# Patient Record
Sex: Female | Born: 1965 | Race: Black or African American | Hispanic: No | Smoking: Former smoker
Health system: Southern US, Community
[De-identification: ages and names within clinical notes are randomized; demographics above are authoritative.]

## PROBLEM LIST (undated history)

## (undated) DIAGNOSIS — J45909 Unspecified asthma, uncomplicated: Secondary | ICD-10-CM

## (undated) DIAGNOSIS — R112 Nausea with vomiting, unspecified: Secondary | ICD-10-CM

## (undated) DIAGNOSIS — J302 Other seasonal allergic rhinitis: Secondary | ICD-10-CM

## (undated) DIAGNOSIS — G43909 Migraine, unspecified, not intractable, without status migrainosus: Secondary | ICD-10-CM

## (undated) DIAGNOSIS — Z9289 Personal history of other medical treatment: Secondary | ICD-10-CM

## (undated) DIAGNOSIS — F419 Anxiety disorder, unspecified: Secondary | ICD-10-CM

## (undated) DIAGNOSIS — L709 Acne, unspecified: Secondary | ICD-10-CM

## (undated) DIAGNOSIS — F32A Depression, unspecified: Secondary | ICD-10-CM

## (undated) DIAGNOSIS — K219 Gastro-esophageal reflux disease without esophagitis: Secondary | ICD-10-CM

## (undated) DIAGNOSIS — D649 Anemia, unspecified: Secondary | ICD-10-CM

## (undated) DIAGNOSIS — Z8489 Family history of other specified conditions: Secondary | ICD-10-CM

## (undated) DIAGNOSIS — E7800 Pure hypercholesterolemia, unspecified: Secondary | ICD-10-CM

## (undated) DIAGNOSIS — F329 Major depressive disorder, single episode, unspecified: Secondary | ICD-10-CM

## (undated) DIAGNOSIS — M889 Osteitis deformans of unspecified bone: Secondary | ICD-10-CM

## (undated) DIAGNOSIS — M797 Fibromyalgia: Secondary | ICD-10-CM

## (undated) DIAGNOSIS — I10 Essential (primary) hypertension: Secondary | ICD-10-CM

## (undated) DIAGNOSIS — J42 Unspecified chronic bronchitis: Secondary | ICD-10-CM

## (undated) DIAGNOSIS — I639 Cerebral infarction, unspecified: Secondary | ICD-10-CM

## (undated) DIAGNOSIS — L910 Hypertrophic scar: Secondary | ICD-10-CM

## (undated) DIAGNOSIS — M1990 Unspecified osteoarthritis, unspecified site: Secondary | ICD-10-CM

## (undated) DIAGNOSIS — Z9889 Other specified postprocedural states: Secondary | ICD-10-CM

## (undated) DIAGNOSIS — Z8673 Personal history of transient ischemic attack (TIA), and cerebral infarction without residual deficits: Secondary | ICD-10-CM

## (undated) HISTORY — DX: Depression, unspecified: F32.A

## (undated) HISTORY — DX: Major depressive disorder, single episode, unspecified: F32.9

## (undated) HISTORY — PX: VAGINAL HYSTERECTOMY: SHX2639

## (undated) HISTORY — DX: Fibromyalgia: M79.7

## (undated) HISTORY — PX: TONSILLECTOMY: SUR1361

## (undated) HISTORY — PX: DIAGNOSTIC LAPAROSCOPY: SUR761

---

## 2000-02-25 HISTORY — PX: KNEE ARTHROSCOPY: SHX127

## 2000-08-20 ENCOUNTER — Encounter: Payer: Self-pay | Admitting: Pulmonary Disease

## 2000-08-20 ENCOUNTER — Ambulatory Visit (HOSPITAL_COMMUNITY): Admission: RE | Admit: 2000-08-20 | Discharge: 2000-08-20 | Payer: Self-pay | Admitting: Pulmonary Disease

## 2001-11-12 ENCOUNTER — Emergency Department (HOSPITAL_COMMUNITY): Admission: EM | Admit: 2001-11-12 | Discharge: 2001-11-12 | Payer: Self-pay | Admitting: Emergency Medicine

## 2002-03-17 ENCOUNTER — Other Ambulatory Visit: Admission: RE | Admit: 2002-03-17 | Discharge: 2002-03-17 | Payer: Self-pay | Admitting: Obstetrics and Gynecology

## 2002-04-21 ENCOUNTER — Ambulatory Visit (HOSPITAL_COMMUNITY): Admission: RE | Admit: 2002-04-21 | Discharge: 2002-04-21 | Payer: Self-pay | Admitting: Obstetrics and Gynecology

## 2002-04-21 ENCOUNTER — Encounter (INDEPENDENT_AMBULATORY_CARE_PROVIDER_SITE_OTHER): Payer: Self-pay

## 2002-04-21 HISTORY — PX: HYSTEROSCOPY W/D&C: SHX1775

## 2002-04-21 HISTORY — PX: HYSTEROSCOPY WITH D & C: SHX1775

## 2003-03-22 ENCOUNTER — Other Ambulatory Visit: Admission: RE | Admit: 2003-03-22 | Discharge: 2003-03-22 | Payer: Self-pay | Admitting: Obstetrics and Gynecology

## 2003-11-01 ENCOUNTER — Inpatient Hospital Stay (HOSPITAL_COMMUNITY): Admission: RE | Admit: 2003-11-01 | Discharge: 2003-11-03 | Payer: Self-pay | Admitting: Obstetrics and Gynecology

## 2003-11-01 ENCOUNTER — Encounter (INDEPENDENT_AMBULATORY_CARE_PROVIDER_SITE_OTHER): Payer: Self-pay | Admitting: Specialist

## 2003-11-01 HISTORY — PX: TOTAL ABDOMINAL HYSTERECTOMY: SHX209

## 2004-06-25 ENCOUNTER — Encounter: Admission: RE | Admit: 2004-06-25 | Discharge: 2004-06-25 | Payer: Self-pay | Admitting: Obstetrics and Gynecology

## 2004-08-08 ENCOUNTER — Ambulatory Visit: Payer: Self-pay | Admitting: Internal Medicine

## 2004-08-15 ENCOUNTER — Ambulatory Visit: Payer: Self-pay | Admitting: Internal Medicine

## 2004-08-28 ENCOUNTER — Ambulatory Visit: Payer: Self-pay | Admitting: Internal Medicine

## 2005-01-12 ENCOUNTER — Encounter: Admission: RE | Admit: 2005-01-12 | Discharge: 2005-01-12 | Payer: Self-pay | Admitting: Obstetrics and Gynecology

## 2005-03-31 ENCOUNTER — Ambulatory Visit: Payer: Self-pay | Admitting: Internal Medicine

## 2005-05-18 ENCOUNTER — Ambulatory Visit: Payer: Self-pay | Admitting: Internal Medicine

## 2005-09-11 ENCOUNTER — Encounter: Admission: RE | Admit: 2005-09-11 | Discharge: 2005-09-11 | Payer: Self-pay | Admitting: Obstetrics and Gynecology

## 2005-09-25 ENCOUNTER — Other Ambulatory Visit: Admission: RE | Admit: 2005-09-25 | Discharge: 2005-09-25 | Payer: Self-pay | Admitting: Obstetrics and Gynecology

## 2005-11-25 ENCOUNTER — Ambulatory Visit: Payer: Self-pay | Admitting: Internal Medicine

## 2005-12-07 ENCOUNTER — Ambulatory Visit: Payer: Self-pay | Admitting: Pulmonary Disease

## 2006-04-30 ENCOUNTER — Encounter (INDEPENDENT_AMBULATORY_CARE_PROVIDER_SITE_OTHER): Payer: Self-pay | Admitting: Specialist

## 2006-04-30 ENCOUNTER — Ambulatory Visit (HOSPITAL_BASED_OUTPATIENT_CLINIC_OR_DEPARTMENT_OTHER): Admission: RE | Admit: 2006-04-30 | Discharge: 2006-04-30 | Payer: Self-pay | Admitting: Otolaryngology

## 2006-04-30 HISTORY — PX: ETHMOIDECTOMY: SHX5197

## 2006-04-30 HISTORY — PX: NASAL TURBINATE REDUCTION: SHX2072

## 2006-04-30 HISTORY — PX: FUNCTIONAL ENDOSCOPIC SINUS SURGERY: SUR616

## 2006-08-06 ENCOUNTER — Ambulatory Visit: Payer: Self-pay | Admitting: Internal Medicine

## 2006-09-06 ENCOUNTER — Ambulatory Visit: Payer: Self-pay | Admitting: Internal Medicine

## 2006-09-06 LAB — CONVERTED CEMR LAB
ALT: 21 units/L (ref 0–40)
BUN: 5 mg/dL — ABNORMAL LOW (ref 6–23)
Bilirubin, Direct: 0.1 mg/dL (ref 0.0–0.3)
CRP, High Sensitivity: 5 (ref 0.00–5.00)
Calcium: 9.2 mg/dL (ref 8.4–10.5)
Direct LDL: 141.6 mg/dL
GFR calc Af Amer: 118 mL/min
GFR calc non Af Amer: 98 mL/min
Glucose, Bld: 108 mg/dL — ABNORMAL HIGH (ref 70–99)
Hemoglobin, Urine: NEGATIVE
Ketones, ur: NEGATIVE mg/dL
Nitrite: NEGATIVE
Potassium: 3.7 meq/L (ref 3.5–5.1)
Total CHOL/HDL Ratio: 5.3
Total Protein: 7.2 g/dL (ref 6.0–8.3)
Triglycerides: 137 mg/dL (ref 0–149)
Urine Glucose: NEGATIVE mg/dL
Urobilinogen, UA: 0.2 (ref 0.0–1.0)
VLDL: 27 mg/dL (ref 0–40)
pH: 6 (ref 5.0–8.0)

## 2006-09-13 ENCOUNTER — Ambulatory Visit: Payer: Self-pay | Admitting: Internal Medicine

## 2006-10-01 ENCOUNTER — Ambulatory Visit: Payer: Self-pay | Admitting: Internal Medicine

## 2006-10-01 ENCOUNTER — Ambulatory Visit (HOSPITAL_COMMUNITY): Admission: RE | Admit: 2006-10-01 | Discharge: 2006-10-01 | Payer: Self-pay | Admitting: Internal Medicine

## 2006-10-22 ENCOUNTER — Encounter: Admission: RE | Admit: 2006-10-22 | Discharge: 2006-10-22 | Payer: Self-pay | Admitting: Orthopedic Surgery

## 2006-12-13 ENCOUNTER — Encounter: Admission: RE | Admit: 2006-12-13 | Discharge: 2006-12-13 | Payer: Self-pay | Admitting: Obstetrics and Gynecology

## 2007-05-23 ENCOUNTER — Ambulatory Visit: Payer: Self-pay | Admitting: Internal Medicine

## 2007-07-01 ENCOUNTER — Encounter: Admission: RE | Admit: 2007-07-01 | Discharge: 2007-07-01 | Payer: Self-pay | Admitting: Neurosurgery

## 2007-07-12 ENCOUNTER — Ambulatory Visit (HOSPITAL_COMMUNITY): Admission: RE | Admit: 2007-07-12 | Discharge: 2007-07-13 | Payer: Self-pay | Admitting: Neurosurgery

## 2007-07-12 HISTORY — PX: LAMINOTOMY: SHX998

## 2007-12-07 ENCOUNTER — Ambulatory Visit: Payer: Self-pay | Admitting: Internal Medicine

## 2007-12-07 DIAGNOSIS — F329 Major depressive disorder, single episode, unspecified: Secondary | ICD-10-CM

## 2007-12-07 DIAGNOSIS — R5383 Other fatigue: Secondary | ICD-10-CM

## 2007-12-07 DIAGNOSIS — J069 Acute upper respiratory infection, unspecified: Secondary | ICD-10-CM | POA: Insufficient documentation

## 2007-12-07 DIAGNOSIS — R519 Headache, unspecified: Secondary | ICD-10-CM | POA: Insufficient documentation

## 2007-12-07 DIAGNOSIS — Z87891 Personal history of nicotine dependence: Secondary | ICD-10-CM | POA: Insufficient documentation

## 2007-12-07 DIAGNOSIS — R51 Headache: Secondary | ICD-10-CM

## 2007-12-07 DIAGNOSIS — M26629 Arthralgia of temporomandibular joint, unspecified side: Secondary | ICD-10-CM

## 2007-12-07 DIAGNOSIS — R5381 Other malaise: Secondary | ICD-10-CM

## 2007-12-12 ENCOUNTER — Ambulatory Visit: Payer: Self-pay | Admitting: Internal Medicine

## 2007-12-13 ENCOUNTER — Encounter: Payer: Self-pay | Admitting: Internal Medicine

## 2007-12-14 ENCOUNTER — Encounter: Payer: Self-pay | Admitting: Internal Medicine

## 2007-12-15 ENCOUNTER — Encounter: Payer: Self-pay | Admitting: Internal Medicine

## 2007-12-20 ENCOUNTER — Telehealth: Payer: Self-pay | Admitting: Internal Medicine

## 2007-12-26 ENCOUNTER — Ambulatory Visit: Payer: Self-pay | Admitting: Internal Medicine

## 2007-12-26 DIAGNOSIS — E7800 Pure hypercholesterolemia, unspecified: Secondary | ICD-10-CM

## 2007-12-27 LAB — CONVERTED CEMR LAB
ALT: 17 units/L (ref 0–35)
Basophils Absolute: 0 10*3/uL (ref 0.0–0.1)
Basophils Relative: 0.1 % (ref 0.0–1.0)
Bilirubin, Direct: 0.1 mg/dL (ref 0.0–0.3)
CO2: 30 meq/L (ref 19–32)
Calcium: 9.3 mg/dL (ref 8.4–10.5)
Cholesterol: 224 mg/dL (ref 0–200)
Creatinine, Ser: 0.7 mg/dL (ref 0.4–1.2)
Direct LDL: 158.3 mg/dL
Eosinophils Absolute: 0.1 10*3/uL (ref 0.0–0.7)
GFR calc Af Amer: 117 mL/min
HCT: 36.6 % (ref 36.0–46.0)
Hemoglobin: 12.5 g/dL (ref 12.0–15.0)
MCHC: 34.3 g/dL (ref 30.0–36.0)
MCV: 85.4 fL (ref 78.0–100.0)
Monocytes Absolute: 0.6 10*3/uL (ref 0.1–1.0)
Neutro Abs: 3.6 10*3/uL (ref 1.4–7.7)
RBC: 4.28 M/uL (ref 3.87–5.11)
RDW: 15.5 % — ABNORMAL HIGH (ref 11.5–14.6)
Sodium: 141 meq/L (ref 135–145)
TSH: 0.7 microintl units/mL (ref 0.35–5.50)
Total Bilirubin: 0.7 mg/dL (ref 0.3–1.2)
VLDL: 21 mg/dL (ref 0–40)

## 2008-03-27 ENCOUNTER — Encounter: Payer: Self-pay | Admitting: Internal Medicine

## 2009-03-05 ENCOUNTER — Encounter: Payer: Self-pay | Admitting: Internal Medicine

## 2009-08-20 ENCOUNTER — Ambulatory Visit: Payer: Self-pay | Admitting: Internal Medicine

## 2009-08-20 ENCOUNTER — Encounter: Admission: RE | Admit: 2009-08-20 | Discharge: 2009-08-20 | Payer: Self-pay | Admitting: Internal Medicine

## 2009-08-20 DIAGNOSIS — R109 Unspecified abdominal pain: Secondary | ICD-10-CM | POA: Insufficient documentation

## 2009-08-20 DIAGNOSIS — K219 Gastro-esophageal reflux disease without esophagitis: Secondary | ICD-10-CM

## 2009-08-21 LAB — CONVERTED CEMR LAB
ALT: 112 units/L — ABNORMAL HIGH (ref 0–35)
Albumin: 3.9 g/dL (ref 3.5–5.2)
Alkaline Phosphatase: 112 units/L (ref 39–117)
Basophils Relative: 0.5 % (ref 0.0–3.0)
Bilirubin Urine: NEGATIVE
Bilirubin, Direct: 0.1 mg/dL (ref 0.0–0.3)
CO2: 30 meq/L (ref 19–32)
Calcium: 9.3 mg/dL (ref 8.4–10.5)
Chloride: 107 meq/L (ref 96–112)
Creatinine, Ser: 0.8 mg/dL (ref 0.4–1.2)
Eosinophils Relative: 1.1 % (ref 0.0–5.0)
Hemoglobin, Urine: NEGATIVE
Hemoglobin: 12.5 g/dL (ref 12.0–15.0)
Ketones, ur: NEGATIVE mg/dL
Leukocytes, UA: NEGATIVE
Lymphocytes Relative: 38.3 % (ref 12.0–46.0)
MCV: 89.3 fL (ref 78.0–100.0)
Neutro Abs: 2.7 10*3/uL (ref 1.4–7.7)
Neutrophils Relative %: 51.6 % (ref 43.0–77.0)
RBC: 4.04 M/uL (ref 3.87–5.11)
Sodium: 141 meq/L (ref 135–145)
Total Protein: 6.9 g/dL (ref 6.0–8.3)
Urine Glucose: NEGATIVE mg/dL
Urobilinogen, UA: 0.2 (ref 0.0–1.0)
WBC: 5.2 10*3/uL (ref 4.5–10.5)

## 2009-08-27 ENCOUNTER — Observation Stay (HOSPITAL_COMMUNITY): Admission: RE | Admit: 2009-08-27 | Discharge: 2009-08-28 | Payer: Self-pay | Admitting: Surgery

## 2009-08-27 ENCOUNTER — Encounter (INDEPENDENT_AMBULATORY_CARE_PROVIDER_SITE_OTHER): Payer: Self-pay | Admitting: Surgery

## 2009-08-27 HISTORY — PX: CHOLECYSTECTOMY: SHX55

## 2010-02-17 ENCOUNTER — Ambulatory Visit: Payer: Self-pay | Admitting: Internal Medicine

## 2010-02-17 DIAGNOSIS — S335XXA Sprain of ligaments of lumbar spine, initial encounter: Secondary | ICD-10-CM

## 2010-02-26 ENCOUNTER — Emergency Department (HOSPITAL_COMMUNITY): Admission: EM | Admit: 2010-02-26 | Discharge: 2010-02-27 | Payer: Self-pay | Admitting: Emergency Medicine

## 2010-03-05 ENCOUNTER — Telehealth: Payer: Self-pay | Admitting: Internal Medicine

## 2010-03-05 ENCOUNTER — Ambulatory Visit: Payer: Self-pay | Admitting: Internal Medicine

## 2010-03-05 ENCOUNTER — Telehealth (INDEPENDENT_AMBULATORY_CARE_PROVIDER_SITE_OTHER): Payer: Self-pay | Admitting: *Deleted

## 2010-03-06 ENCOUNTER — Ambulatory Visit (HOSPITAL_COMMUNITY): Admission: RE | Admit: 2010-03-06 | Discharge: 2010-03-06 | Payer: Self-pay | Admitting: Internal Medicine

## 2010-03-27 ENCOUNTER — Ambulatory Visit (HOSPITAL_COMMUNITY): Admission: RE | Admit: 2010-03-27 | Discharge: 2010-03-28 | Payer: Self-pay | Admitting: Neurosurgery

## 2010-03-27 HISTORY — PX: LAMINOTOMY: SHX998

## 2010-04-28 ENCOUNTER — Ambulatory Visit: Payer: Self-pay | Admitting: Internal Medicine

## 2010-04-28 DIAGNOSIS — J310 Chronic rhinitis: Secondary | ICD-10-CM | POA: Insufficient documentation

## 2010-05-04 ENCOUNTER — Inpatient Hospital Stay (HOSPITAL_COMMUNITY): Admission: EM | Admit: 2010-05-04 | Discharge: 2010-05-08 | Payer: Self-pay | Admitting: Emergency Medicine

## 2010-05-13 ENCOUNTER — Encounter: Admission: RE | Admit: 2010-05-13 | Discharge: 2010-05-13 | Payer: Self-pay | Admitting: Neurosurgery

## 2010-05-16 ENCOUNTER — Encounter: Admission: RE | Admit: 2010-05-16 | Discharge: 2010-05-16 | Payer: Self-pay | Admitting: Neurosurgery

## 2010-05-22 ENCOUNTER — Encounter: Admission: RE | Admit: 2010-05-22 | Discharge: 2010-05-22 | Payer: Self-pay | Admitting: Neurosurgery

## 2010-06-10 ENCOUNTER — Encounter: Admission: RE | Admit: 2010-06-10 | Discharge: 2010-06-10 | Payer: Self-pay | Admitting: Neurosurgery

## 2010-06-27 ENCOUNTER — Encounter: Admission: RE | Admit: 2010-06-27 | Discharge: 2010-06-27 | Payer: Self-pay | Admitting: Neurosurgery

## 2010-07-18 ENCOUNTER — Ambulatory Visit (HOSPITAL_COMMUNITY)
Admission: RE | Admit: 2010-07-18 | Discharge: 2010-07-21 | Payer: Self-pay | Source: Home / Self Care | Attending: Neurosurgery | Admitting: Neurosurgery

## 2010-07-18 HISTORY — PX: LUMBAR FUSION: SHX111

## 2010-08-21 ENCOUNTER — Other Ambulatory Visit: Payer: Self-pay | Admitting: Obstetrics and Gynecology

## 2010-08-28 NOTE — Assessment & Plan Note (Signed)
Summary: Primary svc/ new onset RUQ Pain    Primary Provider/Referring Provider:  Sherene Sires  CC:  Acute visit.  Pt c/o sharp pain under right breast x 2 wks.  She states that the pain radiates around to her back and shoulder blade.  She states that the pain usually only occurs at night.  Pain worsens when she takes a deep breath.  She also c/o nausea and increased acid reflux symptoms..  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    August 20, 2009 Acute visit.  Pt c/o continuous sharp pain under right breast x 2 wks.  She states that the pain radiates around to her back and shoulder blade.  She states that the pain worse lying down.  Pain worsens when she takes a deep breath.  She also c/o nausea and increased acid reflux symptoms. No cough.   Pain worse with greasy foods, stools less brown. Pt denies any significant sore throat, dysphagia, itching, sneezing,  nasal congestion or excess secretions,  fever, chills, sweats, unintended wt loss, pleuritic or exertional cp, hempoptysis, change in activity tolerance  orthopnea pnd or leg swelling. Pt also denies any obvious fluctuation in symptoms with weather or environmental change or other alleviating or aggravating factors.             Current Medications (verified): 1)  Omeprazole 20 Mg  Cpdr (Omeprazole) .... Take One 30-60 Min Before First and Last Meals of The Day Until Better 2)  Tramadol Hcl 50 Mg  Tabs (Tramadol Hcl) .... One To Two By Mouth Every 4-6 Hours 3)  Zyrtec Allergy 10 Mg  Tabs (Cetirizine Hcl) .... Once Daily As Needed 4)  Paxil 20 Mg Tabs (Paroxetine Hcl) .Marland Kitchen.. 1 Once Daily 5)  Estrace 1 Mg Tabs (Estradiol) .Marland Kitchen.. 1 Once Daily  Allergies (verified): 1)  ! Tylox  Past History:  Past Medical History: DEPRESSION (ICD-311) FATIGUE (ICD-780.79) HEADACHE, CHRONIC  (ICD-784.0) Dr. Madelon Lips follow-up TOBACCO ABUSE, HX OF (ICD-V15.82) quit smoking 2005 TMJ PAIN (ICD-524.62) OBESITY (ICD-278.00)  peak 233 8/08  target 179  ideal 161 Sinusitis sinus surgery 10/07 Dr. Narda Bonds RUQ Pain    - U/S ABD August 20, 2009 >     Family History: suicide in her father depression in her mother ovarian cancer in maternal grandmother hypertension and CHF in brother 47 y younger gallstones mother  Social History: Quit smoking Dec 2004 2 children living at home  Vital Signs:  Patient profile:   45 year old female Weight:      219 pounds BMI:     35.48 O2 Sat:      97 % on Room air Temp:     99.0 degrees F oral Pulse rate:   102 / minute BP sitting:   114 / 72  (right arm)  Vitals Entered By: Vernie Murders (August 20, 2009 10:25 AM)  O2 Flow:  Room air  Physical Exam  Additional Exam:  Obese amb bf nad, somber with hopeless/helpless affect wt 218 > 219 August 20, 2009  HEENT: nl dentition, turbinates, and orophanx. Nl external ear canals without cough reflex Neck without JVD/Nodes/TM  normal carotids without bruit Lungs clear to A and P bilaterally without cough on insp or exp maneuvers RRR no s3 or murmur or increase in P2 no displacement of PMI Abd  mild ruq tenderness plus/minus murphy's  no guarding or rebound Ext warm without calf tenderness, cyanosis clubbing or edema Skin warm and dry without lesions  except for small keloid like area under chin  Abd u/s 1.  Cholelithiasis.  Positive sonographic Murphy's sign. 2.  Echogenic pancreas which may reflect diffuse fatty infiltration.  No focal pancreatic parenchymal abnormality. 3.  Otherwise normal abdominal ultrasound.    CXR  Procedure date:  08/20/2009  Findings:       Comparison: Chest x-ray of 12/26/2007   Findings: The lungs are clear.  Mediastinal contours are normal. The heart is within upper limits of normal.  No acute bony abnormality is seen.   IMPRESSION: Stable  chest x-ray.  No active lung disease.  Impression & Recommendations:  Problem # 1:  ABDOMINAL PAIN, UNSPECIFIED (ICD-789.00) Multiple risk factors for gb dz, pursue dx with u/s > positive gallstones with murphy's sign, will refer to General surgery  Orders: Est. Patient Level IV ([address]) TLB-BMP (Basic Metabolic Panel-BMET) (80048-METABOL) TLB-CBC Platelet - w/Differential (85025-CBCD) TLB-Hepatic/Liver Function Pnl (80076-HEPATIC) TLB-Udip ONLY (81003-UDIP) Misc. Referral (Misc. Ref) T-2 View CXR (71020TC) Surgical Referral (Surgery)  Problem # 2:  GERD (ICD-530.81)  updated medication list for this problem includes:    Omeprazole 20 Mg Cpdr (Omeprazole) .Marland Kitchen... Take one 30-60 min before first and last meals of the day until better whenever having any abd or respiratory flares.  Medications Added to Medication List This Visit: 1)  Tramadol Hcl 50 Mg Tabs (Tramadol hcl) .... One to two by mouth every 4-6 hours for pain 2)  Paxil 20 Mg Tabs (Paroxetine hcl) .Marland Kitchen.. 1 once daily 3)  Estrace 1 Mg Tabs (Estradiol) .Marland Kitchen.. 1 once daily  Patient Instructions: 1)  Prilosec Take one 30-60 min before first and last meals of the day whenever coughing or abdominal promblems 2)  GERD (REFLUX)  is a common cause of respiratory symptoms. It commonly presents without heartburn and can be treated with medication, but also with lifestyle changes including avoidance of late meals, excessive alcohol, smoking cessation, and avoid fatty foods, chocolate, peppermint, colas, red wine, and acidic juices such as orange juice. NO MINT OR MENTHOL PRODUCTS SO NO COUGH DROPS  3)  USE SUGARLESS CANDY INSTEAD (jolley ranchers)  4)  NO OIL BASED VITAMINS 5)  No greasy foods 6)  See Patient Care Coordinator before leaving for for abd ultrasound Prescriptions: TRAMADOL HCL 50 MG  TABS (TRAMADOL HCL) One to two by mouth every 4-6 hours for pain  #40 x 0   Entered and Authorized by:   Nyoka Cowden MD   Signed by:   Nyoka Cowden MD on 08/20/2009   Method used:   Electronically to        [facility]  [phone]* (retail)       [address]       [place], [place]  [address]       Ph: [phone] or [phone]       Fax: [phone]   RxID:   [id number]

## 2010-08-28 NOTE — Progress Notes (Signed)
Summary: pharm call  Phone Note From Pharmacy Call back at [phone]   Caller: [facility]- [place] Call For: Wanda [name]  Summary of Call: need to verify Mobic rx instructions and that pt needs Susp. Initial call taken by: Eugene Gavia,  March 05, 2010 10:04 AM  Follow-up for Phone Call        Spoke with [name].  needs clarrification on Mobic rx -- ? if this is supposed to be susp or pill.  If susp, pls advise on mL.  If pill, pls advise strength.  Will forward to MW--pls advise.  Thanks! Follow-up by: Gweneth Dimitri RN,  March 05, 2010 10:30 AM  Additional Follow-up for Phone Call Additional follow up Details #1::        sorry it was meant to be rx  for the tablet  Additional Follow-up by: Nyoka Cowden MD,  March 05, 2010 12:41 PM    Additional Follow-up for Phone Call Additional follow up Details #2::    update with pharmacy.Reynaldo Minium CMA  March 05, 2010 2:25 PM

## 2010-08-28 NOTE — Progress Notes (Signed)
Summary: handicap parking note > ok  Phone Note Call from Patient   Caller: Patient Call For: wert Summary of Call: pt was just seen. forget to request a note that she can take to get a handicap placard for parking. call [phone] and she will pick this up Initial call taken by: Tivis Ringer, CNA,  March 05, 2010 9:59 AM  Follow-up for Phone Call        Dr. Sherene Sires, pls advise if ok for pt to have a handicap placard.  Gweneth Dimitri RN  March 05, 2010 10:33 AM ok Follow-up by: Nyoka Cowden MD,  March 05, 2010 12:41 PM  Additional Follow-up for Phone Call Additional follow up Details #1::        Message and papers are in MW's lookat to sign and then call pt to pick up.Reynaldo Minium CMA  March 05, 2010 2:24 PM     Additional Follow-up for Phone Call Additional follow up Details #2::    done Follow-up by: Nyoka Cowden MD,  March 05, 2010 4:03 PM

## 2010-08-28 NOTE — Assessment & Plan Note (Signed)
Summary: Primary svc/ recurrent cough   Primary Provider/Referring Provider:  Sherene Sires  CC:  Acute visit.  Pt c/o cough x 2 days- dry and hacky.  She states that this all started with nasal congestion.  C/o back pain from cough..  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    August 20, 2009 Acute visit.  Pt c/o continuous sharp pain under right breast x 2 wks.  She states that the pain radiates around to her back and shoulder blade.  She states that the pain worse lying down.  Pain worsens when she takes a deep breath.  She also c/o nausea and increased acid reflux symptoms. No cough.   Pain worse with greasy foods, stools less brown.   GBectomy 08/28/09  February 17, 2010 Acute visit.  Pt c/o back pain x 2 days- started when she turned to get out of the car.  Pain is mainly in her lower back- rt side- tramadol for pain not helping.  acute onset while sitting and twisting before standing up no radicular features. no dysuria, fever. no better with advil rec Heating pad Mobic 7.5 twice daily with meals for a week then take only as needed (like advil so don't use over the counters) Percocet one every 4 hours if need for severe pain > right sided resolved then after mobic stopped began having pain down left side to toes and numbness > ER 8/3 rx valium / percocet   March 05, 2010 Acute visit.  Pt c/o back pain worsening x 1 wk now shifted She states pain has moved to her left side- radiates down left leg with numbness. She states there is no feeling in left leg.  worse with cough. no fever or abd pain.  still ambulating but using a cane. has referral to NS for 8/15  03/27/10 Back surgery [place]  April 28, 2010 Acute visit.  Pt c/o cough x 2 days- dry and hacky.  She states that this all started with nasal congestion.  C/o back pain from cough.  not using nasonex but did start afrin.  no purulent secretions. Pt denies any significant sore throat, dysphagia, itching, sneezing, fever, chills, sweats, unintended wt loss, pleuritic or exertional cp, hempoptysis, change in activity tolerance  orthopnea pnd or leg swelling.  Current Medications (verified): 1)  Omeprazole 20 Mg  Cpdr (Omeprazole) .... Take One 30-60 Min Before First and Last Meals of The Day Until Better 2)  Tramadol Hcl 50 Mg  Tabs (Tramadol Hcl) .... One To Two By Mouth Every 4-6 Hours For Pain 3)  Zyrtec Allergy 10 Mg  Tabs (Cetirizine Hcl) .... Once Daily As Needed 4)  Paxil 10 Mg Tabs (Paroxetine Hcl) .Marland Kitchen.. 1 Once Daily 5)  Estrace 1 Mg Tabs (Estradiol) .Marland Kitchen.. 1 Once Daily 6)  Relefen 500 Mg .Marland Kitchen.. 1 Two Times A Day 7)  Dilaudid 2 Mg Tabs (Hydromorphone Hcl) .Marland Kitchen.. 1 Once Daily As Needed 8)  Afrin Nasal Spray 0.05 % Soln (Oxymetazoline Hcl) .... Per Bottle Instructions- Do Not Use More Than 5 Days in A Row  Allergies: 1)  ! Tylox 2)  ! Percocet  Past History:  Past Medical History: DEPRESSION (ICD-311) FATIGUE (ICD-780.79) HEADACHE, CHRONIC (ICD-784.0) Dr. Madelon Lips follow-up TOBACCO ABUSE, HX OF (ICD-V15.82) quit smoking 2005 TMJ PAIN (ICD-524.62) OBESITY (ICD-278.00)  peak 233 8/08  target 179  ideal 161 Sinusitis sinus surgery 10/07 Dr. Narda Bonds RUQ Pain    - U/S ABD August 20, 2009 > pos gallstones > Lap chole 08/28/09 Low Back strain 02/16/10 ...................................................................Marland KitchenKritzer    - Radicular features Left began 8/2 sudden onset > rx prednisone dose pack March 05, 2010     - MRI ordered March 05, 2010>>> Pos for nerve compression    - NS eval scheduled for 03/10/10 > Laminectomy 03/27/10 Health Maintaince.....................................................................Marland KitchenSherene Sires     - CPX May 26 2010 due    Past Surgical History: 4/05status post total abdominal hysterectomy but still has ovaries back surgery Dec 08 and  03/27/10  Lap chole 08/28/09  Vital Signs:  Patient profile:   45 year old female Weight:      222.38 pounds O2 Sat:      99 % on Room air Temp:     98.0 degrees F oral Pulse rate:   102 / minute BP sitting:   130 / 84  (left arm) Cuff size:   large  Vitals Entered By: Vernie Murders (April 28, 2010 2:43 PM)  O2 Flow:  Room air  Physical Exam  Additional Exam:  Obese amb bf nad, somber with hopeless/helpless affect wt 218 > 219 August 20, 2009 > 218 February 17, 2010 > 219 March 05, 2010 >  222 April 29, 2010  HEENT: nl dentition,mod bil nonspecific swelling of both  turbinates, and nl  orophanx. Nl external ear canals without cough reflex Neck without JVD/Nodes/TM  normal carotids without bruit Lungs clear to A and P bilaterally without cough on insp or exp maneuvers RRR no s3 or murmur or increase in P2 no displacement of PMI Abd  mild ruq tenderness plus/minus murphy's no guarding or rebound Ext warm without calf tenderness, cyanosis clubbing or edema Skin warm and dry without lesions  except for small keloid like area under chin    Impression & Recommendations:  Problem # 1:  URI, ACUTE (ICD-465.9)  The following medications were removed from the medication list:    Mobic 7.5 Mg/30ml Susp (Meloxicam) ..... One twice daily x 2 weeks then as needed Her updated medication list for this problem includes:    Zyrtec Allergy 10 Mg Tabs (Cetirizine hcl) ..... Once daily as needed  Explained natural h/o uri and GERD/ cyclical cough  and why it's necessary in patients at risk to rx short term with PPI to reduce risk of evolving cyclical cough triggered by epithelial injury and a heightened sensitivty to the effects of any upper airway irritants,  most importantly acid - related    Problem # 2:  CHRONIC RHINITIS (ICD-472.0)  See instructions for specific recommendations   Orders: Est. Patient Level III ([address])  Medications Added to Medication List This Visit: 1)  Paxil 10 Mg  Tabs (Paroxetine hcl) .Marland Kitchen.. 1 once daily 2)  Relefen 500 Mg  .Marland Kitchen.. 1 two times a day 3)  Dilaudid 2 Mg Tabs (Hydromorphone hcl) .Marland Kitchen.. 1 once daily as needed 4)  Afrin Nasal Spray 0.05 % Soln (Oxymetazoline hcl) .... Per bottle instructions- do not use more than 5 days in a row 5)  Nasonex 50 Mcg/act Susp (Mometasone furoate) .... Two puffs each nostril twice daily 6)  Prednisone 10 Mg Tabs (Prednisone) .... 4 each am x 2days, 2x2days, 1x2days and stop 7)  Tramadol Hcl 50 Mg Tabs (Tramadol hcl) .... One to two by mouth every 4-6 hours if needed  Patient Instructions: 1)  Nasonex has  no immediate benefit in terms of improving symptoms.  To help them reached the target tissue, the patient should use Afrin two puffs every 12 hours applied one min before using the nasal steroids.  Afrin should be stopped after no more than 5 days.  If the symptoms worsen, Afrin can be restarted after 5 days off of therapy to prevent rebound congestion from overuse of Afrin.  I also emphasized that in no way are nasal steroids a concern in terms of "addiction". 2)   Take delsym two tsp every 12 hours and add tramadol 50 mg up to every 4 hours to suppress the urge to cough. Swallowing water or using ice chips/non mint and menthol containing candies (such as lifesavers or sugarless jolly ranchers) are also effective.  3)  GERD (REFLUX)  is a common cause of respiratory symptoms. It commonly presents without heartburn and can be treated with medication, but also with lifestyle changes including avoidance of late meals, excessive alcohol, smoking cessation, and avoid fatty foods, chocolate, peppermint, colas, red wine, and acidic juices such as orange juice. NO MINT OR MENTHOL PRODUCTS SO NO COUGH DROPS  4)  USE SUGARLESS CANDY INSTEAD (jolley ranchers)  5)  NO OIL BASED VITAMINS  6)  Prednisone 4 each am x 2days, 2x2days, 1x2days and stop  Prescriptions: TRAMADOL HCL 50 MG  TABS (TRAMADOL HCL) One to two by mouth every 4-6  hours if needed  #40 x 0   Entered and Authorized by:   Nyoka Cowden MD   Signed by:   Nyoka Cowden MD on 04/28/2010   Method used:   [facility]        [facility]       [address]       [place], [place]  [address]       Ph: [phone]       Fax: [phone]   RxID:   [id number] PREDNISONE 10 MG  TABS (PREDNISONE) 4 each am x 2days, 2x2days, 1x2days and stop  #14 x 0   Entered and Authorized by:   Nyoka Cowden MD   Signed by:   Nyoka Cowden MD on 04/28/2010   Method used:   Electronically to        [facility] (retail)       [address]       [place], [place]  [address]       Ph: [phone]       Fax: [phone]   RxID:   [id number] NASONEX 50 MCG/ACT  SUSP (MOMETASONE FUROATE) Two puffs each nostril twice daily  #1 x 11   Entered and Authorized by:   Nyoka Cowden MD   Signed by:   Nyoka Cowden MD on 04/28/2010   Method used:   Electronically to        [facility] (retail)       [address]       [place], [place]  [address]       Ph: [phone]       Fax: [phone]   RxID:   [id number]

## 2010-08-28 NOTE — Assessment & Plan Note (Signed)
Summary: Primary svc/ low back pain without radicular features   Primary Provider/Referring Provider:  Sherene Sires  CC:  Acute visit.  Pt c/o back pain x 2 days- started when she turned to get out of the car.  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    August 20, 2009 Acute visit.  Pt c/o continuous sharp pain under right breast x 2 wks.  She states that the pain radiates around to her back and shoulder blade.  She states that the pain worse lying down.  Pain worsens when she takes a deep breath.  She also c/o nausea and increased acid reflux symptoms. No cough.   Pain worse with greasy foods, stools less brown.   GBectory 08/28/09  February 17, 2010 Acute visit.  Pt c/o back pain x 2 days- started when she turned to get out of the car.  Pain is mainly in her lower back- rt side- tramadol for pain not helping.  acute onset while sitting and twisting before standing up no radicular features. no dysuria, fever. no better with advil  Current Medications (verified): 1)  Omeprazole 20 Mg  Cpdr (Omeprazole) .... Take One 30-60 Min Before First and Last Meals of The Day Until Better 2)  Tramadol Hcl 50 Mg  Tabs (Tramadol Hcl) .... One To Two By Mouth Every 4-6 Hours For Pain 3)  Zyrtec Allergy 10 Mg  Tabs (Cetirizine Hcl) .... Once Daily As Needed 4)  Paxil 20 Mg Tabs (Paroxetine Hcl) .Marland Kitchen.. 1 Once Daily 5)  Estrace 1 Mg Tabs (Estradiol) .Marland Kitchen.. 1 Once Daily  Allergies (verified): 1)  ! Tylox  Past History:  Past Medical History: DEPRESSION (ICD-311) FATIGUE (ICD-780.79) HEADACHE, CHRONIC (ICD-784.0) Dr. Madelon Lips follow-up TOBACCO ABUSE, HX OF (ICD-V15.82) quit smoking 2005 TMJ PAIN (ICD-524.62) OBESITY (ICD-278.00)  peak 233 8/08  target 179  ideal 161 Sinusitis sinus  surgery 10/07 Dr. Narda Bonds RUQ Pain    - U/S ABD August 20, 2009 > pos gallstones > Lap chole 08/28/09 Low Back strain 02/16/10    Vital Signs:  Patient profile:   45 year old female Weight:      218 pounds O2 Sat:      98 % on Room air Temp:     97.8 degrees F oral Pulse rate:   87 / minute BP sitting:   110 / 78  (left arm)  Vitals Entered By: Vernie Murders (February 17, 2010 12:04 PM)  O2 Flow:  Room air  Physical Exam  Additional Exam:  Obese amb bf nad, somber with hopeless/helpless affect wt 218 > 219 August 20, 2009 > 218 February 17, 2010  HEENT: nl dentition, turbinates, and orophanx. Nl external ear canals without cough reflex Neck without JVD/Nodes/TM  normal carotids without bruit Lungs clear to A and P bilaterally without cough on insp or exp maneuvers RRR no s3 or murmur or increase in P2 no displacement of PMI Abd  mild ruq tenderness plus/minus murphy's no guarding or rebound Ext warm without calf tenderness, cyanosis clubbing or edema Skin warm and dry without lesions  except for small keloid like area under chin MS Pos bilateral parasacral tenderness, worse pain with L SLR maneuver    Impression & Recommendations:  Problem # 1:  LUMBAR SPRAIN AND  STRAIN (ICD-847.2)  no radicular features. See instructions for specific recommendations   f/u ortho eval as needed depending on response to rx  Medications Added to Medication List This Visit: 1)  Mobic 7.5 Mg Tabs (Meloxicam) .... One twice daily x 1 week with means then use as neede for stiffness/ joint pain 2)  Percocet 10-650 Mg Tabs (Oxycodone-acetaminophen) .... One every 4 hours if need for back pain  Other Orders: Est. Patient Level III ([address])  Patient Instructions: 1)  Heating pad 2)  Mobic 7.5 twice daily with meals for a week then take only as needed (like advil so don't use over the counters) 3)  Percocet one every 4 hours if need for severe pain Prescriptions: PERCOCET 10-650 MG TABS  (OXYCODONE-ACETAMINOPHEN) one every 4 hours if need for back pain  #30 x 0   Entered and Authorized by:   Nyoka Cowden MD   Signed by:   Nyoka Cowden MD on 02/17/2010   Method used:   Print then Give to Patient   RxID:   [id number] MOBIC 7.5 MG TABS (MELOXICAM) one twice daily x 1 week with means then use as neede for stiffness/ joint pain  #30 x 11   Entered and Authorized by:   Nyoka Cowden MD   Signed by:   Nyoka Cowden MD on 02/17/2010   Method used:   Electronically to        [facility]  [phone]* (retail)       [address]       [place], [place]  [address]       Ph: [phone] or [phone]       Fax: [phone]   RxID:   [id number]

## 2010-08-28 NOTE — Assessment & Plan Note (Signed)
Summary: Primary svc/ L5/S1 radiculopathy on L > rx pred/mobic/ MRI   Primary Provider/Referring Provider:  Sherene Sires  CC:  Acute visit.  Pt c/o back pain worsening x 1 wk.  She states pain has moved to her left side- radiates down left leg with numbness. She states there is no feeling in left leg. Wanda [name]  History of Present Illness:  45 year-old black female, quit smoking 2005,  with a history of recurrent cyclical cough associated with sinus disease, status post most recent sinus surgery October, 2007.    August 20, 2009 Acute visit.  Pt c/o continuous sharp pain under right breast x 2 wks.  She states that the pain radiates around to her back and shoulder blade.  She states that the pain worse lying down.  Pain worsens when she takes a deep breath.  She also c/o nausea and increased acid reflux symptoms. No cough.   Pain worse with greasy foods, stools less brown.   GBectory 08/28/09  February 17, 2010 Acute visit.  Pt c/o back pain x 2 days- started when she turned to get out of the car.  Pain is mainly in her lower back- rt side- tramadol for pain not helping.  acute onset while sitting and twisting before standing up no radicular features. no dysuria, fever. no better with advil rec Heating pad Mobic 7.5 twice daily with meals for a week then take only as needed (like advil so don't use over the counters) Percocet one every 4 hours if need for severe pain > right sided resolved then after mobic stopped began having pain down left side to toes and numbness > ER 8/3 rx valium / percocet   March 05, 2010 Acute visit.  Pt c/o back pain worsening x 1 wk now shifted She states pain has moved to her left side- radiates down left leg with numbness. She states there is no feeling in left leg.  worse with cough. no fever or abd pain.  still ambulating but using a cane. has referral to NS for 8/15  Current Medications (verified): 1)  Omeprazole 20 Mg  Cpdr (Omeprazole) .... Take One 30-60 Min Before First and  Last Meals of The Day Until Better 2)  Tramadol Hcl 50 Mg  Tabs (Tramadol Hcl) .... One To Two By Mouth Every 4-6 Hours For Pain 3)  Zyrtec Allergy 10 Mg  Tabs (Cetirizine Hcl) .... Once Daily As Needed 4)  Paxil 20 Mg Tabs (Paroxetine Hcl) .Wanda [name].. 1 Once Daily 5)  Estrace 1 Mg Tabs (Estradiol) .Wanda [name].. 1 Once Daily  Allergies (verified): 1)  ! Tylox  Past History:  Past Medical History: DEPRESSION (ICD-311) FATIGUE (ICD-780.79) HEADACHE, CHRONIC (ICD-784.0) Dr. Madelon Lips follow-up TOBACCO ABUSE, HX OF (ICD-V15.82) quit smoking 2005 TMJ PAIN (ICD-524.62) OBESITY (ICD-278.00)  peak 233 8/08  target 179  ideal 161 Sinusitis sinus surgery 10/07 Dr. Narda Bonds RUQ Pain    - U/S ABD August 20, 2009 > pos gallstones > Lap chole 08/28/09 Low Back strain 02/16/10 ...................................................................Wanda KitchenKritzer    - Radicular features Left began 8/2 sudden onset > rx prednisone dose pack March 05, 2010     - MRI ordered March 05, 2010>>>    - NS eval scheduled for 03/10/10    Vital Signs:  Patient profile:   45 year old female Weight:      219.50 pounds O2 Sat:      97 % on Room air Temp:     97.7 degrees F oral Pulse rate:  102 / minute BP sitting:   132 / 84  (left arm)  Vitals Entered By: Vernie Murders (March 05, 2010 9:03 AM)  O2 Flow:  Room air  Physical Exam  Additional Exam:  Obese amb bf nad, somber with hopeless/helpless affect wt 218 > 219 August 20, 2009 > 218 February 17, 2010 > 219 March 05, 2010  HEENT: nl dentition, turbinates, and orophanx. Nl external ear canals without cough reflex Neck without JVD/Nodes/TM  normal carotids without bruit Lungs clear to A and P bilaterally without cough on insp or exp maneuvers RRR no s3 or murmur or increase in P2 no displacement of PMI Abd  mild ruq tenderness plus/minus murphy's no guarding or rebound Ext warm without calf tenderness, cyanosis clubbing or edema Skin warm and dry without lesions  except  for small keloid like area under chin MS Pos bilateral parasacral tenderness,  much worse pain with L SLR maneuver  Unable to bear full wt on left leg for toe/ heel walking.     Impression & Recommendations:  Problem # 1:  LUMBAR SPRAIN AND STRAIN (ICD-847.2) Now has radiuclar features Lumbar disc L5/S1 on Left which flared after finished  mobic and since then just treating the pain with opioids which I strongly discouraged.  Therefore needs MRI/ NS eval  See instructions for specific recommendations   Medications Added to Medication List This Visit: 1)  Tramadol Hcl 50 Mg Tabs (Tramadol hcl) .... One to two by mouth every 4-6 hours for pain 2)  Mobic 7.5 Mg/19ml Susp (Meloxicam) .... One twice daily x 2 weeks then as needed 3)  Prednisone 10 Mg Tabs (Prednisone) .... 4 each am x 2days, 2x2days, 1x2days and stop  Other Orders: Misc. Referral (Misc. Ref) Est. Patient Level IV (16109)  Patient Instructions: 1)  Prednisone 4 each am x 2days, 2x2days, 1x2days and stop  2)  Mobic (meloxicam) twice daily up to 2 weeks as long as still requiring pain medicaiton 3)  for pain tramadol up to 2 every 4 hours 4)  See Patient Care Coordinator before leaving for MRI of Spine Prescriptions: TRAMADOL HCL 50 MG  TABS (TRAMADOL HCL) One to two by mouth every 4-6 hours for pain  #40 x 0   Entered and Authorized by:   Nyoka Cowden MD   Signed by:   Nyoka Cowden MD on 03/05/2010   Method used:   Electronically to        [facility]  [phone]* (retail)       [address]       [place], [place]  [address]       Ph: [phone] or [phone]       Fax: [phone]   RxID:   [id number] PREDNISONE 10 MG  TABS (PREDNISONE) 4 each am x 2days, 2x2days, 1x2days and stop  #14 x 0   Entered and Authorized by:   Nyoka Cowden MD   Signed by:   Nyoka Cowden MD on 03/05/2010   Method used:   Electronically to        [facility]  [phone]*  (retail)       [address]       [place], [place]  [address]       Ph: [phone] or [phone]       Fax: [phone]   RxID:   [id number] MOBIC 7.5  MG/5ML SUSP (MELOXICAM) one twice daily x 2 weeks then as needed  #30 x 3   Entered and Authorized by:   Nyoka Cowden MD   Signed by:   Nyoka Cowden MD on 03/05/2010   Method used:   Electronically to        [facility]  [phone]* (retail)       [address]       [place], [place]  [address]       Ph: [phone] or [phone]       Fax: [phone]   RxID:   [id number]

## 2010-09-01 ENCOUNTER — Ambulatory Visit
Admission: RE | Admit: 2010-09-01 | Discharge: 2010-09-01 | Disposition: A | Discharge status: Home / Self Care | Payer: Self-pay | Source: Ambulatory Visit | Attending: Neurosurgery | Admitting: Neurosurgery

## 2010-09-01 ENCOUNTER — Other Ambulatory Visit: Payer: Self-pay | Admitting: Neurosurgery

## 2010-09-01 DIAGNOSIS — Z981 Arthrodesis status: Secondary | ICD-10-CM

## 2010-10-06 LAB — CBC
MCH: 30.2 pg (ref 26.0–34.0)
MCHC: 33.7 g/dL (ref 30.0–36.0)
MCV: 89.8 fL (ref 78.0–100.0)
Platelets: 246 10*3/uL (ref 150–400)
RDW: 13.8 % (ref 11.5–15.5)

## 2010-10-06 LAB — TYPE AND SCREEN: Antibody Screen: NEGATIVE

## 2010-10-09 LAB — DIFFERENTIAL
Eosinophils Relative: 0 % (ref 0–5)
Lymphocytes Relative: 9 % — ABNORMAL LOW (ref 12–46)
Lymphs Abs: 1.2 10*3/uL (ref 0.7–4.0)
Monocytes Relative: 1 % — ABNORMAL LOW (ref 3–12)

## 2010-10-09 LAB — C-REACTIVE PROTEIN: CRP: 0.5 mg/dL — ABNORMAL LOW (ref <0.6)

## 2010-10-09 LAB — SEDIMENTATION RATE: Sed Rate: 15 mm/hr (ref 0–22)

## 2010-10-09 LAB — CBC
Hemoglobin: 12.4 g/dL (ref 12.0–15.0)
MCV: 88.7 fL (ref 78.0–100.0)
Platelets: 249 10*3/uL (ref 150–400)
RBC: 4.15 MIL/uL (ref 3.87–5.11)
WBC: 12.3 10*3/uL — ABNORMAL HIGH (ref 4.0–10.5)

## 2010-10-10 LAB — CBC
MCV: 87.1 fL (ref 78.0–100.0)
Platelets: 207 10*3/uL (ref 150–400)
RBC: 4.5 MIL/uL (ref 3.87–5.11)
WBC: 8.1 10*3/uL (ref 4.0–10.5)

## 2010-10-10 LAB — SURGICAL PCR SCREEN: Staphylococcus aureus: NEGATIVE

## 2010-10-15 LAB — BASIC METABOLIC PANEL
Calcium: 8.8 mg/dL (ref 8.4–10.5)
GFR calc Af Amer: >60 mL/min (ref >60)
GFR calc non Af Amer: >60 mL/min (ref >60)
Sodium: 137 mEq/L (ref 135–145)

## 2010-10-15 LAB — CBC
Hemoglobin: 12.2 g/dL (ref 12.0–15.0)
RBC: 3.91 MIL/uL (ref 3.87–5.11)

## 2010-10-20 ENCOUNTER — Other Ambulatory Visit: Payer: Self-pay | Admitting: Neurosurgery

## 2010-10-20 ENCOUNTER — Ambulatory Visit
Admission: RE | Admit: 2010-10-20 | Discharge: 2010-10-20 | Disposition: A | Discharge status: Home / Self Care | Payer: BC Managed Care – PPO | Source: Ambulatory Visit | Attending: Neurosurgery | Admitting: Neurosurgery

## 2010-10-20 DIAGNOSIS — Z9889 Other specified postprocedural states: Secondary | ICD-10-CM

## 2010-12-09 NOTE — Assessment & Plan Note (Signed)
[facility]                             PULMONARY OFFICE NOTE   NAME:Wanda [name], Wanda [name]                 MRN:          [id number]  DATE:05/23/2007                            DOB:          1965-07-29    PULMONARY ACUTE EXTENDED FOLLOWUP OFFICE VISIT   HISTORY:  This is a 45 year old black female, former smoker, with a  history of recurrent cyclical cough associated with sinus disease,  status post most recent sinus surgery October, 2007, now with another  exacerbation starting with three day ago a hacking cough, productive of  yellow sputum, and sensation of chest tightness but no dyspnea,  pleuritic pain, fevers, chills, sweats, orthopnea, PND or leg swelling.   She is most distressed because presently she is being treated for a  herniated disc with radiation of pain to the right knee from her right  hip and back and the cough makes the pain much worse.  She denies any  numbness or weakness in the leg, however.  She denies any fevers,  chills, sweats, orthopnea, PND or leg swelling.   Presently, she initially denied taking any medications, but it turns out  she has already started on Prevacid as recommended and Mucinex D.   PHYSICAL EXAMINATION:  GENERAL APPEARANCE:  She is an unhappy, but not  toxic-appearing ambulatory black female in no acute distress.  VITAL SIGNS:  Afebrile with stable vital signs.  HEENT:  Reveals mild to moderate turbinate edema.  Oropharynx is clear.  There is no evidence of any purulent postnasal drainage or erythema.  NECK:  Supple without cervical adenopathy or tenderness.  Trachea is  mild.  No thyromegaly.  LUNGS:  Lung fields reveal minimum rhonchi bilaterally, air movement is  adequate.  HEART:  Has regular rate and rhythm without murmurs, rubs or gallops.  ABDOMEN:  Soft, benign.  EXTREMITIES:  Warm without calf tenderness, cyanosis, clubbing or edema.   IMPRESSION:  1. Upper respiratory tract infection with  purulent tracheobronchitis,      perhaps suggesting underlying sinusitis as well despite previous      sinus surgery in October of 2007, which presumably opened up the      sinus ostia and negated the need for chronic nasal steroids (which      I had previously recommended).  2. She now has a cough that is causing secondary back pain, therefore      I recommended the following:   RECOMMENDATIONS:  1. Continue Prevacid twice daily before meals and reviewed also a GERD      diet with her in detail.  2. Switch Mucinex D to Mucinex DM  two bid and supplement this with      Tramadol 50 mg tablets one every 4 hours p.r.n. excessive cough.  3. Take prednisone 10 mg tablets, #14, to be tapered off over 6 days.  4. Omnicef 300 mg one b.i.d. for 10 days.  5. Followup CT scan of sinuses if not 100% back to baseline.  6. I also explained to the patient the use of over-the-counter      medications which should include Advil Cold and  Sinus but not      anything with antihistamines that might aggravate sinus ostial      function.     Charlaine Dalton. Sherene Sires, MD, [facility]  Electronically Signed    MBW/MedQ  DD: 05/23/2007  DT: 05/24/2007  Job #: [id number]

## 2010-12-09 NOTE — Op Note (Signed)
NAMETRENESE, HAFT NO.:  [id number]   MEDICAL RECORD NO.:  [id number]          PATIENT TYPE:  OIB   LOCATION:  3533                         FACILITY:  [facility]   PHYSICIAN:  Reinaldo Meeker, M.D. DATE OF BIRTH:  1966/07/03   DATE OF PROCEDURE:  07/12/2007  DATE OF DISCHARGE:                               OPERATIVE REPORT   PREOPERATIVE DIAGNOSIS:  Herniated disk at L4-5, central and right.   POSTOPERATIVE DIAGNOSIS:  Herniated disk at L4-5, central and right.   PROCEDURE:  Bilateral L4-5 interlaminar laminotomy for bilateral  excision of herniated disk.   SURGEON:  Reinaldo Meeker, M.D.   ASSISTANT:  Julio Sicks, M.D.   PROCEDURE IN DETAIL:  After being placed in the prone position, the  patient's back was prepped and draped in usual sterile fashion.  Localizing x-ray was taken prior to incision to identify the appropriate  level.  A midline incision was made over the spinous processes of L4 and  L5.  Using Bovie cutting current, the incision was carried down to the  spinous processes.  Subperiosteal dissection was then carried out  bilaterally on the spinous processes and lamina and a self-retaining  retractor was placed for exposure.  X-rays showed approach at the  appropriate level.  Starting on the patient's left side, a laminotomy  was performed by removing the inferior 1/2 of the L4 lamina, the medial  1/3 of the facet joint and the superior 1/2 of the L5 lamina.  Residual  bone and ligamentum flavum were removed in a piecemeal fashion.  Similar  decompression was then carried out on the right side, once again  removing the inferior 1/2 of the L4 lamina, the medial 1/3 of the facet  joint and the superior 1/2 of the L5 lamina.  Once again, residual bone  and ligamentum flavum were removed in piecemeal fashion.  At this time,  the microscope was draped and brought into the field and used for the  remainder of the case.  Starting on the patient's right  side,  microdissection technique was used to identify the lateral aspect of the  thecal sac and L5 nerve root.  Further coagulation was carried down  toward the canal to identify the L4-5 disk, which was found to be  remarkably herniated.  The disk space was then coagulated and incised  with a 15 blade and thoroughly cleaned out with pituitary rongeurs and  curettes.  Thorough disk space clean-out was carried out.  Inspection  was then carried on the left side with microdissection technique.  At  this time, superiorly the disk material was identified above the  posterior longitudinal ligament; this was removed and completed the  decompression.  Inspection was carried out once more bilaterally and no  evidence of recurrent disk material could be identified.  At this time,  large amounts of irrigation were carried out.  Any bleeding was  controlled with bipolar coagulation and Gelfoam.  The wound was then  closed in multiple layers of Vicryl on the muscle, fascia, subcutaneous  and subcuticular tissues and staples were placed on the skin.  A sterile  dressing was then applied and the patient was extubated and taken to the  recovery room in stable condition.           ______________________________  Reinaldo Meeker, M.D.     ROK/MEDQ  D:  07/12/2007  T:  07/13/2007  Job:  [id number]

## 2010-12-12 NOTE — Assessment & Plan Note (Signed)
HEALTHCARE                             PULMONARY OFFICE NOTE   NAME:Wanda [name], Wanda [name]                 MRN:          [id number]  DATE:09/06/2006                            DOB:          [birth year]    PRIMARY SERVICE COMPREHENSIVE HEALTH-CARE EVALUATION   HISTORY:  A 45 year old black female with a history of both chronic  rhinitis/sinusitis status post sinus surgery by Dr. Ezzard Standing in October  2007 and also with a history of symptomatic heartburn for which she  takes intermittent reflux medications. She comes in today complaining of  throat discomfort (she points to her suprasternal notch) that has  occurred over the last several months and did not seem any better while  she was taking PPI therapy versus when she stopped. It is worse with  swallowing. She denies any cough or sensation that she needs to clear  her throat, fevers, chills, sweats or positional change in terms of the  pain. She says nothing seems to help.   She also complains of numbness in a circumferential pattern from the  finger tips to the shoulders that has occurred over the last several  weeks. She does repetitively use her right hand, states that the  discomfort is worse in the morning. She denies any history of any neck  pain or previous trauma to the neck or symptoms in the left hand.   PAST MEDICAL HISTORY:  1. Morbid obesity with target weight less than 161.  2. TMJ.  3. History of smoking status post successful cessation December 2004.  4. History of remote right shoulder strain.  5. History of headaches for which she has seen Dr. Meryl Crutch in the      past.  6. History of anemia secondary to menorrhagia status post total      abdominal hysterectomy 2005.  7. History of fatigue.  8. History of depression with a strong family history of depression as      well.   ALLERGIES:  DOXYCYCLINE CAUSES NAUSEA, TYLOX CAUSES ITCHING.   MEDICATIONS:  None at all. She has not  tried any medications for her  throat or hand.   SOCIAL HISTORY:  She quit smoking in 2004. She denies any excessive  alcohol use. 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   FAMILY HISTORY:  Positive for suicide in her father, depression in her  mother and ovarian cancer in maternal grandmother. She only has one  brother; he has congestive heart failure and hypertension.   REVIEW OF SYSTEMS:  Taken in detail on the worksheet and negative except  as outlined above.   PHYSICAL EXAMINATION:  This is an ambulatory black female with somewhat  of a belle indifference affect and attitude. She has stable vital  signs with weight 211 which is down from 219 a year ago.  HEENT:  Limited optic exam revealed no significant retina or arterial  change on funduscopy. Ear canals were clear bilaterally. Neck was supple  without cervical adenopathy or tenderness. Trachea is midline. No  thyromegaly. I could not appreciate  any thyroid enlargement or  tenderness over the suprasternal notch where she pointed to the  discomfort. Oropharynx was clear with no excessive post-nasal drainage  or cobblestoning, and dentition was intact. Nasal turbinates were also  normal.  LUNGS:  Fields completely clear bilaterally to auscultation and  percussion.  HEART:  Regular rhythm without murmur, gallop or rub. No displacement of  PMI.  ABDOMEN:  Soft, benign with no palpable organomegaly or masses or  tenderness.  EXTREMITIES:  Warm without calf tenderness, cyanosis, clubbing or edema.  NEUROLOGICAL:  No focal deficits or pathologic reflexes. Specifically, I  could not detect any abnormalities in the right versus the left hand.   LABORATORY DATA:  Included a normal EKG. Sed rate was 27. Chemistry  profile was normal. LFTs were normal. Total cholesterol was 201 with a  HDL of 38 and a LDL of 142. CRP was slightly elevated at 5.0. Urinalysis  was  unremarkable.   IMPRESSION:  1. Nonspecific throat discomfort, probably represents occult reflux      although she did not seem much better while taking medications for      reflux. I am going to recommend reevaluation by Dr. Ezzard Standing because      this may represent a form of rhinosinusitis with post-nasal drip      irrigation. If Dr. Ezzard Standing feels GI evaluation is needed, we could      arrange that through this office.  2. Paresthesias involving the right hand, probably represents a form      of carpal tunnel syndrome although the symptoms are a bit unusual.      I will recommend evaluation by Dr. Charlann Boxer, her orthopedist of record,      with referral to the hand surgeon at Dr. Nilsa Nutting discretion.  3. History of headaches and depression. A component of her problems      outlined in problems 1 and 2 may be partly functional in nature.      For now, this is a diagnosis of exclusion.  4. History of anemia with complete resolution status post total      abdominal hysterectomy in 2005.  5. Morbid obesity continues to be this patient's primary challenge      medically. We discussed this issue before. In fact, I found the      referral I made for nutrition for this purpose dated May 24, 2005, but the patient never made the appointment. We will try again      to have her seen by nutrition with a food diary, and I reviewed      both calorie-balance issues and a      target weight of 161 pounds for her in specific detail. Followup      here will be every 3 months, sooner if needed.     Charlaine Dalton. Wanda Sires, MD, [facility]  Electronically Signed    MBW/MedQ  DD: 09/06/2006  DT: 09/07/2006  Job #: [id number]   cc:   Kristine Garbe. Ezzard Standing, M.D.  Madlyn Frankel Charlann Boxer, M.D.

## 2010-12-12 NOTE — Assessment & Plan Note (Signed)
[facility]                             PULMONARY OFFICE NOTE   NAME:Wanda [name], Wanda [name]                 MRN:          [id number]  DATE:08/06/2006                            DOB:          Aug 22, 1965    HISTORY:  A 45 year old black female with overt heartburn symptoms for  the last 3 to 4 months have worsened to the point now where she is  having trouble swallowing and a sensation of a sore throat, especially  when she lies down at night, but also occurring during the day.  She  denies any solid food dysphagia, however, and has partially improved  with over-the-counter antacids.   PHYSICAL EXAMINATION:  She is a pleasant ambulatory black female in no  acute distress.  VITAL SIGNS:  Stable vital signs.  HEENT:  Does reveal mild erythema to the oropharynx with no specific  features.  There is no cobblestoning or excessive post-nasal drip.  NECK:  Supple without cervical adenopathy or tenderness. Trachea is  midline.  No thyromegaly.  LUNGS:  Fields completely clear bilaterally to auscultation and  percussion.  CARDIAC:  Regular rate and rhythm without murmur, gallop, or rub.  ABDOMEN:  Soft and benign.  EXTREMITIES:  Warm without calf tenderness, cyanosis, clubbing, or  edema.  She did have a cut on her right thumb, extensor surface, that is  healing nicely.   IMPRESSION:  1. Overt heartburn symptoms probably related to weight gain.  I      advised her on diet and recommended Prevacid 30 mg tablets 2 boxes      of samples given, and will refer her to gastroenterology if not      improved after the samples are complete.  Subsequent to this, she      can just take over-the-counter Prilosec, but needs to observe the      diet carefully.  2. Recent finger cut.  Chart review indicates she was due tetanus,      which we gave her today, but the finger appears to be healing      nicely.  Comprehensive health care evaluation is due by __________     Charlaine Dalton. Sherene Sires, MD, [facility]  Electronically Signed    MBW/MedQ  DD: 08/06/2006  DT: 08/07/2006  Job #: [id number]

## 2010-12-12 NOTE — Assessment & Plan Note (Signed)
[facility]                             PULMONARY OFFICE NOTE   NAME:Wanda [name], Wanda [name]                 MRN:          [id number]  DATE:10/01/2006                            DOB:          06-Jun-1966    HISTORY OF PRESENT ILLNESS:  The patient is a 45 year old, African-  American female patient Dr. Thurston Hole who has a known history of  gastroesophageal reflux and presents for an acute office visit. The  patient reports on September 29, 2006 that she was walking down some steps at  her house and she slipped on some water and stumbled forward down 7  steps. The patient complains she has multiple bruises along her arms,  legs, knees and wrists. The patient denies any known head injury,  nausea, vomiting, visual changes, chest pain, shortness of breath, calf  pain or swelling. The patient does complain that she has had increased  soreness today and has noticed that her right wrist continues to have  swelling and is quite sore to touch.   PAST MEDICAL HISTORY:  Reviewed.   CURRENT MEDICATIONS:  Reviewed.   PHYSICAL EXAMINATION:  GENERAL:  The patient is a pleasant female in no  acute distress.  VITAL SIGNS:  She is afebrile with stable vital signs.  HEENT:  PERRLA. The posterior pharynx is clear. TMs are normal.  NECK:  Supple without cervical adenopathy. Cervical range of motion is  normal.  LUNGS:  Lung sounds are clear to auscultation bilaterally.  CARDIAC:  S1, S2 without murmurs, rubs or gallops.  ABDOMEN:  Soft, no hepatosplenomegaly. No guarding or rebound noted.  EXTREMITIES:  Warm without any calf tenderness, cyanosis, clubbing or  edema. Negative Homan sign. Pulses are intact.  NEUROLOGIC:  No focal deficit detected.   The patient has multiple ecchymotic areas along the left lower lateral  leg, right upper arm and right external hip. Along the right wrist and  hand is swollen, tender to palpate with decreased range of motion. The  patient's range  of motion of the shoulders, elbows, knees and ankles are  within normal limits.   IMPRESSION/PLAN:  Recent fall with multiple contusions. The patient is  advised to use Tylenol and Tramadol as needed for pain. The patient is  to use ice along the knees and right wrist. I sent the patient over for  right forearm, wrist and hand x-rays to rule out possible fracture. If  there is a fracture present ill refer to orthopedics, Dr. Charlann Boxer is the  patient's preference. If negative, the  patient is to continue with pain medications, ice and warm soaks as  needed. The patient will return back to the office as scheduled or  sooner if needed.      Rubye Oaks, NP  Electronically Signed      Charlaine Dalton. Sherene Sires, MD, [facility]  Electronically Signed   TP/MedQ  DD: 10/01/2006  DT: 10/01/2006  Job #: [id number]

## 2010-12-12 NOTE — H&P (Signed)
NAME:  Wanda [name], Wanda [name]                    ACCOUNT NO.:  [id number]   MEDICAL RECORD NO.:  [id number]                   PATIENT TYPE:  INP   LOCATION:  NA                                   FACILITY:  [facility]   PHYSICIAN:  Randye Lobo, M.D.                DATE OF BIRTH:  06-Jun-1966   DATE OF ADMISSION:  DATE OF DISCHARGE:                                HISTORY & PHYSICAL   CHIEF COMPLAINT:  Irregular vaginal bleeding and pelvic pain.   HISTORY OF PRESENT ILLNESS:  The patient is a 45 year old gravida 2, para 2,  African American female, status post cesarean section x2, status post  bilateral tubal ligation, and status post hysteroscopy with D&C and  diagnostic laparoscopy in September of 2003, for irregular vaginal bleeding  and pelvic pain, who presents with continuation of her symptoms.  At the  time the patient had her hysteroscopy and laparoscopy procedure, significant  adhesions between the lower uterine segment of the uterus and the anterior  abdominal wall were noted and not lysed at the time of laparoscopy due to  the density of the adhesions.  The hysteroscopy portion of the procedure  documented a benign endometrial polyp.  Proliferative endometrium was also  appreciated.  Since that time, the patient has continued to have irregular  menses, on occasion, two times per month, and she has been taking monthly  Provera therapy to try to control her bleeding.  The patient has been a  smoker until very recently, eliminating the option of combined oral  contraceptive pills for treatment for her periods.  The patient has  continued to also have persistent left lower-quadrant discomfort.  The  patient at this time is interested to proceed with surgical treatments of  the bleeding and the pelvic pain, and she denies any future child-bearing  opportunities.   PAST OBSTETRIC AND GYNECOLOGIC HISTORY:  Status post cesarean section x2,  status post bilateral tubal ligation, status post  diagnostic laparoscopy  with hysteroscopy D&C in 2003.  The patient has a history of a right  Bartholin's gland cyst which is currently asymptomatic.  The patient's last  Pap smear was performed in August of 2004, and was within normal limits.   PAST MEDICAL HISTORY:  1. Mild hyperlipidemia.  2. History of tobacco use.  The patient quit the use of tobacco in December     of 2004.  3. Recurrent tracheal bronchitis.  A component of reflux is thought to be     part of this.  4. TMJ.  5. History of anemia.  6. History of depression.  7. History of migraine and tension headaches.  8. History of degenerative joint disease of the knees.   PAST SURGICAL HISTORY:  1. Status post cesarean section x2.  2. Status post bilateral tubal ligation.  3. Status post diagnostic laparoscopy with hysteroscopy D&C in 2003.  4. Status post patellar release.   MEDICATIONS:  1.  A multivitamin.  2. Calcium supplementation.  3. Vitamin C.  4. Provera 10 mg p.o. daily for days 1 through 10 of the month.   ALLERGIES:  Tylox produces itching.   SOCIAL HISTORY:  The patient is single.  She works as a Patent examiner for  [employer].  She quit the use of tobacco in 2004.  She denies the use  of alcohol or illicit drugs.   FAMILY HISTORY:  Positive for ovarian cancer in a grandmother.   REVIEW OF SYSTEMS:  The patient denies any symptoms of leakage of urine.   PHYSICAL EXAMINATION:  VITAL SIGNS:  Blood pressure 122/78.  GENERAL:  The patient is a well-nourished, African American female, in no  acute distress.  HEENT:  Normocephalic, atraumatic.  LUNGS:  Clear to auscultation bilaterally.  HEART:  S1, S2 with a regular rate and rhythm.  ABDOMEN:  Soft, nontender and without evidence of hepatosplenomegaly or  organomegaly.  There is no evidence of herniorrhaphy.  PELVIC EXAM:  There is a 1 cm nontender cyst of Bartholin's gland.  The  urethra is unremarkable.  The cervix and vagina are without  lesions.  The  uterus is noted to be small and nontender.  No adnexal masses or tenderness  are appreciated.   IMPRESSION:  The patient is a 45 year old gravida 2, para 2, African  American female with dysfunctional uterine bleeding and pelvic pain.  The  patient is status post cesarean section x2, and on laparoscopy has been  noted to have extensive adhesions between the lower uterine segment and the  anterior abdominal wall.  The patient declines any future childbearing.   The patient has a second-degree relative with ovarian cancer.   PLAN:  The patient will undergo a total abdominal hysterectomy with possible  bilateral salpingo-oophorectomy.  Lysis of adhesions is also anticipated.  The risks, benefits and alternatives have been discussed with the patient  who wishes to proceed.                                               Randye Lobo, M.D.    BES/MEDQ  D:  10/31/2003  T:  10/31/2003  Job:  [id number]

## 2010-12-12 NOTE — Op Note (Signed)
NAMEMarland Kitchen  Wanda [name], Wanda [name]                    ACCOUNT NO.:  [id number]   MEDICAL RECORD NO.:  [id number]                   PATIENT TYPE:  AMB   LOCATION:  SDC                                  FACILITY:  [facility]   PHYSICIAN:  Randye Lobo, M.D.                DATE OF BIRTH:  Apr 23, 1966   DATE OF PROCEDURE:  04/21/2002  DATE OF DISCHARGE:                                 OPERATIVE REPORT   PREOPERATIVE DIAGNOSES:  1. Dysmenorrhea.  2. Menometrorrhagia.  3. Cervical stenosis.   POSTOPERATIVE DIAGNOSES:  1. Dysmenorrhea.  2. Menometrorrhagia.  3. Cervical stenosis.  4. Pelvic and abdominal adhesions.   PROCEDURE:  Hysteroscopy, dilation and curettage, diagnostic laparoscopy  with laparoscopic lysis of adhesions.   SURGEON:  Randye Lobo, M.D.   ASSISTANT:  Carrington Clamp, M.D.   ANESTHESIA:  General endotracheal, local with 0.25% Marcaine.   IV FLUIDS:  1200 cc Ringer's lactate.   ESTIMATED BLOOD LOSS:  50 cc.   URINE OUTPUT:  300 cc.   COMPLICATIONS:  None.   SORBITOL DEFICIT:  20 cc.   INDICATIONS FOR PROCEDURE:  The patient is a 45 year old gravida 2, para 2  African-American female, status post bilateral tubal ligation and status  post cesarean section x2.  She had a several-month history of irregular  vaginal bleeding and dysmenorrhea.  The patient was treated with Micronor  and Ponstel, which did not adequately ameliorate her symptoms.  She was not  a candidate for oral contraceptive therapy, as she was a current user of  tobacco.  The patient had a pelvic ultrasound which documented an  endometrial thickness of 0.8 cm.  There was a thickened and an echogenic  area measuring 1.2 cm in diameter in the lower uterus.  The right ovary  contained two simple cysts, measuring 1.2 and 1.1 cm; the left ovary was not  visualized.  The patient had a hemoglobin of 13.0.   An attempt was made to perform an endometrial biopsy in the office with  paracervical block,  however, cervical stenosis prevented this.  The patient  wished for further evaluation and treatment of her pain and bleeding.  The  plan was made to proceed with surgery after the risks, benefits and  alternatives were discussed with her.   FINDINGS:  Hysteroscopy was performed initially, and there was poor  visualization through the diagnostic hysteroscope.  The endometrium appeared  to be fluffy.  The Sorbitol deficit was only 20 cc of 3% Sorbitol.   Laparoscopy demonstrated extensive omental adhesions to the anterior  abdominal wall, at the level of the umbilicus.  There were also thickened  adhesions between the uterus, the bladder and the anterior abdominal wall.  The uterus appeared to be contiguous with the anterior abdominal wall in  this region.  The patient had normal ovaries bilaterally and the tubes were  consistent with a prior bilateral tubal ligation.  The liver was  unremarkable.  The appendix was not visualized.  The area around the cecum  was inspected, but the appendix could not be seen.   There was no evidence of any endometriosis covering the peritoneal surfaces  or the bowel.   SPECIMENS:  Endometrial curettings were sent to pathology.   DESCRIPTION OF PROCEDURE:  With an IV in place, the patient was taken to the  operating room after she was properly identified.  The patient did receive  Ancef 1 g intravenously for antibiotic prophylaxis.  On the operating room  table, general endotracheal anesthesia was induced and the patient was then  placed in the dorsal lithotomy position.  The abdomen and the vagina were  then sterilely prepped and draped, and a Foley catheter was sterilely placed  inside the bladder.  An examination under anesthesia was performed.  The  uterus was noted to be small and anteverted, and no adnexal masses were  appreciated.   A speculum was placed inside the vagina and a single-tooth tenaculum was  placed on the anterior cervical lip.   The cervix was then sounded to a #19  Pratt dilator, and the diagnostic hysteroscope was then inserted into the  uterine cavity under a continuous infusion of Sorbitol.  The visualization  was poor and the decision was made to proceed directly with curettage.  The  cervix was then dilated further to a #21 Pratt dilator, and a serrated curet  was then used to curet the endometrium in all four quadrants -- such that  the tissue had a gritty texture to it.  The tissue was sent to pathology.  A  Hulka tenaculum was then used to replace the single-tooth tenaculum and all  of the remaining instruments were removed from the vagina.   Attention was then turned to the abdomen, where an umbilical incision was  created sharply with the scalpel.  A 10 mm trocar was then placed directly  into the abdominal cavity.  A laparoscope confirmed proper placement, and a  pneumoperitoneum was achieved.  The patient was placed in a Trendelenburg  position at this time.  A 5 mm incision was created in the left lower  quadrant, and a 5 mm trocar was inserted under direct visualization of the  laparoscope.  The same procedure was then carried out in the right lower  quadrant, again under visualization with the laparoscope.  The findings are  as noted above.  A 5 mm laparoscope was then inserted through the right lower quadrant  incision, alternating with the left lower quadrant incision, in order to  perform lysis of adhesions of the omentum up to the anterior abdominal wall  at the level of the umbilicus.  This was performed with a combination of  bipolar cautery and sharp dissection with Mayo scissors.  Bipolar cautery  was used to achieve adequate hemostasis in this region during the dissection  process.  There was no bowel which was noted in this region.  100% of the  omental adhesions were lysed.  The 10 mm laparoscope was then returned to the umbilical trocar site, after the omental adhesions had been lysed,  and  the omentum was inspected.  The surgical site was found to be hemostatic.  The pelvis and abdomen were then irrigated with normal saline.  A part of  the irrigation was then suctioned and removed.  The pneumoperitoneum was  then removed and the operative sites were re-examined.  There was no ongoing  bleeding noted.  This concluded  the patient's procedure at this point.  The  trocars were removed under visualization of the laparoscope, and the  umbilical trocar and the 10 mm laparoscope was removed simultaneously.  All  skin incisions were closed with inverted subcuticular stitches of 3-0 plain,  and Steri-Strips were placed over the incisions.   The patient was cleansed of the remaining Betadine.  All of the instruments  were removed from the vagina and the urethra sites.  The patient was taken  out of dorsal lithotomy position.  She was awakened and extubated, and sent  to recovery in stable condition.  All needle, instrument, sponge counts were  correct.                                               Randye Lobo, M.D.    BES/MEDQ  D:  04/21/2002  T:  04/22/2002  Job:  [id number]

## 2010-12-12 NOTE — Assessment & Plan Note (Signed)
[facility]                             PULMONARY OFFICE NOTE   NAME:Leadbetter, Wanda [name]                 MRN:          [id number]  DATE:09/13/2006                            DOB:          07/23/1966    PRIMARY SERVICE ACUTE OFFICE EVALUATION   HISTORY:  A 45 year old black female with history of reflux who had been  on high doses of PPI therapy for a throat irritation when I saw her on  February 11, but was unimpressed that it helped her discomfort, which  was definitely worse when trying to swallow but not associated with any  cough and referred her to her ent, recommending she leave off PPI.  She  subsequently, 4 days ago, began having a hacking cough productive of  slightly green sputum especially in the mornings and also having trouble  sleeping at night due to severe coughing paroxysms. She did not start  back on PPI therapy because she does not appreciate that she has any  reflux. She denies any pleuritic pain, dyspnea, subjective wheeze,  fevers, chills, sweats, or active sinus complaints.   PHYSICAL EXAMINATION:  She is an unhappy but not toxic-appearing,  ambulatory black female in no acute distress. She had stable vital  signs.  HEENT: Reveals minimal turbinate edema, pharynx is clear. No excessive  postnasal drip, or cobblestoning.  Dentition intact.  Ear canals clear  bilaterally.  NECK: Supple without cervical adenopathy, tenderness. Trachea is  midline. No thyromegaly.  LUNG FIELDS: Completely clear bilaterally to auscultation and  percussion.  There is a regular rate and rhythm, without murmur, gallop, or rub.  ABDOMEN:  Soft, benign.  EXTREMITIES: Warm without calf tenderness, cyanosis, clubbing, or edema.   Lab work from September 06, 2006 included a normal sed rate. I could not  find a TSH or differential on the chart today.   IMPRESSION:  Acute upper respiratory tract infection with purulent  secretions early in the  morning, indicates  rhinitis/sinusitis/tracheobronchitis. She insists that she does not have  a sinus problem and that is why she has not instituted the regimen  that I had previously recommend for the treatment of rhinitis, namely  the combination of Nasarel and Afrin. She is going to make an  appointment to see Dr. Ezzard Standing anyway within the next week; I will defer  the management of her sinuses to Dr. Ezzard Standing,  who will need to evaluate  the patient's throat pain. In the meantime, despite the fact that she  did not respond to proton pump inhibitor therapy prior to the acute  onset cough, now that she is coughing, she most likely is refluxing as a  secondary phenomenon and needs to use Protonix 40 mg perfectly regularly  before meals twice daily and suppress excess cough with tramadol one q.4  p.r.n. She can stop the ppi after no longer coughing off cough  suppressants.   I did give her a 6 day course of predinsone for any inflammatory  component that is present and reviewed with her specific issues related  to gastroesophageal reflux disease diet in detail in writing.   Also, because  she has purulent sputum, I recommended Omnicef and since  she tends to get yeast infections, I recommended Diflucan 100 mg tablets  one tablet to be taken after her round of antibiotics is complete if  needed.     Charlaine Dalton. Sherene Sires, MD, [facility]  Electronically Signed    MBW/MedQ  DD: 09/13/2006  DT: 09/13/2006  Job #: [id number]   cc:   Kristine Garbe. Ezzard Standing, M.D.

## 2010-12-12 NOTE — H&P (Signed)
NAME:  Wanda [name], Wanda [name]                 ACCOUNT NO.:  [id number]   MEDICAL RECORD NO.:  [id number]                   PATIENT TYPE:  AMB   LOCATION:  SDC                                  FACILITY:  [facility]   PHYSICIAN:  Randye Lobo, M.D.                DATE OF BIRTH:  12-31-1965   DATE OF ADMISSION:  04/21/2002  DATE OF DISCHARGE:                                HISTORY & PHYSICAL   CHIEF COMPLAINT:  1. Menometrorrhagia.  2. Dysmenorrhea.   HISTORY OF PRESENT ILLNESS:  The patient is a 45 year old gravida 2, para 2  African-American female status post bilateral tubal ligation who initially  presented to the office in September 2002 reporting irregular menstrual  cycles and dysmenorrhea.  The patient had been treated in the past with  birth control pills.  As the patient is a current user of tobacco and is  over age 50, her oral contraceptive pills were discontinued and the patient  instead was offered Micronor therapy.  The patient was also prescribed  Ponstel as a combination of the two did not adequately ameliorate the  patient's symptoms and she represented for evaluation in August 2003.  The  patient had a pelvic ultrasound performed which documented a uterine size of  7.7 x 3.1 x 4.2 cm.  The endometrial thickness was 0.8 cm.  In the lower  uterus there was a thickened echogenic area which measured 1.3 cm in  diameter with tiny anechoic spaces.  The right ovary measured 2.8 cm in its  largest diameter with two small simple cysts measuring 1.2 cm and 1.1 cm.  The left ovary was not visualized.  There was no evidence of any free fluid  appreciated.  The patient's hemoglobin was found to be 13.0 with an MCV of  88.  An attempt was made to perform an endometrial biopsy in the office with  a paracervical block.  This was not possible, however, secondary to cervical  stenosis.   The patient was wished further evaluation and treatment of her dysmenorrhea  and her  menometrorrhagia.   PAST OBSTETRIC/GYNECOLOGIC HISTORY:  Remarkable for two prior cesarean  sections.  The patient's initial cesarean section was performed for failure  to progress.  The patient has no history of abnormal Pap smears.  Her last  Pap smear was performed on March 17, 2002 and was within normal limits.   PAST MEDICAL HISTORY:  Unremarkable.   PAST SURGICAL HISTORY:  1. Status post cesarean section x2.  2. Status post patellar release in 2001.   MEDICATIONS:  None.   ALLERGIES:  TYLOX.   SOCIAL HISTORY:  The patient works for [employer].  She smokes  approximately one-half pack per day.  The patient denies the use of alcohol  and illicit drugs.   FAMILY HISTORY:  The patient has a grandmother who has ovarian cancer.   PHYSICAL EXAMINATION:  VITAL SIGNS:  Blood pressure 130/78.  GENERAL:  The patient is an African-American female in no acute distress.  HEENT:  Normocephalic, atraumatic.  NECK:  Negative for adenopathy and thyromegaly.  HEART:  S1, S2 with a regular rate and rhythm and no evidence of a murmur,  rub, or gallop.  LUNGS:  Clear to auscultation bilaterally.  ABDOMEN:  Soft and nontender and without hepatosplenomegaly or organomegaly.  There is no evidence of any lymphadenopathy.  PELVIC:  Demonstrates normal external genitalia.  The cervix and vagina  demonstrate no lesions.  On speculum examination the os is difficult to see.  The uterus is small, anteverted, and slightly tender.  There are no adnexal  masses appreciated and no specific tenderness.   IMPRESSION:  The patient is a 45 year old gravida 2, para 2 African-American  female status post bilateral tubal ligation who has a history of  menometrorrhagia and dysmenorrhea.  The patient's symptoms have not been  improved by medical therapy.   PLAN:  The plan will be for the patient to undergo hysteroscopy with D&C and  a laparoscopy with possible lysis of adhesions and excision of  endometriosis  at the [facility] on April 21, 2002.  Risks, benefits, and  alternatives have been discussed with the patient who wishes to proceed.                                               Randye Lobo, M.D.    BES/MEDQ  D:  04/20/2002  T:  04/20/2002  Job:  [id number]

## 2010-12-12 NOTE — Discharge Summary (Signed)
Wanda [name]                    ACCOUNT NO.:  [id number]   MEDICAL RECORD NO.:  [id number]                   PATIENT TYPE:  INP   LOCATION:  9319                                 FACILITY:  [facility]   PHYSICIAN:  Randye Lobo, M.D.                DATE OF BIRTH:  03-23-1966   DATE OF ADMISSION:  11/01/2003  DATE OF DISCHARGE:  11/03/2003                                 DISCHARGE SUMMARY   ADMISSION DIAGNOSES:  1. Dysfunctional uterine bleeding.  2. Pelvic pain.  3. Pelvic adhesions.   DISCHARGE DIAGNOSES:  1. Dysfunctional uterine bleeding.  2. Pelvic pain.  3. Pelvic and abdominal adhesions.  4. Status post total abdominal hysterectomy with lysis of adhesions.   SIGNIFICANT OPERATIONS AND PROCEDURES:  The patient underwent a total  abdominal hysterectomy with lysis of adhesions at the [facility] on November 01, 2003, under the direction of Dr. Wyvonnia Lora and  with the assistance of Dr. Ileana Roup.   ADMISSION HISTORY AND PHYSICAL EXAMINATION:  The patient is a 45 year old,  gravida 2, para 2, African American female, status post Cesarean section  times two, status post bilateral tubal ligation, and status post  hysteroscopy with D&C and diagnostic laparoscopy in September 2003 for  irregular vaginal bleeding and pelvic pain, who continued with these  symptoms following her surgery. At the time of the patient's hysteroscopy  and laparoscopy she was noted to have significant adhesions between the  lower uterine segment of the uterus and the anterior abdominal wall which  were not lysed at the time of the laparoscopy, due to the density of the  adhesions. The hysteroscopy documented a benign endometrial polyp and  proliferative endometrium. The patient continued to have irregular bleeding  which was consistent with dysfunctional uterine bleeding and she failed  Provera therapy. As the patient was a smoker and recently quit, she was not  a candidate  for the use of combined oral contraceptive pills. The patient  denied any future childbearing and wished for definitive surgical treatment  of her pain and bleeding. She was therefore consented for a hysterectomy  with lysis of adhesions and a possible bilateral salpingo-oophorectomy.   Her admission physical exam documented a well-healed Pfannenstiel incision  without evidence of herniation. The abdomen was soft and nontender, and  without hepatosplenomegaly or organomegaly. The pelvic examination  documented a 1 cm, nontender Bartholin's gland. The urethra was normal. The  cervix and vagina were without lesions. The uterus was noted to be small and  nontender. No adnexal masses or tenderness were appreciated.   HOSPITAL COURSE:  The patient was admitted on November 01, 2003, at which time  she underwent a total abdominal hysterectomy with lysis of adhesions between  the uterus and anterior abdominal wall and between the omentum in the  anterior abdominal wall near the umbilicus. The surgery was uncomplicated  and the patient had an estimated blood loss of  100 cc. The patient's  fallopian tubes and ovaries were noted to be normal.   Postoperatively, the patient had a benign surgical recovery. The patient  received a morphine PCA and ketorolac to control her pain immediately  following her surgery. The patient did received PAS stockings and TED hose  for DVT prophylaxis.   On postoperative day #1, the patient was successfully converted over to oral  pain medication including Tylenol #3 and ibuprofen. The patient did have  some nausea, but was able to keep clear liquids down. Eventually she was  able to tolerate a regular diet by the time of her discharge. The patient's  Foley catheter was removed on postoperative day #1. She did have some  bladder spasms. She was able to void spontaneously and bladder spasms  resolved. The patient did have some bleeding along the skin edges of her   incision, which was treated with pressure and a dressing. This did stop, and  the patient's incision remained intact with the staples in place and no  evidence of any erythema or continued drainage at the time of discharge.  The patient was able to ambulate independently.   The patient's postoperative day #1 hemoglobin was noted to be 10.4 and she  was tolerating this well. Her final pathology report returned documenting a  benign cervix, secretory endometrium with no hyperplasia or malignancy.  There was focal adenomyosis and no evidence of endometriosis.   The patient was found to be in good condition and ready for discharge on  postoperative day #2.   INSTRUCTIONS AT DISCHARGE:  1. Discharge to home.  2. The patient will take Tylenol #3 one to two p.o. q.4-6h. p.r.n. pain.  3. Ibuprofen 600 mg p.o. q.6h. p.r.n.  4. The patient will have decreased activity for the next six weeks.  5. The patient will follow up in the office the following Monday, in two     days, for staple removal.  6. She will call if she experiences difficulty with incisional drainage,     redness, increased pain, vaginal bleeding like a period, nausea,     vomiting, difficulty tolerating oral intake, fever, or any other concern.                                               Randye Lobo, M.D.    BES/MEDQ  D:  11/21/2003  T:  11/22/2003  Job:  [id number]

## 2010-12-12 NOTE — Op Note (Signed)
NAMEMarland Kitchen  Wanda [name], Wanda [name]                    ACCOUNT NO.:  [id number]   MEDICAL RECORD NO.:  [id number]                   PATIENT TYPE:  INP   LOCATION:  9399                                 FACILITY:  [facility]   PHYSICIAN:  Randye Lobo, M.D.                DATE OF BIRTH:  04-13-1966   DATE OF PROCEDURE:  11/01/2003  DATE OF DISCHARGE:                                 OPERATIVE REPORT   PREOPERATIVE DIAGNOSES:  1. Dysfunctional uterine bleeding.  2. Pelvic pain.  3. Pelvic adhesions.   POSTOPERATIVE DIAGNOSES:  1. Dysfunctional uterine bleeding.  2. Pelvic pain.  3. Pelvic and abdominal adhesions.   PROCEDURE:  Total abdominal hysterectomy, lysis of adhesions.   SURGEON:  Randye Lobo, M.D.   ASSISTANT:  Luvenia Redden, M.D.   FLUIDS REPLACED:  1600 mL Ringer's lactate.   ESTIMATED BLOOD LOSS:  100 mL.   URINE OUTPUT:  700 mL of clear yellow urine.   COMPLICATIONS:  None.   INDICATION FOR PROCEDURE:  The patient is a 45 year old gravida 2, para 2,  African-American female, status post cesarean section x2, status post  bilateral tubal ligation, and status post hysteroscopy, D&C, and diagnostic  laparoscopy, who presented with irregular vaginal bleeding and pelvic pain  which have continued since the time of the patient's laparoscopy and  hysteroscopy.  Findings at the time of that surgery included an endometrial  polyp and a uterus which was densely adherent to the anterior abdominal wall  and bladder.  The patient was a smoker and was treated with Provera therapy  to control dysfunctional uterine bleeding.  The patient continued to have  irregular menses and continued pain and she desired no future childbearing  and therefore requested definitive treatment.  A plan was made to proceed  with a total abdominal hysterectomy with possible bilateral salpingo-  oophorectomy if the ovaries were abnormal and also with expected lysis of  adhesions after risks, benefits, and  alternatives were discussed with her.  The patient did choose to proceed.   FINDINGS:  The uterus was noted to be small and densely adherent to the  anterior abdominal wall and bladder in this location.  The fallopian tubes  and ovaries were normal.  There were adhesions between the omentum and the  anterior abdominal wall just underneath the level of the umbilicus.  All of  these adhesions were completely removed in the abdomen and pelvis.   SPECIMENS:  The uterus was sent to pathology.   PROCEDURE:  With an IV in place, the patient was taken to the operating room  after she was properly identified.  The patient did receive Ancef 1 g  intravenously for antibiotic prophylaxis.  She also received PAS stockings  and TED hose for DVT prophylaxis.  In the operating room general  endotracheal anesthesia was induced and the patient was then placed in the  dorsal lithotomy position.  The abdomen and vagina  were sterilely prepped  and the patient was then sterilely draped and a Foley catheter placed inside  the bladder.   The procedure began by making a Pfannenstiel incision along the line of the  patient's previous C-section incisions.  This was performed with a scalpel.  The incision was carried down to the abdominal wall fascia using a  combination of monopolar cautery and a scalpel.  This fascia was then  incised in the midline sharply and was extended bilaterally.  The fascia was  dissected off of the overlying rectus muscles using sharp dissection and  monopolar cautery.  The posterior rectus sheath was identified with two  hemostat clamps.  It was elevated and was sharply incised with a Metzenbaum  scissors.  The incision was extended cranially and caudally.  The omental  adhesions to the anterior abdominal wall below the umbilicus were lysed  using monopolar cautery.  The adhesions between the uterus and the anterior  abdominal wall were noted immediately.   The self-retaining  retractor was placed in the abdomen and the bowel was  packed into the upper abdomen using two moistened lap pads.  Two long Kelly  clamps were then placed across the adnexal structures of the uterus.  The  adhesions between the abdominal wall and the lower uterine segment of the  uterus were partially dissected away with sharp dissection and monopolar  cautery to allow for access to the adnexal structures to begin the  hysterectomy.  Each of the round ligaments were grasped and suture ligated  with transfixing sutures of 0 Vicryl.  They were divided using monopolar  cautery.  The broad ligaments were then opened posteriorly using monopolar  cautery and the utero-ovarian ligaments were clamped, sharply divided, and  suture ligated with free ties of 0 Vicryl, followed by suture ligatures of  the same.  The bladder flap was then created with sharp dissection and the  bladder and abdominal wall were successfully dissected away from the lower  uterine segment.  There was some bleeding noted along the bladder dome  during this dissection, and this responded well to monopolar cautery.  Each  of the uterine arteries were then skeletonized and they were then clamped,  sharply divided, and suture ligated with 0 Vicryl suture.  The remainder of  the cardinal ligaments were then similar clamped, sharply divided, and  suture ligated with transfixing sutures of 0 Vicryl.  The uterosacral  ligaments were then also clamped, divided, and suture ligated.  At this  point, curved Heaney clamps were placed across the top of the vagina and the  specimen was sharply excised and was sent to pathology.  Angle sutures of 0  Vicryl, transfixing sutures were placed, and a figure-of-eight suture of 0  Vicryl was used to close the vaginal cuff in the midline.   The pelvis was then irrigated and suctioned and areas along the peritoneum of the broad ligament were cauterized with monopolar cautery to create good   hemostasis.  This completed the patient's hysterectomy procedure.   The lap pads and the self-retaining retractor were removed from the abdomen  and the abdomen was closed.  The parietal peritoneum was closed with a  running suture of 2-0 Vicryl.  Hemostasis was created along the rectus  muscles using monopolar cautery.  The fascia was closed with running sutures  of 0 Vicryl.  The subcutaneous tissue was irrigated and suctioned and made  hemostatic with monopolar cautery.  Interrupted sutures of 3-0 plain were  placed in the subcutaneous layer, and the skin was then closed with staples.  A sterile bandage was placed over the incision.  This concluded the  patient's procedure.  There were no complications.  All needle, instrument,  and sponge counts were correct.  She did go to the recovery room in good  condition.                                               Randye Lobo, M.D.   BES/MEDQ  D:  11/01/2003  T:  11/01/2003  Job:  [id number]

## 2010-12-12 NOTE — Op Note (Signed)
NAMECHANTALLE, DEFILIPPO          ACCOUNT NO.:  [id number]   MEDICAL RECORD NO.:  [id number]          PATIENT TYPE:  AMB   LOCATION:  DSC                          FACILITY:  [facility]   PHYSICIAN:  Christopher E. Ezzard Standing, M.D.DATE OF BIRTH:  January 23, 1966   DATE OF PROCEDURE:  04/30/2006  DATE OF DISCHARGE:                                 OPERATIVE REPORT   PREOPERATIVE DIAGNOSES:  1. Chronic bilateral ethmoid disease.  2. Chronic bilateral maxillary sinus disease.  3. Turbinate hypertrophy.   POSTOPERATIVE DIAGNOSES:  1. Chronic bilateral ethmoid disease.  2. Chronic bilateral maxillary sinus disease.  3. Turbinate hypertrophy.   OPERATION:  1. Functional endoscopic sinus surgery with bilateral ethmoidectomies.  2. Bilateral maxillary ostial enlargement.  3. Bilateral inferior turbinate reduction, simple.   SURGEON:  Narda Bonds, M.D.   ANESTHESIA:  General endotracheal.   COMPLICATIONS:  None.   BRIEF CLINICAL NOTE:  Wanda [name] is a 45 year old female who has had  chronic sinus problems for a number of years.  It has been the worse the  last 2 years.  The right side is generally much worse than the left.  She  has pressure in her cheek area as well as between her eyes.  She had a  couple of CT scans which show mild bilateral ethmoid disease and right  maxillary sinus disease.  She is taken to the operating room at this time  for functional endoscopic sinus surgery and turbinate reduction.   DESCRIPTION OF PROCEDURE:  After adequate endotracheal anesthesia, the nose  was prepped with Betadine solution and draped out with sterile towels.  The  patient received 1 g Ancef IV preoperatively.  The right side was injected  with Xylocaine with epinephrine for hemostasis, and Afrin packs were placed  for decongestant.  Using the 0 and 30-degree endoscopes, the uncinate  process was incised and removed.  The anterior and posterior ethmoid cells  were opened up with a microdebrider.   The maxillary ostia was identified on  the right side and was enlarged with a straight-through cup and back-cutting  forceps.  This was enlarged to approximately 1-cm size.  There was a little  bit of thickened mucosa within the ethmoid area and for the maxillary sinus  but no real polypoid disease.  This completed the right side.  The procedure  was repeated on the left side.  Again, the anterior-posterior ethmoid cells  were opened up with a microdebrider.  The uncinate process was incised and  removed.  Nasofrontal area was really not approached.  The maxillary ostia  were enlarged and using a 30-degree scope, maxillary sinus on the left side  was fairly clear.  After opening up the ethmoid and maxillary ostia,  inferior turbinate reductions were performed with bipolar cauterization.  Submucosal bipolar cauterization was performed bilaterally, and the interior  turbinates were out fractured.  This completed the procedure.  Kennedy sinus  packs were placed within the ethmoid areas bilaterally.  Selena Batten was awakened  from anesthesia and transferred to recovery room postop doing well.   DISPOSITION:  Selena Batten was discharged home later this morning on Tylenol and  Vicodin for pain, Keflex 500 mg b.i.d. for 1 week.  We will have her follow  up in my office in 4 days for recheck and have the Kennedy sinus packs  removed.           ______________________________  Kristine Garbe Ezzard Standing, M.D.     CEN/MEDQ  D:  04/30/2006  T:  04/30/2006  Job:  [id number]

## 2011-04-10 ENCOUNTER — Encounter: Payer: Self-pay | Admitting: Adult Health

## 2011-04-10 ENCOUNTER — Ambulatory Visit (INDEPENDENT_AMBULATORY_CARE_PROVIDER_SITE_OTHER): Payer: 59 | Admitting: Adult Health

## 2011-04-10 ENCOUNTER — Encounter: Payer: Self-pay | Admitting: *Deleted

## 2011-04-10 DIAGNOSIS — J329 Chronic sinusitis, unspecified: Secondary | ICD-10-CM | POA: Insufficient documentation

## 2011-04-10 DIAGNOSIS — R030 Elevated blood-pressure reading, without diagnosis of hypertension: Secondary | ICD-10-CM

## 2011-04-10 MED ORDER — TRAMADOL HCL 50 MG PO TABS: 50.0000 mg | ORAL_TABLET | Freq: Four times a day (QID) | ORAL | Status: DC | PRN

## 2011-04-10 MED ORDER — CEFDINIR 300 MG PO CAPS: 300.0000 mg | ORAL_CAPSULE | Freq: Two times a day (BID) | ORAL | Status: AC

## 2011-04-10 NOTE — Patient Instructions (Signed)
Omnicef 300mg  Twice daily  For 10 days  Mucinex DM Twice daily  As needed  Cough/congestion  Saline nasal rinses As needed   Fluids and rest Tylenol As needed   Limit Relafen use if possible CHeck blood pressures 3 times a week, keep log , bring to next ov.  Low salt diet, exercise as tolerated, weight loss.  Please contact office for sooner follow up if symptoms do not improve or worsen or seek emergency care  follow up Dr. Sherene Sires  In 4-6 weeks for physical.

## 2011-04-10 NOTE — Progress Notes (Signed)
Subjective:    Patient ID: Wanda [name], female    DOB: 1965-09-29, 45 y.o.   MRN: [id number]  HPI 45 year-old black female, quit smoking 2005, with a history of recurrent cyclical cough associated with sinus disease, status post sinus surgery October, 2007.   August 20, 2009 Acute visit. Pt c/o continuous sharp pain under right breast x 2 wks. She states that the pain radiates around to her back and shoulder blade. She states that the pain worse lying down. Pain worsens when she takes a deep breath. She also c/o nausea and increased acid reflux symptoms. No cough. Pain worse with greasy foods, stools less brown.  GBectomy 08/28/09  February 17, 2010 Acute visit. Pt c/o back pain x 2 days- started when she turned to get out of the car. Pain is mainly in her lower back- rt side- tramadol for pain not helping. acute onset while sitting and twisting before standing up no radicular features. no dysuria, fever. no better with advil rec Heating pad  Mobic 7.5 twice daily with meals for a week then take only as needed (like advil so don't use over the counters)  Percocet one every 4 hours if need for severe pain > right sided resolved then after mobic stopped began having pain down left side to toes and numbness > ER 8/3 rx valium / percocet   March 05, 2010 Acute visit. Pt c/o back pain worsening x 1 wk now shifted She states pain has moved to her left side- radiates down left leg with numbness. She states there is no feeling in left leg. worse with cough. no fever or abd pain. still ambulating but using a cane. has referral to NS for 8/15  03/27/10 Back surgery [facility]   April 28, 2010 Acute visit. Pt c/o cough x 2 days- dry and hacky. She states that this all started with nasal congestion. C/o back pain from cough. not using nasonex but did start afrin. no purulent secretions.>>  04/10/2011 Acute OV  Pt presents for an acute office visit. Complains of cough w/ yellow to green phlem, wheezing, chest  tightness, nasal congestion increase SOB x 2 days. Pt states she thinks she has bronchitis.  Sinus congestion and drainage , left ear pain . OTC not helping.      Past Medical History:  DEPRESSION (ICD-311)  FATIGUE (ICD-780.79)  HEADACHE, CHRONIC (ICD-784.0) Dr. Madelon Lips follow-up  TOBACCO ABUSE, HX OF (ICD-V15.82) quit smoking 2005  TMJ PAIN (ICD-524.62)  OBESITY (ICD-278.00) peak 233 8/08 target 179 ideal 161  Sinusitis sinus surgery 10/07 Dr. Narda Bonds  RUQ Pain  - U/S ABD August 20, 2009 > pos gallstones > Lap chole 08/28/09  Low Back strain 02/16/10 ...................................................................Marland KitchenKritzer  - Radicular features Left began 8/2 sudden onset > rx prednisone dose pack March 05, 2010  - MRI ordered March 05, 2010>>> Pos for nerve compression  - NS eval scheduled for 03/10/10 > Laminectomy 03/27/10  Health Maintaince.....................................................................Marland KitchenSherene Sires  - CPX May 26 2010 due  Past Surgical History:  4/05status post total abdominal hysterectomy but still has ovaries  back surgery Dec 08 and 03/27/10  Lap chole 08/28/09            Review of Systems Constitutional:   No  weight loss, night sweats,  Fevers, chills, fatigue, or  lassitude.  HEENT:   No headaches,  Difficulty swallowing,  Tooth/dental problems, or  Sore throat,                +  sneezing, itching, ear ache, nasal congestion, post nasal drip,   CV:  No chest pain,  Orthopnea, PND, swelling in lower extremities, anasarca, dizziness, palpitations, syncope.   GI  No heartburn, indigestion, abdominal pain, nausea, vomiting, diarrhea, change in bowel habits, loss of appetite, bloody stools.   Resp:    No coughing up of blood.     No chest wall deformity  Skin: no rash or lesions.  GU: no dysuria, change in color of urine, no urgency or frequency.  No flank pain, no hematuria   MS:  No joint pain or swelling.  No decreased range of motion.  No back  pain.  Psych:  No change in mood or affect. No depression or anxiety.  No memory loss.         Objective:   Physical Exam GEN: A/Ox3; pleasant , NAD, well nourished   HEENT:  [place]/AT,  EACs-clear, TMs-left bulging, no redness  NOSE-clear drainage, max tenderness , THROAT-clear, no lesions, no postnasal drip or exudate noted.   NECK:  Supple w/ fair ROM; no JVD; normal carotid impulses w/o bruits; no thyromegaly or nodules palpated; no lymphadenopathy.  RESP  Coarse BS w/o, wheezes/ rales/ or rhonchi.no accessory muscle use, no dullness to percussion  CARD:  RRR, no m/r/g  , no peripheral edema, pulses intact, no cyanosis or clubbing.  GI:   Soft & nt; nml bowel sounds; no organomegaly or masses detected.  Musco: Warm bil, no deformities or joint swelling noted.   Neuro: alert, no focal deficits noted.    Skin: Warm, no lesions or rashes          Assessment & Plan:

## 2011-04-10 NOTE — Assessment & Plan Note (Signed)
Elevated b/p  , tr up over last few visit.  Lifestyle changes discussed, weight loss and diet   Plan:  Limit Relafen use if possible CHeck blood pressures 3 times a week, keep log , bring to next ov.  Low salt diet, exercise as tolerated, weight loss.

## 2011-04-10 NOTE — Assessment & Plan Note (Addendum)
Acute Sinusitis w/ associated bronchitis   Plan:  Omnicef 300mg  Twice daily  For 10 days  Mucinex DM Twice daily  As needed  Cough/congestion  Saline nasal rinses As needed   Fluids and rest Tylenol As needed   Please contact office for sooner follow up if symptoms do not improve or worsen or seek emergency care  follow up Dr. Sherene Sires  In 4-6 weeks for physical.

## 2011-04-15 ENCOUNTER — Telehealth: Payer: Self-pay | Admitting: Internal Medicine

## 2011-04-15 MED ORDER — PREDNISONE 10 MG PO TABS: ORAL_TABLET | ORAL | Status: DC

## 2011-04-15 NOTE — Telephone Encounter (Signed)
Pt calling again can be reached on her cell at [phone].Wanda [name]

## 2011-04-15 NOTE — Telephone Encounter (Signed)
I spoke with pt and she states she is still having cough (can't get any mucus up), chest congestion, chest is still tight x Thursday. Pt came in and saw TP on 9/14 and was advised to take omnicef 300 mg 1 bid x 10 days, mucinex dm, and saline rinses. Pt states she is doing all of this feels like it is not helping. Pt thinks her abx needs to be changed and needs something to help with her cough. Please advise recs for pt. Thanks  Allergies  Allergen Reactions  . Oxycodone-Acetaminophen     REACTION: itch/hives    Carver Fila, CMA

## 2011-04-15 NOTE — Telephone Encounter (Signed)
Prednisone 10 mg take  4 each am x 2 days,   2 each am x 2 days,  1 each am x2days and stop   If can take tramadol use 50 mg one every 4 hours as needed for cough (#40)   Only other option is return before w/e to see one of the pulmonary docs

## 2011-04-15 NOTE — Telephone Encounter (Signed)
Spoke with pt and notified of recs per MW. Pt verbalized understanding and states already taking the tramadol, and no need to call in rx for this now. I called in prednisone and she is to contact us before the w/e if not improving or seek emergency care if needed.

## 2011-05-04 LAB — CBC
HCT: 36.7
Hemoglobin: 12.4
MCHC: 33.9
MCV: 85.8
RBC: 4.28

## 2011-07-02 ENCOUNTER — Other Ambulatory Visit: Payer: Self-pay

## 2011-07-02 ENCOUNTER — Emergency Department (HOSPITAL_COMMUNITY)
Admission: EM | Admit: 2011-07-02 | Discharge: 2011-07-02 | Disposition: A | Discharge status: Home / Self Care | Payer: 59 | Attending: Emergency Medicine | Admitting: Emergency Medicine

## 2011-07-02 ENCOUNTER — Emergency Department (HOSPITAL_COMMUNITY): Payer: 59

## 2011-07-02 ENCOUNTER — Encounter (HOSPITAL_COMMUNITY): Payer: Self-pay | Admitting: *Deleted

## 2011-07-02 DIAGNOSIS — R509 Fever, unspecified: Secondary | ICD-10-CM | POA: Insufficient documentation

## 2011-07-02 DIAGNOSIS — R209 Unspecified disturbances of skin sensation: Secondary | ICD-10-CM | POA: Insufficient documentation

## 2011-07-02 DIAGNOSIS — R51 Headache: Secondary | ICD-10-CM | POA: Insufficient documentation

## 2011-07-02 DIAGNOSIS — I10 Essential (primary) hypertension: Secondary | ICD-10-CM | POA: Insufficient documentation

## 2011-07-02 DIAGNOSIS — R070 Pain in throat: Secondary | ICD-10-CM | POA: Insufficient documentation

## 2011-07-02 DIAGNOSIS — E7800 Pure hypercholesterolemia, unspecified: Secondary | ICD-10-CM | POA: Insufficient documentation

## 2011-07-02 DIAGNOSIS — J111 Influenza due to unidentified influenza virus with other respiratory manifestations: Secondary | ICD-10-CM | POA: Insufficient documentation

## 2011-07-02 DIAGNOSIS — R000 Tachycardia, unspecified: Secondary | ICD-10-CM | POA: Insufficient documentation

## 2011-07-02 DIAGNOSIS — R5383 Other fatigue: Secondary | ICD-10-CM | POA: Insufficient documentation

## 2011-07-02 DIAGNOSIS — R0789 Other chest pain: Secondary | ICD-10-CM | POA: Insufficient documentation

## 2011-07-02 DIAGNOSIS — F329 Major depressive disorder, single episode, unspecified: Secondary | ICD-10-CM | POA: Insufficient documentation

## 2011-07-02 DIAGNOSIS — R5381 Other malaise: Secondary | ICD-10-CM | POA: Insufficient documentation

## 2011-07-02 DIAGNOSIS — R05 Cough: Secondary | ICD-10-CM | POA: Insufficient documentation

## 2011-07-02 DIAGNOSIS — R062 Wheezing: Secondary | ICD-10-CM | POA: Insufficient documentation

## 2011-07-02 DIAGNOSIS — Z79899 Other long term (current) drug therapy: Secondary | ICD-10-CM | POA: Insufficient documentation

## 2011-07-02 DIAGNOSIS — R0602 Shortness of breath: Secondary | ICD-10-CM | POA: Insufficient documentation

## 2011-07-02 DIAGNOSIS — R059 Cough, unspecified: Secondary | ICD-10-CM | POA: Insufficient documentation

## 2011-07-02 DIAGNOSIS — F3289 Other specified depressive episodes: Secondary | ICD-10-CM | POA: Insufficient documentation

## 2011-07-02 DIAGNOSIS — R630 Anorexia: Secondary | ICD-10-CM | POA: Insufficient documentation

## 2011-07-02 HISTORY — DX: Essential (primary) hypertension: I10

## 2011-07-02 HISTORY — DX: Pure hypercholesterolemia, unspecified: E78.00

## 2011-07-02 LAB — POCT I-STAT, CHEM 8
Calcium, Ion: 1.15 mmol/L (ref 1.12–1.32)
Chloride: 102 mEq/L (ref 96–112)
HCT: 34 % — ABNORMAL LOW (ref 36.0–46.0)
Potassium: 3.8 mEq/L (ref 3.5–5.1)

## 2011-07-02 LAB — POCT I-STAT TROPONIN I: Troponin i, poc: 0 ng/mL (ref 0.00–0.08)

## 2011-07-02 MED ORDER — OSELTAMIVIR PHOSPHATE 75 MG PO CAPS: 75.0000 mg | ORAL_CAPSULE | Freq: Two times a day (BID) | ORAL | Status: AC

## 2011-07-02 MED ORDER — IBUPROFEN 800 MG PO TABS
800.0000 mg | ORAL_TABLET | Freq: Once | ORAL | Status: AC
  Administered 2011-07-02: 800 mg via ORAL
  Filled 2011-07-02: qty 1

## 2011-07-02 MED ORDER — IOHEXOL 350 MG/ML SOLN: 100.0000 mL | Freq: Once | INTRAVENOUS | Status: DC | PRN

## 2011-07-02 MED ORDER — SODIUM CHLORIDE 0.9 % IV SOLN: INTRAVENOUS | Status: DC

## 2011-07-02 MED ORDER — ONDANSETRON HCL 4 MG/2ML IJ SOLN
INTRAMUSCULAR | Status: AC
  Administered 2011-07-02: 4 mg
  Filled 2011-07-02: qty 2

## 2011-07-02 MED ORDER — SODIUM CHLORIDE 0.9 % IV BOLUS (SEPSIS)
500.0000 mL | Freq: Once | INTRAVENOUS | Status: DC
  Administered 2011-07-02: 1000 mL via INTRAVENOUS

## 2011-07-02 MED ORDER — MORPHINE SULFATE 4 MG/ML IJ SOLN
4.0000 mg | Freq: Once | INTRAMUSCULAR | Status: AC
  Administered 2011-07-02: 4 mg via INTRAVENOUS
  Filled 2011-07-02: qty 1

## 2011-07-02 MED ORDER — IBUPROFEN 800 MG PO TABS
ORAL_TABLET | ORAL | Status: AC
  Filled 2011-07-02: qty 1

## 2011-07-02 NOTE — ED Notes (Signed)
Report received from Benjie Karvonen, RN at this time.  Assumed care of pt at this time.

## 2011-07-02 NOTE — ED Notes (Signed)
Pt endorses shortness of breath.  Pt endorses chest tightness with numbness in left hand.  MD aware of pt complaint.  Pt with increased heart rate and adventitious breath sounds.  Pt with even and unlabored respirations at this time.

## 2011-07-02 NOTE — ED Notes (Addendum)
Pt endorses 5/10 headache.  Pt denies chest pain at this time.  Pt endorses intermittent chest pain when coughing.

## 2011-07-02 NOTE — ED Notes (Signed)
This RN confirmed medication order with PA.  Ibuprofen to be held at this time per PA.  PA at bedside with patient at this time.  Pt requesting coffee.  Pt informed that she cannot have coffee at this time.  Pt verbalized understanding.  Per PA, pt to remain NPO pending lab results.

## 2011-07-02 NOTE — ED Provider Notes (Signed)
History     CSN: [id number] Arrival date & time: 07/02/2011  6:35 AM   First MD Initiated Contact with Patient 07/02/11 0645      Chief Complaint  Patient presents with  . Fever    (Consider location/radiation/quality/duration/timing/severity/associated sxs/prior treatment) HPI Comments: Her temperature was 103.2 F orally at the highest.  She has been having some tightness in her chest and shortness of breath since yesterday.  She also reports some numbness in her right fingers.    Patient is a 45 y.o. female presenting with fever. The history is provided by the patient.  Fever Primary symptoms of the febrile illness include fever, fatigue, headaches, cough and shortness of breath. Primary symptoms do not include visual change, wheezing, abdominal pain, nausea, vomiting, diarrhea, dysuria, altered mental status or rash. The current episode started yesterday. This is a new problem. The problem has been gradually worsening.  The headache is not associated with photophobia, visual change or neck stiffness.     Past Medical History  Diagnosis Date  . Depression   . Fatigue   . Chronic headache   . TMJ pain dysfunction syndrome   . Obesity   . Sinusitis   . Hypertension   . Hypercholesterolemia     Past Surgical History  Procedure Date  . Back surgery     x2  . Torn disk l4 l5 s1   . Nasal sinus surgery   . Vaginal hysterectomy     Family History  Problem Relation Age of Onset  . Cervical cancer Maternal Grandmother   . Cancer Maternal Grandmother     mouth  . Hypertension Brother   . Heart failure Brother     History  Substance Use Topics  . Smoking status: Former Smoker -- 20 years    Types: Cigarettes    Quit date: 07/28/2003  . Smokeless tobacco: Not on file   Comment: 3/4 of pack  . Alcohol Use: No    OB History    Grav Para Term Preterm Abortions TAB SAB Ect Mult Living                  Review of Systems  Constitutional: Positive for fever,  activity change, appetite change and fatigue.  HENT: Positive for congestion, sore throat and rhinorrhea. Negative for ear pain, drooling, trouble swallowing and neck stiffness.   Eyes: Negative for photophobia.  Respiratory: Positive for cough, chest tightness and shortness of breath. Negative for wheezing.   Gastrointestinal: Negative for nausea, vomiting, abdominal pain and diarrhea.  Genitourinary: Negative for dysuria, hematuria and decreased urine volume.  Skin: Negative for rash.  Neurological: Positive for headaches. Negative for dizziness, syncope and light-headedness.  Psychiatric/Behavioral: Negative for confusion and altered mental status.    Allergies  Oxycodone-acetaminophen and Tylox  Home Medications   Current Outpatient Rx  Name Route Sig Dispense Refill  . ACETAMINOPHEN 500 MG PO TABS Oral Take 1,000 mg by mouth every 6 (six) hours as needed. Headache pain or fever     . AMOXICILLIN 500 MG PO CAPS Oral Take 1,000 mg by mouth 3 (three) times daily.      . CYCLOBENZAPRINE HCL 10 MG PO TABS Oral Take 10 mg by mouth 3 (three) times daily as needed.      Marland Kitchen DM-GUAIFENESIN ER 30-600 MG PO TB12 Oral Take 1 tablet by mouth daily.      Marland Kitchen HYDROCODONE-HOMATROPINE 5-1.5 MG/5ML PO SYRP Oral Take 5 mLs by mouth every 6 (six) hours as  needed. cough     . LISINOPRIL-HYDROCHLOROTHIAZIDE 10-12.5 MG PO TABS Oral Take 1 tablet by mouth daily.      . MOMETASONE FUROATE 50 MCG/ACT NA SUSP Nasal Place 2 sprays into the nose daily.      . TRAMADOL HCL 50 MG PO TABS Oral Take 1 tablet (50 mg total) by mouth every 6 (six) hours as needed. 45 tablet 1    BP 108/51  Pulse 108  Temp(Src) 98.7 F (37.1 C) (Oral)  Resp 15  SpO2 100%  Physical Exam  Nursing note and vitals reviewed. Constitutional: She is oriented to person, place, and time. She appears well-developed and well-nourished. No distress.  HENT:  Head: Normocephalic and atraumatic.  Eyes: EOM are normal. Pupils are equal, round,  and reactive to light.  Neck: Normal range of motion. Neck supple.  Cardiovascular: Normal rate, regular rhythm and normal heart sounds.   Pulmonary/Chest: Effort normal.       Mild late expiratory wheeze in the RLL  Abdominal: Soft. She exhibits no distension and no mass. There is no tenderness. There is no rebound and no guarding.  Musculoskeletal: Normal range of motion.  Neurological: She is alert and oriented to person, place, and time. She has normal strength. No cranial nerve deficit.  Skin: Skin is warm and dry. No rash noted. She is not diaphoretic.  Psychiatric: She has a normal mood and affect.    ED Course  Procedures (including critical care time)  Labs Reviewed  D-DIMER, QUANTITATIVE - Abnormal; Notable for the following:    D-Dimer, Quant 1.74 (*)    All other components within normal limits  POCT I-STAT, CHEM 8 - Abnormal; Notable for the following:    Glucose, Bld 106 (*)    Hemoglobin 11.6 (*)    HCT 34.0 (*)    All other components within normal limits  POCT I-STAT TROPONIN I  I-STAT TROPONIN I  I-STAT, CHEM 8   Dg Chest 2 View  07/02/2011  *RADIOLOGY REPORT*  Clinical Data: Fever, chest pain, cough  CHEST - 2 VIEW  Comparison: August 20, 2009  Findings: The cardiac silhouette, mediastinum, pulmonary vasculature are within normal limits.  Both lungs are clear. There is no acute bony abnormality.  IMPRESSION: There is no evidence of acute cardiac or pulmonary process.  Original Report Authenticated By: Brandon Melnick, M.D.   Ct Angio Chest W/cm &/or Wo Cm  07/02/2011  *RADIOLOGY REPORT*  Clinical Data:  Cough, fever, shortness of breath, chest tightness, elevated D-dimer  CT ANGIOGRAPHY CHEST WITH CONTRAST  Technique:  Multidetector CT imaging of the chest was performed using the standard protocol during bolus administration of intravenous contrast.  Multiplanar CT image reconstructions including MIPs were obtained to evaluate the vascular anatomy.  Contrast:  97 ml  Omni 350  Comparison:  None  Findings: There are no filling defects within either pulmonary arterial system to suggest a segmental or subsegmental pulmonary embolus.  There is no axillary, hilar, or mediastinal adenopathy. The thoracic aorta has a normal caliber and appearance.  Both lungs are clear.  The osseous structures are unremarkable. The visualized upper abdomen is unremarkable.  A small hiatal hernia is noted.  Review of the MIP images confirms the above findings.  IMPRESSION: Negative CTA of the chest with no evidence of pulmonary embolus or other acute abnormality.  Original Report Authenticated By: Brandon Melnick, M.D.     No diagnosis found.   Date: 07/02/2011  Rate: 112  Rhythm: sinus  tachycardia  QRS Axis: normal  Intervals: normal  ST/T Wave abnormalities: normal  Conduction Disutrbances:none  Narrative Interpretation:   Old EKG Reviewed: Comparable to old EKG aside from the tachycardia  10:26 AM Reassessed patient.  Her HR has decreased to 102bpm.  Her fever has decreased with ibuprofen administration.  She reports that her pain has improved.  Her pulse ox remains at 99 on RA while off of oxygen.  Patient able to tolerate po liquids.   MDM  Patient with CP, SOB, and tachycardia.  Therefore, d-dimer was ordered.  D-dimer elevated at 1.74.  Therefore, CT angiogram was ordered.  Negative CT angiogram.  Negative CXR.  Troponin 0.0.  No ST changes on EKG. Signs and symptoms consistent with influenza.  Therefore, will treat with tamiflu and discharge patient home.        [name] [facility] 07/02/11 1514

## 2011-07-02 NOTE — ED Notes (Signed)
Pt. Provided with water for PO encouragement.

## 2011-07-02 NOTE — ED Notes (Signed)
Pt daughter states that she has been having cough, fever this morning and she has been increasing with body aches.

## 2011-07-02 NOTE — ED Notes (Addendum)
Pt endorses relief of pain.  Pt still with active cough.  Pt with no needs when assistance offered at this time.

## 2011-07-02 NOTE — ED Notes (Addendum)
Pt with SBP in 90s.  PA, Anne Shutter, made aware.  Pt to receive NS bolus after administration of pain medication. Pt complaining of 7/10 head pain.

## 2011-07-02 NOTE — ED Notes (Addendum)
Pt assisted on and off bedpan. EKG completed by Tech.  Pt states, " I feel like I can breathe better when I stand up.  No other needs requested when assistance offered to pt at this time. Family remains at bedside. Pt informed of upcoming tests/studies.  Plan of care discussed with pt. Pt verbalized understanding.

## 2011-07-02 NOTE — ED Notes (Signed)
Pt states that she has been working around co-workers that have been sick. Pt states that she developed a cough yesterday and thought it was because of her lisinopril. Pt states that she woke up this morning and she was achy all over, she was feverish and she felt congested. Pt took hydrocodone this morning for the aches and pain and fever.  Pt alert and oriented and able to follow commands and move extremities

## 2011-07-02 NOTE — ED Notes (Signed)
Pt undressed and placed in gown. Pt is on cardiac monitor, bp cuff, and pulse ox.  Pt also placed on 2L  O2

## 2011-07-02 NOTE — ED Notes (Addendum)
Patient transported to and from X-ray. Phlebotomy to return for lab draw once pt returns from radiology.

## 2011-07-02 NOTE — ED Notes (Signed)
Patient transported from CT 

## 2011-07-02 NOTE — ED Notes (Signed)
Patient transported to CT 

## 2011-07-03 NOTE — ED Provider Notes (Signed)
Medical screening examination/treatment/procedure(s) were performed by non-physician practitioner and as supervising physician I was immediately available for consultation/collaboration.  Catalia Massett R. Takayla Baillie, MD 07/03/11 1948 

## 2011-10-28 ENCOUNTER — Emergency Department (HOSPITAL_COMMUNITY): Payer: 59

## 2011-10-28 ENCOUNTER — Encounter (HOSPITAL_COMMUNITY): Payer: Self-pay | Admitting: Family Medicine

## 2011-10-28 ENCOUNTER — Other Ambulatory Visit: Payer: Self-pay

## 2011-10-28 ENCOUNTER — Inpatient Hospital Stay (HOSPITAL_COMMUNITY)
Admission: EM | Admit: 2011-10-28 | Discharge: 2011-10-30 | DRG: 069 | Disposition: A | Discharge status: Home / Self Care | Payer: 59 | Source: Ambulatory Visit | Attending: Family Medicine | Admitting: Family Medicine

## 2011-10-28 DIAGNOSIS — K219 Gastro-esophageal reflux disease without esophagitis: Secondary | ICD-10-CM

## 2011-10-28 DIAGNOSIS — J310 Chronic rhinitis: Secondary | ICD-10-CM

## 2011-10-28 DIAGNOSIS — F3289 Other specified depressive episodes: Secondary | ICD-10-CM

## 2011-10-28 DIAGNOSIS — E669 Obesity, unspecified: Secondary | ICD-10-CM

## 2011-10-28 DIAGNOSIS — S335XXA Sprain of ligaments of lumbar spine, initial encounter: Secondary | ICD-10-CM | POA: Diagnosis present

## 2011-10-28 DIAGNOSIS — I639 Cerebral infarction, unspecified: Secondary | ICD-10-CM

## 2011-10-28 DIAGNOSIS — R519 Headache, unspecified: Secondary | ICD-10-CM | POA: Insufficient documentation

## 2011-10-28 DIAGNOSIS — M26609 Unspecified temporomandibular joint disorder, unspecified side: Secondary | ICD-10-CM | POA: Diagnosis present

## 2011-10-28 DIAGNOSIS — R531 Weakness: Secondary | ICD-10-CM

## 2011-10-28 DIAGNOSIS — M26629 Arthralgia of temporomandibular joint, unspecified side: Secondary | ICD-10-CM

## 2011-10-28 DIAGNOSIS — I10 Essential (primary) hypertension: Secondary | ICD-10-CM | POA: Diagnosis present

## 2011-10-28 DIAGNOSIS — R5383 Other fatigue: Secondary | ICD-10-CM

## 2011-10-28 DIAGNOSIS — R030 Elevated blood-pressure reading, without diagnosis of hypertension: Secondary | ICD-10-CM | POA: Insufficient documentation

## 2011-10-28 DIAGNOSIS — I498 Other specified cardiac arrhythmias: Secondary | ICD-10-CM | POA: Diagnosis present

## 2011-10-28 DIAGNOSIS — R51 Headache: Secondary | ICD-10-CM

## 2011-10-28 DIAGNOSIS — R5381 Other malaise: Secondary | ICD-10-CM

## 2011-10-28 DIAGNOSIS — J069 Acute upper respiratory infection, unspecified: Secondary | ICD-10-CM

## 2011-10-28 DIAGNOSIS — J329 Chronic sinusitis, unspecified: Secondary | ICD-10-CM

## 2011-10-28 DIAGNOSIS — E785 Hyperlipidemia, unspecified: Secondary | ICD-10-CM | POA: Diagnosis present

## 2011-10-28 DIAGNOSIS — F329 Major depressive disorder, single episode, unspecified: Secondary | ICD-10-CM | POA: Diagnosis present

## 2011-10-28 DIAGNOSIS — Z87891 Personal history of nicotine dependence: Secondary | ICD-10-CM

## 2011-10-28 DIAGNOSIS — R109 Unspecified abdominal pain: Secondary | ICD-10-CM

## 2011-10-28 DIAGNOSIS — G459 Transient cerebral ischemic attack, unspecified: Principal | ICD-10-CM | POA: Diagnosis present

## 2011-10-28 DIAGNOSIS — E7800 Pure hypercholesterolemia, unspecified: Secondary | ICD-10-CM

## 2011-10-28 LAB — RAPID URINE DRUG SCREEN, HOSP PERFORMED
Cocaine: NOT DETECTED
Opiates: NOT DETECTED

## 2011-10-28 LAB — CK TOTAL AND CKMB (NOT AT ARMC)
CK, MB: 1.4 ng/mL (ref 0.3–4.0)
Relative Index: INVALID (ref 0.0–2.5)

## 2011-10-28 LAB — CBC
MCHC: 33.9 g/dL (ref 30.0–36.0)
MCV: 85.4 fL (ref 78.0–100.0)
Platelets: 274 10*3/uL (ref 150–400)
RDW: 13.3 % (ref 11.5–15.5)
WBC: 9.6 10*3/uL (ref 4.0–10.5)

## 2011-10-28 LAB — GLUCOSE, CAPILLARY: Glucose-Capillary: 125 mg/dL — ABNORMAL HIGH (ref 70–99)

## 2011-10-28 LAB — DIFFERENTIAL
Basophils Absolute: 0 10*3/uL (ref 0.0–0.1)
Basophils Relative: 0 % (ref 0–1)
Eosinophils Absolute: 0.1 10*3/uL (ref 0.0–0.7)
Eosinophils Relative: 1 % (ref 0–5)
Lymphocytes Relative: 38 % (ref 12–46)

## 2011-10-28 LAB — COMPREHENSIVE METABOLIC PANEL
ALT: 20 U/L (ref 0–35)
AST: 22 U/L (ref 0–37)
Albumin: 4.3 g/dL (ref 3.5–5.2)
CO2: 24 mEq/L (ref 19–32)
Calcium: 9.5 mg/dL (ref 8.4–10.5)
Sodium: 134 mEq/L — ABNORMAL LOW (ref 135–145)
Total Protein: 7.8 g/dL (ref 6.0–8.3)

## 2011-10-28 LAB — ANTITHROMBIN III: AntiThromb III Func: 116 % (ref 75–120)

## 2011-10-28 LAB — APTT: aPTT: 29 seconds (ref 24–37)

## 2011-10-28 LAB — TROPONIN I: Troponin I: <0.3 ng/mL (ref <0.30)

## 2011-10-28 LAB — PROTIME-INR: Prothrombin Time: 13.7 seconds (ref 11.6–15.2)

## 2011-10-28 MED ORDER — MORPHINE SULFATE 2 MG/ML IJ SOLN
1.0000 mg | Freq: Once | INTRAMUSCULAR | Status: AC
  Administered 2011-10-28: 1 mg via INTRAVENOUS
  Filled 2011-10-28: qty 1

## 2011-10-28 MED ORDER — ASPIRIN 81 MG PO CHEW
CHEWABLE_TABLET | ORAL | Status: AC
  Administered 2011-10-28: 324 mg
  Filled 2011-10-28: qty 4

## 2011-10-28 MED ORDER — TRAMADOL HCL 50 MG PO TABS
50.0000 mg | ORAL_TABLET | Freq: Four times a day (QID) | ORAL | Status: DC | PRN
  Administered 2011-10-28 – 2011-10-29 (×4): 50 mg via ORAL
  Filled 2011-10-28 (×4): qty 1

## 2011-10-28 MED ORDER — ASPIRIN 325 MG PO TABS
324.0000 mg | ORAL_TABLET | Freq: Every day | ORAL | Status: DC
  Administered 2011-10-29: 324 mg via ORAL
  Administered 2011-10-30: 10:00:00 via ORAL
  Filled 2011-10-28 (×2): qty 1

## 2011-10-28 MED ORDER — FLUTICASONE PROPIONATE 50 MCG/ACT NA SUSP
1.0000 | Freq: Every day | NASAL | Status: DC
  Administered 2011-10-29: 1 via NASAL
  Filled 2011-10-28: qty 16

## 2011-10-28 MED ORDER — VITAMIN B-12 100 MCG PO TABS
100.0000 ug | ORAL_TABLET | Freq: Every day | ORAL | Status: DC
  Administered 2011-10-29 – 2011-10-30 (×2): 100 ug via ORAL
  Filled 2011-10-28 (×3): qty 1

## 2011-10-28 MED ORDER — VITAMIN C 500 MG PO TABS
1000.0000 mg | ORAL_TABLET | Freq: Every day | ORAL | Status: DC
  Administered 2011-10-29: 1000 mg via ORAL
  Administered 2011-10-30: 500 mg via ORAL
  Filled 2011-10-28 (×3): qty 2

## 2011-10-28 MED ORDER — ASPIRIN 325 MG PO TABS: 325.0000 mg | ORAL_TABLET | Freq: Every day | ORAL | Status: DC

## 2011-10-28 NOTE — ED Notes (Signed)
Code Stroke cancelled.  

## 2011-10-28 NOTE — ED Provider Notes (Addendum)
History     CSN: [id number]  Arrival date & time 10/28/11  1435   First MD Initiated Contact with Patient 10/28/11 1447      Chief Complaint  Patient presents with  . Code Stroke    (Consider location/radiation/quality/duration/timing/severity/associated sxs/prior treatment) Patient is a 46 y.o. female presenting with weakness. The history is provided by the patient and the EMS personnel.  Weakness The primary symptoms include headaches, paresthesias and focal weakness. Primary symptoms do not include loss of consciousness, visual change, fever, nausea or vomiting. The symptoms began less than 1 hour ago. The episode lasted 1 hour. The symptoms are unchanged. The neurological symptoms are focal. Context: nothing.  The headache is associated with paresthesias and weakness. The headache is not associated with visual change.  Weakness began less than 1 hour ago. The weakness is unchanged. There is no ability to contract the muscle with maximum physical effort.  Region/motion of weakness: left face, arm and leg. There is impairment of the following actions: standing up and walking. There is no impairment of the following actions: moving eyes or rotating head.  Additional symptoms include weakness. Associated symptoms comments: Pain behind the left eye. Medical issues do not include seizures.    Past Medical History  Diagnosis Date  . Depression   . Fatigue   . Chronic headache   . TMJ pain dysfunction syndrome   . Obesity   . Sinusitis   . Hypertension   . Hypercholesterolemia     Past Surgical History  Procedure Date  . Back surgery     x2  . Torn disk l4 l5 s1   . Nasal sinus surgery   . Vaginal hysterectomy     Family History  Problem Relation Age of Onset  . Cervical cancer Maternal Grandmother   . Cancer Maternal Grandmother     mouth  . Hypertension Brother   . Heart failure Brother     History  Substance Use Topics  . Smoking status: Former Smoker -- 20 years     Types: Cigarettes    Quit date: 07/28/2003  . Smokeless tobacco: Not on file   Comment: 3/4 of pack  . Alcohol Use: No    OB History    Grav Para Term Preterm Abortions TAB SAB Ect Mult Living                  Review of Systems  Constitutional: Negative for fever.  Gastrointestinal: Negative for nausea and vomiting.  Neurological: Positive for focal weakness, weakness, headaches and paresthesias. Negative for loss of consciousness.  All other systems reviewed and are negative.    Allergies  Oxycodone-acetaminophen and Tylox  Home Medications   Current Outpatient Rx  Name Route Sig Dispense Refill  . ACETAMINOPHEN 500 MG PO TABS Oral Take 1,000 mg by mouth every 6 (six) hours as needed. Headache pain or fever     . AMOXICILLIN 500 MG PO CAPS Oral Take 1,000 mg by mouth 3 (three) times daily.      . CYCLOBENZAPRINE HCL 10 MG PO TABS Oral Take 10 mg by mouth 3 (three) times daily as needed.      Marland Kitchen DM-GUAIFENESIN ER 30-600 MG PO TB12 Oral Take 1 tablet by mouth daily.      Marland Kitchen HYDROCODONE-HOMATROPINE 5-1.5 MG/5ML PO SYRP Oral Take 5 mLs by mouth every 6 (six) hours as needed. cough     . LISINOPRIL-HYDROCHLOROTHIAZIDE 10-12.5 MG PO TABS Oral Take 1 tablet by mouth daily.      Marland Kitchen  MOMETASONE FUROATE 50 MCG/ACT NA SUSP Nasal Place 2 sprays into the nose daily.      . TRAMADOL HCL 50 MG PO TABS Oral Take 1 tablet (50 mg total) by mouth every 6 (six) hours as needed. 45 tablet 1    SpO2 100%  Physical Exam  Nursing note and vitals reviewed. Constitutional: She is oriented to person, place, and time. She appears well-developed and well-nourished. No distress.  HENT:  Head: Normocephalic and atraumatic.  Mouth/Throat: Oropharynx is clear and moist.  Eyes: EOM are normal. Pupils are equal, round, and reactive to light.  Cardiovascular: Normal rate, regular rhythm, normal heart sounds and intact distal pulses.  Exam reveals no friction rub.   No murmur heard. Pulmonary/Chest:  Effort normal and breath sounds normal. She has no wheezes. She has no rales.  Abdominal: Soft. Bowel sounds are normal. She exhibits no distension. There is no tenderness. There is no rebound and no guarding.  Musculoskeletal: Normal range of motion. She exhibits no tenderness.       No edema  Neurological: She is alert and oriented to person, place, and time. A sensory deficit is present. No cranial nerve deficit.       Unable to raise the left arm and leg to gravity.  Decreased sensation in the left arm, face, leg.  Skin: Skin is warm and dry. No rash noted.  Psychiatric: She has a normal mood and affect. Her behavior is normal.    ED Course  Procedures (including critical care time)   Labs Reviewed  PROTIME-INR  APTT  CBC  DIFFERENTIAL   Ct Head Wo Contrast  10/28/2011  *RADIOLOGY REPORT*  Clinical Data: Code stroke  CT HEAD WITHOUT CONTRAST  Technique:  Contiguous axial images were obtained from the base of the skull through the vertex without contrast.  Comparison: None  Findings: The brain has a normal appearance without evidence for hemorrhage, infarction, hydrocephalus, or mass lesion.  There is no extra axial fluid collection.  The skull and paranasal sinuses are normal.  IMPRESSION: No acute intracranial abnormalities.  No evidence for stroke.  Original Report Authenticated By: Rosealee Albee, M.D.    Date: 10/28/2011  Rate: 103  Rhythm: sinus tachycardia  QRS Axis: normal  Intervals: QT prolonged  ST/T Wave abnormalities: nonspecific ST changes  Conduction Disutrbances:none  Narrative Interpretation:   Old EKG Reviewed: unchanged    1. Left-sided weakness   2. Headache       MDM   Patient arrived via code stroke. With left-sided facial droop and weakness. Stroke team available upon the patient's arrival. She had persistent left-sided droop and unable to move her left arm or leg. Decreased sensation but is able to sense me touching her. They should taken to the  CT scan her for further evaluation. Blood sugar was 88. She has a history of hypertension and hypercholesterolemia but no diabetes and no prior strokes.  3:05 PM Dr. Roseanne Reno at bedside CT of the head was negative and at this time they are taking her first at MRI, MRA before making a decision about anticoagulation.  Labs wnl.        Gwyneth Sprout, MD 10/28/11 1505  Gwyneth Sprout, MD 10/28/11 1525  Gwyneth Sprout, MD 10/28/11 1526

## 2011-10-28 NOTE — H&P (Signed)
PCP:   Cain Saupe, MD, MD   Chief Complaint:  Was at work and went to take a break at about 1:30 pm and was dizzy before she got up, and when she was walking back , she states that the room was spinning and when she sat back down at her desk, the dizzyness (room spinning around her) started to get worse and the supervisor came over , the L side of her face was also feeling a bit funny.  Still feels numb now.  Her speech was slightly slurred and her supervisor couldn't understand what she was saying and she was brought over to the Ed with an initial NIHSS of 6.  TPa not given as there was no clear objective issues related to ?CVA.  She states that as she went to her car she she couldn't hold her head up and couldn't feel her fingers on her L side and felt "little needles" on her L side as well.  Felt like she got weak on the L side as well.  Her supervisor states she was talking a little funny, but no noted seizures.  Did feel like she was about to fall. Bad headache on return from CT/MRI-Headache is behiond the eyes, more so on the Headache is constant, no blurred vision or double vision. L siode.  Has a h/o migraine-usually takes nothing for this and takes ibuproen for this., maybe 5-6 yrs ago was the last attack.  This doesnt feel like a migraine to her. Had a "heaviness on her L side" and l breast area.   Review of Systems:  The patient denies fever, weight loss, vision loss, decreased hearing, hoarseness, chest pain, syncope, dyspnea on exertion, peripheral edema, balance deficits, hemoptysis, abdominal pain, melena, hematochezia, severe indigestion/heartburn, hematuria, incontinence, genital sores, muscle weakness, suspicious skin lesions, transient blindness, difficulty walking, depression, u Past Medical History: Past Medical History  Diagnosis Date  . Depression   . Fatigue   . Chronic headache   . TMJ pain dysfunction syndrome   . Obesity   . Sinusitis   . Hypertension   .  Hypercholesterolemia     Past surgical history: Past Surgical History  Procedure Date  . Back surgery     x2  . Torn disk l4 l5 s1     surgery by [facility]ritzer 9/11 +10/11  . Nasal sinus surgery   . Vaginal hysterectomy     for DUB in 10/2003  . Cholecystectomy     11/19/09    Medications: Prior to Admission medications   Medication Sig Start Date End Date Taking? Authorizing Provider  acetaminophen (TYLENOL) 500 MG tablet Take 1,000 mg by mouth every 6 (six) hours as needed. Headache pain or fever    Yes Historical Provider, MD  cyclobenzaprine (FLEXERIL) 10 MG tablet Take 10 mg by mouth 3 (three) times daily as needed. For muscle spasms   Yes Historical Provider, MD  lisinopril-hydrochlorothiazide (PRINZIDE,ZESTORETIC) 10-12.5 MG per tablet Take 1 tablet by mouth daily.     Yes Historical Provider, MD  traMADol (ULTRAM) 50 MG tablet Take 50 mg by mouth every 6 (six) hours as needed. For pain 04/10/11  Yes Tammy S Parrett, NP  vitamin B-12 (CYANOCOBALAMIN) 100 MCG tablet Take 100 mcg by mouth daily.   Yes Historical Provider, MD  vitamin C (ASCORBIC ACID) 500 MG tablet Take 1,000 mg by mouth daily.   Yes Historical Provider, MD    Allergies:   Allergies  Allergen Reactions  . Oxycodone-Acetaminophen Itching  Patient states she is allergic to the inactive ingredient  . Tylox Hives    Patient states she is allergic to the inactive ingredients    Social History:  reports that she quit smoking about 8 years ago. Her smoking use included Cigarettes. She quit after 20 years of use. She does not have any smokeless tobacco history on file. She reports that she does not drink alcohol or use illicit drugs.  Family History: Family History  Problem Relation Age of Onset  . Cervical cancer Maternal Grandmother   . Cancer Maternal Grandmother     mouth  . Hypertension Brother   . Heart failure Brother     Physical Exam: Filed Vitals:   10/28/11 1447 10/28/11 1502 10/28/11 1503  BP:   121/66   Pulse:  105   Temp:   98 F (36.7 C)  TempSrc:   Oral  Resp:  14   SpO2: 100% 96%     HEENT-alert, some twisting of mouth to the L side CHEST-cta b, no added sound CARDS-s1s2no m/r/g, tele sinus tach 100 ABD-soft, nt,nd SKIN-grossly intact. No LE swelling NEURO-alert, oriented x3 speech: normal in context and clarity memory: intact grossly cranial nerves II-XII: intact no involuntary movements or tremors cerebellar: finger-to-nose and heel-to-shin intact plantar responses: downgoing bilaterally sensationm slighlty altered on outside of LLE, and on Left outer extnesor aspect.  Some decreased sensation to L sidde face Power 3/5 to LUE, 3/5 LLE, but poor effort.  Reflexes equivocal.  Plantar flexion and extension signif weaker on the L side Gait n/a but noted patient able to ge tto bedside commode with some assistance   Labs on Admission:   [facility] 10/28/11 1448  NA 134*  K 3.3*  CL 98  CO2 24  GLUCOSE 120*  BUN 10  CREATININE 0.70  CALCIUM 9.5  MG --  PHOS --    Basename 10/28/11 1448  AST 22  ALT 20  ALKPHOS 131*  BILITOT 0.2*  PROT 7.8  ALBUMIN 4.3   No results found for this basename: LIPASE:2,AMYLASE:2 in the last 72 hours  Basename 10/28/11 1448  WBC 9.6  NEUTROABS 5.2  HGB 13.1  HCT 38.6  MCV 85.4  PLT 274    Basename 10/28/11 1449  CKTOTAL 86  CKMB 1.4  CKMBINDEX --  TROPONINI <0.30   No results found for this basename: TSH,T4TOTAL,FREET3,T3FREE,THYROIDAB in the last 72 hours No results found for this basename: VITAMINB12:2,FOLATE:2,FERRITIN:2,TIBC:2,IRON:2,RETICCTPCT:2 in the last 72 hours  Radiological Exams on Admission: Ct Head Wo Contrast  10/28/2011  *RADIOLOGY REPORT*  Clinical Data: Code stroke  CT HEAD WITHOUT CONTRAST  Technique:  Contiguous axial images were obtained from the base of the skull through the vertex without contrast.  Comparison: None  Findings: The brain has a normal appearance without evidence for  hemorrhage, infarction, hydrocephalus, or mass lesion.  There is no extra axial fluid collection.  The skull and paranasal sinuses are normal.  IMPRESSION: No acute intracranial abnormalities.  No evidence for stroke.  Original Report Authenticated By: Rosealee Albee, M.D.   Mr [facility] Wo Contrast  10/28/2011  *RADIOLOGY REPORT*  Clinical Data:  Code stroke.  Left-sided weakness.  MRI HEAD WITHOUT CONTRAST MRA HEAD WITHOUT CONTRAST  Technique:  Multiplanar, multiecho pulse sequences of the brain and surrounding structures were obtained without intravenous contrast. Angiographic images of the head were obtained using MRA technique without contrast.  Comparison:  CT head earlier in the day.  MRI HEAD  Findings:  There  is no evidence for acute infarction, intracranial hemorrhage, mass lesion, hydrocephalus, or extra-axial fluid. There is no atrophy or white matter disease.  No areas of chronic hemorrhage.  Slight tonsillar ectopia.  Normal pituitary. Unremarkable visualized upper cervical region. No change from prior normal CT.  IMPRESSION: Negative.  MRA HEAD  Findings: Widely patent carotid and basilar arteries.  The vertebral arteries are codominant.  There is no intracranial stenosis or aneurysm.  IMPRESSION: Negative.  Original Report Authenticated By: Elsie Stain, M.D.   Mr Brain Wo Contrast  10/28/2011  *RADIOLOGY REPORT*  Clinical Data:  Code stroke.  Left-sided weakness.  MRI HEAD WITHOUT CONTRAST MRA HEAD WITHOUT CONTRAST  Technique:  Multiplanar, multiecho pulse sequences of the brain and surrounding structures were obtained without intravenous contrast. Angiographic images of the head were obtained using MRA technique without contrast.  Comparison:  CT head earlier in the day.  MRI HEAD  Findings:  There is no evidence for acute infarction, intracranial hemorrhage, mass lesion, hydrocephalus, or extra-axial fluid. There is no atrophy or white matter disease.  No areas of chronic hemorrhage.   Slight tonsillar ectopia.  Normal pituitary. Unremarkable visualized upper cervical region. No change from prior normal CT.  IMPRESSION: Negative.  MRA HEAD  Findings: Widely patent carotid and basilar arteries.  The vertebral arteries are codominant.  There is no intracranial stenosis or aneurysm.  IMPRESSION: Negative.  Original Report Authenticated By: Elsie Stain, M.D.    Assessment/Plan Patient Active Problem List  Diagnoses  . CVA (cerebral infarction)? vs Atypical Migraine-will work-up as possible TIa and add Hypercoagulable work-up.  Unclear if there is any other etiology going on such as atypical migraine-this doesn;t sound like the case and I agree that with the dizzyness component, there could be Cerebellar/basal Ganglial involvement, although should have been seen on MRI Will ask pt/ot to assess Neurology has evaluated and will see on follow-up Patient states her mother had a cva at age 67 and has significant cad h/o in family, + she also used to be a smoker. Further risk factor modification as below  She has had a neg Ct scan head, Neg MRI/A so far this admit  Appreciate neuro input    . OBESITY-out-patient re-assesment.  Consider referral to dietician   . DEPRESSION-not on any specific meds.  Monitor.  Unclear if this could be a conversion reaction.  Hold on psych eval until other organic causes ruled out  . CHRONIC RHINITIS-continue Flonase  . TMJ PAIN-Tramadol.  Note that she is on this as well as Cyclobenzaprine for LBP.  Marland Kitchen HEADACHE, CHRONIC-Could have a migraine variant  . ABDOMINAL PAIN, UNSPECIFIED  . Lumbar L5 diskektomy-follow-up with Neurosurgeon  . TOBACCO ABUSE, HX OF-stable-quit 2005.  congratulated  . Sinusitis-flonase  . Borderline blood pressure-supposed ot be on Lisinopril-hctz.  Will ghold and re-implement in 24 hours    Keyshon Stein,JAI 10/28/2011, 4:41 PM

## 2011-10-28 NOTE — ED Notes (Signed)
Code stroke encoded 14:28, Code Stroke called 14:28, pt arrived to ED @ 14:35, EDP exam completed at 14:35, Stroke Team arrived @ 14:35, phlebotomy arrived @ 14:42, pt arrived to CT @ 14:44, Dr. Roseanne Reno @ bedside, call made to MRI @ 15:05, awaiting MRI procedure, pt on cardiac monitor, will continue to monitor, NAD

## 2011-10-28 NOTE — Consult Note (Addendum)
Chief Complaint: Slurred speech and left arm and leg weakness  HPI: Wanda [name] is an 46 y.o. female a history of hypertension presenting with acute onset of slurred speech and left arm and leg weakness with onset at 1:45 PM today. She also experienced numbness involving left extremities left side of her face. She has no previous history of stroke or TIA. She has not been on antiplatelet therapy. CT scan of her head showed no acute intracranial abnormality. MRI of the brain showed no acute intracranial abnormality including no signs of acute stroke. MRA of the brain was normal. Patient was admitted for evaluation for TIA. Weakness of left arm and leg had begun to resolve following MRI study. NIH stroke score was 6. It was noted that her effort with manual testing of left extremities was variable.  LSN: 1345 today tPA Given: No: No clear objective neurologic deficits; rapid improvement. MRankin: 0  Past Medical History  Diagnosis Date  . Depression   . Fatigue   . Chronic headache   . TMJ pain dysfunction syndrome   . Obesity   . Sinusitis   . Hypertension   . Hypercholesterolemia     Family History  Problem Relation Age of Onset  . Cervical cancer Maternal Grandmother   . Cancer Maternal Grandmother     mouth  . Hypertension Brother   . Heart failure Brother      Medications: Prior to Admission:  Flexeril 10 mg 3 times a day when necessary lisinopril/hydrochlorothiazide one tablet daily Ultram a.m. 50 mg every 6 hours when necessary vitamin B12 100 mcg per day and vitamin C 500 mg per day  Physical Examination: Blood pressure 121/66, pulse 105, temperature 98 F (36.7 C), temperature source Oral, resp. rate 14, SpO2 96.00%.  Neurologic Examination: Mental Status: Alert, oriented, thought content appropriate.  Speech was slow, but without significant dysarthria. Able to follow commands without difficulty. Poor effort with manual testing of left  extremities. Cranial Nerves: II-Visual fields intact. III/IV/VI-Extraocular movements full and conjugate.  Pupils reacted normally to light. V/VII-reduced perception of tactile sensation over the left side of the face; no facial weakness. VIII-grossly intact X-normal with no dysarthria; normal swallowing XII-midline tongue extension Motor: Poor effort with manual testing of left extremities. Patient had at least 4/5 strength of her left upper extremity and at least 2/5 strength of left lower extremity. Patient, however, was able to maneuver herself onto a bedpan without difficulty. Strength of her right extremities was normal. Sensory: Reduced perception of all sensory modalities involving left side. Deep Tendon Reflexes: 2+ and symmetric throughout Plantars: Downgoing bilaterally Cerebellar: Normal finger-to-nose and heel-to-shin test with use of right extremities.     Ct Head Wo Contrast  10/28/2011  *RADIOLOGY REPORT*  Clinical Data: Code stroke  CT HEAD WITHOUT CONTRAST  Technique:  Contiguous axial images were obtained from the base of the skull through the vertex without contrast.  Comparison: None  Findings: The brain has a normal appearance without evidence for hemorrhage, infarction, hydrocephalus, or mass lesion.  There is no extra axial fluid collection.  The skull and paranasal sinuses are normal.  IMPRESSION: No acute intracranial abnormalities.  No evidence for stroke.  Original Report Authenticated By: Rosealee Albee, M.D.   Mr [facility] Wo Contrast  10/28/2011  *RADIOLOGY REPORT*  Clinical Data:  Code stroke.  Left-sided weakness.  MRI HEAD WITHOUT CONTRAST MRA HEAD WITHOUT CONTRAST  Technique:  Multiplanar, multiecho pulse sequences of the brain and surrounding structures were  obtained without intravenous contrast. Angiographic images of the head were obtained using MRA technique without contrast.  Comparison:  CT head earlier in the day.  MRI HEAD  Findings:  There is no evidence  for acute infarction, intracranial hemorrhage, mass lesion, hydrocephalus, or extra-axial fluid. There is no atrophy or white matter disease.  No areas of chronic hemorrhage.  Slight tonsillar ectopia.  Normal pituitary. Unremarkable visualized upper cervical region. No change from prior normal CT.  IMPRESSION: Negative.  MRA HEAD  Findings: Widely patent carotid and basilar arteries.  The vertebral arteries are codominant.  There is no intracranial stenosis or aneurysm.  IMPRESSION: Negative.  Original Report Authenticated By: Elsie Stain, M.D.   Mr Brain Wo Contrast  10/28/2011  *RADIOLOGY REPORT*  Clinical Data:  Code stroke.  Left-sided weakness.  MRI HEAD WITHOUT CONTRAST MRA HEAD WITHOUT CONTRAST  Technique:  Multiplanar, multiecho pulse sequences of the brain and surrounding structures were obtained without intravenous contrast. Angiographic images of the head were obtained using MRA technique without contrast.  Comparison:  CT head earlier in the day.  MRI HEAD  Findings:  There is no evidence for acute infarction, intracranial hemorrhage, mass lesion, hydrocephalus, or extra-axial fluid. There is no atrophy or white matter disease.  No areas of chronic hemorrhage.  Slight tonsillar ectopia.  Normal pituitary. Unremarkable visualized upper cervical region. No change from prior normal CT.  IMPRESSION: Negative.  MRA HEAD  Findings: Widely patent carotid and basilar arteries.  The vertebral arteries are codominant.  There is no intracranial stenosis or aneurysm.  IMPRESSION: Negative.  Original Report Authenticated By: Elsie Stain, M.D.    Assessment: 46 y.o. female with possible right middle cerebral artery territory transient ischemic attack.  Stroke Risk Factors - family history and hypertension  Plan: 1. HgbA1c, fasting lipid panel 2. MRI, MRA  of the brain without contrast 3. PT consult, OT consult, Speech consult 4. Echocardiogram 5. Carotid dopplers 6. Prophylactic  therapy-Antiplatelet med: Aspirin - 325 mg per day 7. Risk factor modification 8. Telemetry monitoring  C.R. Roseanne Reno, MD [facility] [phone]  10/28/2011, 4:30 PM   Addendum:  Because of her age she will also need studies to rule out hypercoagulable state.  CR Margaretha Glassing.D.

## 2011-10-28 NOTE — ED Notes (Signed)
Pt in per [facility], pt was at work at [employer], pt stood up & started having severe HA, L sharp eye pain, per EMS report pt has L facial droop, L arm drift, with slurred speech, pt A&O x4, pt hx of HTN

## 2011-10-28 NOTE — Code Documentation (Signed)
Code stroke called at 1428, encoded at 1428, Pt arrived to [facility] ED at 1435, Stroke team arrival at 1435, EDP seen at 1435, Pt arrival in CT at 73, Lab arrived at 56.  Pt was LSN at 1345, as per pt was sitting at her desk at work and she became very dizzy, went to get up and noticed left side was weak and had a severe headache.  NIHSS 9.  Called MRI, awaiting to go get MRI/MRA.

## 2011-10-28 NOTE — ED Notes (Signed)
Pt in MRI.

## 2011-10-29 ENCOUNTER — Encounter (HOSPITAL_COMMUNITY): Payer: Self-pay | Admitting: *Deleted

## 2011-10-29 DIAGNOSIS — G459 Transient cerebral ischemic attack, unspecified: Secondary | ICD-10-CM

## 2011-10-29 LAB — LIPID PANEL
Total CHOL/HDL Ratio: 4.1 RATIO
VLDL: 20 mg/dL (ref 0–40)

## 2011-10-29 LAB — GLUCOSE, CAPILLARY: Glucose-Capillary: 101 mg/dL — ABNORMAL HIGH (ref 70–99)

## 2011-10-29 LAB — LUPUS ANTICOAGULANT PANEL: Lupus Anticoagulant: NOT DETECTED

## 2011-10-29 LAB — BETA-2-GLYCOPROTEIN I ABS, IGG/M/A
Beta-2-Glycoprotein I IgA: 7 A Units (ref <20)
Beta-2-Glycoprotein I IgM: 9 M Units (ref <20)

## 2011-10-29 LAB — CARDIOLIPIN ANTIBODIES, IGG, IGM, IGA: Anticardiolipin IgM: 11 MPL U/mL — ABNORMAL HIGH (ref <11)

## 2011-10-29 LAB — PROTEIN S ACTIVITY: Protein S Activity: 154 % — ABNORMAL HIGH (ref 69–129)

## 2011-10-29 MED ORDER — LISINOPRIL 10 MG PO TABS
10.0000 mg | ORAL_TABLET | Freq: Every day | ORAL | Status: DC
  Administered 2011-10-29 – 2011-10-30 (×2): 10 mg via ORAL
  Filled 2011-10-29 (×3): qty 1

## 2011-10-29 MED ORDER — ISOMETHEPTENE-APAP-DICHLORAL 65-325-100 MG PO CAPS
1.0000 | ORAL_CAPSULE | ORAL | Status: DC | PRN
  Administered 2011-10-29: 1 via ORAL
  Filled 2011-10-29 (×2): qty 1

## 2011-10-29 MED ORDER — LISINOPRIL-HYDROCHLOROTHIAZIDE 10-12.5 MG PO TABS
1.0000 | ORAL_TABLET | Freq: Every day | ORAL | Status: DC
Start: 2011-10-29 — End: 2011-10-29

## 2011-10-29 MED ORDER — FLUTICASONE PROPIONATE 50 MCG/ACT NA SUSP
1.0000 | Freq: Every day | NASAL | Status: DC
  Administered 2011-10-30: 1 via NASAL
  Filled 2011-10-29: qty 16

## 2011-10-29 MED ORDER — STROKE: EARLY STAGES OF RECOVERY BOOK
Freq: Once | Status: AC
  Administered 2011-10-29: 1
  Filled 2011-10-29: qty 1

## 2011-10-29 MED ORDER — HYDROCHLOROTHIAZIDE 12.5 MG PO CAPS
12.5000 mg | ORAL_CAPSULE | Freq: Every day | ORAL | Status: DC
  Administered 2011-10-29 – 2011-10-30 (×2): 12.5 mg via ORAL
  Filled 2011-10-29 (×3): qty 1

## 2011-10-29 NOTE — Progress Notes (Signed)
Stroke Team Progress Note  HISTORY Wanda [name] is an 46 y.o. female a history of hypertension presenting with acute onset of slurred speech and left arm and leg weakness with onset at 1:45 PM today. She also experienced numbness involving left extremities left side of her face. She has no previous history of stroke or TIA. She has not been on antiplatelet therapy. CT scan of her head showed no acute intracranial abnormality. MRI of the brain showed no acute intracranial abnormality including no signs of acute stroke. MRA of the brain was normal. Patient was admitted for evaluation for TIA. Weakness of left arm and leg had begun to resolve following MRI study. NIH stroke score was 6. It was noted that her effort with manual testing of left extremities was variable. Patient was not a TPA candidate secondary to no clear objective neurologic deficits; rapid improvement. she was admitted for further evaluation and treatment.  SUBJECTIVE No family is at the bedside. Overall she feels her condition is unchanged. She reports "waking up numb" in the recent past.  OBJECTIVE Filed Vitals:   10/28/11 1846 10/28/11 2033 10/29/11 0248 10/29/11 0706  BP: 122/77 113/70 106/70 116/77  Pulse: 81 89 88 92  Temp: 98.2 F (36.8 C) 98.3 F (36.8 C) 97.9 F (36.6 C) 97.9 F (36.6 C)  TempSrc: Oral Oral Oral Oral  Resp: 16 18 20 20   SpO2: 98% 100% 94% 94%   CBG (last 3)  Basename 10/29/11 0702 10/28/11 2218 10/28/11 1729  GLUCAP 101* 125* 81   Intake/Output from previous day:   IV Fluid Intake:    Medications    .  stroke: mapping our early stages of recovery book   Does not apply Once  . aspirin      . aspirin  324 mg Oral Daily  . fluticasone  1 spray Each Nare Daily  .  morphine injection  1 mg Intravenous Once  . vitamin B-12  100 mcg Oral Daily  . vitamin C  1,000 mg Oral Daily  . DISCONTD: aspirin  325 mg Oral Daily  PRN traMADol  Diet:  Cardiac thin liquids Activity:  Bathroom  privileges DVT Prophylaxis:  SCDs   Significant Diagnostic Studies: CBC    Component Value Date/Time   WBC 9.6 10/28/2011 1448   RBC 4.52 10/28/2011 1448   HGB 13.1 10/28/2011 1448   HCT 38.6 10/28/2011 1448   PLT 274 10/28/2011 1448   MCV 85.4 10/28/2011 1448   MCH 29.0 10/28/2011 1448   MCHC 33.9 10/28/2011 1448   RDW 13.3 10/28/2011 1448   LYMPHSABS 3.6 10/28/2011 1448   MONOABS 0.7 10/28/2011 1448   EOSABS 0.1 10/28/2011 1448   BASOSABS 0.0 10/28/2011 1448   CMP    Component Value Date/Time   NA 134* 10/28/2011 1448   K 3.3* 10/28/2011 1448   CL 98 10/28/2011 1448   CO2 24 10/28/2011 1448   GLUCOSE 120* 10/28/2011 1448   BUN 10 10/28/2011 1448   CREATININE 0.70 10/28/2011 1448   CALCIUM 9.5 10/28/2011 1448   PROT 7.8 10/28/2011 1448   ALBUMIN 4.3 10/28/2011 1448   AST 22 10/28/2011 1448   ALT 20 10/28/2011 1448   ALKPHOS 131* 10/28/2011 1448   BILITOT 0.2* 10/28/2011 1448   GFRNONAA >90 10/28/2011 1448   GFRAA >90 10/28/2011 1448   COAGS Lab Results  Component Value Date   INR 1.03 10/28/2011   Lipid Panel    Component Value Date/Time   CHOL 188 10/29/2011 0535  TRIG 100 10/29/2011 0535   HDL 46 10/29/2011 0535   CHOLHDL 4.1 10/29/2011 0535   VLDL 20 10/29/2011 0535   LDLCALC 122* 10/29/2011 0535   HgbA1C  Lab Results  Component Value Date   HGBA1C 5.6 10/28/2011   Urine Drug Screen    Component Value Date/Time   LABOPIA NONE DETECTED 10/28/2011 1726   COCAINSCRNUR NONE DETECTED 10/28/2011 1726   LABBENZ NONE DETECTED 10/28/2011 1726   AMPHETMU NONE DETECTED 10/28/2011 1726   THCU NONE DETECTED 10/28/2011 1726   LABBARB NONE DETECTED 10/28/2011 1726    Alcohol Level No results found for this basename: eth     CT of the brain  10/28/2011  No acute intracranial abnormalities.  No evidence for stroke.   MRI of the brain  10/28/2011   Negative.  MRA of the brain  10/28/2011   Negative  2D Echocardiogram  EF 55-60% with no source of embolus.  Carotid Doppler  No internal carotid artery stenosis bilaterally. Vertebrals with  antegrade flow bilaterally.   EKG  sinus tachycardia, borderline QT interval.   Physical Exam    Awake alert. Afebrile. Head is nontraumatic. Neck is supple without bruit. Hearing is normal. Cardiac exam no murmur or gallop. Lungs are clear to auscultation. Distal pulses are well felt.   Neurological Exam Awake  Alert oriented x 3. Normal speech and language.eye movements full without nystagmus. Face symmetric. Tongue midline. Normal strength, tone, reflexes and coordination. Normal sensation. Gait deferred. Poor effort on left limits evaluation ASSESSMENT Ms. Wanda [name] is a 46 y.o. female with a presumed TIA, MRI negative for stroke. No signs on MS on MRI. ? Migraine variant given hx chronic headache. Neuro exam functional. Will complete TIA workup. Doubt neuro etiology. Has stroke risk factors.  On no antiplatelets prior admission. Now on aspirin 324 mg orally every day for  stroke prevention.   - obesity, class I Body mass index is 33.25 kg/(m^2). - chronic headache - hypertension - hyperlipidemia  - depression  Hospital day # 1  TREATMENT/PLAN - Continue aspirin 324 mg orally every day for stroke prevention. - complete stroke workup, therapy evaluations - plan discharge in am  Annie Main, AVNP, ANP-BC, GNP-BC [facility] Pager: [phone] 10/29/2011 10:58 AM  Dr. Delia Heady, Stroke Center Medical Director, has personally reviewed chart, pertinent data, examined the patient and developed the plan of care.

## 2011-10-29 NOTE — Progress Notes (Signed)
  Echocardiogram 2D Echocardiogram has been performed.  Wanda [name] 10/29/2011, 10:16 AM

## 2011-10-29 NOTE — Progress Notes (Signed)
   CARE MANAGEMENT NOTE 10/29/2011  Patient:  MARISSAH, VANDEMARK   Account Number:  [id number]  Date Initiated:  10/29/2011  Documentation initiated by:  Onnie Boer  Subjective/Objective Assessment:   PT WAS ADMITTED WITH CVA S/S     Action/Plan:   PROGRESSION OF CARE AND DISCHARGE PLANNING   Anticipated DC Date:  10/30/2011   Anticipated DC Plan:  HOME/SELF CARE  In-house referral  Clinical Social Worker      DC Planning Services  CM consult      Choice offered to / List presented to:             Status of service:  In process, will continue to follow Medicare Important Message given?   (If response is "NO", the following Medicare IM given date fields will be blank) Date Medicare IM given:   Date Additional Medicare IM given:    Discharge Disposition:    Per UR Regulation:  Reviewed for med. necessity/level of care/duration of stay  If discussed at Long Length of Stay Meetings, dates discussed:    Comments:  PCP: Dr. Jillyn Hidden 10/29/2011 1200 Darlyne Russian RN,, CCM [phone] Met with patient and family to discuss discharge planning. she lives at home with her daughter in a 2 story home. No previous home health service.   10/29/11 Onnie Boer, RN, BSN 1125 UR COMPLETED

## 2011-10-29 NOTE — Progress Notes (Signed)
*  PRELIMINARY RESULTS* Vascular Ultrasound Carotid Duplex (Doppler) has been completed.  Preliminary findings: Bilateral: 40-59% ICA stenosis. Vertebral artery flow is antegrade.  Cathie Beams Deneen 10/29/2011, 10:34 AM

## 2011-10-29 NOTE — Evaluation (Signed)
Physical Therapy Evaluation Patient Details Name: Wanda [name] MRN: [id number] DOB: 1965-08-01 Today's Date: 10/29/2011  Problem List:  Patient Active Problem List  Diagnoses  . HYPERCHOLESTEROLEMIA  . OBESITY  . DEPRESSION  . URI, ACUTE  . CHRONIC RHINITIS  . TMJ PAIN  . GERD  . FATIGUE  . HEADACHE, CHRONIC  . ABDOMINAL PAIN, UNSPECIFIED  . Lumbar L5 diskektomy  . TOBACCO ABUSE, HX OF  . Sinusitis  . Borderline blood pressure  . CVA (cerebral infarction)? vs Atypical Migraine    Past Medical History:  Past Medical History  Diagnosis Date  . Depression   . Fatigue   . Chronic headache   . TMJ pain dysfunction syndrome   . Obesity   . Sinusitis   . Hypertension   . Hypercholesterolemia    Past Surgical History:  Past Surgical History  Procedure Date  . Back surgery     x2  . Torn disk l4 l5 s1     surgery by Kritzer 9/11 +10/11  . Nasal sinus surgery   . Vaginal hysterectomy     for DUB in 10/2003  . Cholecystectomy     11/19/09    PT Assessment/Plan/Recommendation PT Assessment Clinical Impression Statement: Pt presents with a medical diagnosis of TIA. Pt with strength deficits limiting balance and mobility. Pt will benefit from skilled PT in the acute care setting in order to maximize functional mobility and safety prior to d/c PT Recommendation/Assessment: Patient will need skilled PT in the acute care venue PT Problem List: Decreased strength;Decreased activity tolerance;Decreased balance;Decreased mobility;Decreased knowledge of use of DME PT Therapy Diagnosis : Difficulty walking PT Plan PT Frequency: Min 4X/week PT Treatment/Interventions: DME instruction;Stair training;Gait training;Functional mobility training;Therapeutic activities;Therapeutic exercise;Balance training;Patient/family education PT Recommendation Follow Up Recommendations: No PT follow up;Supervision - Intermittent Equipment Recommended: None recommended by PT PT Goals  Acute  Rehab PT Goals PT Goal Formulation: With patient Time For Goal Achievement: 7 days Pt will go Supine/Side to Sit: with modified independence PT Goal: Supine/Side to Sit - Progress: Goal set today Pt will go Sit to Supine/Side: with modified independence PT Goal: Sit to Supine/Side - Progress: Goal set today Pt will go Sit to Stand: with modified independence PT Goal: Sit to Stand - Progress: Goal set today Pt will go Stand to Sit: with modified independence PT Goal: Stand to Sit - Progress: Goal set today Pt will Transfer Bed to Chair/Chair to Bed: with modified independence PT Transfer Goal: Bed to Chair/Chair to Bed - Progress: Goal set today Pt will Ambulate: >150 feet;with modified independence;with least restrictive assistive device PT Goal: Ambulate - Progress: Goal set today Pt will Go Up / Down Stairs: 6-9 stairs;with supervision;with rail(s) PT Goal: Up/Down Stairs - Progress: Goal set today  PT Evaluation Precautions/Restrictions  Precautions Precautions: Fall Restrictions Weight Bearing Restrictions: No Prior Functioning  Home Living Lives With: Daughter Receives Help From: Family Type of Home: Apartment Home Layout: Other (Comment) Home Access: Stairs to enter Entrance Stairs-Rails: Left Entrance Stairs-Number of Steps: 9 Bathroom Shower/Tub: Tub/shower unit;Curtain Firefighter: Standard Bathroom Accessibility: Yes How Accessible: Accessible via walker Home Adaptive Equipment: Shower chair with back;Bedside commode/3-in-1;Walker - rolling;Straight cane Prior Function Level of Independence: Independent with basic ADLs;Independent with gait;Independent with transfers Able to Take Stairs?: Yes Driving: Yes Vocation: Full time employment Vocation Requirements: [employer] Leisure: Hobbies-yes (Comment) Comments: bowling Cognition Cognition Arousal/Alertness: Awake/alert Overall Cognitive Status: Appears within functional limits for tasks assessed Orientation  Level: Oriented X4 Sensation/Coordination Sensation Light  Touch: Appears Intact Coordination Gross Motor Movements are Fluid and Coordinated: Yes Extremity Assessment RLE Assessment RLE Assessment: Within Functional Limits LLE Assessment LLE Assessment: Exceptions to [facility] LLE Strength Left Hip Flexion: 3+/5 Left Knee Flexion: 4/5 Left Knee Extension: 4/5 Left Ankle Dorsiflexion: 4/5 Left Ankle Plantar Flexion: 5/5 Mobility (including Balance) Bed Mobility Bed Mobility: Yes Supine to Sit: 6: Modified independent (Device/Increase time) Sitting - Scoot to Edge of Bed: 6: Modified independent (Device/Increase time) Transfers Transfers: Yes Sit to Stand: 5: Supervision;With upper extremity assist;From bed Sit to Stand Details (indicate cue type and reason): VC for hand placement for safety Stand to Sit: 5: Supervision;With upper extremity assist;To bed Stand to Sit Details: VC for hand placement Ambulation/Gait Ambulation/Gait: Yes Ambulation/Gait Assistance: 5: Supervision Ambulation/Gait Assistance Details (indicate cue type and reason): VC for proper sequencing. Pt moved slowly with a minimal limp although required no physical assist Ambulation Distance (Feet): 100 Feet Assistive device: None Gait Pattern: Step-to pattern;Decreased step length - right;Decreased stance time - left;Decreased hip/knee flexion - left;Decreased trunk rotation Gait velocity: decreased gait speed Stairs: Yes Stairs Assistance: 5: Supervision Stairs Assistance Details (indicate cue type and reason): VC for proper sequencing Stair Management Technique: One rail Left;Step to pattern;Forwards Number of Stairs: 5   Balance Balance Assessed: Yes Dynamic Standing Balance Dynamic Standing - Balance Support: No upper extremity supported;During functional activity Dynamic Standing - Level of Assistance: 5: Stand by assistance Dynamic Standing - Comments: SBA for safety due to L weakness. Pt able to self  correct without complete loss of balance Exercise    End of Session PT - End of Session Equipment Utilized During Treatment: Gait belt Activity Tolerance: Patient tolerated treatment well Patient left: in bed;with call bell in reach;with family/visitor present Nurse Communication: Mobility status for transfers;Mobility status for ambulation General Behavior During Session: [facility] for tasks performed Cognition: [facility] for tasks performed  Milana Kidney 10/29/2011, 5:07 PM  10/29/2011 Milana Kidney DPT PAGER: [phone] OFFICE: [phone]

## 2011-10-29 NOTE — Progress Notes (Signed)
Occupational Therapy Evaluation Patient Details Name: Wanda [name] MRN: [id number] DOB: Sep 05, 1965 Today's Date: 10/29/2011  Problem List:  Patient Active Problem List  Diagnoses  . HYPERCHOLESTEROLEMIA  . OBESITY  . DEPRESSION  . URI, ACUTE  . CHRONIC RHINITIS  . TMJ PAIN  . GERD  . FATIGUE  . HEADACHE, CHRONIC  . ABDOMINAL PAIN, UNSPECIFIED  . Lumbar L5 diskektomy  . TOBACCO ABUSE, HX OF  . Sinusitis  . Borderline blood pressure  . CVA (cerebral infarction)? vs Atypical Migraine    Past Medical History:  Past Medical History  Diagnosis Date  . Depression   . Fatigue   . Chronic headache   . TMJ pain dysfunction syndrome   . Obesity   . Sinusitis   . Hypertension   . Hypercholesterolemia    Past Surgical History:  Past Surgical History  Procedure Date  . Back surgery     x2  . Torn disk l4 l5 s1     surgery by Kritzer 9/11 +10/11  . Nasal sinus surgery   . Vaginal hysterectomy     for DUB in 10/2003  . Cholecystectomy     11/19/09    OT Assessment/Plan/Recommendation OT Assessment Clinical Impression Statement: Pt admitted with acute onset of slurred speech and left UE/LE weakness.  Will benefit from at least one more session of acute OT to address below problem list in prep for d/c home. OT Recommendation/Assessment: Patient will need skilled OT in the acute care venue OT Problem List: Decreased strength;Impaired sensation;Impaired UE functional use OT Therapy Diagnosis : Paresis OT Plan OT Frequency: Min 2X/week OT Treatment/Interventions: Therapeutic exercise;Therapeutic activities;Self-care/ADL training;Patient/family education OT Recommendation Follow Up Recommendations: Supervision - Intermittent Equipment Recommended: None recommended by OT Individuals Consulted Consulted and Agree with Results and Recommendations: Patient OT Goals Acute Rehab OT Goals OT Goal Formulation: With patient Time For Goal Achievement: 7 days Arm Goals Pt  Will Complete Theraputty Exer: Independently;Left upper extremity;Min resistance putty;to increase strength Arm Goal: Theraputty Exercises - Progress: Goal set today Additional Arm Goal #1: Pt will independently perform 2-3 fine motor activities on handout daily to increase LUE fine motor coordination. Arm Goal: Additional Goal #1 - Progress: Goal set today Miscellaneous OT Goals Miscellaneous OT Goal #1: Pt will independently incorporate LUE during ADL activity 75% of time. OT Goal: Miscellaneous Goal #1 - Progress: Goal set today  OT Evaluation Precautions/Restrictions  Precautions Precautions: Fall Restrictions Weight Bearing Restrictions: No Prior Functioning Home Living Lives With: Daughter Receives Help From: Family Type of Home: Apartment Home Layout: Other (Comment) Home Access: Stairs to enter Entrance Stairs-Rails: Left Entrance Stairs-Number of Steps: 9 Bathroom Shower/Tub: Tub/shower unit;Curtain Firefighter: Standard Bathroom Accessibility: Yes How Accessible: Accessible via walker Home Adaptive Equipment: Shower chair with back;Bedside commode/3-in-1;Walker - rolling;Straight cane Additional Comments: Educated pt on incorporating LUE during ADL activity to increase fine motor coordination. Prior Function Level of Independence: Independent with basic ADLs;Independent with gait;Independent with transfers Able to Take Stairs?: Yes Driving: Yes Vocation: Full time employment Vocation Requirements: [employer] Leisure: Hobbies-yes (Comment) Comments: bowling ADL ADL Lower Body Dressing: Performed;Modified independent Lower Body Dressing Details (indicate cue type and reason): able to don/doff bil. socks Where Assessed - Lower Body Dressing: Sitting, bed Toilet Transfer: Simulated;Supervision/safety Toilet Transfer Details (indicate cue type and reason): ambulated to bed returning from PT session Toilet Transfer Method: Ambulating Toilet Transfer Equipment: Other  (comment) (bed) Vision/Perception  Vision - History Baseline Vision: Wears glasses all the time Patient Visual Report: No  change from baseline Cognition Cognition Arousal/Alertness: Awake/alert Overall Cognitive Status: Appears within functional limits for tasks assessed Orientation Level: Oriented X4 Sensation/Coordination Sensation Light Touch: Impaired Detail Light Touch Impaired Details: Impaired LUE Additional Comments: Pt's Lt hand and wrist is very cold to the touch.  Pt reports that her L UE feels "like a rubberband" Coordination Gross Motor Movements are Fluid and Coordinated: Yes Fine Motor Movements are Fluid and Coordinated: No (LUE) Extremity Assessment RUE Assessment RUE Assessment: Within Functional Limits LUE Assessment LUE Assessment: Exceptions to [facility] (3+/5) Mobility  Bed Mobility Bed Mobility: Yes Supine to Sit: 6: Modified independent (Device/Increase time) Sitting - Scoot to Edge of Bed: 6: Modified independent (Device/Increase time) Transfers Sit to Stand: 5: Supervision;With upper extremity assist;From bed Sit to Stand Details (indicate cue type and reason): VC for hand placement for safety Stand to Sit: 5: Supervision;With upper extremity assist;To bed Stand to Sit Details: VC for hand placement Exercises   End of Session OT - End of Session Equipment Utilized During Treatment: Gait belt Activity Tolerance: Patient tolerated treatment well Patient left: in bed;with call bell in reach;with family/visitor present Nurse Communication: Mobility status for transfers;Mobility status for ambulation General Behavior During Session: [facility] for tasks performed Cognition: [facility] for tasks performed  5:59 PM  10/29/2011 Cipriano Mile OTR/L Pager [phone] Office [phone]

## 2011-10-29 NOTE — Progress Notes (Signed)
   CARE MANAGEMENT NOTE 10/29/2011  Patient:  Wanda [name], Wanda [name]   Account Number:  [id number]  Date Initiated:  10/29/2011  Documentation initiated by:  Onnie Boer  Subjective/Objective Assessment:   PT WAS ADMITTED WITH CVA S/S     Action/Plan:   PROGRESSION OF CARE AND DISCHARGE PLANNING   Anticipated DC Date:  10/30/2011   Anticipated DC Plan:  HOME/SELF CARE  In-house referral  Clinical Social Worker      DC Planning Services  CM consult      Choice offered to / List presented to:             Status of service:  In process, will continue to follow Medicare Important Message given?   (If response is "NO", the following Medicare IM given date fields will be blank) Date Medicare IM given:   Date Additional Medicare IM given:    Discharge Disposition:    Per UR Regulation:  Reviewed for med. necessity/level of care/duration of stay  If discussed at Long Length of Stay Meetings, dates discussed:    Comments:  10/29/11 Onnie Boer, RN, BSN 1125 UR COMPLETED

## 2011-10-29 NOTE — Discharge Instructions (Signed)
STROKE/TIA DISCHARGE INSTRUCTIONS SMOKING Cigarette smoking nearly doubles your risk of having a stroke & is the single most alterable risk factor  If you smoke or have smoked in the last 12 months, you are advised to quit smoking for your health.  Most of the excess cardiovascular risk related to smoking disappears within a year of stopping.  Ask you doctor about anti-smoking medications  [place] Quit Line: 1-800-QUIT NOW  Free Smoking Cessation Classes [phone]  CHOLESTEROL Know your levels; limit fat & cholesterol in your diet  Lipid Panel     Component Value Date/Time   CHOL 224* 12/26/2007 0924   TRIG 104 12/26/2007 0924   HDL 41.2 12/26/2007 0924   CHOLHDL 5.4 CALC 12/26/2007 0924   VLDL 21 12/26/2007 0924      Many patients benefit from treatment even if their cholesterol is at goal.  Goal: Total Cholesterol (CHOL) less than 160  Goal:  Triglycerides (TRIG) less than 150  Goal:  HDL greater than 40  Goal:  LDL (LDLCALC) less than 100   BLOOD PRESSURE American Stroke Association blood pressure target is less that 120/80 mm/Hg  Your discharge blood pressure is:  BP: 116/77 mmHg  Monitor your blood pressure  Limit your salt and alcohol intake  Many individuals will require more than one medication for high blood pressure  DIABETES (A1c is a blood sugar average for last 3 months) Goal HGBA1c is under 7% (HBGA1c is blood sugar average for last 3 months)  Diabetes: {STROKE DC DIABETES:22357}    Lab Results  Component Value Date   HGBA1C 5.6 10/28/2011     Your HGBA1c can be lowered with medications, healthy diet, and exercise.  Check your blood sugar as directed by your physician  Call your physician if you experience unexplained or low blood sugars.  PHYSICAL ACTIVITY/REHABILITATION Goal is 30 minutes at least 4 days per week    {STROKE DC ACTIVITY/REHAB:22359}  Activity decreases your risk of heart attack and stroke and makes your heart stronger.  It helps control your  weight and blood pressure; helps you relax and can improve your mood.  Participate in a regular exercise program.  Talk with your doctor about the best form of exercise for you (dancing, walking, swimming, cycling).  DIET/WEIGHT Goal is to maintain a healthy weight  Your discharge diet is: Cardiac *** liquids Your height is:    Your current weight is:   Your Body Mass Index (BMI) is:     Following the type of diet specifically designed for you will help prevent another stroke.  Your goal weight range is:  ***  Your goal Body Mass Index (BMI) is 19-24.  Healthy food habits can help reduce 3 risk factors for stroke:  High cholesterol, hypertension, and excess weight.  RESOURCES Stroke/Support Group:  Call [phone]  they meet the 3rd Sunday of the month on the Rehab Unit at [facility], [place] ( no meetings June, July & Aug).  STROKE EDUCATION PROVIDED/REVIEWED AND GIVEN TO PATIENT Stroke warning signs and symptoms How to activate emergency medical system (call 911). Medications prescribed at discharge. Need for follow-up after discharge. Personal risk factors for stroke. Pneumonia vaccine given:   {STROKE DC YES/NO/DATE:22363} Flu vaccine given:   {STROKE DC YES/NO/DATE:22363} My questions have been answered, the writing is legible, and I understand these instructions.  I will adhere to these goals & educational materials that have been provided to me after my discharge from the hospital.

## 2011-10-29 NOTE — Progress Notes (Signed)
PROGRESS NOTE  Wanda [name] ZOX:[id number] DOB: Sep 01, 1965 DOA: 10/28/2011 PCP: Cain Saupe, MD, MD  Brief narrative: 46 y/o aaf admit with possible TIA VS CVA VS atypical migraine  Past medical history: Depression, chronic headache/migraine, obesity, hypertension, hyperlipidemia, fatigue,  Consultants:  Neurology  Procedures:  CT head without contrast 4/3= no acute intracranial abnormalities   MRI/A head 4/3=negative  Carotid Duplex 4/4= bilateral 40-59% ICA stenosis  ECHO 4/4=LVEF 55-60%no signif valvular dysfunction  Antibiotics:  none   Subjective  Well.  Seems better.  No acute distress.  Tol Po. Mentions numerous life stressors including  Change/loss of job and becomes emotional re: this.  Numbness still + buit not s severe as prior.   Objective   Interim History: NO changes  Objective: Filed Vitals:   10/28/11 2033 10/29/11 0248 10/29/11 0706 10/29/11 1100  BP: 113/70 106/70 116/77   Pulse: 89 88 92   Temp: 98.3 F (36.8 C) 97.9 F (36.6 C) 97.9 F (36.6 C)   TempSrc: Oral Oral Oral   Resp: 18 20 20    Height:    5\' 6"  (1.676 m)  Weight:    93.441 kg (206 lb)  SpO2: 100% 94% 94%    No intake or output data in the 24 hours ending 10/29/11 1423  Exam:  General: alert, oriented.  Facial assym less pronounced Cardiovascular: s1 s2 , no m/r/g Respiratory: cta b.  No added  Abdomen: soft, nt,nd Skinno notable rash.  Tattoo RLE Neurostrength slighlty diminished to LUE, reflexes equivocal.  Smile symmetric.  Data Reviewed: Basic Metabolic Panel:  Lab 10/28/11 8119  NA 134*  K 3.3*  CL 98  CO2 24  GLUCOSE 120*  BUN 10  CREATININE 0.70  CALCIUM 9.5  MG --  PHOS --   Liver Function Tests:  Lab 10/28/11 1448  AST 22  ALT 20  ALKPHOS 131*  BILITOT 0.2*  PROT 7.8  ALBUMIN 4.3   No results found for this basename: LIPASE:5,AMYLASE:5 in the last 168 hours No results found for this basename: AMMONIA:5 in the last 168  hours CBC:  Lab 10/28/11 1448  WBC 9.6  NEUTROABS 5.2  HGB 13.1  HCT 38.6  MCV 85.4  PLT 274   Cardiac Enzymes:  Lab 10/28/11 1449  CKTOTAL 86  CKMB 1.4  CKMBINDEX --  TROPONINI <0.30   BNP: No components found with this basename: POCBNP:5 CBG:  Lab 10/29/11 0702 10/28/11 2218 10/28/11 1729  GLUCAP 101* 125* 81    No results found for this or any previous visit (from the past 240 hour(s)).   Studies:              All Imaging reviewed and is as per above notation   Scheduled Meds:   .  stroke: mapping our early stages of recovery book   Does not apply Once  . aspirin      . aspirin  324 mg Oral Daily  . fluticasone  1 spray Each Nare Daily  .  morphine injection  1 mg Intravenous Once  . vitamin B-12  100 mcg Oral Daily  . vitamin C  1,000 mg Oral Daily  . DISCONTD: aspirin  325 mg Oral Daily   Continuous Infusions:    Assessment/Plan: 1. Likely TIA involving R side brain/Lacunar infarct-appreciate neuro input.  WOuld continue at least ASA for secondary prevention of CVA.  Will be reviewed with full date of ECHO and MRI.  Hypercoag panel partially back but still pending.  Suspect  will resolve completely and could go [id number].  Will have pt/ot clear patient for d/c 2. Hld-LDL 122.  Start lipitor 40 mg daily.  Lipid panel 3 mo/dietary modif neede as outpt 3. htn-well controlled-add back losartan/hctz combo. Hold if bp below 130/90 4. Chronic Headache-could be migraine variant.  Would defer to neuro, could potentially add midrin for this and reassess in am  Code Status: full  Family Communication: son and daughter in room and understand Disposition Plan: likely home tomorrow   Pleas Koch, MD  [facility] Pager [phone] 10/29/2011, 2:23 PM    LOS: 1 day

## 2011-10-29 NOTE — Progress Notes (Deleted)
Carotid dopplers performed.

## 2011-10-30 ENCOUNTER — Telehealth: Payer: Self-pay | Admitting: Family Medicine

## 2011-10-30 ENCOUNTER — Encounter: Payer: Self-pay | Admitting: Family Medicine

## 2011-10-30 LAB — PROTEIN C, TOTAL: Protein C, Total: 92 % (ref 72–160)

## 2011-10-30 MED ORDER — HYDROCHLOROTHIAZIDE 12.5 MG PO CAPS: 12.5000 mg | ORAL_CAPSULE | Freq: Every day | ORAL | Status: DC

## 2011-10-30 MED ORDER — ISOMETHEPTENE-APAP-DICHLORAL 65-325-100 MG PO CAPS: 1.0000 | ORAL_CAPSULE | ORAL | Status: DC | PRN

## 2011-10-30 MED ORDER — MOMETASONE FUROATE 50 MCG/ACT NA SUSP: 2.0000 | Freq: Every day | NASAL | Status: DC

## 2011-10-30 MED ORDER — ASPIRIN 325 MG PO TABS: 324.0000 mg | ORAL_TABLET | Freq: Every day | ORAL | Status: DC

## 2011-10-30 NOTE — Discharge Summary (Signed)
Date of Admission: 10/28/2011  2:46 PM Admitter: @ADMITPROV @   Date of Discharge4/11/2011 Attending Physician: Rhetta Mura, MD  Things to Follow-up on: Needs a lipid panel in 3 mo Needs outpatient Nutrition/Dietician referral Would reassess blood pressure control  Follow-up remainder of hypercoagulable panel   [facility] Discharge Summary  Wanda [name] ZOX:[id number] DOB: 1965/08/13 DOA: 10/28/2011 PCP: Cain Saupe, MD, MD  Brief narrative: 46 y/o aaf admit with possible TIA VS CVA VS atypical migraine  Past medical history: Depression, chronic headache/migraine, obesity, hypertension, hyperlipidemia, fatigue,  Consultants:  Neurology  Procedures:  CT head without contrast 4/3= no acute intracranial abnormalities     MRI/A head 4/3=negative   Carotid Duplex 4/4= bilateral 40-59% ICA stenosis   ECHO 4/4=LVEF 55-60%no signif valvular dysfunction  Antibiotics:  none  Hospital Course by problem list: Assessment/Plan: 1. Likely TIA involving R side brain/Lacunar infarct-appreciate neuro inpu who saw her in the hospital acutelyt.  Would continue at least ASA for secondary prevention of CVA.  Echo and Carotid artery study non-suggestive of acute causes.  Hypercoag panel partially back-protein S activity was 154 protein C activity was 165 antithrombin III was 160, Lupus anticoagulant was negative beta-2 glycoprotein was negative homocysteine was 9.2, anticardiolipin IgG was 13, IgM was 11 IgA was 9 factor V Leyden is pending and some other components are still pending.her stroke is not involved in fact got better over her short two-day hospital stay. She had physical therapy work with her as well as occupational therapy who did not have any specific recommendations and needs for modalities and patient likely can discharge home.  I've instructed her to continue all of her activities after Wednesday of next week and we'll give her a work note 2. Hld-LDL 122.   Start lipitor 40 mg daily.  Lipid panel 3 mo/dietary modif neede as outpt 3. htn-well controlled-add back losartan/hctz combo. Hold if bp below 130/90. 4. Chronic Headache-could be migraine variant.  Would defer to neuro, midrin helped for this and  She should continue to take the medicine for a short period of time-can follow up with neurology   Procedures Performed and pertinent labs: Ct Head Wo Contrast  10/28/2011  *RADIOLOGY REPORT*  Clinical Data: Code stroke  CT HEAD WITHOUT CONTRAST  Technique:  Contiguous axial images were obtained from the base of the skull through the vertex without contrast.  Comparison: None  Findings: The brain has a normal appearance without evidence for hemorrhage, infarction, hydrocephalus, or mass lesion.  There is no extra axial fluid collection.  The skull and paranasal sinuses are normal.  IMPRESSION: No acute intracranial abnormalities.  No evidence for stroke.  Original Report Authenticated By: Rosealee Albee, M.D.   Mr [facility] Wo Contrast  10/28/2011  *RADIOLOGY REPORT*  Clinical Data:  Code stroke.  Left-sided weakness.  MRI HEAD WITHOUT CONTRAST MRA HEAD WITHOUT CONTRAST  Technique:  Multiplanar, multiecho pulse sequences of the brain and surrounding structures were obtained without intravenous contrast. Angiographic images of the head were obtained using MRA technique without contrast.  Comparison:  CT head earlier in the day.  MRI HEAD  Findings:  There is no evidence for acute infarction, intracranial hemorrhage, mass lesion, hydrocephalus, or extra-axial fluid. There is no atrophy or white matter disease.  No areas of chronic hemorrhage.  Slight tonsillar ectopia.  Normal pituitary. Unremarkable visualized upper cervical region. No change from prior normal CT.  IMPRESSION: Negative.  MRA HEAD  Findings: Widely patent carotid and basilar arteries.  The vertebral arteries are codominant.  There is no intracranial stenosis or aneurysm.  IMPRESSION: Negative.   Original Report Authenticated By: Elsie Stain, M.D.   Mr Brain Wo Contrast  10/28/2011  *RADIOLOGY REPORT*  Clinical Data:  Code stroke.  Left-sided weakness.  MRI HEAD WITHOUT CONTRAST MRA HEAD WITHOUT CONTRAST  Technique:  Multiplanar, multiecho pulse sequences of the brain and surrounding structures were obtained without intravenous contrast. Angiographic images of the head were obtained using MRA technique without contrast.  Comparison:  CT head earlier in the day.  MRI HEAD  Findings:  There is no evidence for acute infarction, intracranial hemorrhage, mass lesion, hydrocephalus, or extra-axial fluid. There is no atrophy or white matter disease.  No areas of chronic hemorrhage.  Slight tonsillar ectopia.  Normal pituitary. Unremarkable visualized upper cervical region. No change from prior normal CT.  IMPRESSION: Negative.  MRA HEAD  Findings: Widely patent carotid and basilar arteries.  The vertebral arteries are codominant.  There is no intracranial stenosis or aneurysm.  IMPRESSION: Negative.  Original Report Authenticated By: Elsie Stain, M.D.   Discharge Vitals & PE:  BP 115/74  Pulse 96  Temp(Src) 97.5 F (36.4 C) (Oral)  Resp 20  Ht 5\' 6"  (1.676 m)  Wt 93.441 kg (206 lb)  BMI 33.25 kg/m2  SpO2 98% General: alert, oriented.  Facial assym less pronounced Cardiovascular: s1 s2 , no m/r/g Respiratory: cta b.  No added   Abdomen: soft, nt,nd Skinno notable rash.  Tattoo RLE Neurostrength slighlty diminished to LUE, reflexes equivocal.  Smile symmetric.  Discharge Labs:  Results for orders placed during the hospital encounter of 10/28/11 (from the past 24 hour(s))  GLUCOSE, CAPILLARY     Status: Normal   Collection Time   10/29/11 11:13 AM      Component Value Range   Glucose-Capillary 78  70 - 99 (mg/dL)   Comment 1 Documented in Chart     Comment 2 Notify RN      Disposition and follow-up:   Wanda [name] was discharged from in good condition.    Follow-up  Appointments:  Follow-up Information    Follow up with FULP, CAMMIE, MD in 5 days.      Follow up with Gates Rigg, MD. Call in 2 months.   Contact information:   [address] [facility] [place] [address] [phone]          Discharge Medications: Medication List  As of 10/30/2011  9:40 AM   STOP taking these medications         amoxicillin 500 MG capsule      dextromethorphan-guaiFENesin 30-600 MG per 12 hr tablet      HYDROcodone-homatropine 5-1.5 MG/5ML syrup         TAKE these medications         acetaminophen 500 MG tablet   Commonly known as: TYLENOL   Take 1,000 mg by mouth every 6 (six) hours as needed. Headache pain or fever      aspirin 325 MG tablet   Take 1 tablet (324 mg total) by mouth daily.      cyclobenzaprine 10 MG tablet   Commonly known as: FLEXERIL   Take 10 mg by mouth 3 (three) times daily as needed. For muscle spasms      hydrochlorothiazide 12.5 MG capsule   Commonly known as: MICROZIDE   Take 1 capsule (12.5 mg total) by mouth daily.      isometheptene-acetaminophen-dichloralphenazone  65-325-100 MG capsule   Commonly known as: MIDRIN   Take 1 capsule by mouth every 4 (four) hours as needed.      lisinopril-hydrochlorothiazide 10-12.5 MG per tablet   Commonly known as: PRINZIDE,ZESTORETIC   Take 1 tablet by mouth daily.      mometasone 50 MCG/ACT nasal spray   Commonly known as: NASONEX   Place 2 sprays into the nose daily.      traMADol 50 MG tablet   Commonly known as: ULTRAM   Take 50 mg by mouth every 6 (six) hours as needed. For pain      vitamin B-12 100 MCG tablet   Commonly known as: CYANOCOBALAMIN   Take 100 mcg by mouth daily.      vitamin C 500 MG tablet   Commonly known as: ASCORBIC ACID   Take 1,000 mg by mouth daily.           Medications Discontinued During This Encounter  Medication Reason  . amoxicillin (AMOXIL) 500 MG capsule   .  dextromethorphan-guaiFENesin (MUCINEX DM) 30-600 MG per 12 hr tablet   . HYDROcodone-homatropine (HYCODAN) 5-1.5 MG/5ML syrup   . mometasone (NASONEX) 50 MCG/ACT nasal spray   . traMADol (ULTRAM) 50 MG tablet   . aspirin tablet 325 mg   . fluticasone (FLONASE) 50 MCG/ACT nasal spray 1 spray   . lisinopril-hydrochlorothiazide (PRINZIDE,ZESTORETIC) 10-12.5 MG per tablet 1 tablet Formulary change      > 40 minutes time spent preparing d/c summary, including direct face-face patient Time, contact with consultants, family and care coordination   Signed: Prezley Qadir,JAI 10/30/2011, 9:40 AM

## 2011-10-30 NOTE — Progress Notes (Signed)
Physical Therapy Treatment Patient Details Name: Wanda [name] MRN: [id number] DOB: 1966-02-08 Today's Date: 10/30/2011  PT Assessment/Plan  PT - Assessment/Plan Comments on Treatment Session: Pt is near baseline functional level, although still recommending supervision upon d/c for safety.  PT Plan: Discharge plan remains appropriate;Frequency remains appropriate PT Frequency: Min 4X/week Follow Up Recommendations: No PT follow up;Supervision - Intermittent Equipment Recommended: None recommended by OT;None recommended by PT PT Goals  Acute Rehab PT Goals PT Goal Formulation: With patient PT Goal: Sit to Stand - Progress: Progressing toward goal PT Goal: Stand to Sit - Progress: Progressing toward goal PT Transfer Goal: Bed to Chair/Chair to Bed - Progress: Progressing toward goal PT Goal: Ambulate - Progress: Progressing toward goal PT Goal: Up/Down Stairs - Progress: Progressing toward goal  PT Treatment Precautions/Restrictions  Precautions Precautions: Fall Restrictions Weight Bearing Restrictions: No Mobility (including Balance) Bed Mobility Bed Mobility: No Supine to Sit: 6: Modified independent (Device/Increase time) Sitting - Scoot to Edge of Bed: 6: Modified independent (Device/Increase time) Transfers Transfers: Yes Sit to Stand: 6: Modified independent (Device/Increase time) Stand to Sit: 6: Modified independent (Device/Increase time) Ambulation/Gait Ambulation/Gait: Yes Ambulation/Gait Assistance: 5: Supervision Ambulation/Gait Assistance Details (indicate cue type and reason): Supervision for safety as pt still with slight limp during ambulation, pt attributes to R pain(prior to stroke). Ambulation Distance (Feet): 200 Feet Assistive device: None Gait Pattern: Step-to pattern;Decreased step length - right;Decreased stance time - left;Decreased hip/knee flexion - left;Decreased trunk rotation Gait velocity: decreased gait speed Stairs: Yes Stairs  Assistance: 5: Supervision Stair Management Technique: One rail Left;Step to pattern;Forwards Number of Stairs: 5     Exercise  Other Exercises Other Exercises: Assisted pt and educated on safety during tub transfer. Other Exercises: Provided pt with fine motor exercises handout.  Pt able to independently demonstrate circled exercises using LUE. End of Session PT - End of Session Equipment Utilized During Treatment: Gait belt Activity Tolerance: Patient tolerated treatment well Patient left: in chair;with call bell in reach Nurse Communication: Mobility status for transfers;Mobility status for ambulation General Behavior During Session: [facility] for tasks performed Cognition: [facility] for tasks performed  Milana Kidney 10/30/2011, 12:55 PM  10/30/2011 Milana Kidney DPT PAGER: [phone] OFFICE: [phone]

## 2011-10-30 NOTE — Progress Notes (Deleted)
Rhetta Mura, MD Physician Signed Internal Medicine D/C Summaries 10/30/2011 9:40 AM  Date of Admission: 10/28/2011  2:46 PM Admitter: @ADMITPROV @            Date of Discharge4/11/2011 Attending Physician: Rhetta Mura, MD   Things to Follow-up on: Needs a lipid panel in 3 mo  Needs outpatient Nutrition/Dietician referral Would reassess blood pressure control   Follow-up remainder of hypercoagulable panel      [facility] Discharge Summary   Wanda [name] HQI:[id number] DOB: Dec 10, 1965 DOA: 10/28/2011 PCP: Cain Saupe, MD, MD   Brief narrative: 46 y/o aaf admit with possible TIA VS CVA VS atypical migraine   Past medical history: Depression, chronic headache/migraine, obesity, hypertension, hyperlipidemia, fatigue,   Consultants: Neurology   Procedures: CT head without contrast 4/3= no acute intracranial abnormalities    MRI/A head 4/3=negative  Carotid Duplex 4/4= bilateral 40-59% ICA stenosis  ECHO 4/4=LVEF 55-60%no signif valvular dysfunction   Antibiotics: none   Hospital Course by problem list: Assessment/Plan: Likely TIA involving R side brain/Lacunar infarct-appreciate neuro inpu who saw her in the hospital acutelyt.  Would continue at least ASA for secondary prevention of CVA.  Echo and Carotid artery study non-suggestive of acute causes.  Hypercoag panel partially back-protein S activity was 154 protein C activity was 165 antithrombin III was 160, Lupus anticoagulant was negative beta-2 glycoprotein was negative homocysteine was 9.2, anticardiolipin IgG was 13, IgM was 11 IgA was 9 factor V Leyden is pending and some other components are still pending.her stroke is not involved in fact got better over her short two-day hospital stay. She had physical therapy work with her as well as occupational therapy who did not have any specific recommendations and needs for modalities and patient likely can discharge home.  I've instructed her  to continue all of her activities after Wednesday of next week and we'll give her a work note Hld-LDL 122.  Start lipitor 40 mg daily.  Lipid panel 3 mo/dietary modif neede as outpt htn-well controlled-add back losartan/hctz combo. Hold if bp below 130/90. Chronic Headache-could be migraine variant.  Would defer to neuro, midrin helped for this and  She should continue to take the medicine for a short period of time-can follow up with neurology     Procedures Performed and pertinent labs: Ct Head Wo Contrast   10/28/2011  *RADIOLOGY REPORT*  Clinical Data: Code stroke  CT HEAD WITHOUT CONTRAST  Technique:  Contiguous axial images were obtained from the base of the skull through the vertex without contrast.  Comparison: None  Findings: The brain has a normal appearance without evidence for hemorrhage, infarction, hydrocephalus, or mass lesion.  There is no extra axial fluid collection.  The skull and paranasal sinuses are normal.  IMPRESSION: No acute intracranial abnormalities.  No evidence for stroke.  Original Report Authenticated By: Rosealee Albee, M.D.    Mr [facility] Wo Contrast   10/28/2011  *RADIOLOGY REPORT*  Clinical Data:  Code stroke.  Left-sided weakness.  MRI HEAD WITHOUT CONTRAST MRA HEAD WITHOUT CONTRAST  Technique:  Multiplanar, multiecho pulse sequences of the brain and surrounding structures were obtained without intravenous contrast. Angiographic images of the head were obtained using MRA technique without contrast.  Comparison:  CT head earlier in the day.  MRI HEAD  Findings:  There is no evidence for acute infarction, intracranial hemorrhage, mass lesion, hydrocephalus, or extra-axial fluid. There is no atrophy or white matter disease.  No areas of chronic hemorrhage.  Slight tonsillar  ectopia.  Normal pituitary. Unremarkable visualized upper cervical region. No change from prior normal CT.  IMPRESSION: Negative.  MRA HEAD  Findings: Widely patent carotid and basilar arteries.   The vertebral arteries are codominant.  There is no intracranial stenosis or aneurysm.  IMPRESSION: Negative.  Original Report Authenticated By: Elsie Stain, M.D.    Mr Brain Wo Contrast   10/28/2011  *RADIOLOGY REPORT*  Clinical Data:  Code stroke.  Left-sided weakness.  MRI HEAD WITHOUT CONTRAST MRA HEAD WITHOUT CONTRAST  Technique:  Multiplanar, multiecho pulse sequences of the brain and surrounding structures were obtained without intravenous contrast. Angiographic images of the head were obtained using MRA technique without contrast.  Comparison:  CT head earlier in the day.  MRI HEAD  Findings:  There is no evidence for acute infarction, intracranial hemorrhage, mass lesion, hydrocephalus, or extra-axial fluid. There is no atrophy or white matter disease.  No areas of chronic hemorrhage.  Slight tonsillar ectopia.  Normal pituitary. Unremarkable visualized upper cervical region. No change from prior normal CT.  IMPRESSION: Negative.  MRA HEAD  Findings: Widely patent carotid and basilar arteries.  The vertebral arteries are codominant.  There is no intracranial stenosis or aneurysm.  IMPRESSION: Negative.  Original Report Authenticated By: Elsie Stain, M.D.     Discharge Vitals & PE:  BP 115/74  Pulse 96  Temp(Src) 97.5 F (36.4 C) (Oral)  Resp 20  Ht 5\' 6"  (1.676 m)  Wt 93.441 kg (206 lb)  BMI 33.25 kg/m2  SpO2 98% General: alert, oriented.  Facial assym less pronounced Cardiovascular: s1 s2 , no m/r/g Respiratory: cta b.  No added   Abdomen: soft, nt,nd Skinno notable rash.  Tattoo RLE Neurostrength slighlty diminished to LUE, reflexes equivocal.  Smile symmetric.   Discharge Labs:  Results for orders placed during the hospital encounter of 10/28/11 (from the past 24 hour(s))   GLUCOSE, CAPILLARY     Status: Normal     Collection Time     10/29/11 11:13 AM       Component  Value  Range     Glucose-Capillary  78   70 - 99 (mg/dL)     Comment 1  Documented in Chart         Comment 2  Notify RN         Disposition and follow-up:    Ms.Wanda [name] was discharged from in good condition.     Follow-up Appointments:    Follow-up Information      Follow up with FULP, CAMMIE, MD in 5 days.          Follow up with Gates Rigg, MD. Call in 2 months.     Contact information:     [address]  [facility] [place] [address] [phone]               Discharge Medications: Medication List   As of 10/30/2011  9:40 AM     STOP taking these medications              amoxicillin 500 MG capsule           dextromethorphan-guaiFENesin 30-600 MG per 12 hr tablet           HYDROcodone-homatropine 5-1.5 MG/5ML syrup                TAKE these medications              acetaminophen 500 MG  tablet     Commonly known as: TYLENOL     Take 1,000 mg by mouth every 6 (six) hours as needed. Headache pain or fever           aspirin 325 MG tablet     Take 1 tablet (324 mg total) by mouth daily.           cyclobenzaprine 10 MG tablet     Commonly known as: FLEXERIL     Take 10 mg by mouth 3 (three) times daily as needed. For muscle spasms           hydrochlorothiazide 12.5 MG capsule     Commonly known as: MICROZIDE     Take 1 capsule (12.5 mg total) by mouth daily.           isometheptene-acetaminophen-dichloralphenazone 65-325-100 MG capsule     Commonly known as: MIDRIN     Take 1 capsule by mouth every 4 (four) hours as needed.           lisinopril-hydrochlorothiazide 10-12.5 MG per tablet     Commonly known as: PRINZIDE,ZESTORETIC     Take 1 tablet by mouth daily.           mometasone 50 MCG/ACT nasal spray     Commonly known as: NASONEX     Place 2 sprays into the nose daily.           traMADol 50 MG tablet     Commonly known as: ULTRAM     Take 50 mg by mouth every 6 (six) hours as needed. For pain           vitamin B-12 100 MCG tablet     Commonly known as: CYANOCOBALAMIN      Take 100 mcg by mouth daily.           vitamin C 500 MG tablet     Commonly known as: ASCORBIC ACID     Take 1,000 mg by mouth daily.                  Medications Discontinued During This Encounter   Medication  Reason   .  amoxicillin (AMOXIL) 500 MG capsule     .  dextromethorphan-guaiFENesin (MUCINEX DM) 30-600 MG per 12 hr tablet     .  HYDROcodone-homatropine (HYCODAN) 5-1.5 MG/5ML syrup     .  mometasone (NASONEX) 50 MCG/ACT nasal spray     .  traMADol (ULTRAM) 50 MG tablet     .  aspirin tablet 325 mg     .  fluticasone (FLONASE) 50 MCG/ACT nasal spray 1 spray     .  lisinopril-hydrochlorothiazide (PRINZIDE,ZESTORETIC) 10-12.5 MG per tablet 1 tablet  Formulary change          > 40 minutes time spent preparing d/c summary, including direct face-face patient Time, contact with consultants, family and care coordination   Signed: SAMTANI,JAI 10/30/2011, 9:40 AM          Routing History...     Date/Time From To Method   10/30/2011 10:04 AM Rhetta Mura, MD Micki Riley, MD Fax   10/30/2011 10:04 AM Rhetta Mura, MD Cain Saupe, MD Fax

## 2011-10-30 NOTE — Progress Notes (Signed)
Occupational Therapy Treatment/ Discharge Note Patient Details Name: Wanda [name] MRN: [id number] DOB: 1965/12/29 Today's Date: 10/30/2011  OT Assessment/Plan OT Assessment/Plan Comments on Treatment Session: Educated pt on LUE HEP consisting of theraputty and fine motor exercises.  Pt able to independently demonstrate HEP.  Educated pt on various ways to incorporate Lt. hand during ADL activity, and pt reports using Lt hand more frequently during feeding tasks.  All education complete.  Will sign off.  OT Plan: All goals met and education completed, patient discharged from OT services Follow Up Recommendations: Supervision - Intermittent Equipment Recommended: None recommended by OT OT Goals Arm Goals Pt Will Complete Theraputty Exer: Independently;Left upper extremity;Min resistance putty;to increase strength Arm Goal: Theraputty Exercises - Progress: Met Additional Arm Goal #1: Pt will independently perform 2-3 fine motor activities on handout daily to increase LUE fine motor coordination. Arm Goal: Additional Goal #1 - Progress: Met Miscellaneous OT Goals Miscellaneous OT Goal #1: Pt will independently incorporate LUE during ADL activity 75% of time. OT Goal: Miscellaneous Goal #1 - Progress: Met  OT Treatment Precautions/Restrictions  Precautions Precautions: Fall Restrictions Weight Bearing Restrictions: No   ADL ADL Eating/Feeding: Performed;Modified independent Eating/Feeding Details (indicate cue type and reason): Pt used Lt hand to reach for cup and to hold cup during drinking. Where Assessed - Eating/Feeding: Edge of bed ADL Comments: Pt reports that she has been using Lt. hand for tasks such as opening containers and for feeding. Mobility  Bed Mobility Bed Mobility: Yes Supine to Sit: 6: Modified independent (Device/Increase time) Sitting - Scoot to Edge of Bed: 6: Modified independent (Device/Increase time) Exercises Other Exercises Other Exercises:  Provided theraputty and exercise handout to pt.  Pt able to independently follow exercises on handout. Other Exercises: Provided pt with fine motor exercises handout.  Pt able to independently demonstrate circled exercises using LUE.  End of Session OT - End of Session Equipment Utilized During Treatment: Other (comment) (none) Activity Tolerance: Patient tolerated treatment well Patient left: in bed;with call bell in reach Nurse Communication: Other (comment) (provided pt with HEP) General Behavior During Session: [facility] for tasks performed Cognition: [facility] for tasks performed   9:57 AM 10/30/2011 Cipriano Mile OTR/L Pager [phone] Office [phone]

## 2011-12-03 ENCOUNTER — Other Ambulatory Visit: Payer: Self-pay | Admitting: Family Medicine

## 2011-12-03 DIAGNOSIS — Z1231 Encounter for screening mammogram for malignant neoplasm of breast: Secondary | ICD-10-CM

## 2011-12-31 ENCOUNTER — Ambulatory Visit: Payer: 59

## 2012-01-26 ENCOUNTER — Ambulatory Visit
Admission: RE | Admit: 2012-01-26 | Discharge: 2012-01-26 | Disposition: A | Discharge status: Home / Self Care | Payer: 59 | Source: Ambulatory Visit | Attending: Family Medicine | Admitting: Family Medicine

## 2012-01-26 DIAGNOSIS — Z1231 Encounter for screening mammogram for malignant neoplasm of breast: Secondary | ICD-10-CM

## 2012-03-25 ENCOUNTER — Other Ambulatory Visit (HOSPITAL_COMMUNITY)
Admission: RE | Admit: 2012-03-25 | Discharge: 2012-03-25 | Disposition: A | Discharge status: Home / Self Care | Payer: 59 | Source: Ambulatory Visit | Attending: Family Medicine | Admitting: Family Medicine

## 2012-03-25 ENCOUNTER — Other Ambulatory Visit: Payer: Self-pay | Admitting: Family Medicine

## 2012-03-25 DIAGNOSIS — Z01419 Encounter for gynecological examination (general) (routine) without abnormal findings: Secondary | ICD-10-CM | POA: Insufficient documentation

## 2012-06-02 ENCOUNTER — Encounter (HOSPITAL_COMMUNITY): Payer: Self-pay | Admitting: Nurse Practitioner

## 2012-06-02 ENCOUNTER — Emergency Department (HOSPITAL_COMMUNITY): Payer: 59

## 2012-06-02 ENCOUNTER — Observation Stay (HOSPITAL_COMMUNITY)
Admission: EM | Admit: 2012-06-02 | Discharge: 2012-06-02 | Disposition: A | Discharge status: Home / Self Care | Payer: 59 | Attending: Emergency Medicine | Admitting: Emergency Medicine

## 2012-06-02 DIAGNOSIS — G8929 Other chronic pain: Secondary | ICD-10-CM | POA: Insufficient documentation

## 2012-06-02 DIAGNOSIS — Z862 Personal history of diseases of the blood and blood-forming organs and certain disorders involving the immune mechanism: Secondary | ICD-10-CM | POA: Insufficient documentation

## 2012-06-02 DIAGNOSIS — Z8719 Personal history of other diseases of the digestive system: Secondary | ICD-10-CM | POA: Insufficient documentation

## 2012-06-02 DIAGNOSIS — Z79899 Other long term (current) drug therapy: Secondary | ICD-10-CM | POA: Insufficient documentation

## 2012-06-02 DIAGNOSIS — E7800 Pure hypercholesterolemia, unspecified: Secondary | ICD-10-CM | POA: Insufficient documentation

## 2012-06-02 DIAGNOSIS — Z7982 Long term (current) use of aspirin: Secondary | ICD-10-CM | POA: Insufficient documentation

## 2012-06-02 DIAGNOSIS — R209 Unspecified disturbances of skin sensation: Principal | ICD-10-CM

## 2012-06-02 DIAGNOSIS — Z8639 Personal history of other endocrine, nutritional and metabolic disease: Secondary | ICD-10-CM | POA: Insufficient documentation

## 2012-06-02 DIAGNOSIS — Z87891 Personal history of nicotine dependence: Secondary | ICD-10-CM | POA: Insufficient documentation

## 2012-06-02 DIAGNOSIS — M6281 Muscle weakness (generalized): Secondary | ICD-10-CM | POA: Insufficient documentation

## 2012-06-02 DIAGNOSIS — F3289 Other specified depressive episodes: Secondary | ICD-10-CM | POA: Insufficient documentation

## 2012-06-02 DIAGNOSIS — R202 Paresthesia of skin: Secondary | ICD-10-CM

## 2012-06-02 DIAGNOSIS — F329 Major depressive disorder, single episode, unspecified: Secondary | ICD-10-CM | POA: Insufficient documentation

## 2012-06-02 DIAGNOSIS — E669 Obesity, unspecified: Secondary | ICD-10-CM | POA: Insufficient documentation

## 2012-06-02 DIAGNOSIS — R51 Headache: Secondary | ICD-10-CM | POA: Insufficient documentation

## 2012-06-02 DIAGNOSIS — G43109 Migraine with aura, not intractable, without status migrainosus: Secondary | ICD-10-CM

## 2012-06-02 DIAGNOSIS — Z8673 Personal history of transient ischemic attack (TIA), and cerebral infarction without residual deficits: Secondary | ICD-10-CM | POA: Insufficient documentation

## 2012-06-02 DIAGNOSIS — I10 Essential (primary) hypertension: Secondary | ICD-10-CM | POA: Insufficient documentation

## 2012-06-02 DIAGNOSIS — R29898 Other symptoms and signs involving the musculoskeletal system: Secondary | ICD-10-CM

## 2012-06-02 LAB — POCT I-STAT, CHEM 8
BUN: 12 mg/dL (ref 6–23)
Creatinine, Ser: 1 mg/dL (ref 0.50–1.10)
Glucose, Bld: 89 mg/dL (ref 70–99)
Potassium: 3.1 mEq/L — ABNORMAL LOW (ref 3.5–5.1)
Sodium: 143 mEq/L (ref 135–145)

## 2012-06-02 LAB — POCT I-STAT TROPONIN I

## 2012-06-02 LAB — CBC WITH DIFFERENTIAL/PLATELET
Basophils Relative: 0 % (ref 0–1)
Eosinophils Absolute: 0 10*3/uL (ref 0.0–0.7)
HCT: 37.4 % (ref 36.0–46.0)
Hemoglobin: 12.8 g/dL (ref 12.0–15.0)
Lymphs Abs: 2.3 10*3/uL (ref 0.7–4.0)
MCH: 30.5 pg (ref 26.0–34.0)
MCHC: 34.2 g/dL (ref 30.0–36.0)
Monocytes Absolute: 0.6 10*3/uL (ref 0.1–1.0)
Monocytes Relative: 7 % (ref 3–12)
Neutrophils Relative %: 64 % (ref 43–77)
RBC: 4.2 MIL/uL (ref 3.87–5.11)

## 2012-06-02 LAB — BASIC METABOLIC PANEL
BUN: 12 mg/dL (ref 6–23)
Chloride: 104 mEq/L (ref 96–112)
Creatinine, Ser: 0.92 mg/dL (ref 0.50–1.10)
GFR calc Af Amer: 84 mL/min — ABNORMAL LOW (ref >90)
GFR calc non Af Amer: 72 mL/min — ABNORMAL LOW (ref >90)
Glucose, Bld: 90 mg/dL (ref 70–99)
Potassium: 3 mEq/L — ABNORMAL LOW (ref 3.5–5.1)

## 2012-06-02 MED ORDER — METOCLOPRAMIDE HCL 5 MG/ML IJ SOLN
10.0000 mg | Freq: Once | INTRAMUSCULAR | Status: AC
  Administered 2012-06-02: 10 mg via INTRAVENOUS
  Filled 2012-06-02: qty 2

## 2012-06-02 MED ORDER — DIPHENHYDRAMINE HCL 50 MG/ML IJ SOLN
25.0000 mg | Freq: Once | INTRAMUSCULAR | Status: AC
  Administered 2012-06-02: 25 mg via INTRAVENOUS
  Filled 2012-06-02: qty 1

## 2012-06-02 MED ORDER — SODIUM CHLORIDE 0.9 % IV BOLUS (SEPSIS)
1000.0000 mL | Freq: Once | INTRAVENOUS | Status: AC
  Administered 2012-06-02: 1000 mL via INTRAVENOUS

## 2012-06-02 MED ORDER — VALPROATE SODIUM 500 MG/5ML IV SOLN
500.0000 mg | Freq: Once | INTRAVENOUS | Status: DC
  Filled 2012-06-02: qty 5

## 2012-06-02 MED ORDER — POTASSIUM CHLORIDE CRYS ER 20 MEQ PO TBCR
40.0000 meq | EXTENDED_RELEASE_TABLET | Freq: Once | ORAL | Status: AC
  Administered 2012-06-02: 40 meq via ORAL
  Filled 2012-06-02: qty 2

## 2012-06-02 NOTE — Consult Note (Signed)
NEURO HOSPITALIST CONSULT NOTE    Reason for Consult:  Code Stroke LSN 2 days ago NIHSS 1  HPI:                                                                                                                                          Wanda [name] is an 46 y.o. female who sufers from chronic daily HA usually 5/10.  Patient was seen in April 2013 for acute onset left arm and leg weakness along with slurred speech.  MRI/MRA head at that time was negative, Echo was negative with 55-60% ef, carotid dopplers were negative, EKG showed NSR. Patietn since has followed up with Dr. Pearlean Brownie for chronic HA.  She has been placed on both Depakote, Topamax, Zanaflex, and Ultram. Initially she had some tingling sensation throughout her face with initiation of Topamax and Depakote but since has stopped. Patient returns to ED with HA rated 15/10, right face and arm decreased sensation.  Code stroke was called initially but canceled due to low NIHSS and resolution of symptoms.  Patient describes headache as sharp and holocranial.  Associated with nausea.  Rates headache at a 15/10.  Headache usually at a 5/10.  Past Medical History  Diagnosis Date  . Depression   . Fatigue   . Chronic headache   . TMJ pain dysfunction syndrome   . Obesity   . Sinusitis   . Hypertension   . Hypercholesterolemia   . Transient ischemic attack (TIA)     Past Surgical History  Procedure Date  . Back surgery     x2  . Torn disk l4 l5 s1     surgery by [facility] 9/11 +10/11  . Nasal sinus surgery   . Vaginal hysterectomy     for DUB in 10/2003  . Cholecystectomy     11/19/09    Family History  Problem Relation Age of Onset  . Cervical cancer Maternal Grandmother   . Cancer Maternal Grandmother     mouth  . Hypertension Brother   . Heart failure Brother      Social History:  reports that she quit smoking about 8 years ago. Her smoking use included Cigarettes. She quit after 20 years of use.  She does not have any smokeless tobacco history on file. She reports that she does not drink alcohol or use illicit drugs.  Allergies  Allergen Reactions  . Oxycodone-Acetaminophen Itching    Patient states she is allergic to the inactive ingredient  . Oxycodone-Acetaminophen Hives    Patient states she is allergic to the inactive ingredients  . Tylox (Oxycodone-Acetaminophen) Hives and Rash    MEDICATIONS:  Current Facility-Administered Medications  Medication Dose Route Frequency Provider Last Rate Last Dose  . [COMPLETED] diphenhydrAMINE (BENADRYL) injection 25 mg  25 mg Intravenous Once Warrick Parisian, MD   25 mg at 06/02/12 1153  . [COMPLETED] metoCLOPramide (REGLAN) injection 10 mg  10 mg Intravenous Once Warrick Parisian, MD   10 mg at 06/02/12 1153  . potassium chloride SA (K-DUR,KLOR-CON) CR tablet 40 mEq  40 mEq Oral Once Richardean Canal, MD      . [address] sodium chloride 0.9 % bolus 1,000 mL  1,000 mL Intravenous Once Warrick Parisian, MD   1,000 mL at 06/02/12 1154  . valproate (DEPACON) 500 mg in dextrose 5 % 50 mL IVPB  500 mg Intravenous Once Thana Farr, MD       Current Outpatient Prescriptions  Medication Sig Dispense Refill  . acetaminophen (TYLENOL) 500 MG tablet Take 1,000 mg by mouth every 6 (six) hours as needed. Headache pain or fever       . aspirin 325 MG tablet Take 1 tablet (324 mg total) by mouth daily.  30 tablet  12  . divalproex (DEPAKOTE) 500 MG DR tablet Take 500 mg by mouth 2 (two) times daily.      Marland Kitchen lisinopril-hydrochlorothiazide (PRINZIDE,ZESTORETIC) 10-12.5 MG per tablet Take 1 tablet by mouth daily.        . metroNIDAZOLE (METROGEL) 0.75 % vaginal gel Place 1 Applicatorful vaginally 2 (two) times daily.      . mometasone (NASONEX) 50 MCG/ACT nasal spray Place 2 sprays into the nose daily.  17 g  0  . omeprazole (PRILOSEC) 20 MG capsule  Take 20 mg by mouth daily.      . rosuvastatin (CRESTOR) 10 MG tablet Take 10 mg by mouth daily.      Marland Kitchen tiZANidine (ZANAFLEX) 2 MG tablet Take 2 mg by mouth 2 (two) times daily.      Marland Kitchen topiramate (TOPAMAX) 100 MG tablet Take 100 mg by mouth 2 (two) times daily.      . traMADol (ULTRAM) 50 MG tablet Take 50 mg by mouth every 6 (six) hours as needed. For pain      . vitamin C (ASCORBIC ACID) 500 MG tablet Take 1,000 mg by mouth daily.      Marland Kitchen isometheptene-acetaminophen-dichloralphenazone (MIDRIN) 65-325-100 MG capsule Take 1 capsule by mouth every 4 (four) hours as needed.  30 capsule  1      ROS:                                                                                                                                       History obtained from the patient  General ROS: negative for - chills, fatigue, fever, night sweats, weight gain or weight loss Psychological ROS: negative for - behavioral disorder, hallucinations, memory difficulties, mood swings or suicidal ideation Ophthalmic ROS: negative for - blurry vision, double vision, eye pain or loss  of vision ENT ROS: negative for - epistaxis, nasal discharge, oral lesions, sore throat, tinnitus or vertigo Allergy and Immunology ROS: negative for - hives or itchy/watery eyes Hematological and Lymphatic ROS: negative for - bleeding problems, bruising or swollen lymph nodes Endocrine ROS: negative for - galactorrhea, hair pattern changes, polydipsia/polyuria or temperature intolerance Respiratory ROS: negative for - cough, hemoptysis, shortness of breath or wheezing Cardiovascular ROS: negative for - chest pain, dyspnea on exertion, edema or irregular heartbeat Gastrointestinal ROS: negative for - abdominal pain, diarrhea, hematemesis, nausea/vomiting or stool incontinence Genito-Urinary ROS: negative for - dysuria, hematuria, incontinence or urinary frequency/urgency Musculoskeletal ROS: negative for - joint swelling or muscular  weakness Neurological ROS: as noted in HPI Dermatological ROS: negative for rash and skin lesion changes   Blood pressure 113/56, pulse 101, temperature 98 F (36.7 C), temperature source Oral, resp. rate 16, SpO2 98.00%.   Neurologic Examination:                                                                                                      Mental Status: Alert, oriented, thought content appropriate.  Speech fluent without evidence of aphasia.  Able to follow 3 step commands without difficulty. Cranial Nerves: II: Discs flat bilaterally; Visual fields grossly normal, pupils equal, round, reactive to light and accommodation III,IV, VI: ptosis not present, extra-ocular motions intact bilaterally V,VII: smile symmetric, facial light touch sensation decreased on left face from V1-V3 VIII: hearing normal bilaterally IX,X: gag reflex present XI: bilateral shoulder shrug XII: midline tongue extension Motor: Right : Upper extremity   5/5    Left:     Upper extremity   4/5  Lower extremity   5/5     Lower extremity   5/5 --drift on left upper extremity Tone and bulk:normal tone throughout; no atrophy noted Sensory: Pinprick and light touch intact throughout stated to be decreased on left arm Deep Tendon Reflexes: 2+ and symmetric throughout Plantars: Right: downgoing   Left: downgoing Cerebellar: normal finger-to-nose,  normal heel-to-shin test CV: pulses palpable throughout     Lab Results  Component Value Date/Time   CHOL 188 10/29/2011  5:35 AM    Results for orders placed during the hospital encounter of 06/02/12 (from the past 48 hour(s))  GLUCOSE, CAPILLARY     Status: Abnormal   Collection Time   06/02/12 10:54 AM      Component Value Range Comment   Glucose-Capillary 125 (*) 70 - 99 mg/dL    Comment 1 Notify RN     CBC WITH DIFFERENTIAL     Status: Normal   Collection Time   06/02/12 10:56 AM      Component Value Range Comment   WBC 8.1  4.0 - 10.5 K/uL    RBC 4.20   3.87 - 5.11 MIL/uL    Hemoglobin 12.8  12.0 - 15.0 g/dL    HCT 16.1  09.6 - 04.5 %    MCV 89.0  78.0 - 100.0 fL    MCH 30.5  26.0 - 34.0 pg    MCHC 34.2  30.0 -  36.0 g/dL    RDW 29.5  62.1 - 30.8 %    Platelets 208  150 - 400 K/uL    Neutrophils Relative 64  43 - 77 %    Neutro Abs 5.2  1.7 - 7.7 K/uL    Lymphocytes Relative 28  12 - 46 %    Lymphs Abs 2.3  0.7 - 4.0 K/uL    Monocytes Relative 7  3 - 12 %    Monocytes Absolute 0.6  0.1 - 1.0 K/uL    Eosinophils Relative 1  0 - 5 %    Eosinophils Absolute 0.0  0.0 - 0.7 K/uL    Basophils Relative 0  0 - 1 %    Basophils Absolute 0.0  0.0 - 0.1 K/uL   POCT I-STAT TROPONIN I     Status: Normal   Collection Time   06/02/12 11:16 AM      Component Value Range Comment   Troponin i, poc 0.01  0.00 - 0.08 ng/mL    Comment 3            POCT I-STAT, CHEM 8     Status: Abnormal   Collection Time   06/02/12 11:17 AM      Component Value Range Comment   Sodium 143  135 - 145 mEq/L    Potassium 3.1 (*) 3.5 - 5.1 mEq/L    Chloride 106  96 - 112 mEq/L    BUN 12  6 - 23 mg/dL    Creatinine, Ser 6.57  0.50 - 1.10 mg/dL    Glucose, Bld 89  70 - 99 mg/dL    Calcium, Ion 8.46  9.62 - 1.23 mmol/L    TCO2 23  0 - 100 mmol/L    Hemoglobin 12.9  12.0 - 15.0 g/dL    HCT 95.2  84.1 - 32.4 %     Ct Head Wo Contrast  06/02/2012  *RADIOLOGY REPORT*  Clinical Data: Left-sided numbness and facial droop  CT HEAD WITHOUT CONTRAST  Technique:  Contiguous axial images were obtained from the base of the skull through the vertex without contrast.  Comparison: 10/28/2011 MRI  Findings: There is no evidence for acute hemorrhage, hydrocephalus, mass lesion, or abnormal extra-axial fluid collection.  No definite CT evidence for acute infarction.  The visualized paranasal sinuses and mastoid air cells are predominately clear.  IMPRESSION: No CT evidence of acute intracranial abnormality.  Discussed via telephone with Felicie Morn, PA with Neurology at 11:30 a.m. on  06/02/2012.   Original Report Authenticated By: Jearld Lesch, M.D.     Felicie Morn PA-C [facility] [phone]  06/02/2012, 12:02 PM    Patient seen and examined.  Clinical course and management discussed.  Necessary edits performed.  I agree with the above.  Assessment and plan of care developed and discussed below.    Assessment: 46 year old female presenting with left sided complaints and headache.  Does have a history of stroke without residual that occurred in April of this year.  CT is unremarkable.  Symptoms resolving spontaneously.  Likely a complicated migraine.  Patient on Topamax and Depakote for [facility].  Plan: 1. With history of stroke recommend MRI of the brain without contrast 2.  Toradol, Benadryl and Reglan to be used for headache abortion 3.  Unless acute infarct noted on imaging do not recommend stroke work up at this time. 4.  Continue prophylactic therapy.    Thana Farr, MD [facility] [phone]  06/02/2012  2:37  PM   

## 2012-06-02 NOTE — ED Provider Notes (Signed)
History     CSN: [id number]  Arrival date & time 06/02/12  1008   First MD Initiated Contact with Patient 06/02/12 1054      Chief Complaint  Patient presents with  . Numbness    (Consider location/radiation/quality/duration/timing/severity/associated sxs/prior treatment) HPI Comments: Patient presents with gradual onset the headache started 2 days ago. It is similar to prior headaches although more severe. It is frontal, no pain over back of head or neck. Denies neck stiffness. At 10 AM she began to experience paresthesias in her left face and left arm and weakness in her left face and arm. This is what caused her to come to the ER. She also has mild intermittent blurred vision of left eye. Of note, she was occurred stroke in April 2013 and admitted and diagnosed with a TIA versus a complicated migraine at that time. She had normal MRI and was risk stratified appropriately, she has been on aspirin since.  Patient is a 46 y.o. female presenting with headaches. The history is provided by the patient. No language interpreter was used.  Headache  This is a recurrent problem. The current episode started 2 days ago. The problem occurs constantly. The problem has been gradually worsening. The headache is associated with bright light and loud noise. The pain is located in the frontal and bilateral region. The quality of the pain is described as dull. The pain is at a severity of 10/10. The pain is severe. The pain does not radiate. Pertinent negatives include no anorexia, no fever, no malaise/fatigue, no chest pressure, no shortness of breath, no nausea and no vomiting. She has tried NSAIDs for the symptoms. The treatment provided no relief.    Past Medical History  Diagnosis Date  . Depression   . Fatigue   . Chronic headache   . TMJ pain dysfunction syndrome   . Obesity   . Sinusitis   . Hypertension   . Hypercholesterolemia   . Transient ischemic attack (TIA)     Past Surgical History    Procedure Date  . Back surgery     x2  . Torn disk l4 l5 s1     surgery by [facility] 9/11 +10/11  . Nasal sinus surgery   . Vaginal hysterectomy     for DUB in 10/2003  . Cholecystectomy     11/19/09    Family History  Problem Relation Age of Onset  . Cervical cancer Maternal Grandmother   . Cancer Maternal Grandmother     mouth  . Hypertension Brother   . Heart failure Brother     History  Substance Use Topics  . Smoking status: Former Smoker -- 20 years    Types: Cigarettes    Quit date: 07/28/2003  . Smokeless tobacco: Not on file     Comment: 3/4 of pack  . Alcohol Use: No    OB History    Grav Para Term Preterm Abortions TAB SAB Ect Mult Living                  Review of Systems  Constitutional: Negative for fever, chills, malaise/fatigue, activity change and appetite change.  HENT: Negative for congestion, rhinorrhea, neck pain, neck stiffness and sinus pressure.   Eyes: Negative for discharge and visual disturbance.  Respiratory: Negative for cough, chest tightness, shortness of breath, wheezing and stridor.   Cardiovascular: Negative for chest pain and leg swelling.  Gastrointestinal: Negative for nausea, vomiting, abdominal pain, diarrhea, abdominal distention and anorexia.  Genitourinary:  Negative for decreased urine volume and difficulty urinating.  Musculoskeletal: Negative for back pain and arthralgias.  Skin: Negative for color change and pallor.  Neurological: Positive for facial asymmetry (L face), weakness (L face and LUE), numbness (L face and LUE) and headaches. Negative for speech difficulty and light-headedness.  Psychiatric/Behavioral: Negative for behavioral problems and agitation.  All other systems reviewed and are negative.    Allergies  Oxycodone-acetaminophen; Oxycodone-acetaminophen; and Tylox  Home Medications   Current Outpatient Rx  Name  Route  Sig  Dispense  Refill  . ACETAMINOPHEN 500 MG PO TABS   Oral   Take 1,000 mg by  mouth every 6 (six) hours as needed. Headache pain or fever          . ASPIRIN 325 MG PO TABS   Oral   Take 1 tablet (324 mg total) by mouth daily.   30 tablet   12   . DIVALPROEX SODIUM 500 MG PO TBEC   Oral   Take 500 mg by mouth 2 (two) times daily.         Marland Kitchen LISINOPRIL-HYDROCHLOROTHIAZIDE 10-12.5 MG PO TABS   Oral   Take 1 tablet by mouth daily.           Marland Kitchen METRONIDAZOLE 0.75 % VA GEL   Vaginal   Place 1 Applicatorful vaginally 2 (two) times daily.         . MOMETASONE FUROATE 50 MCG/ACT NA SUSP   Nasal   Place 2 sprays into the nose daily.   17 g   0   . OMEPRAZOLE 20 MG PO CPDR   Oral   Take 20 mg by mouth daily.         Marland Kitchen ROSUVASTATIN CALCIUM 10 MG PO TABS   Oral   Take 10 mg by mouth daily.         Marland Kitchen TIZANIDINE HCL 2 MG PO TABS   Oral   Take 2 mg by mouth 2 (two) times daily.         . TOPIRAMATE 100 MG PO TABS   Oral   Take 100 mg by mouth 2 (two) times daily.         . TRAMADOL HCL 50 MG PO TABS   Oral   Take 50 mg by mouth every 6 (six) hours as needed. For pain         . VITAMIN C 500 MG PO TABS   Oral   Take 1,000 mg by mouth daily.         . ISOMETHEPTENE-APAP-DICHLORAL 65-325-100 MG PO CAPS   Oral   Take 1 capsule by mouth every 4 (four) hours as needed.   30 capsule   1     BP 113/56  Pulse 101  Temp 98 F (36.7 C) (Oral)  Resp 16  SpO2 98%  Physical Exam  Nursing note and vitals reviewed. Constitutional: She is oriented to person, place, and time. She appears well-developed and well-nourished. No distress.  HENT:  Head: Normocephalic and atraumatic.  Mouth/Throat: No oropharyngeal exudate.  Eyes: EOM are normal. Pupils are equal, round, and reactive to light. Right eye exhibits no discharge. Left eye exhibits no discharge.  Neck: Normal range of motion. Neck supple. No JVD present.  Cardiovascular: Normal rate, regular rhythm and normal heart sounds.   Pulmonary/Chest: Effort normal and breath sounds  normal. No stridor. No respiratory distress. She exhibits no tenderness.  Abdominal: Soft. Bowel sounds are normal. She exhibits no distension. There  is no tenderness. There is no guarding.  Musculoskeletal: Normal range of motion. She exhibits no edema and no tenderness.  Neurological: She is alert and oriented to person, place, and time. She has normal reflexes. She displays normal reflexes. No cranial nerve deficit. She exhibits normal muscle tone.       4/5 LUE biceps and triceps and grip, 5/5 RUE. Very mild L facial droop, puffs cheeks out symmetricly and smiles symmetrically. No meningismus.  Skin: Skin is warm and dry. No rash noted. She is not diaphoretic.  Psychiatric: She has a normal mood and affect. Her behavior is normal. Judgment and thought content normal.    ED Course  Procedures (including critical care time)  Labs Reviewed  GLUCOSE, CAPILLARY - Abnormal; Notable for the following:    Glucose-Capillary 125 (*)     All other components within normal limits  POCT I-STAT, CHEM 8 - Abnormal; Notable for the following:    Potassium 3.1 (*)     All other components within normal limits  CBC WITH DIFFERENTIAL  POCT I-STAT TROPONIN I  BASIC METABOLIC PANEL   Ct Head Wo Contrast  06/02/2012  *RADIOLOGY REPORT*  Clinical Data: Left-sided numbness and facial droop  CT HEAD WITHOUT CONTRAST  Technique:  Contiguous axial images were obtained from the base of the skull through the vertex without contrast.  Comparison: 10/28/2011 MRI  Findings: There is no evidence for acute hemorrhage, hydrocephalus, mass lesion, or abnormal extra-axial fluid collection.  No definite CT evidence for acute infarction.  The visualized paranasal sinuses and mastoid air cells are predominately clear.  IMPRESSION: No CT evidence of acute intracranial abnormality.  Discussed via telephone with Felicie Morn, PA with Neurology at 11:30 a.m. on 06/02/2012.   Original Report Authenticated By: Jearld Lesch, M.D.        1. Headache   2. Paresthesia   3. Left arm weakness       MDM  11:14 AM code stroke called. May have complicated migraine but due to onset of paresthesias and L weakness 1 hr ago code stroke called. On exam pt is 4/5 LUE, very mild facial droop L corner of mouth. Alert, airway intact. Will also give migraine cocktail. Doubt SAH, meningitis, dsvt, denies trauma.  12:03 PM HA improving. Strength nml now. Neuro has ordered MRI. Will send to CDU while awaiting results.       Warrick Parisian, MD 06/02/12 [phone]

## 2012-06-02 NOTE — ED Provider Notes (Signed)
I have supervised the resident on the management of this patient and agree with the note above. I personally interviewed and examined the patient and my addendum is below.   Wanda [name] is a 46 y.o. female hx of migraines, TIA here with acute onset of paresthesia and L sided weakness. Stroke code activated. Neuro examined patient. Recommended MRI to r/o stroke but they think its likely to be complicated migraine. Patient given migraine cocktail. MRI pending and patient transferred to the CDU.    Richardean Canal, MD 06/02/12 [phone]

## 2012-06-02 NOTE — ED Notes (Signed)
Per pt allergic to Depacon. sts she had a large dose in her Neurologist office before and broke out all over with a rash. MD aware. Depacon canceled.

## 2012-06-02 NOTE — ED Provider Notes (Signed)
12:55 PM Patient with a hx sig for left facial and arm paresthesia was placed in CDU on TIA protocol by Dr. Sherilyn Cooter (ED resident). Patient care resumed from Dr Sherilyn Cooter .  Patient is here for imaging and observation. Patient re-evaluated and is resting comfortable, VSS, with no new complaints or concerns at this time. Plan per previous provider is to disposition the patient based on imaging results. On exam: hemodynamically stable, NAD, heart w/ RRR, lungs CTAB, Chest & abd non-tender, no peripheral edema or calf tenderness. No neuro deficits at this time.    2:57 PM MRI shows no acute infarct. Patient can be discharged an return with worsening or concerning symptoms.   Wanda Beck, PA-C 06/03/12 1558

## 2012-06-02 NOTE — ED Notes (Signed)
Patient transported to MRI.  No distress noted.  Resp symmetrical and unlabored.

## 2012-06-02 NOTE — ED Notes (Signed)
Code stroke called

## 2012-06-02 NOTE — ED Notes (Signed)
Pt c/o L sided numbness and headache x 2 days. Hx TIA. Grips =bilateral, no arm drift, mild facial droop noted in traige, but speech is clear and normal. Pt is tearful. A&Ox4.

## 2012-06-03 NOTE — ED Provider Notes (Signed)
Medical screening examination/treatment/procedure(s) were performed by non-physician practitioner and as supervising physician I was immediately available for consultation/collaboration.   Alben Jepsen H Avyana Puffenbarger, MD 06/03/12 1603 

## 2012-08-10 ENCOUNTER — Other Ambulatory Visit: Payer: Self-pay | Admitting: *Deleted

## 2012-10-17 ENCOUNTER — Emergency Department (HOSPITAL_COMMUNITY)
Admission: EM | Admit: 2012-10-17 | Discharge: 2012-10-17 | Disposition: A | Discharge status: Home / Self Care | Payer: 59 | Attending: Emergency Medicine | Admitting: Emergency Medicine

## 2012-10-17 ENCOUNTER — Encounter (HOSPITAL_COMMUNITY): Payer: Self-pay | Admitting: Emergency Medicine

## 2012-10-17 DIAGNOSIS — F329 Major depressive disorder, single episode, unspecified: Secondary | ICD-10-CM | POA: Insufficient documentation

## 2012-10-17 DIAGNOSIS — F3289 Other specified depressive episodes: Secondary | ICD-10-CM | POA: Insufficient documentation

## 2012-10-17 DIAGNOSIS — M26609 Unspecified temporomandibular joint disorder, unspecified side: Secondary | ICD-10-CM | POA: Insufficient documentation

## 2012-10-17 DIAGNOSIS — E669 Obesity, unspecified: Secondary | ICD-10-CM | POA: Insufficient documentation

## 2012-10-17 DIAGNOSIS — Z7982 Long term (current) use of aspirin: Secondary | ICD-10-CM | POA: Insufficient documentation

## 2012-10-17 DIAGNOSIS — Z8709 Personal history of other diseases of the respiratory system: Secondary | ICD-10-CM | POA: Insufficient documentation

## 2012-10-17 DIAGNOSIS — Z8673 Personal history of transient ischemic attack (TIA), and cerebral infarction without residual deficits: Secondary | ICD-10-CM | POA: Insufficient documentation

## 2012-10-17 DIAGNOSIS — I10 Essential (primary) hypertension: Secondary | ICD-10-CM | POA: Insufficient documentation

## 2012-10-17 DIAGNOSIS — G43909 Migraine, unspecified, not intractable, without status migrainosus: Secondary | ICD-10-CM | POA: Insufficient documentation

## 2012-10-17 DIAGNOSIS — Z87891 Personal history of nicotine dependence: Secondary | ICD-10-CM | POA: Insufficient documentation

## 2012-10-17 DIAGNOSIS — E7800 Pure hypercholesterolemia, unspecified: Secondary | ICD-10-CM | POA: Insufficient documentation

## 2012-10-17 DIAGNOSIS — Z79899 Other long term (current) drug therapy: Secondary | ICD-10-CM | POA: Insufficient documentation

## 2012-10-17 MED ORDER — KETOROLAC TROMETHAMINE 30 MG/ML IJ SOLN
30.0000 mg | Freq: Once | INTRAMUSCULAR | Status: AC
  Administered 2012-10-17: 30 mg via INTRAVENOUS
  Filled 2012-10-17: qty 1

## 2012-10-17 MED ORDER — DIPHENHYDRAMINE HCL 50 MG/ML IJ SOLN
25.0000 mg | Freq: Once | INTRAMUSCULAR | Status: AC
  Administered 2012-10-17: 25 mg via INTRAMUSCULAR
  Filled 2012-10-17: qty 1

## 2012-10-17 MED ORDER — ALPRAZOLAM 0.25 MG PO TABS
0.5000 mg | ORAL_TABLET | Freq: Once | ORAL | Status: AC
  Administered 2012-10-17: 0.5 mg via ORAL
  Filled 2012-10-17: qty 2

## 2012-10-17 MED ORDER — PROMETHAZINE HCL 25 MG/ML IJ SOLN
25.0000 mg | Freq: Once | INTRAMUSCULAR | Status: AC
  Administered 2012-10-17: 25 mg via INTRAMUSCULAR
  Filled 2012-10-17: qty 1

## 2012-10-17 NOTE — ED Notes (Signed)
Pt is resting in bed with family at bed side. She requested a cold drink with a straw.  Pt is still stuttering.
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
## 2012-10-17 NOTE — ED Notes (Signed)
Reviewed follow up instructions with patient and daughter
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
## 2012-10-19 NOTE — ED Provider Notes (Signed)
Medical screening examination/treatment/procedure(s) were performed by non-physician practitioner and as supervising physician I was immediately available for consultation/collaboration.  Juliet Rude. Rubin Payor, MD 10/19/12 [phone]

## 2012-10-21 ENCOUNTER — Ambulatory Visit (INDEPENDENT_AMBULATORY_CARE_PROVIDER_SITE_OTHER): Payer: 59 | Admitting: Neurology

## 2012-10-21 ENCOUNTER — Encounter: Payer: Self-pay | Admitting: Neurology

## 2012-10-21 DIAGNOSIS — G43019 Migraine without aura, intractable, without status migrainosus: Secondary | ICD-10-CM

## 2012-10-21 MED ORDER — ONABOTULINUMTOXINA 100 UNITS IJ SOLR
200.0000 [IU] | Freq: Once | INTRAMUSCULAR | Status: AC
  Administered 2012-10-21: 200 [IU] via INTRAMUSCULAR

## 2012-10-21 NOTE — Patient Instructions (Addendum)
Patient given 200 units of botox  per migraine protocol. She tolerated the injections well without any major side effects. She was advised to continue Zanaflex and Relpax for symptomatic relief and Depakote for headache prophylaxis. I encouraged  her to do regular neck stretching exercises. Return for followup in 3 months

## 2012-10-23 NOTE — Progress Notes (Signed)
HPI:  47 year  lady with chronic headaches which are transformed mixed migraine headaches with tension. She has failed trials of Topamax, Depakote in the past but has refused Botox. She also has remote history of TIA in April 2013 but has remained stable from a vascular standpoint.  She returns for followup of the last visit on 08/09/12. She continues to have significant headaches which he describes as being severe and disabling 3-4 days per week. She takes Depakote 500 twice a day as well as takes Zanaflex and Relpax for symptomatic relief but this is only temporary. She was unable to tolerate Topamax in the past for prophylaxis and had considered but had refused Botox. She is here today and willing to try Botox for headache prophylaxis. ROS:  14 system review of systems is positive for headache, neck pain, muscle spasm, dizziness, sleeping difficulties. Negative for chest pain, palpitations, shortness of breath, diarrhea. developed, well nourished, seated, in no evident distress Head: head normocephalic and atraumatic. Orohparynx benign Neck: supple with no carotid or supraclavicular bruits Cardiovascular: regular rate and rhythm, no murmurs Musculoskeletal mild spasm posterior neck muscles and restriction of range of motion  Neurologic Exam Mental Status: Awake and fully alert. Oriented to place and time. Recent and remote memory intact. Attention span, concentration and fund of knowledge appropriate. Mood and affect appropriate.  Cranial Nerves: Fundoscopic exam reveals sharp disc margins. Pupils equal, briskly reactive to light. Extraocular movements full without nystagmus. Visual fields full to confrontation. Hearing intact and symmetric to finer snap. Facial sensation intact. Face, tongue, palate move normally and symmetrically. Neck flexion and extension normal.  Motor: Normal bulk and tone. Normal strength in all tested extremity muscles. Sensory.: intact to tough and pinprick and vibratory.   Coordination: Rapid alternating movements normal in all extremities. Finger-to-nose and heel-to-shin performed accurately bilaterally. Gait and Station: Arises from chair without difficulty. Stance is normal. Gait demonstrates normal stride length and balance . Able to heel, toe and tandem walk without difficulty.  Reflexes: 1+ and symmetric. Toes downgoing.     ASSESSMENT::  47 year old lady with chronic headaches likely transformed mixed migraine headaches with tension. She has failed trials of Topamax, Depakote and previously refused Botox. Remote history of TIA in April 2013.   PLAN:  Continued Depakote for headache prevention and Zanaflex and Relpax for symptomatic relief. I encouraged her to do regular neck stretching exercises and participate in regular activities for stress relaxation as well. We will give Botox injections today for migraine prevention.  BOTOX PROCEDURE NOTE :  After taking written informed consent from the patient explaining risk benefit specifically pain, infection, bleeding and muscle paralysis 200 units of Botox were administered intramuscularly into the face and neck muscles under aseptic precautions. The orbicularis, mentalis, frontalis, temporalis, occipitalis, posterior neck and upper scapular muscles were injected at sites as per the migraine Botox protocol. The remaining injection sites were at trigger points shown by the patient. A total of 200 units were injected with no amount wasted.  .The patient tolerated the procedure well without any major complications noted. She was advised to return back in 3 months for additional Botox injections

## 2012-10-27 DIAGNOSIS — I639 Cerebral infarction, unspecified: Secondary | ICD-10-CM

## 2012-10-27 HISTORY — DX: Cerebral infarction, unspecified: I63.9

## 2012-10-28 ENCOUNTER — Observation Stay (HOSPITAL_COMMUNITY): Payer: 59

## 2012-10-28 ENCOUNTER — Emergency Department (HOSPITAL_COMMUNITY): Payer: 59

## 2012-10-28 ENCOUNTER — Observation Stay (HOSPITAL_COMMUNITY)
Admission: EM | Admit: 2012-10-28 | Discharge: 2012-10-29 | Disposition: A | Discharge status: Home / Self Care | Payer: 59 | Attending: Internal Medicine | Admitting: Internal Medicine

## 2012-10-28 ENCOUNTER — Encounter (HOSPITAL_COMMUNITY): Payer: Self-pay | Admitting: Cardiology

## 2012-10-28 DIAGNOSIS — Z87891 Personal history of nicotine dependence: Secondary | ICD-10-CM

## 2012-10-28 DIAGNOSIS — R5381 Other malaise: Secondary | ICD-10-CM

## 2012-10-28 DIAGNOSIS — M26629 Arthralgia of temporomandibular joint, unspecified side: Secondary | ICD-10-CM

## 2012-10-28 DIAGNOSIS — R030 Elevated blood-pressure reading, without diagnosis of hypertension: Secondary | ICD-10-CM

## 2012-10-28 DIAGNOSIS — R29898 Other symptoms and signs involving the musculoskeletal system: Secondary | ICD-10-CM | POA: Insufficient documentation

## 2012-10-28 DIAGNOSIS — R4789 Other speech disturbances: Secondary | ICD-10-CM | POA: Insufficient documentation

## 2012-10-28 DIAGNOSIS — R51 Headache: Secondary | ICD-10-CM

## 2012-10-28 DIAGNOSIS — J310 Chronic rhinitis: Secondary | ICD-10-CM

## 2012-10-28 DIAGNOSIS — F329 Major depressive disorder, single episode, unspecified: Secondary | ICD-10-CM

## 2012-10-28 DIAGNOSIS — R5383 Other fatigue: Secondary | ICD-10-CM

## 2012-10-28 DIAGNOSIS — M6281 Muscle weakness (generalized): Secondary | ICD-10-CM

## 2012-10-28 DIAGNOSIS — E669 Obesity, unspecified: Secondary | ICD-10-CM

## 2012-10-28 DIAGNOSIS — R531 Weakness: Secondary | ICD-10-CM

## 2012-10-28 DIAGNOSIS — R209 Unspecified disturbances of skin sensation: Secondary | ICD-10-CM

## 2012-10-28 DIAGNOSIS — R109 Unspecified abdominal pain: Secondary | ICD-10-CM

## 2012-10-28 DIAGNOSIS — J329 Chronic sinusitis, unspecified: Secondary | ICD-10-CM

## 2012-10-28 DIAGNOSIS — S335XXA Sprain of ligaments of lumbar spine, initial encounter: Secondary | ICD-10-CM

## 2012-10-28 DIAGNOSIS — G43109 Migraine with aura, not intractable, without status migrainosus: Principal | ICD-10-CM

## 2012-10-28 DIAGNOSIS — K219 Gastro-esophageal reflux disease without esophagitis: Secondary | ICD-10-CM

## 2012-10-28 DIAGNOSIS — R29810 Facial weakness: Secondary | ICD-10-CM | POA: Insufficient documentation

## 2012-10-28 DIAGNOSIS — I10 Essential (primary) hypertension: Secondary | ICD-10-CM

## 2012-10-28 DIAGNOSIS — J069 Acute upper respiratory infection, unspecified: Secondary | ICD-10-CM

## 2012-10-28 DIAGNOSIS — I639 Cerebral infarction, unspecified: Secondary | ICD-10-CM

## 2012-10-28 DIAGNOSIS — E7800 Pure hypercholesterolemia, unspecified: Secondary | ICD-10-CM

## 2012-10-28 DIAGNOSIS — F3289 Other specified depressive episodes: Secondary | ICD-10-CM

## 2012-10-28 LAB — URINALYSIS, ROUTINE W REFLEX MICROSCOPIC
Bilirubin Urine: NEGATIVE
Hgb urine dipstick: NEGATIVE
Ketones, ur: NEGATIVE mg/dL
Nitrite: NEGATIVE
pH: 7 (ref 5.0–8.0)

## 2012-10-28 LAB — CREATININE, SERUM
Creatinine, Ser: 0.64 mg/dL (ref 0.50–1.10)
GFR calc non Af Amer: >90 mL/min (ref >90)

## 2012-10-28 LAB — POCT I-STAT, CHEM 8
BUN: 11 mg/dL (ref 6–23)
Chloride: 105 mEq/L (ref 96–112)
Creatinine, Ser: 0.7 mg/dL (ref 0.50–1.10)
Glucose, Bld: 108 mg/dL — ABNORMAL HIGH (ref 70–99)
Hemoglobin: 13.6 g/dL (ref 12.0–15.0)
Potassium: 3.5 mEq/L (ref 3.5–5.1)
Sodium: 143 mEq/L (ref 135–145)

## 2012-10-28 LAB — CBC
MCH: 29.8 pg (ref 26.0–34.0)
MCHC: 33.9 g/dL (ref 30.0–36.0)
MCHC: 33.6 g/dL (ref 30.0–36.0)
MCV: 87.9 fL (ref 78.0–100.0)
Platelets: 189 10*3/uL (ref 150–400)
Platelets: 166 10*3/uL (ref 150–400)
RDW: 13 % (ref 11.5–15.5)
RDW: 13.1 % (ref 11.5–15.5)

## 2012-10-28 LAB — RAPID URINE DRUG SCREEN, HOSP PERFORMED
Barbiturates: NOT DETECTED
Benzodiazepines: NOT DETECTED
Cocaine: NOT DETECTED
Opiates: NOT DETECTED

## 2012-10-28 LAB — ETHANOL: Alcohol, Ethyl (B): <11 mg/dL (ref 0–11)

## 2012-10-28 LAB — COMPREHENSIVE METABOLIC PANEL
ALT: 15 U/L (ref 0–35)
AST: 19 U/L (ref 0–37)
Calcium: 9 mg/dL (ref 8.4–10.5)
Creatinine, Ser: 0.72 mg/dL (ref 0.50–1.10)
GFR calc Af Amer: >90 mL/min (ref >90)
Sodium: 141 mEq/L (ref 135–145)
Total Protein: 7.1 g/dL (ref 6.0–8.3)

## 2012-10-28 LAB — PROTIME-INR: Prothrombin Time: 12.7 seconds (ref 11.6–15.2)

## 2012-10-28 LAB — DIFFERENTIAL
Basophils Absolute: 0 10*3/uL (ref 0.0–0.1)
Eosinophils Absolute: 0.1 10*3/uL (ref 0.0–0.7)
Eosinophils Relative: 1 % (ref 0–5)

## 2012-10-28 LAB — POCT I-STAT TROPONIN I: Troponin i, poc: 0.01 ng/mL (ref 0.00–0.08)

## 2012-10-28 MED ORDER — ELETRIPTAN HYDROBROMIDE 40 MG PO TABS
40.0000 mg | ORAL_TABLET | ORAL | Status: DC | PRN
  Administered 2012-10-29: 40 mg via ORAL
  Filled 2012-10-28: qty 1

## 2012-10-28 MED ORDER — SERTRALINE HCL 100 MG PO TABS
100.0000 mg | ORAL_TABLET | Freq: Every day | ORAL | Status: DC
  Administered 2012-10-29: 100 mg via ORAL
  Filled 2012-10-28: qty 1

## 2012-10-28 MED ORDER — ACETAMINOPHEN 325 MG PO TABS: 650.0000 mg | ORAL_TABLET | Freq: Four times a day (QID) | ORAL | Status: DC | PRN

## 2012-10-28 MED ORDER — ATORVASTATIN CALCIUM 20 MG PO TABS
20.0000 mg | ORAL_TABLET | Freq: Every day | ORAL | Status: DC
  Filled 2012-10-28: qty 1

## 2012-10-28 MED ORDER — CLONAZEPAM 0.5 MG PO TABS
0.5000 mg | ORAL_TABLET | Freq: Two times a day (BID) | ORAL | Status: DC
  Administered 2012-10-28 – 2012-10-29 (×2): 0.5 mg via ORAL
  Filled 2012-10-28 (×2): qty 1

## 2012-10-28 MED ORDER — METHOCARBAMOL 100 MG/ML IJ SOLN
500.0000 mg | Freq: Four times a day (QID) | INTRAVENOUS | Status: DC | PRN
  Filled 2012-10-28: qty 5

## 2012-10-28 MED ORDER — TIZANIDINE HCL 2 MG PO TABS
2.0000 mg | ORAL_TABLET | Freq: Two times a day (BID) | ORAL | Status: DC
  Administered 2012-10-28 – 2012-10-29 (×2): 2 mg via ORAL
  Filled 2012-10-28 (×3): qty 1

## 2012-10-28 MED ORDER — HYDROMORPHONE HCL PF 1 MG/ML IJ SOLN: 1.0000 mg | INTRAMUSCULAR | Status: DC | PRN

## 2012-10-28 MED ORDER — ASPIRIN 325 MG PO TABS
325.0000 mg | ORAL_TABLET | Freq: Every day | ORAL | Status: DC
  Administered 2012-10-29: 325 mg via ORAL
  Filled 2012-10-28: qty 1

## 2012-10-28 MED ORDER — FENTANYL CITRATE 0.05 MG/ML IJ SOLN
100.0000 ug | Freq: Once | INTRAMUSCULAR | Status: AC
  Administered 2012-10-28: 100 ug via INTRAVENOUS
  Filled 2012-10-28: qty 2

## 2012-10-28 MED ORDER — KETOROLAC TROMETHAMINE 10 MG PO TABS
10.0000 mg | ORAL_TABLET | Freq: Every day | ORAL | Status: DC
  Administered 2012-10-28 – 2012-10-29 (×2): 10 mg via ORAL
  Filled 2012-10-28 (×3): qty 1

## 2012-10-28 MED ORDER — DIVALPROEX SODIUM 500 MG PO DR TAB
500.0000 mg | DELAYED_RELEASE_TABLET | Freq: Two times a day (BID) | ORAL | Status: DC
  Administered 2012-10-28 – 2012-10-29 (×2): 500 mg via ORAL
  Filled 2012-10-28 (×3): qty 1

## 2012-10-28 MED ORDER — ONDANSETRON HCL 4 MG PO TABS: 4.0000 mg | ORAL_TABLET | Freq: Four times a day (QID) | ORAL | Status: DC | PRN

## 2012-10-28 MED ORDER — TRAMADOL HCL 50 MG PO TABS
50.0000 mg | ORAL_TABLET | Freq: Four times a day (QID) | ORAL | Status: DC | PRN
  Administered 2012-10-29: 50 mg via ORAL
  Filled 2012-10-28 (×2): qty 1

## 2012-10-28 MED ORDER — METRONIDAZOLE 0.75 % VA GEL
1.0000 | Freq: Two times a day (BID) | VAGINAL | Status: DC
  Administered 2012-10-28: 1 via VAGINAL
  Filled 2012-10-28 (×2): qty 70

## 2012-10-28 MED ORDER — SODIUM CHLORIDE 0.9 % IJ SOLN
3.0000 mL | Freq: Two times a day (BID) | INTRAMUSCULAR | Status: DC
  Administered 2012-10-28 – 2012-10-29 (×2): 3 mL via INTRAVENOUS

## 2012-10-28 MED ORDER — ONDANSETRON HCL 4 MG/2ML IJ SOLN: 4.0000 mg | Freq: Four times a day (QID) | INTRAMUSCULAR | Status: DC | PRN

## 2012-10-28 MED ORDER — LISINOPRIL 5 MG PO TABS
5.0000 mg | ORAL_TABLET | Freq: Every day | ORAL | Status: DC
  Administered 2012-10-29: 5 mg via ORAL
  Filled 2012-10-28: qty 1

## 2012-10-28 MED ORDER — ENOXAPARIN SODIUM 40 MG/0.4ML ~~LOC~~ SOLN
40.0000 mg | SUBCUTANEOUS | Status: DC
  Administered 2012-10-28: 40 mg via SUBCUTANEOUS
  Filled 2012-10-28 (×2): qty 0.4

## 2012-10-28 MED ORDER — ACETAMINOPHEN 650 MG RE SUPP: 650.0000 mg | Freq: Four times a day (QID) | RECTAL | Status: DC | PRN

## 2012-10-28 MED ORDER — KETOROLAC TROMETHAMINE 10 MG PO TABS
15.0000 mg | ORAL_TABLET | Freq: Every day | ORAL | Status: DC
  Filled 2012-10-28: qty 2

## 2012-10-28 NOTE — ED Provider Notes (Signed)
History     CSN: [id number]  Arrival date & time 10/28/12  1512   First MD Initiated Contact with Patient 10/28/12 1527      Chief Complaint  Patient presents with  . Code Stroke     HPI Pt to department via EMS from PCP office- states she woke up tingling in her face on the left side. Went to PCP office and at 1430 she started having left sided facial droop and weakness in her left arm. Has been admitted workup for similar complaints in the past Past Medical History  Diagnosis Date  . Depression   . Fatigue   . Chronic headache   . TMJ pain dysfunction syndrome   . Obesity   . Sinusitis   . Hypertension   . Hypercholesterolemia   . Transient ischemic attack (TIA)     Past Surgical History  Procedure Laterality Date  . Back surgery      x2  . Torn disk l4 l5 s1      surgery by [facility] 9/11 +10/11  . Nasal sinus surgery    . Vaginal hysterectomy      for DUB in 10/2003  . Cholecystectomy      11/19/09  . Gallbladder surgery    . Knee arthroscopy  02/2000    Family History  Problem Relation Age of Onset  . Cervical cancer Maternal Grandmother   . Cancer Maternal Grandmother     mouth  . Hypertension Brother   . Heart failure Brother     History  Substance Use Topics  . Smoking status: Former Smoker -- 20 years    Types: Cigarettes    Quit date: 07/28/2003  . Smokeless tobacco: Not on file     Comment: 3/4 of pack  . Alcohol Use: No    OB History   Grav Para Term Preterm Abortions TAB SAB Ect Mult Living                  Review of Systems  Unable to perform ROS: Acuity of condition    Allergies  Oxycodone-acetaminophen; Oxycodone-acetaminophen; and Tylox  Home Medications   Current Outpatient Rx  Name  Route  Sig  Dispense  Refill  . aspirin 325 MG tablet   Oral   Take 325 mg by mouth daily.         . clonazePAM (KLONOPIN) 0.5 MG tablet   Oral   Take 0.5 mg by mouth 2 (two) times daily.         . divalproex (DEPAKOTE) 500 MG DR  tablet   Oral   Take 500 mg by mouth 2 (two) times daily.         Marland Kitchen eletriptan (RELPAX) 40 MG tablet   Oral   One tablet by mouth at onset of headache. May repeat in 2 hours if headache persists or recurs. may repeat in 2 hours if necessary         . Ketorolac Tromethamine (TORADOL ORAL PO)   Oral   Take 15 mg by mouth daily.         Marland Kitchen lisinopril (PRINIVIL,ZESTRIL) 5 MG tablet   Oral   Take 5 mg by mouth daily.         . metroNIDAZOLE (METROGEL) 0.75 % vaginal gel   Vaginal   Place 1 Applicatorful vaginally 2 (two) times daily.         . promethazine (PHENERGAN) 25 MG tablet   Oral   Take 25 mg  by mouth every 6 (six) hours as needed for nausea.         . rosuvastatin (CRESTOR) 10 MG tablet   Oral   Take 10 mg by mouth daily.         . sertraline (ZOLOFT) 100 MG tablet   Oral   Take 100 mg by mouth daily.         Marland Kitchen tiZANidine (ZANAFLEX) 2 MG tablet   Oral   Take 2 mg by mouth 2 (two) times daily.         . traMADol (ULTRAM) 50 MG tablet   Oral   Take 50 mg by mouth every 6 (six) hours as needed. For pain           BP 144/79  Pulse 79  Temp(Src) 98 F (36.7 C)  Resp 14  SpO2 99%  Physical Exam  Nursing note and vitals reviewed. Constitutional: She is oriented to person, place, and time. She appears well-developed and well-nourished. No distress.  HENT:  Head: Normocephalic and atraumatic.  Eyes: Pupils are equal, round, and reactive to light.  Neck: Normal range of motion.  Cardiovascular: Normal rate and intact distal pulses.   Pulmonary/Chest: No respiratory distress.  Abdominal: Normal appearance. She exhibits no distension.  Musculoskeletal: Normal range of motion.  Neurological: She is alert and oriented to person, place, and time. No cranial nerve deficit.  Neurologic Examination: Mental Status: Alert, oriented, thought content appropriate.  Speech fluent, able to properly enunciate words, speech pattern slow.  Able to follow 3  step commands without difficulty. Poor, inconsistent effort with speech and left body strength. Cranial Nerves: II: visual fields grossly normal, pupils equal, round, reactive to light and accommodation III,IV, VI: ptosis not present, extra-ocular motions intact bilaterally V,VII: smile symmetric, facial diminished sensation on left face VIII: hearing normal bilaterally IX,X: gag reflex present XII: tongue strength normal   Motor: Right :  Upper extremity   5/5                                       Left:     Upper extremity   0/5             Lower extremity   5/5                                                  Lower extremity   0/5 Poor effort on patient to move left side but does compensate to movement of right extremity, leg. Poor effort. Sensory: Pinprick sensation diminished on left body Plantars: Right: downgoing                                Left: downgoing Cerebellar: gait not tested due to patient safety concerns.   Skin: Skin is warm and dry. No rash noted.  Psychiatric: She has a normal mood and affect. Her behavior is normal.    ED Course  Procedures (including critical care time)  Date: 10/28/2012  Rate: 95   Rhythm: normal sinus rhythm  QRS Axis: normal  Intervals: normal  ST/T Wave abnormalities: normal  Conduction Disutrbances: none  Narrative Interpretation: unremarkable      Labs Reviewed  COMPREHENSIVE METABOLIC PANEL - Abnormal; Notable for the following:    Glucose, Bld 104 (*)    Total Bilirubin 0.1 (*)    All other components within normal limits  POCT I-STAT, CHEM 8 - Abnormal; Notable for the following:    Glucose, Bld 108 (*)    All other components within normal limits  ETHANOL  PROTIME-INR  APTT  CBC  DIFFERENTIAL  TROPONIN I  GLUCOSE, CAPILLARY  URINE RAPID DRUG SCREEN (HOSP PERFORMED)  URINALYSIS, ROUTINE W REFLEX MICROSCOPIC  POCT I-STAT TROPONIN I   Ct Head Wo Contrast  10/28/2012  *RADIOLOGY REPORT*  Clinical Data:  Code stroke.   Left facial droop.  Slurred speech.  CT HEAD WITHOUT CONTRAST  Technique:  Contiguous axial images were obtained from the base of the skull through the vertex without contrast  Comparison:  06/02/2012.  Findings:  The brain has a normal appearance without evidence for hemorrhage, acute infarction, hydrocephalus, or mass lesion.  There is no extra axial fluid collection.  The skull and paranasal sinuses are normal. No change from prior normal MRI 06/02/2012.  IMPRESSION: Normal CT of the head without contrast.  Critical Value/emergent results were called by telephone at the time of interpretation on 10/28/2012 at 3:28 p.m. to Dr. Roseanne Reno, who verbally acknowledged these results.   Original Report Authenticated By: Davonna Belling, M.D.      1. Complicated migraine       MDM   Neurology saw the patient evaluated in emergency department recommended observation admission.       Nelia Shi, MD 10/28/12 [phone]

## 2012-10-28 NOTE — ED Notes (Signed)
Stroke team at the bedside

## 2012-10-28 NOTE — Consult Note (Signed)
Referring Physician: Dr. Radford Pax    Chief Complaint: left sided numbness  HPI: Wanda [name] is an 47 y.o. female who woke up 10/28/2012 with left facial tingling. She went to her primary physician and at 1430 she developed decreased sensation of left body. EMS was called.  She has chronic headaches, including now. She says that she takes triptans for migraines but has not had any within 48 hours. She did receive an injection of botox last week which she says has not helped yet. She see's neurology on a regular basis she says: Dr. Pearlean Brownie.   Date last known well: 10/27/2012 Time last known well: Unable to determine  tPA Given: No: out of window  Past Medical History  Diagnosis Date  . Depression   . Fatigue   . Chronic headache   . TMJ pain dysfunction syndrome   . Obesity   . Sinusitis   . Hypertension   . Hypercholesterolemia   . Transient ischemic attack (TIA)     Past Surgical History  Procedure Laterality Date  . Back surgery      x2  . Torn disk l4 l5 s1      surgery by [facility] 9/11 +10/11  . Nasal sinus surgery    . Vaginal hysterectomy      for DUB in 10/2003  . Cholecystectomy      11/19/09  . Gallbladder surgery    . Knee arthroscopy  02/2000    Family History  Problem Relation Age of Onset  . Cervical cancer Maternal Grandmother   . Cancer Maternal Grandmother     mouth  . Hypertension Brother   . Heart failure Brother    Social History:  reports that she quit smoking about 9 years ago. Her smoking use included Cigarettes. She smoked 0.00 packs per day for 20 years. She does not have any smokeless tobacco history on file. She reports that she does not drink alcohol or use illicit drugs.  Allergies:  Allergies  Allergen Reactions  . Oxycodone-Acetaminophen Itching    Patient states she is allergic to the inactive ingredient  . Oxycodone-Acetaminophen Hives    Patient states she is allergic to the inactive ingredients  . Tylox  (Oxycodone-Acetaminophen) Hives and Rash    No current facility-administered medications for this encounter.   Current Outpatient Prescriptions  Medication Sig Dispense Refill  . ALPRAZolam (XANAX) 0.5 MG tablet Take 0.5 mg by mouth at bedtime as needed for sleep.      Marland Kitchen aspirin 325 MG tablet Take 325 mg by mouth daily.      . divalproex (DEPAKOTE) 500 MG DR tablet Take 500 mg by mouth 2 (two) times daily.      Marland Kitchen eletriptan (RELPAX) 40 MG tablet One tablet by mouth at onset of headache. May repeat in 2 hours if headache persists or recurs. may repeat in 2 hours if necessary      . Ketorolac Tromethamine (TORADOL ORAL PO) Take 15 mg by mouth daily.      Marland Kitchen lisinopril (PRINIVIL,ZESTRIL) 5 MG tablet Take 5 mg by mouth daily.      . metroNIDAZOLE (METROGEL) 0.75 % vaginal gel Place 1 Applicatorful vaginally 2 (two) times daily.      . promethazine (PHENERGAN) 25 MG tablet Take 25 mg by mouth every 6 (six) hours as needed for nausea.      . rosuvastatin (CRESTOR) 10 MG tablet Take 10 mg by mouth daily.      . sertraline (ZOLOFT) 100 MG  tablet Take 100 mg by mouth daily.      Marland Kitchen tiZANidine (ZANAFLEX) 2 MG tablet Take 2 mg by mouth 2 (two) times daily.      . traMADol (ULTRAM) 50 MG tablet Take 50 mg by mouth every 6 (six) hours as needed. For pain      . vitamin C (ASCORBIC ACID) 500 MG tablet Take 1,000 mg by mouth daily.       ROS: History obtained from the patient  General ROS: negative for - chills, fatigue, fever, night sweats, weight gain or weight loss Psychological ROS: negative for - behavioral disorder, hallucinations, memory difficulties, mood swings or suicidal ideation Ophthalmic ROS: negative for - blurry vision, double vision, eye pain or loss of vision ENT ROS: negative for - epistaxis, nasal discharge, oral lesions, sore throat, tinnitus or vertigo Allergy and Immunology ROS: negative for - hives or itchy/watery eyes Hematological and Lymphatic ROS: negative for - bleeding  problems, bruising or swollen lymph nodes Endocrine ROS: negative for - galactorrhea, hair pattern changes, polydipsia/polyuria or temperature intolerance Respiratory ROS: negative for - cough, hemoptysis, shortness of breath or wheezing Cardiovascular ROS: negative for - chest pain, dyspnea on exertion, edema or irregular heartbeat Gastrointestinal ROS: negative for - abdominal pain, diarrhea, hematemesis, nausea/vomiting or stool incontinence Genito-Urinary ROS: negative for - dysuria, hematuria, incontinence or urinary frequency/urgency Musculoskeletal ROS: negative for - joint swelling or muscular weakness Neurological ROS: as noted in HPI Dermatological ROS: negative for rash and skin lesion changes   Physical Examination: Blood pressure 149/77, pulse 89, resp. rate 16, SpO2 99.00%.  Neurologic Examination: Mental Status: Alert, oriented, thought content appropriate.  Speech fluent, able to properly enunciate words, speech pattern slow.  Able to follow 3 step commands without difficulty. Poor, inconsistent effort with speech and left body strength. Cranial Nerves: II: visual fields grossly normal, pupils equal, round, reactive to light and accommodation III,IV, VI: ptosis not present, extra-ocular motions intact bilaterally V,VII: smile symmetric, facial diminished sensation on left face VIII: hearing normal bilaterally IX,X: gag reflex present XII: tongue strength normal  Motor: Right : Upper extremity   5/5    Left:     Upper extremity   0/5  Lower extremity   5/5     Lower extremity   0/5 Poor effort on patient to move left side but does compensate to movement of right extremity, leg. Poor effort. Sensory: Pinprick sensation diminished on left body Plantars: Right: downgoing   Left: downgoing Cerebellar: gait not tested due to patient safety concerns.    Results for orders placed during the hospital encounter of 10/28/12 (from the past 48 hour(s))  CBC     Status: None    Collection Time    10/28/12  3:12 PM      Result Value Range   WBC 7.2  4.0 - 10.5 K/uL   RBC 4.30  3.87 - 5.11 MIL/uL   Hemoglobin 12.8  12.0 - 15.0 g/dL   HCT 16.1  09.6 - 04.5 %   MCV 87.9  78.0 - 100.0 fL   MCH 29.8  26.0 - 34.0 pg   MCHC 33.9  30.0 - 36.0 g/dL   RDW 40.9  81.1 - 91.4 %   Platelets 189  150 - 400 K/uL  DIFFERENTIAL     Status: None   Collection Time    10/28/12  3:12 PM      Result Value Range   Neutrophils Relative 54  43 - 77 %  Neutro Abs 3.9  1.7 - 7.7 K/uL   Lymphocytes Relative 37  12 - 46 %   Lymphs Abs 2.6  0.7 - 4.0 K/uL   Monocytes Relative 8  3 - 12 %   Monocytes Absolute 0.6  0.1 - 1.0 K/uL   Eosinophils Relative 1  0 - 5 %   Eosinophils Absolute 0.1  0.0 - 0.7 K/uL   Basophils Relative 0  0 - 1 %   Basophils Absolute 0.0  0.0 - 0.1 K/uL  POCT I-STAT TROPONIN I     Status: None   Collection Time    10/28/12  3:30 PM      Result Value Range   Troponin i, poc 0.01  0.00 - 0.08 ng/mL   Comment 3            Comment: Due to the release kinetics of cTnI,     a negative result within the first hours     of the onset of symptoms does not rule out     myocardial infarction with certainty.     If myocardial infarction is still suspected,     repeat the test at appropriate intervals.  GLUCOSE, CAPILLARY     Status: None   Collection Time    10/28/12  3:32 PM      Result Value Range   Glucose-Capillary 98  70 - 99 mg/dL  POCT I-STAT, CHEM 8     Status: Abnormal   Collection Time    10/28/12  3:32 PM      Result Value Range   Sodium 143  135 - 145 mEq/L   Potassium 3.5  3.5 - 5.1 mEq/L   Chloride 105  96 - 112 mEq/L   BUN 11  6 - 23 mg/dL   Creatinine, Ser 9.81  0.50 - 1.10 mg/dL   Glucose, Bld 191 (*) 70 - 99 mg/dL   Calcium, Ion 4.78  2.95 - 1.23 mmol/L   TCO2 26  0 - 100 mmol/L   Hemoglobin 13.6  12.0 - 15.0 g/dL   HCT 62.1  30.8 - 65.7 %   Ct Head Wo Contrast  10/28/2012  *RADIOLOGY REPORT*  Clinical Data:  Code stroke.  Left facial  droop.  Slurred speech.  CT HEAD WITHOUT CONTRAST  Technique:  Contiguous axial images were obtained from the base of the skull through the vertex without contrast  Comparison:  06/02/2012.  Findings:  The brain has a normal appearance without evidence for hemorrhage, acute infarction, hydrocephalus, or mass lesion.  There is no extra axial fluid collection.  The skull and paranasal sinuses are normal. No change from prior normal MRI 06/02/2012.  IMPRESSION: Normal CT of the head without contrast.  Critical Value/emergent results were called by telephone at the time of interpretation on 10/28/2012 at 3:28 p.m. to Dr. Roseanne Reno, who verbally acknowledged these results.   Original Report Authenticated By: Davonna Belling, M.D.     Assessment: 47 y.o. female with progressive left hemi-sensory loss upon awakening today. Now with decreased left sided movement. CT results non-acute. Suspect complicated migraine as she has experienced in the past. MRI ordered for complete workup.   Gwendolyn Lima. Manson Passey, [facility], MBA, MHA [facility] Pager: [phone] 10/28/2012 3:57 PM   I personally participate in this patient's evaluation and management including subclinical assessment and management recommendations.  Venetia Maxon M.D. [facility] [phone]   10/28/2012, 3:44 PM

## 2012-10-28 NOTE — H&P (Signed)
[facility] History and Physical  Wanda [name] EAV:[id number] DOB: 1966/01/01 DOA: 10/28/2012  Referring physician: Dr. Radford Pax PCP: Cain Saupe, MD  Specialists: Neurology-Dr. Roseanne Reno  Chief Complaint: Headache with left-sided weakness  HPI: Wanda [name] is a 47 y.o. female with past medical history as listed below presents with complaints of left sided headache that began when she woke up this morning. She states that at that time it was just a slight headache, but that she also began to have tingling on the right side of her face and as the day went on the headache worsened and the tingling progressed down her left upper arm and lower extremity. She went to see her PCP, and she was having decreased sensation in the left side as well and so EMS was called. She states that she still is having the headache which she describes as sharp and associated with photophobia. She reports that she's had no associated nausea of vomiting this time. Was seen in the ED and a CT scan of the head was done and came back negative. Neurology was consulted and saw patient and the impression was that patient has a complicated migraine, and admission for observation to the hospitalist service, with an MRI for completeness recommended.   Review of Systems: The patient denies anorexia, fever, weight loss,, vision loss, decreased hearing, hoarseness, chest pain, syncope, dyspnea on exertion, peripheral edema, balance deficits, hemoptysis, abdominal pain, melena, hematochezia, severe indigestion/heartburn, hematuria, incontinence, muscle weakness, suspicious skin lesions, transient blindness, difficulty walking, depression, unusual weight change, abnormal bleeding, enlarged lymph nodes, angioedema. Past Medical History  Diagnosis Date  . Depression   . Fatigue   . Chronic headache   . TMJ pain dysfunction syndrome   . Obesity   . Sinusitis   . Hypertension   . Hypercholesterolemia   . Transient  ischemic attack (TIA)    Past Surgical History  Procedure Laterality Date  . Back surgery      x2  . Torn disk l4 l5 s1      surgery by [facility] 9/11 +10/11  . Nasal sinus surgery    . Vaginal hysterectomy      for DUB in 10/2003  . Cholecystectomy      11/19/09  . Gallbladder surgery    . Knee arthroscopy  02/2000   Social History:  reports that she quit smoking about 9 years ago. Her smoking use included Cigarettes. She smoked 0.00 packs per day for 20 years. She does not have any smokeless tobacco history on file. She reports that she does not drink alcohol or use illicit drugs.  where does patient live--home   Allergies  Allergen Reactions  . Oxycodone-Acetaminophen Itching    Patient states she is allergic to the inactive ingredient  . Oxycodone-Acetaminophen Hives    Patient states she is allergic to the inactive ingredients  . Tylox (Oxycodone-Acetaminophen) Hives and Rash    Family History  Problem Relation Age of Onset  . Cervical cancer Maternal Grandmother   . Cancer Maternal Grandmother     mouth  . Hypertension Brother   . Heart failure Brother      Prior to Admission medications   Medication Sig Start Date End Date Taking? Authorizing Provider  aspirin 325 MG tablet Take 325 mg by mouth daily. 10/30/11 10/29/12 Yes Rhetta Mura, MD  clonazePAM (KLONOPIN) 0.5 MG tablet Take 0.5 mg by mouth 2 (two) times daily.   Yes Historical Provider, MD  divalproex (DEPAKOTE) 500 MG DR tablet  Take 500 mg by mouth 2 (two) times daily.   Yes Historical Provider, MD  eletriptan (RELPAX) 40 MG tablet One tablet by mouth at onset of headache. May repeat in 2 hours if headache persists or recurs. may repeat in 2 hours if necessary   Yes Historical Provider, MD  Ketorolac Tromethamine (TORADOL ORAL PO) Take 15 mg by mouth daily.   Yes Historical Provider, MD  lisinopril (PRINIVIL,ZESTRIL) 5 MG tablet Take 5 mg by mouth daily.   Yes Historical Provider, MD  metroNIDAZOLE  (METROGEL) 0.75 % vaginal gel Place 1 Applicatorful vaginally 2 (two) times daily.   Yes Historical Provider, MD  promethazine (PHENERGAN) 25 MG tablet Take 25 mg by mouth every 6 (six) hours as needed for nausea.   Yes Historical Provider, MD  rosuvastatin (CRESTOR) 10 MG tablet Take 10 mg by mouth daily.   Yes Historical Provider, MD  sertraline (ZOLOFT) 100 MG tablet Take 100 mg by mouth daily.   Yes Historical Provider, MD  tiZANidine (ZANAFLEX) 2 MG tablet Take 2 mg by mouth 2 (two) times daily.   Yes Historical Provider, MD  traMADol (ULTRAM) 50 MG tablet Take 50 mg by mouth every 6 (six) hours as needed. For pain 04/10/11  Yes Julio Sicks, NP   Physical Exam: Filed Vitals:   10/28/12 1615 10/28/12 1645 10/28/12 1648 10/28/12 1820  BP: 144/79 157/88    Pulse: 79 81  70  Temp:   98 F (36.7 C) 98.2 F (36.8 C)  TempSrc:    Oral  Resp: 14 6  18   SpO2: 99% 96%  99%    Constitutional: Vital signs reviewed.  Patient is a well-developed and well-nourished  in no acute distress and cooperative with exam. Alert and oriented x3.  Head: Normocephalic and atraumatic Ear: TM normal bilaterally Mouth: no erythema or exudates, MMM Eyes: PERRL, EOMI, conjunctivae normal, No scleral icterus.  Neck: Supple, Trachea midline normal ROM, No JVD, mass, thyromegaly, or carotid bruit present.  Cardiovascular: RRR, S1 normal, S2 normal, no MRG, pulses symmetric and intact bilaterally Pulmonary/Chest: CTAB, no wheezes, rales, or rhonchi Abdominal: Soft. Non-tender, non-distended, bowel sounds are normal, no masses, organomegaly, or guarding present.  GU: no CVA tenderness Extremities: No cyanosis and no edema  Neurological: A&O x3, Strength is normal and symmetric bilaterally, cranial nerve II-XII are grossly intact,strenght on L side is 2-3/5, decreased sensation to light touch L>R Skin: Warm, dry and intact. No rash.  Psychiatric: Normal mood and affect.  Judgment and thought content normal.   Labs on Admission:  Basic Metabolic Panel:  Recent Labs Lab 10/28/12 1512 10/28/12 1532  NA 141 143  K 3.6 3.5  CL 105 105  CO2 26  --   GLUCOSE 104* 108*  BUN 11 11  CREATININE 0.72 0.70  CALCIUM 9.0  --    Liver Function Tests:  Recent Labs Lab 10/28/12 1512  AST 19  ALT 15  ALKPHOS 95  BILITOT 0.1*  PROT 7.1  ALBUMIN 3.8   No results found for this basename: LIPASE, AMYLASE,  in the last 168 hours No results found for this basename: AMMONIA,  in the last 168 hours CBC:  Recent Labs Lab 10/28/12 1512 10/28/12 1532  WBC 7.2  --   NEUTROABS 3.9  --   HGB 12.8 13.6  HCT 37.8 40.0  MCV 87.9  --   PLT 189  --    Cardiac Enzymes:  Recent Labs Lab 10/28/12 1513  TROPONINI <0.30  BNP (last 3 results) No results found for this basename: PROBNP,  in the last 8760 hours CBG:  Recent Labs Lab 10/28/12 1532  GLUCAP 98    Radiological Exams on Admission: Ct Head Wo Contrast  10/28/2012  *RADIOLOGY REPORT*  Clinical Data:  Code stroke.  Left facial droop.  Slurred speech.  CT HEAD WITHOUT CONTRAST  Technique:  Contiguous axial images were obtained from the base of the skull through the vertex without contrast  Comparison:  06/02/2012.  Findings:  The brain has a normal appearance without evidence for hemorrhage, acute infarction, hydrocephalus, or mass lesion.  There is no extra axial fluid collection.  The skull and paranasal sinuses are normal. No change from prior normal MRI 06/02/2012.  IMPRESSION: Normal CT of the head without contrast.  Critical Value/emergent results were called by telephone at the time of interpretation on 10/28/2012 at 3:28 p.m. to Dr. Roseanne Reno, who verbally acknowledged these results.   Original Report Authenticated By: Davonna Belling, M.D.    Mr Brain Wo Contrast  10/28/2012  *RADIOLOGY REPORT*  Clinical Data: High blood pressure and hypercholesterolemia. Presenting with left facial droop and slurred speech. History of chronic headaches.   MRI HEAD WITHOUT CONTRAST  Technique:  Multiplanar, multiecho pulse sequences of the brain and surrounding structures were obtained according to standard protocol without intravenous contrast.  Comparison: 10/28/2012 CT.  06/02/2012 MR.  Findings: No acute infarct.  No intracranial hemorrhage.  No intracranial mass lesion detected on this unenhanced exam.  No evidence of white matter abnormality to suggest demyelinating process.  Mild exophthalmos.  Major intracranial vascular structures are patent.  Cerebellar tonsils minimally low-lying but within the range normal limits.  Pituitary pineal region unremarkable.  Minimal ethmoid sinus air cell mucosal thickening.  IMPRESSION: No acute infarct.  Critical Value/emergent results were called by telephone at the time of interpretation on 10/28/2012 at 5:55 p.m. to Dr. Radford Pax. , who verbally acknowledged these results.   Original Report Authenticated By: Lacy Duverney, M.D.       Assessment/Plan Active Problems: Complicated migraine /Weakness of left side of body -AS discussed above, CT head neg -seen by by neuro and MRI brain ordered>>follow -discussed headache management wiith Dr Roseanne Reno and he recommends adding prn IV dilaudid and robaxin to her home meds-po toradol,depakote, and replax  DEPRESSION -continue outpt meds  Hypertension -continue lisinopril  Code Status: full Family Communication: mother at bedside Disposition Plan: admit to tele  Time spent: >49mins  Kela Millin [facility] Pager [phone]  If 7PM-7AM, please contact night-coverage [email or web address] Password [facility] 10/28/2012, 6:25 PM

## 2012-10-28 NOTE — ED Notes (Signed)
Pt returned from MRI. Admitting MD at the bedside.

## 2012-10-28 NOTE — ED Notes (Signed)
Pt to department via EMS from PCP office- states she woke up tingling in her face on the left side. Went to PCP office and at 1430 she started having left sided facial droop and weakness in her left arm. Pt A&Ox4. Bp-150/90 Hr-80 CBG-106 20g RAC

## 2012-10-28 NOTE — Code Documentation (Signed)
Code Stroke called at 1501 Patient arrival at 43 EDP exam  1514 Stroke Team arrival 1505 Last know well 4/03 2200 Patient arrival in CT 1515 Phlebotomist arrival 1510 Code Stroke canceled at 1538  Patient awoke this morning with tingling on her left side and HA.  She went to her MD this afternoon, while she was there she suddenly could not move her Left side at 1430.  Patient arrived via EMS at 1512.  Non contrast CT done.  NIHSS 7, decreased sensory on Left.  Patient unable to move left arm or leg.  But, when her left arm is raised above her face it does not fall but drifts above her head to the bed.  When asked to move her left leg there is no movement, but she has good muscle tone on the left when she raises her right leg

## 2012-10-29 DIAGNOSIS — F329 Major depressive disorder, single episode, unspecified: Secondary | ICD-10-CM

## 2012-10-29 DIAGNOSIS — R51 Headache: Secondary | ICD-10-CM

## 2012-10-29 LAB — BASIC METABOLIC PANEL
BUN: 12 mg/dL (ref 6–23)
Creatinine, Ser: 0.74 mg/dL (ref 0.50–1.10)
GFR calc Af Amer: >90 mL/min (ref >90)
GFR calc non Af Amer: >90 mL/min (ref >90)

## 2012-10-29 NOTE — Evaluation (Signed)
Physical Therapy Evaluation Patient Details Name: Wanda [name] MRN: [id number] DOB: [birth year] Today's Date: 10/29/2012 Time: 3244-0102 PT Time Calculation (min): 52 min  PT Assessment / Plan / Recommendation Clinical Impression  Pt presenting with L side dwekaness in UE and LE  with decreased ability to perform functional mobility at an independent level.  Pt to benfit from skilled to to continue to make gains (as she states she is already seeing small gains in strenght in L side) to return home at modified independent level.  Dtr will have to return to school on Monday so pt will need to function safely.  Pt lives in 2nd floor apratment so will have to ascend steps to get home safely. Educated pt on the importance of using the L side (UE and LE) as much as possible in order to continue to make gains in strength.  After session pt could already begin to feel increaed tingling in UE after we had worked in [employer] activities to stimulate.       PT Assessment  Patient needs continued PT services    Follow Up Recommendations  Home health PT;Outpatient PT (depending on how she progresses . can talk to primary about )    Does the patient have the potential to tolerate intense rehabilitation      Barriers to Discharge        Equipment Recommendations  Rolling walker with 5" wheels    Recommendations for Other Services     Frequency Min 4X/week    Precautions / Restrictions     Pertinent Vitals/Pain Denies any pain in body, just headache remains      Mobility  Bed Mobility Bed Mobility: Supine to Sit Supine to Sit: 4: Min guard Details for Bed Mobility Assistance: pt managed independently however compensating for L sided weakness and o use of L UE during supine to sit.  Transfers Transfers: Sit to Stand;Stand to Sit Sit to Stand: 4: Min guard;With upper extremity assist;From elevated surface;From bed;From chair/3-in-1 Stand to Sit: 4: Min guard;With upper extremity assist;To  bed Details for Transfer Assistance: Pt initially did not use LUE, then educated to use LUE as much as possible and able to grab for RW upon 3rd attempt. however not equal use of LE for lift over, all weight shifted to Right side for use of RLE for lift off for tranfer.  With RLE foward pt was unable to rise to sand without min A due to tranfered to use LLE and wekaness in that LLE.  Ambulation/Gait Ambulation/Gait Assistance: 4: Min guard Ambulation Distance (Feet): 18 Feet (to and from bathroom so turning and backing involved) Assistive device: Rolling walker Ambulation/Gait Assistance Details: step to pattern, pt consistently with L  drop foot due to weak DF for foot clearance and decreased control of L quad resulted in left hip posteriro to COG and L knee extesion for support during stance phase.  General Gait Details: pt was able to hold onto RW with L hand, however not weight bearing as much through this extremity.  Modified Rankin (Stroke Patients Only) Pre-Morbid Rankin Score: Moderate disability    Exercises     PT Diagnosis: Difficulty walking;Generalized weakness;Hemiplegia non-dominant side  PT Problem List: Decreased strength;Decreased activity tolerance;Decreased mobility;Decreased knowledge of use of DME PT Treatment Interventions: DME instruction;Gait training;Stair training;Functional mobility training;Therapeutic activities;Therapeutic exercise;Balance training;Neuromuscular re-education;Patient/family education   PT Goals Acute Rehab PT Goals PT Goal Formulation: With patient Time For Goal Achievement: 11/11/12 Potential to Achieve Goals: Good Pt will  go Sit to Supine/Side: with modified independence PT Goal: Sit to Supine/Side - Progress: Goal set today Pt will go Sit to Stand: with modified independence PT Goal: Sit to Stand - Progress: Goal set today Pt will go Stand to Sit: with modified independence PT Goal: Stand to Sit - Progress: Goal set today Pt will Ambulate:  16 - 50 feet;with modified independence;with rolling walker PT Goal: Ambulate - Progress: Goal set today Pt will Go Up / Down Stairs: 6-9 stairs;with supervision;with rail(s) PT Goal: Up/Down Stairs - Progress: Goal set today  Visit Information  Last PT Received On: 10/29/12 Assistance Needed: +1    Subjective Data  Subjective: to feel better   Prior Functioning  Home Living Lives With: Daughter (senior in high school ) Available Help at Discharge: Family;Friend(s) (states has folks who can check on her) Type of Home: Apartment (second floor apartment) Home Access: Stairs to enter Secretary/administrator of Steps: full flight Entrance Stairs-Rails: Development worker, community:  (has 3n1 to place over the [employer]) Home Adaptive Equipment: Bedside commode/3-in-1 Prior Function Level of Independence: Independent Able to Take Stairs?: Reciprically Driving: Yes Vocation: Full time employment (currently on FMLA for depression ) Communication Communication: No difficulties Dominant Hand: Right    Cognition  Cognition Overall Cognitive Status: Appears within functional limits for tasks assessed/performed Arousal/Alertness: Awake/alert Orientation Level: Appears intact for tasks assessed Behavior During Session: [facility] for tasks performed    Extremity/Trunk Assessment Right Upper Extremity Assessment RUE ROM/Strength/Tone: Within functional levels Left Upper Extremity Assessment LUE ROM/Strength/Tone: Deficits LUE ROM/Strength/Tone Deficits: see OT for full assessment: functional with 2/4 grip/hand and entire UE strength Right Lower Extremity Assessment RLE ROM/Strength/Tone: Within functional levels Left Lower Extremity Assessment LLE ROM/Strength/Tone: Deficits LLE ROM/Strength/Tone Deficits: hip and knee flexion/ext functionally 2+/4 , ankle DF/PF assessed at 2-/4 LLE Sensation: Deficits LLE Sensation Deficits: LT intact, but pt states feels heavy and asleep Trunk  Assessment Trunk Assessment: Other exceptions Trunk Exceptions: slight weakness with trunk flexion /ext on left trunk/back area   Balance    End of Session PT - End of Session Equipment Utilized During Treatment: Gait belt Activity Tolerance: Patient tolerated treatment well;Other (comment) (commented on how little movment made her so tired. ) Patient left: in bed;with family/visitor present (dtr) Nurse Communication: Mobility status  GP Functional Assessment Tool Used: clincial judgement Functional Limitation: Mobility: Walking and moving around Mobility: Walking and Moving Around Current Status (B1478): At least 20 percent but less than 40 percent impaired, limited or restricted Mobility: Walking and Moving Around Goal Status [phone]): 0 percent impaired, limited or restricted   Duff Pozzi, [facility] 10/29/2012, 11:07 AM  Marella Bile, PT Pager: [phone] 10/29/2012

## 2012-10-29 NOTE — Discharge Summary (Signed)
Physician Discharge Summary  Wanda DITULLIO OZH:[id number] DOB: [birth year] DOA: 10/28/2012  PCP: Cain Saupe, MD  Admit date: 10/28/2012 Discharge date: 10/29/2012  Time spent: 30 minutes  Recommendations for Outpatient Follow-up:  1. Follow up with  PCP next weeks. 2. Psyq. Appointment on 4.11.2014  Discharge Diagnoses:  Active Problems:   DEPRESSION   Complicated migraine   Disturbance of skin sensation   Weakness of left side of body   Discharge Condition: stable  Diet recommendation: carb modified diet  Filed Weights   10/28/12 1924  Weight: 94.076 kg (207 lb 6.4 [name])    History of present illness:  47 y.o. [name] with past medical history as listed below presents with complaints of left sided headache that began when she woke up this morning. She states that at that time it was just a slight headache, but that she also began to have tingling on the right side of her face and as the day went on the headache worsened and the tingling progressed down her left upper arm and lower extremity. She went to see her PCP, and she was having decreased sensation in the left side as well and so EMS was called. She states that she still is having the headache which she describes as sharp and associated with photophobia. She reports that she's had no associated nausea of vomiting this time. Was seen in the ED and a CT scan of the head was done and came back negative. Neurology was consulted and saw patient and the impression was that patient has a complicated migraine, and admission for observation to the hospitalist service, with an MRI for completeness recommended.   Hospital Course:  Weakness of left side of body/Complicated migraine - MRI 4.4.2014, no acute infarct.  - able to move her extremities unconsciously.   DEPRESSION  - cont meds.   Procedures:  Mri 4.5.2014: negative for a stroke  Consultations:  neurology  Discharge Exam: Filed Vitals:   10/28/12 1820 10/28/12  1908 10/28/12 1924 10/29/12 0422  BP:  147/85  122/83  Pulse: 70 66  68  Temp: 98.2 F (36.8 C) 98.3 F (36.8 C)  98.6 F (37 C)  TempSrc: Oral Oral  Oral  Resp: 18 18  18   Height:  5\' 8"  (1.727 m)    Weight:   94.076 kg (207 lb 6.4 [name])   SpO2: 99% 99%  99%    General: A&O x3 Cardiovascular: RRR Respiratory: good air movement CTA B/L  Discharge Instructions  Discharge Orders   Future Orders Complete By Expires     Diet - low sodium heart healthy  As directed     Increase activity slowly  As directed         Medication List    TAKE these medications       aspirin 325 MG tablet  Take 325 mg by mouth daily.     clonazePAM 0.5 MG tablet  Commonly known as:  KLONOPIN  Take 0.5 mg by mouth 2 (two) times daily.     divalproex 500 MG DR tablet  Commonly known as:  DEPAKOTE  Take 500 mg by mouth 2 (two) times daily.     eletriptan 40 MG tablet  Commonly known as:  RELPAX  One tablet by mouth at onset of headache. May repeat in 2 hours if headache persists or recurs. may repeat in 2 hours if necessary     lisinopril 5 MG tablet  Commonly known as:  PRINIVIL,ZESTRIL  Take 5  mg by mouth daily.     metroNIDAZOLE 0.75 % vaginal gel  Commonly known as:  METROGEL  Place 1 Applicatorful vaginally 2 (two) times daily.     promethazine 25 MG tablet  Commonly known as:  PHENERGAN  Take 25 mg by mouth every 6 (six) hours as needed for nausea.     rosuvastatin 10 MG tablet  Commonly known as:  CRESTOR  Take 10 mg by mouth daily.     sertraline 100 MG tablet  Commonly known as:  ZOLOFT  Take 100 mg by mouth daily.     tiZANidine 2 MG tablet  Commonly known as:  ZANAFLEX  Take 2 mg by mouth 2 (two) times daily.     TORADOL ORAL PO  Take 15 mg by mouth daily.     traMADol 50 MG tablet  Commonly known as:  ULTRAM  Take 50 mg by mouth every 6 (six) hours as needed. For pain          The results of significant diagnostics from this hospitalization (including  imaging, microbiology, ancillary and laboratory) are listed below for reference.    Significant Diagnostic Studies: Ct Head Wo Contrast  10/28/2012  *RADIOLOGY REPORT*  Clinical Data:  Code stroke.  Left facial droop.  Slurred speech.  CT HEAD WITHOUT CONTRAST  Technique:  Contiguous axial images were obtained from the base of the skull through the vertex without contrast  Comparison:  06/02/2012.  Findings:  The brain has a normal appearance without evidence for hemorrhage, acute infarction, hydrocephalus, or mass lesion.  There is no extra axial fluid collection.  The skull and paranasal sinuses are normal. No change from prior normal MRI 06/02/2012.  IMPRESSION: Normal CT of the head without contrast.  Critical Value/emergent results were called by telephone at the time of interpretation on 10/28/2012 at 3:28 p.m. to Dr. Roseanne Reno, who verbally acknowledged these results.   Original Report Authenticated By: Davonna Belling, M.D.    Mr Brain Wo Contrast  10/28/2012  *RADIOLOGY REPORT*  Clinical Data: High blood pressure and hypercholesterolemia. Presenting with left facial droop and slurred speech. History of chronic headaches.  MRI HEAD WITHOUT CONTRAST  Technique:  Multiplanar, multiecho pulse sequences of the brain and surrounding structures were obtained according to standard protocol without intravenous contrast.  Comparison: 10/28/2012 CT.  06/02/2012 MR.  Findings: No acute infarct.  No intracranial hemorrhage.  No intracranial mass lesion detected on this unenhanced exam.  No evidence of white matter abnormality to suggest demyelinating process.  Mild exophthalmos.  Major intracranial vascular structures are patent.  Cerebellar tonsils minimally low-lying but within the range normal limits.  Pituitary pineal region unremarkable.  Minimal ethmoid sinus air cell mucosal thickening.  IMPRESSION: No acute infarct.  Critical Value/emergent results were called by telephone at the time of interpretation on  10/28/2012 at 5:55 p.m. to Dr. Radford Pax. , who verbally acknowledged these results.   Original Report Authenticated By: Lacy Duverney, M.D.     Microbiology: No results found for this or any previous visit (from the past 240 hour(s)).   Labs: Basic Metabolic Panel:  Recent Labs Lab 10/28/12 1512 10/28/12 1532 10/28/12 1952 10/29/12 0454  NA 141 143  --  140  K 3.6 3.5  --  3.8  CL 105 105  --  105  CO2 26  --   --  28  GLUCOSE 104* 108*  --  87  BUN 11 11  --  12  CREATININE 0.72 0.70 0.64 0.74  CALCIUM 9.0  --   --  8.6   Liver Function Tests:  Recent Labs Lab 10/28/12 1512  AST 19  ALT 15  ALKPHOS 95  BILITOT 0.1*  PROT 7.1  ALBUMIN 3.8   No results found for this basename: LIPASE, AMYLASE,  in the last 168 hours No results found for this basename: AMMONIA,  in the last 168 hours CBC:  Recent Labs Lab 10/28/12 1512 10/28/12 1532 10/28/12 1952  WBC 7.2  --  6.7  NEUTROABS 3.9  --   --   HGB 12.8 13.6 11.8*  HCT 37.8 40.0 35.1*  MCV 87.9  --  87.8  PLT 189  --  166   Cardiac Enzymes:  Recent Labs Lab 10/28/12 1513  TROPONINI <0.30   BNP: BNP (last 3 results) No results found for this basename: PROBNP,  in the last 8760 hours CBG:  Recent Labs Lab 10/28/12 1532 10/29/12 0802  GLUCAP 98 93       Signed:  FELIZ ORTIZ, ABRAHAM  [facility] 10/29/2012, 8:47 AM

## 2012-10-29 NOTE — Evaluation (Signed)
Occupational Therapy Evaluation Patient Details Name: Wanda [name] MRN: [id number] DOB: 1965/11/13 Today's Date: 10/29/2012 Time: 1308-6578 OT Time Calculation (min): 28 min  OT Assessment / Plan / Recommendation Clinical Impression  This 47 yo female admitted with complex migraines presents to acute OT with problems below. WIll benefit from acute OT with follow up HHOT.    OT Assessment  Patient needs continued OT Services    Follow Up Recommendations  Home health OT    Barriers to Discharge None    Equipment Recommendations  None recommended by OT       Frequency  Min 2X/week    Precautions / Restrictions Precautions Precautions: Fall Restrictions Weight Bearing Restrictions: No       ADL  Eating/Feeding: Set up;Simulated Where Assessed - Eating/Feeding: Edge of bed Grooming: Set up;Simulated Where Assessed - Grooming: Unsupported sitting Upper Body Bathing: Minimal assistance;Simulated Where Assessed - Upper Body Bathing: Unsupported sitting Lower Body Bathing: Minimal assistance;Simulated Where Assessed - Lower Body Bathing: Unsupported sit to stand Upper Body Dressing: Minimal assistance Where Assessed - Upper Body Dressing: Unsupported sitting Lower Body Dressing: Simulated;Set up;Supervision/safety Where Assessed - Lower Body Dressing: Unsupported sit to stand ADL Comments: All BADLs with increased time due to decreased/slow use of LUE    OT Diagnosis: Hemiplegia dominant side;Generalized weakness  OT Problem List: Decreased strength;Decreased range of motion;Decreased activity tolerance;Impaired sensation;Decreased coordination;Impaired UE functional use OT Treatment Interventions: Self-care/ADL training;Patient/family education;Therapeutic exercise   OT Goals Acute Rehab OT Goals OT Goal Formulation: With patient Time For Goal Achievement: 11/05/12 Potential to Achieve Goals: Good ADL Goals Pt Will Transfer to Toilet: with modified  independence;Ambulation;with DME;3-in-1 ADL Goal: Toilet Transfer - Progress: Goal set today Pt Will Perform Toileting - Clothing Manipulation: with modified independence;Standing ADL Goal: Toileting - Clothing Manipulation - Progress: Goal set today Pt Will Perform Toileting - Hygiene: with modified independence;Sit to stand from 3-in-1/toilet ADL Goal: Toileting - Hygiene - Progress: Goal set today Arm Goals Pt Will Perform AROM: Independently;Left upper extremity;10 reps;to maintain range of motion;1 set (Supine-shoulder flexion and abduction) Arm Goal: AROM - Progress: Goal set today Additional Arm Goal #1: Pt will be I with activites on FM handouts Arm Goal: Additional Goal #1 - Progress: Goal set today  Visit Information  Last OT Received On: 10/29/12 Assistance Needed: +2    Subjective Data  Subjective: I just don't feel like I am ready to go home, everything I do just wears me out since I have to think through so much with moving my left arm and leg   Prior Functioning     Home Living Lives With: Daughter Available Help at Discharge: Family;Friend(s);Available 24 hours/day Type of Home: Apartment Home Access: Stairs to enter [employer] of Steps: full flight Entrance Stairs-Rails: Right;Left Bathroom Shower/Tub: Forensic scientist: Standard Home Adaptive Equipment: Bedside commode/3-in-1;Shower chair with back Prior Function Level of Independence: Independent Able to Take Stairs?: Reciprically Driving: Yes Vocation: Full time employment Comments: Currently on FMLA for depression, 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 Musician:  (speech is slow) Dominant Hand: Right         Vision/Perception Vision - History Baseline Vision: Wears glasses only for reading Patient Visual Report:  (reports blurriness with migraines) Vision - Assessment Vision Assessment: Vision not tested Ocular Range of Motion: Within  Functional Limits Alignment/Gaze Preference: Within Defined Limits Tracking/Visual Pursuits: Able to track stimulus in all quads without difficulty Saccades: Within functional limits Convergence: Within functional limits Visual Fields: No  apparent deficits   Cognition  Cognition Overall Cognitive Status: Appears within functional limits for tasks assessed/performed Arousal/Alertness: Awake/alert Orientation Level: Appears intact for tasks assessed Behavior During Session: [facility] for tasks performed    Extremity/Trunk Assessment Right Upper Extremity Assessment RUE ROM/Strength/Tone: Within functional levels Left Upper Extremity Assessment LUE ROM/Strength/Tone: Deficits LUE ROM/Strength/Tone Deficits: Brunstrom 3 in hand, 2 in rest of arm. PROM WNL.  LUE Sensation: Deficits LUE Sensation Deficits: light touch she can feel but reports that it does not feel the same as the right UE LUE Coordination: Deficits LUE Coordination Deficits: Both FM and GM are affected. Increased time to try to do all GM and FM activities     Mobility Bed Mobility Bed Mobility: Supine to Sit;Sit to Supine Supine to Sit: 6: Modified independent (Device/Increase time);With rails;HOB elevated (20 degrees) Sit to Supine: 6: Modified independent (Device/Increase time);With rail;HOB elevated (20 degrees) Details for Bed Mobility Assistance: Pt moved LLE off bed by placing right LE under it.              End of Session OT - End of Session Activity Tolerance:  (frustrated with how LUE is not working) Patient left: in bed;with family/visitor present;with call bell/phone within reach Nurse Communication:  (Feel pt could benefit from one more day)  GO Functional Assessment Tool Used: Clinical observation Functional Limitation: Self care Self Care Current Status (R6045): At least 1 percent but less than 20 percent impaired, limited or restricted Self Care Goal Status (W0981): At least 1 percent but less than 20  percent impaired, limited or restricted   Evette Georges [phone] 10/29/2012, 5:10 PM

## 2012-10-29 NOTE — Progress Notes (Signed)
Physical Therapy Treatment Patient Details Name: LASHARN BUFKIN MRN: [id number] DOB: Apr 17, 1966 Today's Date: 10/29/2012 Time: 4098-1191 PT Time Calculation (min): 32 min  PT Assessment / Plan / Recommendation Comments on Treatment Session  Pt s/p ?TIA and Migraine with decr mobility secondary to LUE and LLE weakness.  Weakness improved since earlier am sesssion with PT.  Pt was able to go up and down 4 steps with PT using rail on left.  Pt did not have any LOB and was not dragging LLE.  Pt became tearful while on steps stating she was not ready to go home today.  Reported information to nursing verbally and to MD via text through Amion.  Feel pt should get RW and HHPT at d/c.      Follow Up Recommendations  Home health PT                 Equipment Recommendations  Rolling walker with 5" wheels        Frequency Min 4X/week   Plan Discharge plan remains appropriate;Frequency remains appropriate    Precautions / Restrictions Precautions Precautions: Fall Restrictions Weight Bearing Restrictions: No   Pertinent Vitals/Pain VSS, No pain    Mobility  Bed Mobility Bed Mobility: Supine to Sit;Sit to Supine Supine to Sit: 6: Modified independent (Device/Increase time);With rails;HOB elevated (20 degrees) Sit to Supine: 6: Modified independent (Device/Increase time);With rail;HOB elevated (20 degrees) Details for Bed Mobility Assistance: Pt moved LLE off bed by placing right LE under it.   Transfers Transfers: Sit to Stand;Stand to Sit Sit to Stand: 5: Supervision;With upper extremity assist;From bed Stand to Sit: 5: Supervision;To bed;With upper extremity assist Details for Transfer Assistance: Pt using both LUE and LLE functionally.  Needed cues for hand placement to not pull up on RW.   Ambulation/Gait Ambulation/Gait Assistance: 4: Min guard Ambulation Distance (Feet): 90 Feet Assistive device: Rolling walker Ambulation/Gait Assistance Details: Pt with step to pattern,  pt able to pick up LLE and not dragging foot as in previous session earlier today.  Pt also gripping RW fine with LUE.  Pt moving very slowly.  Took her incr time to walk to stairwell and back.   Gait Pattern: Step-to pattern;Decreased stride length Stairs: Yes Stairs Assistance: 5: Supervision Stairs Assistance Details (indicate cue type and reason): Pt held rail and went up sideways going up with right LE first and coming down with left LE first.  gripping rail and using it to stabilize.  No significant weakness noted but pt began crying while performing, saying she cannot go home yet.  Notified nursing of patient's fear to go home.   Stair Management Technique: One rail Left;Sideways Number of Stairs: 4 Wheelchair Mobility Wheelchair Mobility: No Modified Rankin (Stroke Patients Only) Pre-Morbid Rankin Score: No symptoms Modified Rankin: Slight disability    PT Goals Acute Rehab PT Goals PT Goal: Sit to Supine/Side - Progress: Progressing toward goal PT Goal: Sit to Stand - Progress: Progressing toward goal PT Goal: Stand to Sit - Progress: Progressing toward goal PT Goal: Ambulate - Progress: Progressing toward goal PT Goal: Up/Down Stairs - Progress: Progressing toward goal  Visit Information  Last PT Received On: 10/29/12 Assistance Needed: +2    Subjective Data  Subjective: "I can't go home today." Patient Stated Goal: To go home tomorrow   Cognition  Cognition Overall Cognitive Status: Appears within functional limits for tasks assessed/performed Arousal/Alertness: Awake/alert Orientation Level: Appears intact for tasks assessed Behavior During Session: [facility] for tasks performed  Balance  Static Standing Balance Static Standing - Balance Support: Bilateral upper extremity supported;During functional activity Static Standing - Level of Assistance: 5: Stand by assistance Static Standing - Comment/# of Minutes: Can stand statically with RW without assist.  High Level  Balance High Level Balance Activites: Turns;Sudden stops High Level Balance Comments: Can perform above with RW without LOB.  End of Session PT - End of Session Equipment Utilized During Treatment: Gait belt Activity Tolerance: Patient tolerated treatment well;Other (comment) Patient left: in bed;with call bell/phone within reach Nurse Communication: Mobility status        INGOLD,Nahla Lukin 10/29/2012, 5:07 PM [facility] Acute Rehabilitation [phone] (pager)

## 2012-10-29 NOTE — Progress Notes (Signed)
PT Note  Patient Details Name: Wanda [name] MRN: [id number] DOB: May 19, 1966   Stopped by at 1:45 to check on patient again to see if she is progressing with her mobility.  She was eating lunch and  had a room full of visitors.  We will try ot check back on pt as time permits.  Thanks   Marella Bile 10/29/2012, 2:11 PM

## 2012-10-29 NOTE — Progress Notes (Signed)
Utilization Review Completed.   Maddelynn Guilianna Mckoy, RN, BSN Nurse Case Manager  [phone]  

## 2012-10-29 NOTE — Progress Notes (Signed)
[facility] PROGRESS NOTE Assessment/Plan:  Weakness of left side of body/Complicated migraine/ Panic attacks - MRI 4.4.2014, no acute infarct. - able to move her extremities unconsciously.    DEPRESSION - cont meds.    Code Status: full Family Communication: none  Disposition Plan: home.   Consultants:  neuro  Procedures:  MRI  Antibiotics:  None  HPI/Subjective: Feels that her left side is weak but improved from yesterday.  Objective: Filed Vitals:   10/28/12 1820 10/28/12 1908 10/28/12 1924 10/29/12 0422  BP:  147/85  122/83  Pulse: 70 66  68  Temp: 98.2 F (36.8 C) 98.3 F (36.8 C)  98.6 F (37 C)  TempSrc: Oral Oral  Oral  Resp: 18 18  18   Height:  5\' 8"  (1.727 m)    Weight:   94.076 kg (207 lb 6.4 oz)   SpO2: 99% 99%  99%   No intake or output data in the 24 hours ending 10/29/12 0825 Filed Weights   10/28/12 1924  Weight: 94.076 kg (207 lb 6.4 oz)    Exam:  General: Alert, awake, oriented x3, in no acute distress.  HEENT: No bruits, no goiter.  Heart: Regular rate and rhythm, without murmurs, rubs, gallops.  Lungs: Good air movement, bilateral air movement.  Abdomen: Soft, nontender, nondistended, positive bowel sounds.  Neuro: 4/3 muscle straighten on the left side. Move her left side unconsciously    Data Reviewed: Basic Metabolic Panel:  Recent Labs Lab 10/28/12 1512 10/28/12 1532 10/28/12 1952 10/29/12 0454  NA 141 143  --  140  K 3.6 3.5  --  3.8  CL 105 105  --  105  CO2 26  --   --  28  GLUCOSE 104* 108*  --  87  BUN 11 11  --  12  CREATININE 0.72 0.70 0.64 0.74  CALCIUM 9.0  --   --  8.6   Liver Function Tests:  Recent Labs Lab 10/28/12 1512  AST 19  ALT 15  ALKPHOS 95  BILITOT 0.1*  PROT 7.1  ALBUMIN 3.8   No results found for this basename: LIPASE, AMYLASE,  in the last 168 hours No results found for this basename: AMMONIA,  in the last 168 hours CBC:  Recent Labs Lab 10/28/12 1512  10/28/12 1532 10/28/12 1952  WBC 7.2  --  6.7  NEUTROABS 3.9  --   --   HGB 12.8 13.6 11.8*  HCT 37.8 40.0 35.1*  MCV 87.9  --  87.8  PLT 189  --  166   Cardiac Enzymes:  Recent Labs Lab 10/28/12 1513  TROPONINI <0.30   BNP (last 3 results) No results found for this basename: PROBNP,  in the last 8760 hours CBG:  Recent Labs Lab 10/28/12 1532 10/29/12 0802  GLUCAP 98 93    No results found for this or any previous visit (from the past 240 hour(s)).   Studies: Ct Head Wo Contrast  10/28/2012  *RADIOLOGY REPORT*  Clinical Data:  Code stroke.  Left facial droop.  Slurred speech.  CT HEAD WITHOUT CONTRAST  Technique:  Contiguous axial images were obtained from the base of the skull through the vertex without contrast  Comparison:  06/02/2012.  Findings:  The brain has a normal appearance without evidence for hemorrhage, acute infarction, hydrocephalus, or mass lesion.  There is no extra axial fluid collection.  The skull and paranasal sinuses are normal. No change from prior normal MRI 06/02/2012.  IMPRESSION: Normal CT of the  head without contrast.  Critical Value/emergent results were called by telephone at the time of interpretation on 10/28/2012 at 3:28 p.m. to Dr. Roseanne Reno, who verbally acknowledged these results.   Original Report Authenticated By: Davonna Belling, M.D.    Mr Brain Wo Contrast  10/28/2012  *RADIOLOGY REPORT*  Clinical Data: High blood pressure and hypercholesterolemia. Presenting with left facial droop and slurred speech. History of chronic headaches.  MRI HEAD WITHOUT CONTRAST  Technique:  Multiplanar, multiecho pulse sequences of the brain and surrounding structures were obtained according to standard protocol without intravenous contrast.  Comparison: 10/28/2012 CT.  06/02/2012 MR.  Findings: No acute infarct.  No intracranial hemorrhage.  No intracranial mass lesion detected on this unenhanced exam.  No evidence of white matter abnormality to suggest demyelinating  process.  Mild exophthalmos.  Major intracranial vascular structures are patent.  Cerebellar tonsils minimally low-lying but within the range normal limits.  Pituitary pineal region unremarkable.  Minimal ethmoid sinus air cell mucosal thickening.  IMPRESSION: No acute infarct.  Critical Value/emergent results were called by telephone at the time of interpretation on 10/28/2012 at 5:55 p.m. to Dr. Radford Pax. , who verbally acknowledged these results.   Original Report Authenticated By: Lacy Duverney, M.D.     Scheduled Meds: . aspirin  325 mg Oral Daily  . atorvastatin  20 mg Oral q1800  . clonazePAM  0.5 mg Oral BID  . divalproex  500 mg Oral BID  . enoxaparin (LOVENOX) injection  40 mg Subcutaneous Q24H  . ketorolac  10 mg Oral Daily  . lisinopril  5 mg Oral Daily  . metroNIDAZOLE  1 Applicatorful Vaginal BID  . sertraline  100 mg Oral Daily  . sodium chloride  3 mL Intravenous Q12H  . tiZANidine  2 mg Oral BID   Continuous Infusions:    Marinda Elk  [facility] Pager [phone]. If 8PM-8AM, please contact night-coverage at [email or web address], password [facility] 10/29/2012, 8:25 AM  LOS: 1 day

## 2012-10-29 NOTE — Progress Notes (Signed)
Patient acknowledged that she understood all discharge instructions, papers were signed, copy in chart.  Patient tele d/cd and all lines removed.  NT took patient via wheelchair down to car.

## 2012-10-30 NOTE — Progress Notes (Addendum)
   CARE MANAGEMENT NOTE 10/30/2012  Patient:  Wanda [name], Wanda [name]   Account Number:  [id number]  Date Initiated:  10/30/2012  Documentation initiated by:  [facility]  Subjective/Objective Assessment:   migraines     Action/Plan:   Anticipated DC Date:  10/29/2012   Anticipated DC Plan:  HOME W HOME HEALTH SERVICES      DC Planning Services  CM consult      [facility] Choice  HOME HEALTH   Choice offered to / List presented to:  C-1 Patient   DME arranged  Levan Hurst      DME agency  [facility]     HH arranged  HH-2 PT  HH-3 OT      [facility] agency  [facility]   Status of service:  Completed, signed off Medicare Important Message given?   (If response is "NO", the following Medicare IM given date fields will be blank) Date Medicare IM given:   Date Additional Medicare IM given:    Discharge Disposition:  HOME W HOME HEALTH SERVICES  Per UR Regulation:    If discussed at Long Length of Stay Meetings, dates discussed:    Comments:  10/30/2012 9:47 AM   NCM spoke to [facility] rep and they will ship RW to her home. Pt scheduled for [facility] with [facility]. Attempted call to pt and left voice message for pt to call NCM. Isidoro Donning RN CCM Case Mgmt phone [phone]

## 2012-12-22 ENCOUNTER — Telehealth: Payer: Self-pay | Admitting: Neurology

## 2012-12-22 NOTE — Telephone Encounter (Signed)
Sent to Dr. Pearlean Brownie .  FYI

## 2012-12-22 NOTE — Telephone Encounter (Signed)
ok 

## 2012-12-26 NOTE — Telephone Encounter (Signed)
ok 

## 2013-02-10 ENCOUNTER — Encounter: Payer: Self-pay | Admitting: Neurology

## 2013-02-10 ENCOUNTER — Ambulatory Visit (INDEPENDENT_AMBULATORY_CARE_PROVIDER_SITE_OTHER): Payer: 59 | Admitting: Neurology

## 2013-02-10 VITALS — BP 131/79 | HR 106

## 2013-02-10 DIAGNOSIS — R51 Headache: Secondary | ICD-10-CM

## 2013-02-10 DIAGNOSIS — G43719 Chronic migraine without aura, intractable, without status migrainosus: Secondary | ICD-10-CM

## 2013-02-10 NOTE — Patient Instructions (Signed)
Patient was given 200 units of Botox into the face neck and upper shoulder muscles as per migraine protocol. Tender points also injected in admission for total of 200 units. She was advised to continue to use Relpax as needed as well as Zanaflex and Depakote for headache prophylaxis. She was advised to keep her upcoming appointment at the headache clinic at [facility] for second opinion and the eye clinic as well. Return for followup in the future in 3 months.

## 2013-02-10 NOTE — Progress Notes (Signed)
HPI:  47 year  lady with chronic headaches which are transformed mixed migraine headaches with tension. She has failed trials of Topamax, Depakote in the past but has refused Botox. She also has remote history of TIA in April 2013 but has remained stable from a vascular standpoint.  She returns for followup of the last visit on 10/21/12 . She continues to have significant headaches which he describes as being severe and disabling 3-days per week. Which are improved after the Botox injection. She first noticed benefit at around 4 weeks and it lasted nearly 2 months but the last week has been particularly bad with headaches returning and being quite severe. She takes Depakote 500 twice a day as well as takes Zanaflex and Relpax for symptomatic relief but this is only temporary. She would like to try Botox for a little longer. She has an upcoming appointment at the headache clinic at [facility] on 02/22/13 as well as another appointment at the eye clinic on 02/17/13. ROS:  14 system review of systems is positive for headache, neck pain, muscle spasm, dizziness, sleeping difficulties.  Head: head normocephalic and atraumatic. Orohparynx benign Neck: supple with no carotid or supraclavicular bruits Cardiovascular: regular rate and rhythm, no murmurs Musculoskeletal mild spasm posterior neck muscles and restriction of range of motion  Neurologic Exam Mental Status: Awake and fully alert. Oriented to place and time. Recent and remote memory intact. Attention span, concentration and fund of knowledge appropriate. Mood and affect appropriate.  Cranial Nerves: Fundoscopic exam not done  Pupils equal, briskly reactive to light. Extraocular movements full without nystagmus. Visual fields full to confrontation. Hearing intact and symmetric to finer snap. Facial sensation intact. Face, tongue, palate move normally and symmetrically. Neck flexion and extension normal.  Motor: Normal bulk and tone. Normal strength  in all tested extremity muscles. Sensory.: intact to tough and pinprick and vibratory.  Coordination: Rapid alternating movements normal in all extremities. Finger-to-nose and heel-to-shin performed accurately bilaterally. Gait and Station: Arises from chair without difficulty. Stance is normal. Gait demonstrates normal stride length and balance . Able to heel, toe and tandem walk without difficulty.  Reflexes: 1+ and symmetric. Toes downgoing.     ASSESSMENT::  47 year old lady with chronic headaches likely transformed mixed migraine headaches with tension. She has failed trials of Topamax, Depakote and now shown some response to Botox. Remote history of TIA in April 2013.   PLAN:  Continued Depakote for headache prevention and Zanaflex and Relpax for symptomatic relief. I encouraged her to do regular neck stretching exercises and participate in regular activities for stress relaxation as well. We will give Botox injections today for migraine prevention. She was advised to keep her upcoming appointments at [facility] at the headache clinic for second opinion as well as at the ophthalmology clinic.  BOTOX PROCEDURE NOTE :  After taking written informed consent from the patient explaining risk benefit specifically pain, infection, bleeding and muscle paralysis 200 units of Botox were administered intramuscularly into the face and neck muscles under aseptic precautions. at 31 sites as specified by the migraine protocol. The remaining injections were into tender spots pointed out by the patient in her shoulder blades. The orbicularis, mentalis, frontalis, temporalis, occipitalis, posterior neck and upper scapular muscles were injected at sites as per the migraine Botox protocol. The remaining injection sites were at trigger points shown by the patient. A total of 200 units were injected with no amount wasted. Lot # was [id number] expiry date  Feb 2017 .The patient tolerated the procedure well without  any major complications noted. She was advised to return back in 3 months for additional Botox injections

## 2013-03-16 ENCOUNTER — Ambulatory Visit: Payer: 59 | Admitting: Neurology

## 2013-03-16 ENCOUNTER — Ambulatory Visit: Payer: Self-pay | Admitting: Neurology

## 2013-03-16 ENCOUNTER — Telehealth: Payer: Self-pay | Admitting: *Deleted

## 2013-03-16 NOTE — Telephone Encounter (Signed)
Called the patient and cancel appointment. She showed understanding and has been scheduled with Dr.Sethi.

## 2013-04-14 ENCOUNTER — Institutional Professional Consult (permissible substitution): Payer: 59 | Admitting: Internal Medicine

## 2013-04-28 ENCOUNTER — Ambulatory Visit (INDEPENDENT_AMBULATORY_CARE_PROVIDER_SITE_OTHER): Payer: 59 | Admitting: Cardiovascular Disease

## 2013-04-28 ENCOUNTER — Encounter: Payer: Self-pay | Admitting: Cardiovascular Disease

## 2013-04-28 VITALS — BP 140/78 | HR 100 | Ht 66.0 in | Wt 229.0 lb

## 2013-04-28 DIAGNOSIS — R0600 Dyspnea, unspecified: Secondary | ICD-10-CM

## 2013-04-28 DIAGNOSIS — R0989 Other specified symptoms and signs involving the circulatory and respiratory systems: Secondary | ICD-10-CM

## 2013-04-28 DIAGNOSIS — I10 Essential (primary) hypertension: Secondary | ICD-10-CM

## 2013-04-28 DIAGNOSIS — I498 Other specified cardiac arrhythmias: Secondary | ICD-10-CM

## 2013-04-28 DIAGNOSIS — R0609 Other forms of dyspnea: Secondary | ICD-10-CM

## 2013-04-28 DIAGNOSIS — R000 Tachycardia, unspecified: Secondary | ICD-10-CM

## 2013-04-28 LAB — BASIC METABOLIC PANEL
BUN: 18 mg/dL (ref 6–23)
CO2: 27 mEq/L (ref 19–32)
Calcium: 9.1 mg/dL (ref 8.4–10.5)
GFR: 87.87 mL/min (ref >60.00)
Glucose, Bld: 88 mg/dL (ref 70–99)

## 2013-04-28 NOTE — Progress Notes (Signed)
History of Present Illness: 47 yo female with history of HTN, HLD, TIA, chronic migraine headaches, depression, lumbar disc disease here today as a new patient for evaluation of HTN, SOB, tachycardia. Echo April 2013 with normal LV function. She has noticed her heart racing over the last few months. Lasts for 4-5 minutes. No irregularity. Her heart rate seems to be constantly elevated. No near syncope or syncope. She has also noticed SOB. Recent adjustment in her medication for HTN. She has eliminated caffeine from her diet. She does not smoke. She sees Neurology for migraine headaches.   Primary Care Physician: Cammie Fulp  Last Lipid Profile:Lipid Panel     Component Value Date/Time   CHOL 188 10/29/2011 0535   TRIG 100 10/29/2011 0535   HDL 46 10/29/2011 0535   CHOLHDL 4.1 10/29/2011 0535   VLDL 20 10/29/2011 0535   LDLCALC 122* 10/29/2011 0535     Past Medical History  Diagnosis Date  . Depression   . Fatigue   . Chronic headache   . TMJ pain dysfunction syndrome   . Obesity   . Sinusitis   . Hypertension   . Hypercholesterolemia   . Transient ischemic attack (TIA)     Past Surgical History  Procedure Laterality Date  . Back surgery      x 5  . Torn disk l4 l5 s1      surgery by Kritzer 9/11 +10/11  . Nasal sinus surgery    . Vaginal hysterectomy      for DUB in 10/2003  . Cholecystectomy      11/19/09  . Gallbladder surgery    . Knee arthroscopy  02/2000    Current Outpatient Prescriptions  Medication Sig Dispense Refill  . aspirin 325 MG tablet Take 325 mg by mouth daily.      . baclofen (LIORESAL) 10 MG tablet Take 10 mg by mouth as needed.      . divalproex (DEPAKOTE) 500 MG DR tablet Take 500 mg by mouth 2 (two) times daily.      . hydrochlorothiazide (MICROZIDE) 12.5 MG capsule Take 12.5 mg by mouth daily.      Marland Kitchen lisinopril (PRINIVIL,ZESTRIL) 40 MG tablet Take 40 mg by mouth daily.      . metroNIDAZOLE (METROGEL) 0.75 % vaginal gel Place 1 Applicatorful vaginally  2 (two) times daily.      . nabumetone (RELAFEN) 500 MG tablet Take 500 mg by mouth 2 (two) times daily.      . promethazine (PHENERGAN) 25 MG tablet Take 25 mg by mouth every 6 (six) hours as needed for nausea.      . rosuvastatin (CRESTOR) 10 MG tablet Take 10 mg by mouth daily.      . sertraline (ZOLOFT) 100 MG tablet Take 100 mg by mouth 2 (two) times daily.       . tizanidine (ZANAFLEX) 2 MG capsule Take 2 mg by mouth as needed.       . verapamil (COVERA HS) 180 MG (CO) 24 hr tablet Take 180 mg by mouth at bedtime.      . Vitamin D, Ergocalciferol, (DRISDOL) 50000 UNITS CAPS Take 50,000 Units by mouth every 7 (seven) days.        No current facility-administered medications for this visit.    Allergies  Allergen Reactions  . Oxycodone-Acetaminophen Itching    Patient states she is allergic to the inactive ingredient  . Oxycodone-Acetaminophen Hives    Patient states she is allergic to  the inactive ingredients  . Tylox [Oxycodone-Acetaminophen] Hives and Rash    History   Social History  . Marital Status: Divorced    Spouse Name: N/A    Number of Children: 2  . Years of Education: N/A   Occupational History  . health advisor     [employer]   Social History Main Topics  . Smoking status: Former Smoker -- 0.50 packs/day for 20 years    Types: Cigarettes    Quit date: 07/28/2003  . Smokeless tobacco: Not on file     Comment: 3/4 of pack  . Alcohol Use: No  . Drug Use: No  . Sexual Activity: Not on file   Other Topics Concern  . Not on file   Social History Narrative   Lives in [place] with daughter      Huel Cote (c) [phone]   nigel       (c) [phone]    Family History  Problem Relation Age of Onset  . Cervical cancer Maternal Grandmother   . Cancer Maternal Grandmother     mouth  . Hypertension Brother   . Heart failure Brother     ICD  . Heart attack Maternal Grandmother 55    Review of Systems:  As stated in the HPI and otherwise negative.    BP 140/78  Pulse 100  Ht 5\' 6"  (1.676 m)  Wt 229 lb (103.874 kg)  BMI 36.98 kg/m2  Physical Examination: General: Well developed, well nourished, NAD HEENT: OP clear, mucus membranes moist SKIN: warm, dry. No rashes. Neuro: No focal deficits Musculoskeletal: Muscle strength 5/5 all ext Psychiatric: Mood and affect normal Neck: No JVD, no carotid bruits, no thyromegaly, no lymphadenopathy. Lungs:Clear bilaterally, no wheezes, rhonci, crackles Cardiovascular: Regular rate and rhythm. No murmurs, gallops or rubs. Abdomen:Soft. Bowel sounds present. Non-tender.  Extremities: No lower extremity edema. Pulses are 2 + in the bilateral DP/PT.  EKG: Sinus tach, rate 102 bpm. Non-specific T wave abnormality.   Echo 10/29/11: Left ventricle: The cavity size was normal. Wall thickness was normal. Systolic function was normal. The estimated ejection fraction was in the range of 55% to 60%. Wall motion was normal; there were no regional wall motion abnormalities. Doppler parameters are consistent with abnormal left ventricular relaxation (grade 1 diastolic dysfunction). - Aortic valve: There was no stenosis. - Mitral valve: No significant regurgitation. - Right ventricle: The cavity size was normal. Systolic function was normal. - Pulmonary arteries: No complete TR doppler jet so unable to estimate PA systolic pressure. - Inferior vena cava: The vessel was normal in size; the respirophasic diameter changes were in the normal range (= 50%); findings are consistent with normal central venous pressure. Impressions:  - Normal LV size and systolic function, EF 55-60%. Normal RV size and systolic function. No significant valvular dysfunction.  Assessment and Plan:   1. Tachycardia: Sinus tach. Will check TSH and BMET. CTA chest to exclude PE. Echo to assess LV function, exclude pericardial effusion. 48 hour monitor to exclude arrythmias.   2. HTN: No changes today. Consider beta  blocker.   3. Dyspnea: as above. Echo to exclude effusion, assess LVEF.  and CTA chest to exclude PE

## 2013-04-28 NOTE — Patient Instructions (Addendum)
Your physician recommends that you schedule a follow-up appointment in: 2-3 weeks.   Non-Cardiac CT scanning, (CAT scanning), is a noninvasive, special x-ray that produces cross-sectional images of the body using x-rays and a computer. CT scans help physicians diagnose and treat medical conditions. For some CT exams, a contrast material is used to enhance visibility in the area of the body being studied. CT scans provide greater clarity and reveal more details than regular x-ray exams.   Your physician has requested that you have an echocardiogram. Echocardiography is a painless test that uses sound waves to create images of your heart. It provides your doctor with information about the size and shape of your heart and how well your heart's chambers and valves are working. This procedure takes approximately one hour. There are no restrictions for this procedure.   Your physician has recommended that you wear a holter monitor. Holter monitors are medical devices that record the heart's electrical activity. Doctors most often use these monitors to diagnose arrhythmias. Arrhythmias are problems with the speed or rhythm of the heartbeat. The monitor is a small, portable device. You can wear one while you do your normal daily activities. This is usually used to diagnose what is causing palpitations/syncope (passing out).

## 2013-05-03 ENCOUNTER — Encounter: Payer: Self-pay | Admitting: *Deleted

## 2013-05-03 ENCOUNTER — Ambulatory Visit (HOSPITAL_COMMUNITY): Payer: 59 | Attending: Cardiology | Admitting: Radiology

## 2013-05-03 ENCOUNTER — Ambulatory Visit (INDEPENDENT_AMBULATORY_CARE_PROVIDER_SITE_OTHER): Payer: 59 | Admitting: Neurology

## 2013-05-03 ENCOUNTER — Encounter (INDEPENDENT_AMBULATORY_CARE_PROVIDER_SITE_OTHER): Payer: 59

## 2013-05-03 ENCOUNTER — Ambulatory Visit (INDEPENDENT_AMBULATORY_CARE_PROVIDER_SITE_OTHER)
Admission: RE | Admit: 2013-05-03 | Discharge: 2013-05-03 | Disposition: A | Discharge status: Home / Self Care | Payer: 59 | Source: Ambulatory Visit | Attending: Cardiovascular Disease | Admitting: Cardiovascular Disease

## 2013-05-03 ENCOUNTER — Other Ambulatory Visit: Payer: Self-pay

## 2013-05-03 ENCOUNTER — Encounter: Payer: Self-pay | Admitting: Neurology

## 2013-05-03 VITALS — BP 130/83 | HR 117 | Ht 67.25 in | Wt 233.0 lb

## 2013-05-03 DIAGNOSIS — I059 Rheumatic mitral valve disease, unspecified: Secondary | ICD-10-CM | POA: Insufficient documentation

## 2013-05-03 DIAGNOSIS — M5412 Radiculopathy, cervical region: Secondary | ICD-10-CM

## 2013-05-03 DIAGNOSIS — I10 Essential (primary) hypertension: Secondary | ICD-10-CM | POA: Insufficient documentation

## 2013-05-03 DIAGNOSIS — I079 Rheumatic tricuspid valve disease, unspecified: Secondary | ICD-10-CM | POA: Insufficient documentation

## 2013-05-03 DIAGNOSIS — Z87891 Personal history of nicotine dependence: Secondary | ICD-10-CM | POA: Insufficient documentation

## 2013-05-03 DIAGNOSIS — R0609 Other forms of dyspnea: Secondary | ICD-10-CM | POA: Insufficient documentation

## 2013-05-03 DIAGNOSIS — R0989 Other specified symptoms and signs involving the circulatory and respiratory systems: Secondary | ICD-10-CM | POA: Insufficient documentation

## 2013-05-03 DIAGNOSIS — R000 Tachycardia, unspecified: Secondary | ICD-10-CM | POA: Insufficient documentation

## 2013-05-03 DIAGNOSIS — R0602 Shortness of breath: Secondary | ICD-10-CM | POA: Insufficient documentation

## 2013-05-03 DIAGNOSIS — I498 Other specified cardiac arrhythmias: Secondary | ICD-10-CM

## 2013-05-03 DIAGNOSIS — M5010 Cervical disc disorder with radiculopathy, unspecified cervical region: Secondary | ICD-10-CM

## 2013-05-03 DIAGNOSIS — E785 Hyperlipidemia, unspecified: Secondary | ICD-10-CM | POA: Insufficient documentation

## 2013-05-03 DIAGNOSIS — E669 Obesity, unspecified: Secondary | ICD-10-CM | POA: Insufficient documentation

## 2013-05-03 MED ORDER — IOHEXOL 350 MG/ML SOLN
80.0000 mL | Freq: Once | INTRAVENOUS | Status: AC | PRN
  Administered 2013-05-03: 80 mL via INTRAVENOUS

## 2013-05-03 MED ORDER — NORTRIPTYLINE HCL 25 MG PO CAPS: 50.0000 mg | ORAL_CAPSULE | Freq: Every day | ORAL | Status: DC
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
## 2013-05-03 NOTE — Patient Instructions (Signed)
Increase nortriptyline to 50 mg at night for paresthesias. Continue Zanaflex for headaches and Depakote. Check EMG nerve conduction study for carpal tunnel an MRI scan of the cervical spine for radiculopathy. Return for followup in 6 weeks or call earlier if necessary.

## 2013-05-03 NOTE — Progress Notes (Signed)
Echocardiogram performed.  

## 2013-05-03 NOTE — Progress Notes (Signed)
Patient ID: Wanda [name], female   DOB: [birth year], 47 y.o.   MRN: [id number] E-Cardio 48 Hour Holter Monitor applied to patient.

## 2013-05-08 ENCOUNTER — Encounter (INDEPENDENT_AMBULATORY_CARE_PROVIDER_SITE_OTHER): Payer: 59 | Admitting: Radiology

## 2013-05-08 ENCOUNTER — Ambulatory Visit (INDEPENDENT_AMBULATORY_CARE_PROVIDER_SITE_OTHER): Payer: 59 | Admitting: Neurology

## 2013-05-08 DIAGNOSIS — M5010 Cervical disc disorder with radiculopathy, unspecified cervical region: Secondary | ICD-10-CM

## 2013-05-08 DIAGNOSIS — G5600 Carpal tunnel syndrome, unspecified upper limb: Secondary | ICD-10-CM

## 2013-05-08 DIAGNOSIS — G5602 Carpal tunnel syndrome, left upper limb: Secondary | ICD-10-CM

## 2013-05-08 DIAGNOSIS — Z0289 Encounter for other administrative examinations: Secondary | ICD-10-CM

## 2013-05-08 NOTE — Procedures (Signed)
    [facility]  NCS (NERVE CONDUCTION STUDY) WITH EMG (ELECTROMYOGRAPHY) REPORT   STUDY DATE: Oct 13th 2014. PATIENT NAME: Wanda [name] DOB: 15-Sep-1965 MRN: [id number]    TECHNOLOGIST: Kaylyn Lim. ELECTROMYOGRAPHER: Levert Feinstein M.D.  CLINICAL INFORMATION:   47 yo RH AAF presenting with left hand coldness, paresthesia involving left 1,2, 3rd fingers.   On exam: She has giveaway weakness of left upper extremity muscles, including left abductor pollicis brevis, wrist flexion, wrist extension. Deep tendon reflexes were normal and symmetric.    FINDINGS: NERVE CONDUCTION STUDY: Left ulnar sensory and motor responses were normal. Left radial sensory response was normal. Left median sensory and  mixed response showed mildly prolonged peak latency, with normal snap amplitude. Left median motor response showed mild to moderately prolonged distal latency, was normal C. map amplitude, conduction velocity, and F wave latency.   NEEDLE ELECTROMYOGRAPHY: Selected needle examination was performed at the left upper extremity muscles, and the left cervical paraspinal muscles.  Left abductor pollicis brevis: normally insertion activity, no spontaneous activity, normal morphology motor unit potential, with no recruitment patterns .  Needle examination of left pronator teres, biceps, deltoid, triceps extensor digitorum communis,  was normal    There was no spontaneous activity at the left cervical paraspinal muscles, left C5, 6, C7.  IMPRESSION:  This is an abnormal study. There is electrodiagnostic evidence of moderate left median neuropathy across the wrist, consistent with a moderate left carpal tunnel syndrome. There is no electrodiagnostic evidence of left cervical radiculopathy.  INTERPRETING PHYSICIAN:   Levert Feinstein M.D. Ph.D. [facility] [address] [place], [place] [address] [phone]

## 2013-05-09 ENCOUNTER — Telehealth: Payer: Self-pay | Admitting: *Deleted

## 2013-05-09 NOTE — Addendum Note (Signed)
Addended byHermenia Fiscal on: 05/09/2013 09:49 AM   Modules accepted: Orders

## 2013-05-09 NOTE — Telephone Encounter (Signed)
I called pt after speaking with Dr. Pearlean Brownie about the NCS/EMG results of L carpal tunnel syndrome.  Pt is to wear L wrist splint except at night. If sx continue then will address MRI C spine again. Pt verbalized understanding.   Wanda [name] is MRI [place]. Notified about cancelled MRI.

## 2013-05-16 ENCOUNTER — Ambulatory Visit (INDEPENDENT_AMBULATORY_CARE_PROVIDER_SITE_OTHER): Payer: 59 | Admitting: Cardiovascular Disease

## 2013-05-16 ENCOUNTER — Encounter: Payer: Self-pay | Admitting: Cardiovascular Disease

## 2013-05-16 VITALS — BP 128/88 | HR 110 | Ht 66.0 in | Wt 231.1 lb

## 2013-05-16 DIAGNOSIS — I10 Essential (primary) hypertension: Secondary | ICD-10-CM

## 2013-05-16 DIAGNOSIS — I498 Other specified cardiac arrhythmias: Secondary | ICD-10-CM

## 2013-05-16 DIAGNOSIS — R000 Tachycardia, unspecified: Secondary | ICD-10-CM

## 2013-05-16 NOTE — Patient Instructions (Signed)
Your physician recommends that you schedule a follow-up appointment as needed  

## 2013-05-16 NOTE — Progress Notes (Signed)
History of Present Illness: 47 yo female with history of HTN, HLD, TIA, chronic migraine headaches, depression, lumbar disc disease here today for cardiac follow up. She was seen as a new patient 04/28/13 for evaluation of HTN, SOB, tachycardia. Echo April 2013 with normal LV function. She had noticed her heart racing over the last few months. Lasts for 4-5 minutes. No irregularity. Her heart rate seems to be constantly elevated. No near syncope or syncope. She has also noticed SOB. She has recently eliminated caffeine from her diet. She does not smoke. She sees Neurology for migraine headaches. I arranged an echo, CTA chest and 48 hour Holter monitor. Echo with normal LV size and function, mild LVH, no valve abnormalities. CTA chest negative for PE. 24 hour monitor with sinus rhythm, episodes of sinus tach and rare PACs (2 in 24 hours). TSH and BMET ok.   She is here today for follow up. She has been feeling well. No chest pain or SOB.   Primary Care Physician: Cammie Fulp  Last Lipid Profile:Lipid Panel     Component Value Date/Time   CHOL 188 10/29/2011 0535   TRIG 100 10/29/2011 0535   HDL 46 10/29/2011 0535   CHOLHDL 4.1 10/29/2011 0535   VLDL 20 10/29/2011 0535   LDLCALC 122* 10/29/2011 0535     Past Medical History  Diagnosis Date  . Depression   . Fatigue   . Chronic headache   . TMJ pain dysfunction syndrome   . Obesity   . Sinusitis   . Hypertension   . Hypercholesterolemia   . Transient ischemic attack (TIA)     Past Surgical History  Procedure Laterality Date  . Back surgery      x 5  . Torn disk l4 l5 s1      surgery by [facility] 9/11 +10/11  . Nasal sinus surgery    . Vaginal hysterectomy      for DUB in 10/2003  . Cholecystectomy      11/19/09  . Gallbladder surgery    . Knee arthroscopy  02/2000    Current Outpatient Prescriptions  Medication Sig Dispense Refill  . aspirin 325 MG tablet Take 325 mg by mouth daily.      . baclofen (LIORESAL) 10 MG tablet Take 10  mg by mouth as needed.      . divalproex (DEPAKOTE) 500 MG DR tablet Take 500 mg by mouth 2 (two) times daily.      . hydrochlorothiazide (MICROZIDE) 12.5 MG capsule Take 12.5 mg by mouth daily.      Marland Kitchen lisinopril (PRINIVIL,ZESTRIL) 40 MG tablet Take 40 mg by mouth daily.      . metroNIDAZOLE (METROGEL) 0.75 % vaginal gel Place 1 Applicatorful vaginally 2 (two) times daily.      . nabumetone (RELAFEN) 500 MG tablet Take 500 mg by mouth 2 (two) times daily.      . nortriptyline (PAMELOR) 25 MG capsule Take 2 capsules (50 mg total) by mouth at bedtime.  60 capsule  1  . omeprazole (PRILOSEC) 20 MG capsule Take 20 mg by mouth daily.      . promethazine (PHENERGAN) 25 MG tablet Take 25 mg by mouth every 6 (six) hours as needed for nausea.      . rosuvastatin (CRESTOR) 10 MG tablet Take 10 mg by mouth daily.      . sertraline (ZOLOFT) 100 MG tablet Take 100 mg by mouth 2 (two) times daily.       Marland Kitchen  tizanidine (ZANAFLEX) 2 MG capsule Take 2 mg by mouth as needed.       . verapamil (COVERA HS) 180 MG (CO) 24 hr tablet Take 180 mg by mouth at bedtime.      . Vitamin D, Ergocalciferol, (DRISDOL) 50000 UNITS CAPS Take 50,000 Units by mouth every 7 (seven) days.        No current facility-administered medications for this visit.    Allergies  Allergen Reactions  . Oxycodone-Acetaminophen Itching    Patient states she is allergic to the inactive ingredient  . Tylox [Oxycodone-Acetaminophen] Hives and Rash    History   Social History  . Marital Status: Divorced    Spouse Name: N/A    Number of Children: 2  . Years of Education: N/A   Occupational History  . health advisor     [employer]   Social History Main Topics  . Smoking status: Former Smoker -- 0.50 packs/day for 20 years    Types: Cigarettes    Quit date: 07/28/2003  . Smokeless tobacco: Not on file     Comment: 3/4 of pack  . Alcohol Use: No  . Drug Use: No  . Sexual Activity: Yes    Partners: Male   Other Topics  Concern  . Not on file   Social History Narrative   Lives in [place] with daughter      Huel Cote (c) [phone]   nigel       (c) [phone]    Family History  Problem Relation Age of Onset  . Cervical cancer Maternal Grandmother   . Cancer Maternal Grandmother     mouth  . Hypertension Brother   . Heart failure Brother     ICD  . Heart attack Maternal Grandmother 55    Review of Systems:  As stated in the HPI and otherwise negative.   BP 128/88  Pulse 110  Ht 5\' 6"  (1.676 m)  Wt 231 lb 1.9 oz (104.835 kg)  BMI 37.32 kg/m2  Physical Examination: General: Well developed, well nourished, NAD HEENT: OP clear, mucus membranes moist SKIN: warm, dry. No rashes. Neuro: No focal deficits Musculoskeletal: Muscle strength 5/5 all ext Psychiatric: Mood and affect normal Neck: No JVD, no carotid bruits, no thyromegaly, no lymphadenopathy. Lungs:Clear bilaterally, no wheezes, rhonci, crackles Cardiovascular: Regular rate and rhythm. No murmurs, gallops or rubs. Abdomen:Soft. Bowel sounds present. Non-tender.  Extremities: No lower extremity edema. Pulses are 2 + in the bilateral DP/PT.  EKG: Sinus tach, rate 102 bpm. Non-specific T wave abnormality.   Echo 05/03/13: Left ventricle: The cavity size was normal. Wall thickness was increased in a pattern of mild LVH. Systolic function was normal. The estimated ejection fraction was in the range of 60% to 65%. Wall motion was normal; there were no regional wall motion abnormalities. - Atrial septum: No defect or patent foramen ovale was Identified.  CTA chest 05/03/13: Stable exam. No CT evidence for acute pulmonary embolus. Prominent lymph nodes in both axillary regions are unchanged in the 2 year interval since the prior study.   Assessment and Plan:   1. Tachycardia: Sinus tach on monitor. TSH and BMET ok. CTA chest negative for PE. Echo with normal LV size and function. No pericardial effusion. 24 hour monitor without  arrythmias. (Rare PACs-2 in 24 hours). I do not see any pathological reason for her tachycardia. No further cardiac workup.   2. HTN: BP controlled. Consider beta blocker.

## 2013-05-18 NOTE — Progress Notes (Signed)
Quick Note:  Results given to pt previously. ______

## 2013-06-08 ENCOUNTER — Encounter: Payer: Self-pay | Admitting: Neurology

## 2013-06-08 ENCOUNTER — Ambulatory Visit (INDEPENDENT_AMBULATORY_CARE_PROVIDER_SITE_OTHER): Payer: 59 | Admitting: Neurology

## 2013-06-08 VITALS — BP 120/80 | HR 126 | Ht 67.25 in | Wt 241.0 lb

## 2013-06-08 DIAGNOSIS — G5600 Carpal tunnel syndrome, unspecified upper limb: Secondary | ICD-10-CM

## 2013-06-08 DIAGNOSIS — G5602 Carpal tunnel syndrome, left upper limb: Secondary | ICD-10-CM

## 2013-06-08 MED ORDER — DIVALPROEX SODIUM 250 MG PO DR TAB: 250.0000 mg | DELAYED_RELEASE_TABLET | Freq: Two times a day (BID) | ORAL | Status: DC
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
## 2013-06-08 NOTE — Patient Instructions (Signed)
Plan Taper Depakote to 250 mg twice daily for 2 weeks then to 50 mg at night for 2 weeks and discontinue. Continue Zanaflex and baclofen at night. She was advised to use left wrist splint all the time for carpal tunnel. She is not keen to consider surgery for carpal tunnel at the present time. Return for followup in 3 months with Larita Fife, nurse practicioner or call earlier if necessary.

## 2013-06-14 ENCOUNTER — Ambulatory Visit: Payer: 59 | Admitting: Neurology

## 2013-06-19 ENCOUNTER — Telehealth: Payer: Self-pay | Admitting: Neurology

## 2013-06-19 NOTE — Telephone Encounter (Signed)
After consulting Dr. Pearlean Brownie, relayed to pt that she may return to her previous depakote dose.  She verbalized understanding.

## 2013-06-19 NOTE — Telephone Encounter (Signed)
Go back on prior depakote dose

## 2013-06-25 ENCOUNTER — Other Ambulatory Visit: Payer: Self-pay | Admitting: Neurology

## 2013-08-25 ENCOUNTER — Ambulatory Visit: Payer: 59 | Admitting: Nurse Practitioner

## 2013-09-18 ENCOUNTER — Ambulatory Visit: Payer: 59 | Admitting: Nurse Practitioner

## 2013-12-25 HISTORY — PX: CARPAL TUNNEL RELEASE: SHX101

## 2014-02-22 DIAGNOSIS — M2240 Chondromalacia patellae, unspecified knee: Secondary | ICD-10-CM | POA: Insufficient documentation

## 2014-03-07 ENCOUNTER — Other Ambulatory Visit: Payer: Self-pay | Admitting: Neurosurgery

## 2014-03-07 DIAGNOSIS — M545 Low back pain: Secondary | ICD-10-CM

## 2014-03-15 ENCOUNTER — Ambulatory Visit
Admission: RE | Admit: 2014-03-15 | Discharge: 2014-03-15 | Disposition: A | Discharge status: Home / Self Care | Payer: No Typology Code available for payment source | Source: Ambulatory Visit | Attending: Neurosurgery | Admitting: Neurosurgery

## 2014-03-15 VITALS — BP 103/57 | HR 84

## 2014-03-15 DIAGNOSIS — S335XXS Sprain of ligaments of lumbar spine, sequela: Secondary | ICD-10-CM

## 2014-03-15 DIAGNOSIS — M545 Low back pain: Secondary | ICD-10-CM

## 2014-03-15 MED ORDER — ONDANSETRON HCL 4 MG/2ML IJ SOLN
4.0000 mg | Freq: Once | INTRAMUSCULAR | Status: AC
  Administered 2014-03-15: 4 mg via INTRAMUSCULAR

## 2014-03-15 MED ORDER — DIAZEPAM 5 MG PO TABS
10.0000 mg | ORAL_TABLET | Freq: Once | ORAL | Status: AC
  Administered 2014-03-15: 10 mg via ORAL

## 2014-03-15 MED ORDER — IOHEXOL 180 MG/ML  SOLN
15.0000 mL | Freq: Once | INTRAMUSCULAR | Status: AC | PRN
  Administered 2014-03-15: 15 mL via INTRATHECAL

## 2014-03-15 MED ORDER — MEPERIDINE HCL 100 MG/ML IJ SOLN
100.0000 mg | Freq: Once | INTRAMUSCULAR | Status: AC
  Administered 2014-03-15: 100 mg via INTRAMUSCULAR

## 2014-03-15 NOTE — Discharge Instructions (Signed)
Myelogram Discharge Instructions  1. Go home and rest quietly for the next 24 hours.  It is important to lie flat for the next 24 hours.  Get up only to go to the restroom.  You may lie in the bed or on a couch on your back, your stomach, your left side or your right side.  You may have one pillow under your head.  You may have pillows between your knees while you are on your side or under your knees while you are on your back.  2. DO NOT drive today.  Recline the seat as far back as it will go, while still wearing your seat belt, on the way home.  3. You may get up to go to the bathroom as needed.  You may sit up for 10 minutes to eat.  You may resume your normal diet and medications unless otherwise indicated.  Drink lots of extra fluids today and tomorrow.  4. The incidence of headache, nausea, or vomiting is about 5% (one in 20 patients).  If you develop a headache, lie flat and drink plenty of fluids until the headache goes away.  Caffeinated beverages may be helpful.  If you develop severe nausea and vomiting or a headache that does not go away with flat bed rest, call [phone].  5. You may resume normal activities after your 24 hours of bed rest is over; however, do not exert yourself strongly or do any heavy lifting tomorrow. If when you get up you have a headache when standing, go back to bed and force fluids for another 24 hours.  6. Call your physician for a follow-up appointment.  The results of your myelogram will be sent directly to your physician by the following day.  7. If you have any questions or if complications develop after you arrive home, please call [phone].  Discharge instructions have been explained to the patient.  The patient, or the person responsible for the patient, fully understands these instructions.      May resume Sertraline, Nortriptyline and Phenergan on Aug. 21, 2015, after 1:00 pm.

## 2014-03-19 ENCOUNTER — Other Ambulatory Visit: Payer: Self-pay | Admitting: Neurosurgery

## 2014-04-04 ENCOUNTER — Encounter (HOSPITAL_COMMUNITY): Payer: Self-pay

## 2014-04-04 ENCOUNTER — Encounter (HOSPITAL_COMMUNITY)
Admission: RE | Admit: 2014-04-04 | Discharge: 2014-04-04 | Disposition: A | Discharge status: Home / Self Care | Payer: No Typology Code available for payment source | Source: Ambulatory Visit | Attending: Anesthesiology | Admitting: Anesthesiology

## 2014-04-04 ENCOUNTER — Encounter (HOSPITAL_COMMUNITY)
Admission: RE | Admit: 2014-04-04 | Discharge: 2014-04-04 | Disposition: A | Discharge status: Home / Self Care | Payer: No Typology Code available for payment source | Source: Ambulatory Visit | Attending: Neurosurgery | Admitting: Neurosurgery

## 2014-04-04 DIAGNOSIS — Z01810 Encounter for preprocedural cardiovascular examination: Secondary | ICD-10-CM | POA: Diagnosis present

## 2014-04-04 DIAGNOSIS — Z981 Arthrodesis status: Secondary | ICD-10-CM | POA: Diagnosis not present

## 2014-04-04 DIAGNOSIS — Z01812 Encounter for preprocedural laboratory examination: Secondary | ICD-10-CM | POA: Diagnosis present

## 2014-04-04 DIAGNOSIS — M48061 Spinal stenosis, lumbar region without neurogenic claudication: Secondary | ICD-10-CM | POA: Diagnosis not present

## 2014-04-04 DIAGNOSIS — M51379 Other intervertebral disc degeneration, lumbosacral region without mention of lumbar back pain or lower extremity pain: Secondary | ICD-10-CM | POA: Insufficient documentation

## 2014-04-04 DIAGNOSIS — M5137 Other intervertebral disc degeneration, lumbosacral region: Secondary | ICD-10-CM | POA: Diagnosis not present

## 2014-04-04 HISTORY — DX: Other specified postprocedural states: R11.2

## 2014-04-04 HISTORY — DX: Anxiety disorder, unspecified: F41.9

## 2014-04-04 HISTORY — DX: Other specified postprocedural states: Z98.890

## 2014-04-04 HISTORY — DX: Gastro-esophageal reflux disease without esophagitis: K21.9

## 2014-04-04 HISTORY — DX: Personal history of other medical treatment: Z92.89

## 2014-04-04 LAB — BASIC METABOLIC PANEL
Anion gap: 14 (ref 5–15)
BUN: 12 mg/dL (ref 6–23)
CO2: 22 meq/L (ref 19–32)
CREATININE: 0.83 mg/dL (ref 0.50–1.10)
Calcium: 9.5 mg/dL (ref 8.4–10.5)
Chloride: 104 mEq/L (ref 96–112)
GFR calc Af Amer: >90 mL/min (ref >90)
GFR calc non Af Amer: 81 mL/min — ABNORMAL LOW (ref >90)
Glucose, Bld: 107 mg/dL — ABNORMAL HIGH (ref 70–99)
Potassium: 3.6 mEq/L — ABNORMAL LOW (ref 3.7–5.3)
Sodium: 140 mEq/L (ref 137–147)

## 2014-04-04 LAB — TYPE AND SCREEN
ABO/RH(D): O POS
Antibody Screen: NEGATIVE

## 2014-04-04 LAB — CBC
HCT: 38.1 % (ref 36.0–46.0)
Hemoglobin: 12.1 g/dL (ref 12.0–15.0)
MCH: 27.2 pg (ref 26.0–34.0)
MCHC: 31.8 g/dL (ref 30.0–36.0)
MCV: 85.6 fL (ref 78.0–100.0)
Platelets: 212 10*3/uL (ref 150–400)
RBC: 4.45 MIL/uL (ref 3.87–5.11)
RDW: 15.1 % (ref 11.5–15.5)
WBC: 6.1 10*3/uL (ref 4.0–10.5)

## 2014-04-04 LAB — SURGICAL PCR SCREEN
MRSA, PCR: NEGATIVE
Staphylococcus aureus: POSITIVE — AB

## 2014-04-04 NOTE — Progress Notes (Signed)
04/04/14 1219  OBSTRUCTIVE SLEEP APNEA  Have you ever been diagnosed with sleep apnea through a sleep study? No  Do you snore loudly (loud enough to be heard through closed doors)?  1  Do you often feel tired, fatigued, or sleepy during the daytime? 0  Has anyone observed you stop breathing during your sleep? 1  Do you have, or are you being treated for high blood pressure? 1  BMI more than 35 kg/m2? 1  Age over 50 years old? 0  Neck circumference greater than 40 cm/16 inches? 0  Gender: 0  Obstructive Sleep Apnea Score 4  Score 4 or greater  Results sent to PCP

## 2014-04-04 NOTE — Progress Notes (Signed)
Primary - dr. Rodena Medin Cardiologist - dr. Bishop Limbo ( [place]) Had clearance there, - will request

## 2014-04-04 NOTE — Progress Notes (Signed)
rx called in to [place] for mupirocin.

## 2014-04-04 NOTE — Pre-Procedure Instructions (Signed)
SOFIA VANMETER  04/04/2014   Your procedure is scheduled on:  Tuesday, September 15th  Report to [facility] Admitting at 530 AM.  Call this number if you have problems the morning of surgery: [phone]   Remember:   Do not eat food or drink liquids after midnight.   Take these medicines the morning of surgery with A SIP OF WATER: atenolol, norvasc, depakote, pain medication if needed, flonase, prilosec, zoloft   Do not wear jewelry, make-up or nail polish.  Do not wear lotions, powders, or perfumes. You may wear deodorant.  Do not shave 48 hours prior to surgery. Men may shave face and neck.  Do not bring valuables to the hospital.  [facility] is not responsible for any belongings or valuables.               Contacts, dentures or bridgework may not be worn into surgery.  Leave suitcase in the car. After surgery it may be brought to your room.  For patients admitted to the hospital, discharge time is determined by your  treatment team.               Patients discharged the day of surgery will not be allowed to drive home.  Please read over the following fact sheets that you were given: Pain Booklet, Coughing and Deep Breathing, Blood Transfusion Information, MRSA Information and Surgical Site Infection Prevention [place] - Preparing for Surgery  Before surgery, you can play an important role.  Because skin is not sterile, your skin needs to be as free of germs as possible.  You can reduce the number of germs on you skin by washing with CHG (chlorahexidine gluconate) soap before surgery.  CHG is an antiseptic cleaner which kills germs and bonds with the skin to continue killing germs even after washing.  Please DO NOT use if you have an allergy to CHG or antibacterial soaps.  If your skin becomes reddened/irritated stop using the CHG and inform your nurse when you arrive at Short Stay.  Do not shave (including legs and underarms) for at least 48 hours prior to the  first CHG shower.  You may shave your face.  Please follow these instructions carefully:   1.  Shower with CHG Soap the night before surgery and the morning of Surgery.  2.  If you choose to wash your hair, wash your hair first as usual with your normal shampoo.  3.  After you shampoo, rinse your hair and body thoroughly to remove the shampoo.  4.  Use CHG as you would any other liquid soap.  You can apply CHG directly to the skin and wash gently with scrungie or a clean washcloth.  5.  Apply the CHG Soap to your body ONLY FROM THE NECK DOWN.  Do not use on open wounds or open sores.  Avoid contact with your eyes, ears, mouth and genitals (private parts).  Wash genitals (private parts) with your normal soap.  6.  Wash thoroughly, paying special attention to the area where your surgery will be performed.  7.  Thoroughly rinse your body with warm water from the neck down.  8.  DO NOT shower/wash with your normal soap after using and rinsing off the CHG Soap.  9.  Pat yourself dry with a clean towel.            10.  Wear clean pajamas.            11.  Place clean sheets on your bed the night of your first shower and do not sleep with pets.  Day of Surgery  Do not apply any lotions/deoderants the morning of surgery.  Please wear clean clothes to the hospital/surgery center.

## 2014-04-10 ENCOUNTER — Encounter (HOSPITAL_COMMUNITY)
Admission: RE | Disposition: A | Discharge status: Home / Self Care | Payer: No Typology Code available for payment source | Source: Ambulatory Visit | Attending: Neurosurgery

## 2014-04-10 ENCOUNTER — Inpatient Hospital Stay (HOSPITAL_COMMUNITY)
Admission: RE | Admit: 2014-04-10 | Discharge: 2014-04-16 | DRG: 460 | Disposition: A | Discharge status: Home / Self Care | Payer: No Typology Code available for payment source | Source: Ambulatory Visit | Attending: Neurosurgery | Admitting: Neurosurgery

## 2014-04-10 ENCOUNTER — Encounter (HOSPITAL_COMMUNITY): Payer: Self-pay | Admitting: Surgery

## 2014-04-10 ENCOUNTER — Inpatient Hospital Stay (HOSPITAL_COMMUNITY): Payer: No Typology Code available for payment source | Admitting: Certified Registered Nurse Anesthetist

## 2014-04-10 ENCOUNTER — Encounter (HOSPITAL_COMMUNITY): Payer: No Typology Code available for payment source | Admitting: Certified Registered Nurse Anesthetist

## 2014-04-10 ENCOUNTER — Inpatient Hospital Stay (HOSPITAL_COMMUNITY): Payer: No Typology Code available for payment source

## 2014-04-10 DIAGNOSIS — Z7982 Long term (current) use of aspirin: Secondary | ICD-10-CM

## 2014-04-10 DIAGNOSIS — I10 Essential (primary) hypertension: Secondary | ICD-10-CM | POA: Diagnosis present

## 2014-04-10 DIAGNOSIS — E669 Obesity, unspecified: Secondary | ICD-10-CM | POA: Diagnosis present

## 2014-04-10 DIAGNOSIS — F3289 Other specified depressive episodes: Secondary | ICD-10-CM | POA: Diagnosis present

## 2014-04-10 DIAGNOSIS — Z79899 Other long term (current) drug therapy: Secondary | ICD-10-CM

## 2014-04-10 DIAGNOSIS — F329 Major depressive disorder, single episode, unspecified: Secondary | ICD-10-CM | POA: Diagnosis present

## 2014-04-10 DIAGNOSIS — M48061 Spinal stenosis, lumbar region without neurogenic claudication: Secondary | ICD-10-CM | POA: Diagnosis present

## 2014-04-10 DIAGNOSIS — F411 Generalized anxiety disorder: Secondary | ICD-10-CM | POA: Diagnosis present

## 2014-04-10 DIAGNOSIS — K219 Gastro-esophageal reflux disease without esophagitis: Secondary | ICD-10-CM | POA: Diagnosis present

## 2014-04-10 DIAGNOSIS — E7800 Pure hypercholesterolemia, unspecified: Secondary | ICD-10-CM | POA: Diagnosis present

## 2014-04-10 DIAGNOSIS — T84498A Other mechanical complication of other internal orthopedic devices, implants and grafts, initial encounter: Principal | ICD-10-CM | POA: Diagnosis present

## 2014-04-10 DIAGNOSIS — Z6836 Body mass index (BMI) 36.0-36.9, adult: Secondary | ICD-10-CM

## 2014-04-10 DIAGNOSIS — Z8673 Personal history of transient ischemic attack (TIA), and cerebral infarction without residual deficits: Secondary | ICD-10-CM | POA: Diagnosis not present

## 2014-04-10 DIAGNOSIS — M549 Dorsalgia, unspecified: Secondary | ICD-10-CM | POA: Diagnosis present

## 2014-04-10 DIAGNOSIS — Y831 Surgical operation with implant of artificial internal device as the cause of abnormal reaction of the patient, or of later complication, without mention of misadventure at the time of the procedure: Secondary | ICD-10-CM | POA: Diagnosis present

## 2014-04-10 DIAGNOSIS — Z87891 Personal history of nicotine dependence: Secondary | ICD-10-CM | POA: Diagnosis not present

## 2014-04-10 HISTORY — PX: LUMBAR FUSION: SHX111

## 2014-04-10 SURGERY — POSTERIOR LUMBAR FUSION 1 LEVEL
Anesthesia: General | Site: Back

## 2014-04-10 MED ORDER — PANTOPRAZOLE SODIUM 40 MG IV SOLR
40.0000 mg | Freq: Every day | INTRAVENOUS | Status: DC
  Administered 2014-04-10: 40 mg via INTRAVENOUS
  Filled 2014-04-10: qty 40

## 2014-04-10 MED ORDER — PROPOFOL 10 MG/ML IV BOLUS
INTRAVENOUS | Status: AC
  Filled 2014-04-10: qty 20

## 2014-04-10 MED ORDER — NORTRIPTYLINE HCL 25 MG PO CAPS
50.0000 mg | ORAL_CAPSULE | Freq: Every day | ORAL | Status: DC
Start: 2014-04-10 — End: 2014-04-16
  Administered 2014-04-10 – 2014-04-15 (×6): 50 mg via ORAL
  Filled 2014-04-10 (×8): qty 2

## 2014-04-10 MED ORDER — LACTATED RINGERS IV SOLN
INTRAVENOUS | Status: DC | PRN
  Administered 2014-04-10 (×2): via INTRAVENOUS

## 2014-04-10 MED ORDER — HYDROMORPHONE HCL PF 1 MG/ML IJ SOLN
1.0000 mg | INTRAMUSCULAR | Status: DC | PRN
Start: 2014-04-10 — End: 2014-04-16
  Administered 2014-04-10 – 2014-04-15 (×8): 1 mg via INTRAMUSCULAR
  Filled 2014-04-10 (×10): qty 1

## 2014-04-10 MED ORDER — DEXAMETHASONE 4 MG PO TABS
4.0000 mg | ORAL_TABLET | Freq: Four times a day (QID) | ORAL | Status: AC
  Administered 2014-04-11: 4 mg via ORAL
  Filled 2014-04-10: qty 1

## 2014-04-10 MED ORDER — DEXAMETHASONE SODIUM PHOSPHATE 4 MG/ML IJ SOLN
INTRAMUSCULAR | Status: DC | PRN
  Administered 2014-04-10: 10 mg via INTRAVENOUS

## 2014-04-10 MED ORDER — GLYCOPYRROLATE 0.2 MG/ML IJ SOLN
INTRAMUSCULAR | Status: AC
  Filled 2014-04-10: qty 4

## 2014-04-10 MED ORDER — SODIUM CHLORIDE 0.9 % IV SOLN: 250.0000 mL | INTRAVENOUS | Status: DC

## 2014-04-10 MED ORDER — PHENYLEPHRINE HCL 10 MG/ML IJ SOLN
INTRAMUSCULAR | Status: DC | PRN
  Administered 2014-04-10 (×3): 80 ug via INTRAVENOUS

## 2014-04-10 MED ORDER — DOCUSATE SODIUM 100 MG PO CAPS
100.0000 mg | ORAL_CAPSULE | Freq: Two times a day (BID) | ORAL | Status: DC
  Administered 2014-04-10 – 2014-04-15 (×11): 100 mg via ORAL
  Filled 2014-04-10 (×13): qty 1

## 2014-04-10 MED ORDER — MIDAZOLAM HCL 5 MG/5ML IJ SOLN
INTRAMUSCULAR | Status: DC | PRN
  Administered 2014-04-10: 2 mg via INTRAVENOUS

## 2014-04-10 MED ORDER — SODIUM CHLORIDE 0.9 % IJ SOLN: 3.0000 mL | INTRAMUSCULAR | Status: DC | PRN

## 2014-04-10 MED ORDER — LIDOCAINE HCL (CARDIAC) 20 MG/ML IV SOLN
INTRAVENOUS | Status: DC | PRN
  Administered 2014-04-10: 40 mg via INTRAVENOUS

## 2014-04-10 MED ORDER — ZONISAMIDE 25 MG PO CAPS
150.0000 mg | ORAL_CAPSULE | Freq: Two times a day (BID) | ORAL | Status: DC
  Administered 2014-04-10 – 2014-04-16 (×12): 150 mg via ORAL
  Filled 2014-04-10 (×15): qty 2

## 2014-04-10 MED ORDER — ROCURONIUM BROMIDE 50 MG/5ML IV SOLN
INTRAVENOUS | Status: AC
  Filled 2014-04-10: qty 1

## 2014-04-10 MED ORDER — FENTANYL CITRATE 0.05 MG/ML IJ SOLN
INTRAMUSCULAR | Status: DC | PRN
  Administered 2014-04-10: 100 ug via INTRAVENOUS
  Administered 2014-04-10: 25 ug via INTRAVENOUS
  Administered 2014-04-10: 50 ug via INTRAVENOUS
  Administered 2014-04-10: 125 ug via INTRAVENOUS
  Administered 2014-04-10 (×4): 50 ug via INTRAVENOUS

## 2014-04-10 MED ORDER — ONDANSETRON HCL 4 MG/2ML IJ SOLN: 4.0000 mg | INTRAMUSCULAR | Status: DC | PRN

## 2014-04-10 MED ORDER — BACLOFEN 10 MG PO TABS
5.0000 mg | ORAL_TABLET | Freq: Three times a day (TID) | ORAL | Status: DC | PRN
  Administered 2014-04-11 – 2014-04-16 (×5): 10 mg via ORAL
  Filled 2014-04-10 (×5): qty 1

## 2014-04-10 MED ORDER — BUPIVACAINE LIPOSOME 1.3 % IJ SUSP
INTRAMUSCULAR | Status: DC | PRN
  Administered 2014-04-10: 20 mL

## 2014-04-10 MED ORDER — THROMBIN 20000 UNITS EX SOLR
CUTANEOUS | Status: DC | PRN
  Administered 2014-04-10: 09:00:00 via TOPICAL

## 2014-04-10 MED ORDER — PHENOL 1.4 % MT LIQD: 1.0000 | OROMUCOSAL | Status: DC | PRN

## 2014-04-10 MED ORDER — SERTRALINE HCL 100 MG PO TABS: 100.0000 mg | ORAL_TABLET | Freq: Two times a day (BID) | ORAL | Status: DC

## 2014-04-10 MED ORDER — SODIUM CHLORIDE 0.9 % IJ SOLN
3.0000 mL | Freq: Two times a day (BID) | INTRAMUSCULAR | Status: DC
  Administered 2014-04-12 – 2014-04-15 (×6): 3 mL via INTRAVENOUS

## 2014-04-10 MED ORDER — SCOPOLAMINE 1 MG/3DAYS TD PT72
MEDICATED_PATCH | TRANSDERMAL | Status: DC | PRN
  Administered 2014-04-10: 1 via TRANSDERMAL

## 2014-04-10 MED ORDER — ACETAMINOPHEN 325 MG PO TABS
650.0000 mg | ORAL_TABLET | ORAL | Status: DC | PRN
Start: 2014-04-10 — End: 2014-04-16
  Administered 2014-04-12: 650 mg via ORAL
  Filled 2014-04-10 (×2): qty 2

## 2014-04-10 MED ORDER — MIDAZOLAM HCL 2 MG/2ML IJ SOLN
INTRAMUSCULAR | Status: AC
  Filled 2014-04-10: qty 2

## 2014-04-10 MED ORDER — VANCOMYCIN HCL 1000 MG IV SOLR
INTRAVENOUS | Status: DC | PRN
  Administered 2014-04-10: 1000 mg

## 2014-04-10 MED ORDER — BUPIVACAINE LIPOSOME 1.3 % IJ SUSP
20.0000 mL | Freq: Once | INTRAMUSCULAR | Status: DC
  Filled 2014-04-10: qty 20

## 2014-04-10 MED ORDER — ACETAMINOPHEN 650 MG RE SUPP: 650.0000 mg | RECTAL | Status: DC | PRN

## 2014-04-10 MED ORDER — DIPHENHYDRAMINE HCL 50 MG/ML IJ SOLN
INTRAMUSCULAR | Status: DC | PRN
  Administered 2014-04-10: 12.5 mg via INTRAVENOUS

## 2014-04-10 MED ORDER — VECURONIUM BROMIDE 10 MG IV SOLR
INTRAVENOUS | Status: DC | PRN
  Administered 2014-04-10: 2 mg via INTRAVENOUS
  Administered 2014-04-10: 1 mg via INTRAVENOUS
  Administered 2014-04-10: 3 mg via INTRAVENOUS
  Administered 2014-04-10: 1 mg via INTRAVENOUS

## 2014-04-10 MED ORDER — CEFAZOLIN SODIUM-DEXTROSE 2-3 GM-% IV SOLR
INTRAVENOUS | Status: DC | PRN
  Administered 2014-04-10 (×2): 2 g via INTRAVENOUS

## 2014-04-10 MED ORDER — CEFAZOLIN SODIUM-DEXTROSE 2-3 GM-% IV SOLR
2.0000 g | Freq: Three times a day (TID) | INTRAVENOUS | Status: AC
  Administered 2014-04-10 – 2014-04-11 (×2): 2 g via INTRAVENOUS
  Filled 2014-04-10 (×2): qty 50

## 2014-04-10 MED ORDER — DEXAMETHASONE SODIUM PHOSPHATE 4 MG/ML IJ SOLN: 4.0000 mg | Freq: Four times a day (QID) | INTRAMUSCULAR | Status: AC

## 2014-04-10 MED ORDER — ATENOLOL 25 MG PO TABS
25.0000 mg | ORAL_TABLET | Freq: Every day | ORAL | Status: DC
  Administered 2014-04-12 – 2014-04-16 (×2): 25 mg via ORAL
  Filled 2014-04-10 (×5): qty 1

## 2014-04-10 MED ORDER — KCL IN DEXTROSE-NACL 20-5-0.45 MEQ/L-%-% IV SOLN
80.0000 mL/h | INTRAVENOUS | Status: DC
  Administered 2014-04-10 – 2014-04-11 (×2): 80 mL/h via INTRAVENOUS
  Filled 2014-04-10 (×2): qty 1000

## 2014-04-10 MED ORDER — ARTIFICIAL TEARS OP OINT
TOPICAL_OINTMENT | OPHTHALMIC | Status: DC | PRN
  Administered 2014-04-10: 1 via OPHTHALMIC

## 2014-04-10 MED ORDER — HYDROCODONE-ACETAMINOPHEN 5-325 MG PO TABS
1.0000 | ORAL_TABLET | ORAL | Status: DC | PRN
  Administered 2014-04-10 – 2014-04-16 (×16): 2 via ORAL
  Filled 2014-04-10 (×16): qty 2

## 2014-04-10 MED ORDER — LIDOCAINE HCL 4 % MT SOLN
OROMUCOSAL | Status: DC | PRN
  Administered 2014-04-10: 2 mL via TOPICAL

## 2014-04-10 MED ORDER — GLYCOPYRROLATE 0.2 MG/ML IJ SOLN
INTRAMUSCULAR | Status: DC | PRN
  Administered 2014-04-10: .8 mg via INTRAVENOUS

## 2014-04-10 MED ORDER — HYDROMORPHONE HCL PF 1 MG/ML IJ SOLN
0.2500 mg | INTRAMUSCULAR | Status: DC | PRN
  Administered 2014-04-10: 0.5 mg via INTRAVENOUS

## 2014-04-10 MED ORDER — SERTRALINE HCL 100 MG PO TABS
100.0000 mg | ORAL_TABLET | Freq: Every day | ORAL | Status: DC
Start: 2014-04-11 — End: 2014-04-16
  Administered 2014-04-11 – 2014-04-16 (×6): 100 mg via ORAL
  Filled 2014-04-10 (×6): qty 1

## 2014-04-10 MED ORDER — ZOLPIDEM TARTRATE 5 MG PO TABS: 5.0000 mg | ORAL_TABLET | Freq: Every evening | ORAL | Status: DC | PRN

## 2014-04-10 MED ORDER — ONDANSETRON HCL 4 MG/2ML IJ SOLN
INTRAMUSCULAR | Status: DC | PRN
  Administered 2014-04-10: 4 mg via INTRAVENOUS

## 2014-04-10 MED ORDER — NEOSTIGMINE METHYLSULFATE 10 MG/10ML IV SOLN
INTRAVENOUS | Status: DC | PRN
  Administered 2014-04-10: 5 mg via INTRAVENOUS

## 2014-04-10 MED ORDER — PHENYLEPHRINE HCL 10 MG/ML IJ SOLN
10.0000 mg | INTRAVENOUS | Status: DC | PRN
  Administered 2014-04-10: 15 ug/min via INTRAVENOUS

## 2014-04-10 MED ORDER — ROCURONIUM BROMIDE 100 MG/10ML IV SOLN
INTRAVENOUS | Status: DC | PRN
  Administered 2014-04-10: 50 mg via INTRAVENOUS

## 2014-04-10 MED ORDER — SODIUM CHLORIDE 0.9 % IR SOLN
Status: DC | PRN
  Administered 2014-04-10: 09:00:00

## 2014-04-10 MED ORDER — INFLUENZA VAC SPLIT QUAD 0.5 ML IM SUSY
0.5000 mL | PREFILLED_SYRINGE | INTRAMUSCULAR | Status: AC
  Administered 2014-04-11: 0.5 mL via INTRAMUSCULAR
  Filled 2014-04-10: qty 0.5

## 2014-04-10 MED ORDER — ALUM & MAG HYDROXIDE-SIMETH 200-200-20 MG/5ML PO SUSP: 30.0000 mL | Freq: Four times a day (QID) | ORAL | Status: DC | PRN

## 2014-04-10 MED ORDER — SCOPOLAMINE 1 MG/3DAYS TD PT72
MEDICATED_PATCH | TRANSDERMAL | Status: AC
  Filled 2014-04-10: qty 1

## 2014-04-10 MED ORDER — MENTHOL 3 MG MT LOZG: 1.0000 | LOZENGE | OROMUCOSAL | Status: DC | PRN

## 2014-04-10 MED ORDER — SERTRALINE HCL 100 MG PO TABS
200.0000 mg | ORAL_TABLET | Freq: Every day | ORAL | Status: DC
  Administered 2014-04-10 – 2014-04-15 (×6): 200 mg via ORAL
  Filled 2014-04-10 (×6): qty 2

## 2014-04-10 MED ORDER — LIDOCAINE HCL (CARDIAC) 20 MG/ML IV SOLN
INTRAVENOUS | Status: AC
  Filled 2014-04-10: qty 5

## 2014-04-10 MED ORDER — VANCOMYCIN HCL 1000 MG IV SOLR
INTRAVENOUS | Status: AC
  Filled 2014-04-10: qty 1000

## 2014-04-10 MED ORDER — ZONISAMIDE 50 MG PO CAPS: 150.0000 mg | ORAL_CAPSULE | Freq: Two times a day (BID) | ORAL | Status: DC

## 2014-04-10 MED ORDER — HYDROMORPHONE HCL PF 1 MG/ML IJ SOLN
INTRAMUSCULAR | Status: AC
  Filled 2014-04-10: qty 1

## 2014-04-10 MED ORDER — 0.9 % SODIUM CHLORIDE (POUR BTL) OPTIME
TOPICAL | Status: DC | PRN
  Administered 2014-04-10: 1000 mL

## 2014-04-10 MED ORDER — NEOSTIGMINE METHYLSULFATE 10 MG/10ML IV SOLN
INTRAVENOUS | Status: AC
  Filled 2014-04-10: qty 1

## 2014-04-10 MED ORDER — FENTANYL CITRATE 0.05 MG/ML IJ SOLN
INTRAMUSCULAR | Status: AC
  Filled 2014-04-10: qty 5

## 2014-04-10 MED ORDER — HYDROCHLOROTHIAZIDE 12.5 MG PO CAPS
12.5000 mg | ORAL_CAPSULE | Freq: Every day | ORAL | Status: DC
  Administered 2014-04-12 – 2014-04-16 (×4): 12.5 mg via ORAL
  Filled 2014-04-10 (×5): qty 1

## 2014-04-10 MED ORDER — ONDANSETRON HCL 4 MG/2ML IJ SOLN
INTRAMUSCULAR | Status: AC
  Filled 2014-04-10: qty 2

## 2014-04-10 MED ORDER — PROPOFOL 10 MG/ML IV BOLUS
INTRAVENOUS | Status: DC | PRN
  Administered 2014-04-10: 130 mg via INTRAVENOUS

## 2014-04-10 SURGICAL SUPPLY — 75 items
BAG DECANTER FOR FLEXI CONT (MISCELLANEOUS) ×3 IMPLANT
BENZOIN TINCTURE PRP APPL 2/3 (GAUZE/BANDAGES/DRESSINGS) ×3 IMPLANT
BLADE SURG ROTATE 9660 (MISCELLANEOUS) IMPLANT
BONE EQUIVA 10CC (Bone Implant) ×3 IMPLANT
BONE EQUIVA 5CC (Bone Implant) ×3 IMPLANT
BRUSH SCRUB EZ PLAIN DRY (MISCELLANEOUS) ×3 IMPLANT
BUR CUTTER 7.0 ROUND (BURR) ×3 IMPLANT
BUR MATCHSTICK NEURO 3.0 LAGG (BURR) ×3 IMPLANT
CANISTER SUCT 3000ML (MISCELLANEOUS) ×3 IMPLANT
CLOSURE WOUND 1/2 X4 (GAUZE/BANDAGES/DRESSINGS) ×1
CONT SPEC 4OZ CLIKSEAL STRL BL (MISCELLANEOUS) ×9 IMPLANT
COVER BACK TABLE 24X17X13 BIG (DRAPES) IMPLANT
COVER TABLE BACK 60X90 (DRAPES) ×3 IMPLANT
DERMABOND ADVANCED (GAUZE/BANDAGES/DRESSINGS)
DERMABOND ADVANCED .7 DNX12 (GAUZE/BANDAGES/DRESSINGS) IMPLANT
DRAPE C-ARM 42X72 X-RAY (DRAPES) ×9 IMPLANT
DRAPE LAPAROTOMY 100X72X124 (DRAPES) ×3 IMPLANT
DRAPE SURG 17X23 STRL (DRAPES) ×6 IMPLANT
DRSG OPSITE POSTOP 4X6 (GAUZE/BANDAGES/DRESSINGS) ×3 IMPLANT
DRSG TELFA 3X8 NADH (GAUZE/BANDAGES/DRESSINGS) ×3 IMPLANT
DURAPREP 26ML APPLICATOR (WOUND CARE) ×3 IMPLANT
ELECT BLADE 4.0 EZ CLEAN MEGAD (MISCELLANEOUS) ×3
ELECT REM PT RETURN 9FT ADLT (ELECTROSURGICAL) ×3
ELECTRODE BLDE 4.0 EZ CLN MEGD (MISCELLANEOUS) ×1 IMPLANT
ELECTRODE REM PT RTRN 9FT ADLT (ELECTROSURGICAL) ×1 IMPLANT
EVACUATOR 1/8 PVC DRAIN (DRAIN) ×9 IMPLANT
GAUZE SPONGE 4X4 12PLY STRL (GAUZE/BANDAGES/DRESSINGS) ×3 IMPLANT
GAUZE SPONGE 4X4 16PLY XRAY LF (GAUZE/BANDAGES/DRESSINGS) IMPLANT
GLOVE BIOGEL PI IND STRL 7.0 (GLOVE) ×6 IMPLANT
GLOVE BIOGEL PI IND STRL 8.5 (GLOVE) ×1 IMPLANT
GLOVE BIOGEL PI INDICATOR 7.0 (GLOVE) ×12
GLOVE BIOGEL PI INDICATOR 8.5 (GLOVE) ×2
GLOVE ECLIPSE 8.0 STRL XLNG CF (GLOVE) ×6 IMPLANT
GLOVE ECLIPSE 8.5 STRL (GLOVE) ×3 IMPLANT
GLOVE EXAM NITRILE LRG STRL (GLOVE) IMPLANT
GLOVE EXAM NITRILE MD LF STRL (GLOVE) IMPLANT
GLOVE EXAM NITRILE XS STR PU (GLOVE) IMPLANT
GLOVE SS BIOGEL STRL SZ 6.5 (GLOVE) ×4 IMPLANT
GLOVE SUPERSENSE BIOGEL SZ 6.5 (GLOVE) ×8
GOWN STRL REUS W/ TWL LRG LVL3 (GOWN DISPOSABLE) ×2 IMPLANT
GOWN STRL REUS W/ TWL XL LVL3 (GOWN DISPOSABLE) ×3 IMPLANT
GOWN STRL REUS W/TWL 2XL LVL3 (GOWN DISPOSABLE) ×3 IMPLANT
GOWN STRL REUS W/TWL LRG LVL3 (GOWN DISPOSABLE) ×4
GOWN STRL REUS W/TWL XL LVL3 (GOWN DISPOSABLE) ×6
KIT BASIN OR (CUSTOM PROCEDURE TRAY) ×3 IMPLANT
KIT ROOM TURNOVER OR (KITS) ×3 IMPLANT
NEEDLE HYPO 21X1.5 SAFETY (NEEDLE) ×3 IMPLANT
NEEDLE HYPO 22GX1.5 SAFETY (NEEDLE) ×3 IMPLANT
NS IRRIG 1000ML POUR BTL (IV SOLUTION) ×3 IMPLANT
PACK LAMINECTOMY NEURO (CUSTOM PROCEDURE TRAY) ×3 IMPLANT
PAD ARMBOARD 7.5X6 YLW CONV (MISCELLANEOUS) ×9 IMPLANT
PATTIES SURGICAL .75X.75 (GAUZE/BANDAGES/DRESSINGS) IMPLANT
PEEK OPTIMA 12X9X26MM (Cage) ×6 IMPLANT
ROD PREBENT 95 PERC 5.5 (Rod) ×3 IMPLANT
ROD PREBENT PERC 10MM (Rod) ×3 IMPLANT
SCREW MIN INVASIVE 6.5X35 (Screw) ×3 IMPLANT
SCREW MIN INVASIVE 6.5X45 (Screw) ×12 IMPLANT
SCREW PATHFINDER 7.5X35MM (Screw) ×3 IMPLANT
SCREW POLYAXIA MIS 6.5X40MM (Screw) ×6 IMPLANT
SPONGE LAP 4X18 X RAY DECT (DISPOSABLE) IMPLANT
SPONGE SURGIFOAM ABS GEL 100 (HEMOSTASIS) ×3 IMPLANT
STRIP CLOSURE SKIN 1/2X4 (GAUZE/BANDAGES/DRESSINGS) ×2 IMPLANT
SUT PROLENE 0 CT 1 30 (SUTURE) IMPLANT
SUT VIC AB 0 CT1 18XCR BRD8 (SUTURE) ×1 IMPLANT
SUT VIC AB 0 CT1 8-18 (SUTURE) ×2
SUT VIC AB 2-0 OS6 18 (SUTURE) ×9 IMPLANT
SUT VIC AB 3-0 CP2 18 (SUTURE) ×3 IMPLANT
SYR 20CC LL (SYRINGE) ×3 IMPLANT
SYR 20ML ECCENTRIC (SYRINGE) ×3 IMPLANT
TOP CLSR SEQUOIA (Orthopedic Implant) ×24 IMPLANT
TOWEL OR 17X24 6PK STRL BLUE (TOWEL DISPOSABLE) ×3 IMPLANT
TOWEL OR 17X26 10 PK STRL BLUE (TOWEL DISPOSABLE) ×3 IMPLANT
TRAP SPECIMEN MUCOUS 40CC (MISCELLANEOUS) ×3 IMPLANT
TRAY FOLEY CATH 14FRSI W/METER (CATHETERS) ×3 IMPLANT
WATER STERILE IRR 1000ML POUR (IV SOLUTION) ×3 IMPLANT

## 2014-04-10 NOTE — Anesthesia Postprocedure Evaluation (Signed)
  Anesthesia Post-op Note  Patient: Wanda [name]  Procedure(s) Performed: Procedure(s): Lumbar three/four interbody fusion with extension of intsrumentation, lumbar three/four/five-sacral one. Posterior lateral fusion (N/A)  Patient Location: PACU  Anesthesia Type:General  Level of Consciousness: awake and alert   Airway and Oxygen Therapy: Patient Spontanous Breathing  Post-op Pain: none  Post-op Assessment: Post-op Vital signs reviewed, Patient's Cardiovascular Status Stable and Respiratory Function Stable  Post-op Vital Signs: Reviewed  Filed Vitals:   04/10/14 1400  BP: 111/56  Pulse: 93  Temp:   Resp: 17    Complications: No apparent anesthesia complications

## 2014-04-10 NOTE — Transfer of Care (Signed)
Immediate Anesthesia Transfer of Care Note  Patient: Wanda [name]  Procedure(s) Performed: Procedure(s): Lumbar three/four interbody fusion with extension of intsrumentation, lumbar three/four/five-sacral one. Posterior lateral fusion (N/A)  Patient Location: PACU  Anesthesia Type:General  Level of Consciousness: sedated, patient cooperative and responds to stimulation  Airway & Oxygen Therapy: Patient Spontanous Breathing and Patient connected to face mask oxygen  Post-op Assessment: Report given to PACU RN, Post -op Vital signs reviewed and stable and Patient moving all extremities X 4  Post vital signs: Reviewed and stable  Complications: No apparent anesthesia complications

## 2014-04-10 NOTE — Anesthesia Preprocedure Evaluation (Signed)
Anesthesia Evaluation  Patient identified by MRN, date of birth, ID band Patient awake    Reviewed: Allergy & Precautions, H&P , NPO status , Patient's Chart, lab work & pertinent test results, reviewed documented beta blocker date and time   History of Anesthesia Complications (+) PONV and history of anesthetic complications  Airway Mallampati: II TM Distance: >3 FB Neck ROM: Full    Dental no notable dental hx. (+) Teeth Intact, Dental Advisory Given   Pulmonary neg pulmonary ROS, former smoker,  breath sounds clear to auscultation  Pulmonary exam normal       Cardiovascular hypertension, On Medications and On Home Beta Blockers negative cardio ROS  Rhythm:Regular Rate:Normal     Neuro/Psych  Headaches, Anxiety Depression    GI/Hepatic Neg liver ROS, GERD-  Medicated and Controlled,  Endo/Other  negative endocrine ROSMorbid obesity  Renal/GU negative Renal ROS  negative genitourinary   Musculoskeletal   Abdominal   Peds  Hematology negative hematology ROS (+)   Anesthesia Other Findings   Reproductive/Obstetrics negative OB ROS                           Anesthesia Physical Anesthesia Plan  ASA: III  Anesthesia Plan: General   Post-op Pain Management:    Induction: Intravenous  Airway Management Planned: Oral ETT  Additional Equipment:   Intra-op Plan:   Post-operative Plan: Extubation in OR  Informed Consent: I have reviewed the patients History and Physical, chart, labs and discussed the procedure including the risks, benefits and alternatives for the proposed anesthesia with the patient or authorized representative who has indicated his/her understanding and acceptance.   Dental advisory given  Plan Discussed with: CRNA  Anesthesia Plan Comments:         Anesthesia Quick Evaluation

## 2014-04-10 NOTE — Op Note (Signed)
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 Postop diagnosis: Same Procedure: Exploration of fusion L4-5 L5-S1 L 34 bilateral decompressive laminectomy and medial facetectomy for decompression of L3 and L4 nerve roots and relief of central and lateral recess stenosis more so than needed for interbody fusion Bilateral L3-4 microdiscectomy L3-4 posterior lumbar interbody fusion with peek interbody spacer Posterolateral fusion L3-4 L4-5 L5-S1 Segmental instrumentation with Sequoia pedicle screw instrumentation L3-L4 L5-S1 bilaterally Surgeon: Veterinary surgeon: Elsner  After being placed the prone position the patient's back was prepped and draped in the usual sterile fashion. Fluoroscopy was used prior to incision to identify the appropriate levels. Midline incision was made above the spinous processes of L3 L4-L5 and S1. Incision was carried on the spinous processes of L3 and subperiosteal dissection was then carried out bilaterally on the spinous processes lamina facet joint. We then dissected inferiorly to identify the previous instrumentation at L4-L5 and S1. Previous instrumentation was removed and replaced with Pathfinder screws bilaterally at L 4 and L5. At S1 on the left the screw was obviously loose consistent with pseudoarthrosis. It was removed and placed with a much larger screw at that level on that side. We then did a generous decompression at L3-4 bilaterally by removing the inferior 80% of the L3 lamina the medial three quarters of the facet joint the superior residual bone of L4 lamina. Marked hypertrophic ligamentum flavum was removed in a piecemeal fashion. We did generous decompression to decompress the midline and lateral recess stenosis. L3 and L4 nerve roots were both visualized and decompressed more so than needed for interbody fusion. We then entered the disc and thoroughly cleaned out  with a variety of pituitary rongeurs and curettes. Thorough displaced cleanout was carried out while the same time great care was taken to avoid injury to the neural elements this was successfully done. We then prepared the disc for interbody fusion with a 5 instruments. We distracted open to 12 mm size and felt this was a good choice. We chose to 12 x 9 x 26 mm cages trouble with a mixture of autologous bone morselized allograft. We packed the cages out difficulty. Prior to placing the second cage we placed the same mixture deep in the interspace to help with interbody fusion. Then placed pedicle screws bilaterally at L3. We chose drill hole entry points passed the pedicle all tapped with the 6.0 tap and then placed 6.5 x 45 mm screws bilaterally at L3. These were followed in excellent position. We then placed a single pedicle screw on the right at S1 followed into good position under fluoroscopy. We then decorticated the far lateral region at L3 L4-L5 and S1 placed a mixture of autologous bone morselized allograft for posterolateral fusion. We then fashioned rods to the appropriate shape secured to the top of the screws. We did tightening and final tightening with torque and counter torque and final fossae looked good in AP lateral direction. The was then irrigated copiously metabolic irrigation and bleeding control but coagulation Gelfoam. Left an epidural drain in the epidural space and brought out through a separate stab incision. The was then closed in multiple layers of Vicryl on the muscle fascia subcutaneous and subcuticular tissues. We locking Prolene was placed on the skin. Shortness was then applied the patient was extubated to recovery in stable condition.

## 2014-04-10 NOTE — H&P (Signed)
Wanda [name] is an 47 y.o. female.   Chief Complaint: Back and leg pain HPI: The patient is a 48 year old female who is had back fusions at L4-5 and L5-S1. She did well for a number of years, but recently began has developed back pain radiating into the legs. She's tried conservative therapy which gave her no significant improvement. She underwent myelography with posttraumatic CT. The showed a few particular abnormalities. At L5-S1 there could be pseudoarthrosis was some loose and pedicle screw at that level. At L3-4 there was significant adjacent level disease within was complete myelographic block. It was felt that this was her most likely symptomatic problem. After failing additional conservative therapy the options were discussed. The patient requested surgery now she comes for extension of her fusion L3-4 with evaluation of her fusion at L5-S1 with interbody L3-4 and a posterior lateral L5-S1 assuming there is indeed a pseudoarthrosis. I've had a long discussion with her regarding the risks and benefits of surgical intervention. The risks discussed include but are not limited to bleeding infection weakness numbness paralysis trouble with instrumentation nonunion spinal fluid leakage coma and death. We have discussed alternative methods of therapy although risks and benefits of nonintervention. She's had the opportunity to ask numerous questions and appears to understand. With this information in hand she has requested we proceed with surgery and she'll be admitted this time for her procedure.  Past Medical History  Diagnosis Date  . Depression   . Fatigue   . TMJ pain dysfunction syndrome   . Obesity   . Sinusitis   . Hypertension   . Hypercholesterolemia   . Transient ischemic attack (TIA)     left sided weakness  . PONV (postoperative nausea and vomiting)   . Dizziness   . Anxiety   . GERD (gastroesophageal reflux disease)   . Chronic headache     migraines  . Arthritis   .  History of blood transfusion     Past Surgical History  Procedure Laterality Date  . Back surgery      x 5  . Torn disk l4 l5 s1      surgery by Trevaughn Schear 9/11 +10/11  . Nasal sinus surgery    . Vaginal hysterectomy      for DUB in 10/2003  . Cholecystectomy      11/19/09  . Gallbladder surgery    . Knee arthroscopy  02/2000  . Abdominal hysterectomy    . Left carpel tunnel      Family History  Problem Relation Age of Onset  . Cervical cancer Maternal Grandmother   . Cancer Maternal Grandmother     mouth  . Hypertension Brother   . Heart failure Brother     ICD  . Heart attack Maternal Grandmother 55   Social History:  reports that she quit smoking about 10 years ago. Her smoking use included Cigarettes. She has a 10 pack-year smoking history. She does not have any smokeless tobacco history on file. She reports that she does not drink alcohol or use illicit drugs.  Allergies:  Allergies  Allergen Reactions  . Lisinopril Swelling and Other (See Comments)    Makes faces, lips and neck swell.  . Oxycodone-Acetaminophen Itching and Other (See Comments)    Patient states she is allergic to the inactive ingredient  . Tape Rash  . Topamax [Topiramate] Other (See Comments)    Short term memory loss.      Medications Prior to Admission  Medication Sig Dispense  Refill  . aspirin 325 MG tablet Take 325 mg by mouth daily.      Marland Kitchen atenolol (TENORMIN) 25 MG tablet Take 25 mg by mouth daily.       . baclofen (LIORESAL) 10 MG tablet Take 5-10 mg by mouth every 8 (eight) hours as needed for muscle spasms.       . fluticasone (FLONASE) 50 MCG/ACT nasal spray Place 2 sprays into both nostrils daily.       . hydrochlorothiazide (MICROZIDE) 12.5 MG capsule Take 12.5 mg by mouth daily.      Marland Kitchen neomycin-bacitracin-polymyxin (NEOSPORIN) [phone] ointment Apply 1 application topically daily as needed (rash under chin).      . nortriptyline (PAMELOR) 50 MG capsule Take 50 mg by mouth at bedtime.       Marland Kitchen omeprazole (PRILOSEC) 20 MG capsule Take 20 mg by mouth 2 (two) times daily before a meal.       . promethazine (PHENERGAN) 25 MG tablet Take 25 mg by mouth every 6 (six) hours as needed for nausea.      . sertraline (ZOLOFT) 100 MG tablet Take 100-200 mg by mouth 2 (two) times daily. Take 100 mg in the am and 200 mg in the pm      . tizanidine (ZANAFLEX) 2 MG capsule Take 2 mg by mouth every 8 (eight) hours as needed (headaches).       . traMADol (ULTRAM) 50 MG tablet Take 50 mg by mouth every 6 (six) hours as needed for moderate pain.       . Vitamin D, Ergocalciferol, (DRISDOL) 50000 UNITS CAPS Take 50,000 Units by mouth every 7 (seven) days. mondays      . zonisamide (ZONEGRAN) 50 MG capsule Take 150 mg by mouth 2 (two) times daily.       . metroNIDAZOLE (METROGEL) 0.75 % vaginal gel Place 1 Applicatorful vaginally 2 (two) times a week.         No results found for this or any previous visit (from the past 48 hour(s)). No results found.  Review of systems not obtained due to patient factors.  Blood pressure 114/47, pulse 74, temperature 98 F (36.7 C), temperature source Oral, resp. rate 20, height 5\' 6"  (1.676 m), weight 103.42 kg (228 lb), SpO2 97.00%.  The patient is awake alert and oriented. She is no facial asymmetry. Her gait is mildly antalgic. She walks with a cane. Reflexes are decreased but equal her strength is intact in the visual testing Assessment/Plan Impression is that of adjacent level disease at L3-4. The plan is for extension of her fusion and evaluation of her previous fusions.  Faythe Ghee, MD 04/10/2014, 7:25 AM

## 2014-04-11 MED ORDER — PANTOPRAZOLE SODIUM 40 MG PO TBEC
40.0000 mg | DELAYED_RELEASE_TABLET | Freq: Every day | ORAL | Status: DC
  Administered 2014-04-11 – 2014-04-16 (×6): 40 mg via ORAL
  Filled 2014-04-11 (×6): qty 1

## 2014-04-11 MED FILL — Sodium Chloride IV Soln 0.9%: INTRAVENOUS | Qty: 1000 | Status: AC

## 2014-04-11 MED FILL — Sodium Chloride Irrigation Soln 0.9%: Qty: 3000 | Status: AC

## 2014-04-11 MED FILL — Heparin Sodium (Porcine) Inj 1000 Unit/ML: INTRAMUSCULAR | Qty: 30 | Status: AC

## 2014-04-11 NOTE — Progress Notes (Signed)
UR completed.  Jalyah Weinheimer, RN BSN [facility] CCM Trauma/Neuro ICU Case Manager [phone]  

## 2014-04-11 NOTE — Progress Notes (Signed)
Patient ID: Wanda [name], female   DOB: [birth year], 48 y.o.   MRN: [id number] Afeb, vss No new neuro issues. Legs feel better. Lots of incisional pain. Will slowly increase activity,and take things a day at a time, but she actually looks pretty comfortable at this time cionsidering the large procedure that she had.

## 2014-04-12 NOTE — Progress Notes (Signed)
Patient ID: Wanda [name], female   DOB: January 19, 1966, 48 y.o.   MRN: [id number] Afeb, vss No new neuro issues. Slowly increasing activity. Wound clean and dry. Will increase activity, home next 1-2 days. Legs definitely feel better.

## 2014-04-13 NOTE — Progress Notes (Signed)
No issues overnight. Pt ambulating with rolling walker. Does continue to have appropriate back pain.  EXAM:  BP 98/44  Pulse 112  Temp(Src) 99.3 F (37.4 C) (Oral)  Resp 20  Ht 5\' 6"  (1.676 m)  Wt 103.42 kg (228 lb)  BMI 36.82 kg/m2  SpO2 98%  Awake, alert, oriented  Speech fluent, appropriate  CN grossly intact  5/5 BUE/BLE   IMPRESSION:  48 y.o. female POD#3 s/p extension of previous fusion to L3-4, recovering as expected  PLAN: - Cont to mobilize - Current pain control appears adequate

## 2014-04-14 MED ORDER — POLYETHYLENE GLYCOL 3350 17 G PO PACK
17.0000 g | PACK | Freq: Every day | ORAL | Status: DC
  Administered 2014-04-14 – 2014-04-15 (×2): 17 g via ORAL
  Filled 2014-04-14 (×3): qty 1

## 2014-04-14 NOTE — Progress Notes (Signed)
No issues overnight. Pt ambulating with rolling walker. Pain is improving.  EXAM:  BP 91/24  Pulse 113  Temp(Src) 99.3 F (37.4 C) (Oral)  Resp 20  Ht 5\' 6"  (1.676 m)  Wt 103.42 kg (228 lb)  BMI 36.82 kg/m2  SpO2 91%  Awake, alert, oriented  Speech fluent, appropriate  CN grossly intact  5/5 BUE/BLE  Wound c/d/i  IMPRESSION:  48 y.o. female POD#4 s/p extension of previous fusion to L3-4, recovering as expected  PLAN: - Cont to mobilize - Home tomorrow likely

## 2014-04-15 NOTE — Progress Notes (Signed)
Patient ID: Wanda [name], female   DOB: 1966/03/29, 48 y.o.   MRN: [id number] Much better. Pain under control. Wound dry

## 2014-04-16 MED ORDER — HYDROMORPHONE HCL 4 MG PO TABS: 4.0000 mg | ORAL_TABLET | ORAL | Status: DC | PRN

## 2014-04-16 NOTE — Care Management Note (Addendum)
  Page 1 of 1   04/16/2014     10:51:58 AM CARE MANAGEMENT NOTE 04/16/2014  Patient:  Wanda [name], Wanda [name]   Account Number:  [id number]  Date Initiated:  04/16/2014  Documentation initiated by:  Lorne Skeens  Subjective/Objective Assessment:   Patient was admitted with stenosis. Underwent surgery this admission. Lives at home alone.     Action/Plan:   Will follow for discharge needs pending physician orders.   Anticipated DC Date:  04/16/2014   Anticipated DC Plan:  [place]  CM consult      Choice offered to / List presented to:     DME arranged  3-N-1      DME agency  [place].        Status of service:  Completed, signed off Medicare Important Message given?   (If response is "NO", the following Medicare IM given date fields will be blank) Date Medicare IM given:   Medicare IM given by:   Date Additional Medicare IM given:   Additional Medicare IM given by:    Discharge Disposition:  HOME/SELF CARE  Per UR Regulation:  Reviewed for med. necessity/level of care/duration of stay  If discussed at [place] of Stay Meetings, dates discussed:    Comments:  04/16/14 [place], MSN, [place] DME notified of need for 3N1 for discharge home today.

## 2014-04-16 NOTE — Discharge Summary (Signed)
Physician Discharge Summary  Patient ID: Wanda [name] MRN: [id number] DOB/AGE: Feb 25, 1966 48 y.o.  Admit date: 04/10/2014 Discharge date: 04/16/2014  Admission Diagnoses:  Discharge Diagnoses:  Active Problems:   Lumbar spinal stenosis   Discharged Condition: good  Hospital Course: Surgery 6 days ago for fusion extension. Did well Steadily increased activity. Wound healed well. Pain much better. Home pod 6, specific instructions given.  Consults: None  Significant Diagnostic Studies: none  Treatments: surgery: L 34 plif with extension of instrumentation  Discharge Exam: Blood pressure 108/56, pulse 91, temperature 98.5 F (36.9 C), temperature source Oral, resp. rate 18, height 5\' 6"  (1.676 m), weight 103.42 kg (228 lb), SpO2 96.00%. Incision/Wound:clean and dry; no new neuro issues  Disposition: 01-Home or Self Care     Medication List    ASK your doctor about these medications       aspirin 325 MG tablet  Take 325 mg by mouth daily.     atenolol 25 MG tablet  Commonly known as:  TENORMIN  Take 25 mg by mouth daily.     baclofen 10 MG tablet  Commonly known as:  LIORESAL  Take 5-10 mg by mouth every 8 (eight) hours as needed for muscle spasms.     fluticasone 50 MCG/ACT nasal spray  Commonly known as:  FLONASE  Place 2 sprays into both nostrils daily.     hydrochlorothiazide 12.5 MG capsule  Commonly known as:  MICROZIDE  Take 12.5 mg by mouth daily.     metroNIDAZOLE 0.75 % vaginal gel  Commonly known as:  METROGEL  Place 1 Applicatorful vaginally 2 (two) times a week.     neomycin-bacitracin-polymyxin [phone] ointment  Apply 1 application topically daily as needed (rash under chin).     nortriptyline 50 MG capsule  Commonly known as:  PAMELOR  Take 50 mg by mouth at bedtime.     omeprazole 20 MG capsule  Commonly known as:  PRILOSEC  Take 20 mg by mouth 2 (two) times daily before a meal.     promethazine 25 MG tablet  Commonly  known as:  PHENERGAN  Take 25 mg by mouth every 6 (six) hours as needed for nausea.     sertraline 100 MG tablet  Commonly known as:  ZOLOFT  Take 100-200 mg by mouth 2 (two) times daily. Take 100 mg in the am and 200 mg in the pm     tizanidine 2 MG capsule  Commonly known as:  ZANAFLEX  Take 2 mg by mouth every 8 (eight) hours as needed (headaches).     ULTRAM 50 MG tablet  Generic drug:  traMADol  Take 50 mg by mouth every 6 (six) hours as needed for moderate pain.     Vitamin D (Ergocalciferol) 50000 UNITS Caps capsule  Commonly known as:  DRISDOL  Take 50,000 Units by mouth every 7 (seven) days. mondays     zonisamide 50 MG capsule  Commonly known as:  ZONEGRAN  Take 150 mg by mouth 2 (two) times daily.         At home rest most of the time. Get up 9 or 10 times each day and take a 15 or 20 minute walk. No riding in the car and to your first postoperative appointment. If you have neck surgery you may shower from the chest down starting on the third postoperative day. If you had back surgery he may start showering on the third postoperative day with saran wrap wrapped around your  incisional area 3 times. After the shower remove the saran wrap. Take pain medicine as needed and other medications as instructed. Call my office for an appointment.  SignedFaythe Ghee, MD 04/16/2014, 8:44 AM

## 2014-04-16 NOTE — Progress Notes (Signed)
1130 - discharge instructions and prescription given to pt. Pt verbally acknowledged understanding. Pt voiced no questions when prompted. Pt has called for family member for transport to home by private car. Will monitor  Angeline Slim I 04/16/2014 11:49 AM

## 2014-08-21 ENCOUNTER — Emergency Department (HOSPITAL_COMMUNITY)
Admission: EM | Admit: 2014-08-21 | Discharge: 2014-08-21 | Disposition: A | Discharge status: Home / Self Care | Payer: BLUE CROSS/BLUE SHIELD | Attending: Emergency Medicine | Admitting: Emergency Medicine

## 2014-08-21 ENCOUNTER — Encounter (HOSPITAL_COMMUNITY): Payer: Self-pay | Admitting: *Deleted

## 2014-08-21 DIAGNOSIS — Z91040 Latex allergy status: Secondary | ICD-10-CM | POA: Insufficient documentation

## 2014-08-21 DIAGNOSIS — F329 Major depressive disorder, single episode, unspecified: Secondary | ICD-10-CM | POA: Insufficient documentation

## 2014-08-21 DIAGNOSIS — E669 Obesity, unspecified: Secondary | ICD-10-CM | POA: Diagnosis not present

## 2014-08-21 DIAGNOSIS — G8929 Other chronic pain: Secondary | ICD-10-CM | POA: Insufficient documentation

## 2014-08-21 DIAGNOSIS — R51 Headache: Secondary | ICD-10-CM

## 2014-08-21 DIAGNOSIS — R531 Weakness: Secondary | ICD-10-CM | POA: Insufficient documentation

## 2014-08-21 DIAGNOSIS — Z8709 Personal history of other diseases of the respiratory system: Secondary | ICD-10-CM | POA: Insufficient documentation

## 2014-08-21 DIAGNOSIS — Z8673 Personal history of transient ischemic attack (TIA), and cerebral infarction without residual deficits: Secondary | ICD-10-CM | POA: Insufficient documentation

## 2014-08-21 DIAGNOSIS — Z87891 Personal history of nicotine dependence: Secondary | ICD-10-CM | POA: Insufficient documentation

## 2014-08-21 DIAGNOSIS — F419 Anxiety disorder, unspecified: Secondary | ICD-10-CM | POA: Diagnosis not present

## 2014-08-21 DIAGNOSIS — H53149 Visual discomfort, unspecified: Secondary | ICD-10-CM | POA: Diagnosis not present

## 2014-08-21 DIAGNOSIS — K219 Gastro-esophageal reflux disease without esophagitis: Secondary | ICD-10-CM | POA: Insufficient documentation

## 2014-08-21 DIAGNOSIS — Z7951 Long term (current) use of inhaled steroids: Secondary | ICD-10-CM | POA: Insufficient documentation

## 2014-08-21 DIAGNOSIS — M1990 Unspecified osteoarthritis, unspecified site: Secondary | ICD-10-CM | POA: Insufficient documentation

## 2014-08-21 DIAGNOSIS — G43909 Migraine, unspecified, not intractable, without status migrainosus: Secondary | ICD-10-CM | POA: Insufficient documentation

## 2014-08-21 DIAGNOSIS — Z79899 Other long term (current) drug therapy: Secondary | ICD-10-CM | POA: Insufficient documentation

## 2014-08-21 DIAGNOSIS — R519 Headache, unspecified: Secondary | ICD-10-CM

## 2014-08-21 DIAGNOSIS — I10 Essential (primary) hypertension: Secondary | ICD-10-CM | POA: Diagnosis not present

## 2014-08-21 MED ORDER — METOCLOPRAMIDE HCL 5 MG/ML IJ SOLN
10.0000 mg | Freq: Once | INTRAMUSCULAR | Status: AC
Start: 2014-08-21 — End: 2014-08-21
  Administered 2014-08-21: 10 mg via INTRAMUSCULAR
  Filled 2014-08-21: qty 2

## 2014-08-21 MED ORDER — DIPHENHYDRAMINE HCL 50 MG/ML IJ SOLN
25.0000 mg | Freq: Once | INTRAMUSCULAR | Status: AC
  Administered 2014-08-21: 25 mg via INTRAMUSCULAR
  Filled 2014-08-21: qty 1

## 2014-08-21 NOTE — ED Notes (Addendum)
Pt reports onset of migraine this am, pain behind left eye and nausea. Has hx of migraine. Reports sensitivity to light and sound.

## 2014-08-21 NOTE — Discharge Instructions (Signed)
Please read and follow all provided instructions.  Your diagnoses today include:  1. Acute nonintractable headache, unspecified headache type    Tests performed today include:  Vital signs. See below for your results today.   Medications:  In the Emergency Department you received:  Reglan - antinausea/headache medication  Benadryl - antihistamine to counteract potential side effects of reglan  Take any prescribed medications only as directed.  Additional information:  Follow any educational materials contained in this packet.  You are having a headache. No specific cause was found today for your headache. It may have been a migraine or other cause of headache. Stress, anxiety, fatigue, and depression are common triggers for headaches.   Your headache today does not appear to be life-threatening or require hospitalization, but often the exact cause of headaches is not determined in the emergency department. Therefore, follow-up with your doctor is very important to find out what may have caused your headache and whether or not you need any further diagnostic testing or treatment.   Sometimes headaches can appear benign (not harmful), but then more serious symptoms can develop which should prompt an immediate re-evaluation by your doctor or the emergency department.  BE VERY CAREFUL not to take multiple medicines containing Tylenol (also called acetaminophen). Doing so can lead to an overdose which can damage your liver and cause liver failure and possibly death.   Follow-up instructions: Please follow-up with your primary care provider in the next 3 days for further evaluation of your symptoms.   Return instructions:   Please return to the Emergency Department if you experience worsening symptoms.  Return if the medications do not resolve your headache, if it recurs, or if you have multiple episodes of vomiting or cannot keep down fluids.  Return if you have a change from the  usual headache.  RETURN IMMEDIATELY IF you:  Develop a sudden, severe headache  Develop confusion or become poorly responsive or faint  Develop a fever above 100.15F or problem breathing  Have a change in speech, vision, swallowing, or understanding  Develop new weakness, numbness, tingling, incoordination in your arms or legs  Have a seizure  Please return if you have any other emergent concerns.  Additional Information:  Your vital signs today were: BP 96/40 mmHg   Pulse 84   Temp(Src) 98.1 F (36.7 C) (Oral)   Resp 18   Ht 5\' 6"  (1.676 m)   Wt 231 lb (104.781 kg)   BMI 37.30 kg/m2   SpO2 98% If your blood pressure (BP) was elevated above 135/85 this visit, please have this repeated by your doctor within one month. --------------

## 2014-08-21 NOTE — ED Provider Notes (Signed)
CSN: [id number]     Arrival date & time 08/21/14  1620 History   First MD Initiated Contact with Patient 08/21/14 1859     Chief Complaint  Patient presents with  . Migraine   (Consider location/radiation/quality/duration/timing/severity/associated sxs/prior Treatment) HPI Comments: Patients with prior history of migraine headaches, multiple back surgeries, TIA, baseline left-sided weakness -- presents with complaint of migraine headache starting early this morning at approximately 2 AM. Pain began gradually and worsened during the day. Patient took Zanaflex 2 without relief of symptoms. She is sensitive to light and sound. Pain is left-sided and throbbing with radiation to her neck. Patient has had all of these symptoms in the past and this headache today is the same as her previous headache. She denies head injury. She denies fever or pain with movement of her neck. No blurry vision, worsening weakness, trouble walking or talking. No numbness or tingling in her extremities. No chest pain or shortness of breath.   The history is provided by the patient and medical records.    Past Medical History  Diagnosis Date  . Depression   . Fatigue   . TMJ pain dysfunction syndrome   . Obesity   . Sinusitis   . Hypertension   . Hypercholesterolemia   . Transient ischemic attack (TIA)     left sided weakness  . PONV (postoperative nausea and vomiting)   . Dizziness   . Anxiety   . GERD (gastroesophageal reflux disease)   . Chronic headache     migraines  . Arthritis   . History of blood transfusion    Past Surgical History  Procedure Laterality Date  . Back surgery      x 5  . Torn disk l4 l5 s1      surgery by Kritzer 9/11 +10/11  . Nasal sinus surgery    . Vaginal hysterectomy      for DUB in 10/2003  . Cholecystectomy      11/19/09  . Gallbladder surgery    . Knee arthroscopy  02/2000  . Abdominal hysterectomy    . Left carpel tunnel     Family History  Problem Relation Age of  Onset  . Cervical cancer Maternal Grandmother   . Cancer Maternal Grandmother     mouth  . Hypertension Brother   . Heart failure Brother     ICD  . Heart attack Maternal Grandmother 55   History  Substance Use Topics  . Smoking status: Former Smoker -- 0.50 packs/day for 20 years    Types: Cigarettes    Quit date: 07/28/2003  . Smokeless tobacco: Not on file     Comment: 3/4 of pack  . Alcohol Use: No   OB History    No data available     Review of Systems  Constitutional: Negative for fever.  HENT: Negative for congestion, dental problem, rhinorrhea and sinus pressure.   Eyes: Positive for photophobia. Negative for discharge, redness and visual disturbance.  Respiratory: Negative for shortness of breath.   Cardiovascular: Negative for chest pain.  Gastrointestinal: Negative for nausea and vomiting.  Musculoskeletal: Negative for gait problem, neck pain and neck stiffness.  Skin: Negative for rash.  Neurological: Positive for weakness (baseline) and headaches. Negative for syncope, speech difficulty, light-headedness and numbness.  Psychiatric/Behavioral: Negative for confusion.    Allergies  Latex; Lisinopril; Oxycodone-acetaminophen; Tape; and Topamax  Home Medications   Prior to Admission medications   Medication Sig Start Date End Date Taking? Authorizing Provider  atenolol (TENORMIN) 25 MG tablet Take 25 mg by mouth daily.  02/19/14  Yes Historical Provider, MD  baclofen (LIORESAL) 10 MG tablet Take 5-10 mg by mouth every 8 (eight) hours as needed for muscle spasms.    Yes Historical Provider, MD  fluticasone (FLONASE) 50 MCG/ACT nasal spray Place 2 sprays into both nostrils daily.  12/19/13  Yes Historical Provider, MD  hydrochlorothiazide (MICROZIDE) 12.5 MG capsule Take 12.5 mg by mouth daily.   Yes Historical Provider, MD  metroNIDAZOLE (METROGEL) 0.75 % vaginal gel Place 1 Applicatorful vaginally 2 (two) times daily as needed (itching).    Yes Historical  Provider, MD  neomycin-bacitracin-polymyxin (NEOSPORIN) [phone] ointment Apply 1 application topically daily as needed (rash under chin).   Yes Historical Provider, MD  nortriptyline (PAMELOR) 50 MG capsule Take 50 mg by mouth at bedtime.   Yes Historical Provider, MD  omeprazole (PRILOSEC) 20 MG capsule Take 20 mg by mouth 2 (two) times daily before a meal.    Yes Historical Provider, MD  promethazine (PHENERGAN) 25 MG tablet Take 25 mg by mouth every 6 (six) hours as needed for nausea.   Yes Historical Provider, MD  sertraline (ZOLOFT) 100 MG tablet Take 100-200 mg by mouth 2 (two) times daily. Take 100 mg in the am and 200 mg in the pm   Yes Historical Provider, MD  tizanidine (ZANAFLEX) 2 MG capsule Take 2 mg by mouth every 8 (eight) hours as needed (headaches).    Yes Historical Provider, MD  Vitamin D, Ergocalciferol, (DRISDOL) 50000 UNITS CAPS Take 50,000 Units by mouth every 7 (seven) days. mondays   Yes Historical Provider, MD  zonisamide (ZONEGRAN) 50 MG capsule Take 150 mg by mouth 2 (two) times daily.  12/18/13  Yes Historical Provider, MD  HYDROmorphone (DILAUDID) 4 MG tablet Take 1 tablet (4 mg total) by mouth every 4 (four) hours as needed for severe pain. Patient not taking: Reported on 08/21/2014 04/16/14   Olga Coaster Kritzer, MD   BP 119/60 mmHg  Pulse 82  Temp(Src) 98.1 F (36.7 C) (Oral)  Resp 21  Ht 5\' 6"  (1.676 m)  Wt 231 lb (104.781 kg)  BMI 37.30 kg/m2  SpO2 100%   Physical Exam  Constitutional: She is oriented to person, place, and time. She appears well-developed and well-nourished.  HENT:  Head: Normocephalic and atraumatic.  Right Ear: Tympanic membrane, external ear and ear canal normal.  Left Ear: Tympanic membrane, external ear and ear canal normal.  Nose: Nose normal.  Mouth/Throat: Uvula is midline, oropharynx is clear and moist and mucous membranes are normal.  Eyes: Conjunctivae, EOM and lids are normal. Pupils are equal, round, and reactive to light.  Right eye exhibits no nystagmus. Left eye exhibits no nystagmus.  Neck: Normal range of motion. Neck supple.  Cardiovascular: Normal rate and regular rhythm.   Pulmonary/Chest: Effort normal and breath sounds normal.  Abdominal: Soft. There is no tenderness.  Musculoskeletal:       Cervical back: She exhibits normal range of motion, no tenderness and no bony tenderness.  Neurological: She is alert and oriented to person, place, and time. She has normal strength and normal reflexes. No cranial nerve deficit or sensory deficit. She displays a negative Romberg sign. Coordination and gait normal. GCS eye subscore is 4. GCS verbal subscore is 5. GCS motor subscore is 6.  Skin: Skin is warm and dry.  Psychiatric: She has a normal mood and affect.  Nursing note and vitals reviewed.   ED  Course  Procedures (including critical care time) Labs Review Labs Reviewed - No data to display  Imaging Review No results found.   EKG Interpretation None       7:55 PM Patient seen and examined. Work-up initiated. Medications ordered. Patient provided with ginger ale.   Vital signs reviewed and are as follows: BP 119/60 mmHg  Pulse 82  Temp(Src) 98.1 F (36.7 C) (Oral)  Resp 21  Ht 5\' 6"  (1.676 m)  Wt 231 lb (104.781 kg)  BMI 37.30 kg/m2  SpO2 100%  9:09 PM patient reports feeling a bit better. She is requesting discharge to home. She will go home and rest. Patient counseled to return if they have weakness in their arms or legs, slurred speech, trouble walking or talking, confusion, trouble with their balance, or if they have any other concerns. Patient verbalizes understanding and agrees with plan.    MDM   Final diagnoses:  Acute nonintractable headache, unspecified headache type   Patient with history of headache, similar to previous. Patient without high-risk features of headache including: sudden onset/thunderclap HA, no similar headache in past, altered mental status, accompanying  seizure, headache with exertion, history of immunocompromise, neck or shoulder pain, fever, use of anticoagulation, family history of spontaneous SAH, concomitant drug use, toxic exposure.   Patient has a normal complete neurological exam, normal vital signs, normal level of consciousness, no signs of meningismus, is well-appearing/non-toxic appearing, no signs of trauma, no pain over the temporal arteries.   Imaging with CT/MRI not indicated given history and physical exam findings.   No dangerous or life-threatening conditions suspected or identified by history, physical exam, and by work-up. No indications for hospitalization identified.      Carlisle Cater, PA-C 08/21/14 2110  Quintella Reichert, MD 08/22/14 [phone]

## 2014-08-21 NOTE — ED Notes (Signed)
Vitals, height and weight taken by Crystal, Phelb

## 2014-09-14 ENCOUNTER — Other Ambulatory Visit: Payer: Self-pay | Admitting: Orthopedic Surgery

## 2014-10-04 ENCOUNTER — Encounter (HOSPITAL_COMMUNITY): Payer: Self-pay

## 2014-10-04 ENCOUNTER — Encounter (HOSPITAL_COMMUNITY)
Admission: RE | Admit: 2014-10-04 | Discharge: 2014-10-04 | Disposition: A | Discharge status: Home / Self Care | Payer: BLUE CROSS/BLUE SHIELD | Source: Ambulatory Visit | Attending: Orthopedic Surgery | Admitting: Orthopedic Surgery

## 2014-10-04 DIAGNOSIS — Z01812 Encounter for preprocedural laboratory examination: Secondary | ICD-10-CM | POA: Insufficient documentation

## 2014-10-04 DIAGNOSIS — Z01818 Encounter for other preprocedural examination: Secondary | ICD-10-CM | POA: Insufficient documentation

## 2014-10-04 DIAGNOSIS — R9431 Abnormal electrocardiogram [ECG] [EKG]: Secondary | ICD-10-CM | POA: Diagnosis not present

## 2014-10-04 DIAGNOSIS — Z01810 Encounter for preprocedural cardiovascular examination: Secondary | ICD-10-CM | POA: Diagnosis not present

## 2014-10-04 DIAGNOSIS — I10 Essential (primary) hypertension: Secondary | ICD-10-CM | POA: Diagnosis not present

## 2014-10-04 HISTORY — DX: Unspecified asthma, uncomplicated: J45.909

## 2014-10-04 HISTORY — DX: Cerebral infarction, unspecified: I63.9

## 2014-10-04 HISTORY — DX: Family history of other specified conditions: Z84.89

## 2014-10-04 LAB — URINALYSIS, ROUTINE W REFLEX MICROSCOPIC
BILIRUBIN URINE: NEGATIVE
GLUCOSE, UA: NEGATIVE mg/dL
HGB URINE DIPSTICK: NEGATIVE
KETONES UR: NEGATIVE mg/dL
Leukocytes, UA: NEGATIVE
NITRITE: NEGATIVE
Protein, ur: NEGATIVE mg/dL
SPECIFIC GRAVITY, URINE: 1.017 (ref 1.005–1.030)
Urobilinogen, UA: 0.2 mg/dL (ref 0.0–1.0)
pH: 6 (ref 5.0–8.0)

## 2014-10-04 LAB — BASIC METABOLIC PANEL
Anion gap: 10 (ref 5–15)
BUN: 12 mg/dL (ref 6–23)
CO2: 24 mmol/L (ref 19–32)
Calcium: 9.3 mg/dL (ref 8.4–10.5)
Chloride: 105 mmol/L (ref 96–112)
Creatinine, Ser: 0.77 mg/dL (ref 0.50–1.10)
GFR calc Af Amer: >90 mL/min (ref >90)
GFR calc non Af Amer: >90 mL/min (ref >90)
GLUCOSE: 92 mg/dL (ref 70–99)
Potassium: 3.5 mmol/L (ref 3.5–5.1)
SODIUM: 139 mmol/L (ref 135–145)

## 2014-10-04 LAB — CBC WITH DIFFERENTIAL/PLATELET
Basophils Absolute: 0.1 10*3/uL (ref 0.0–0.1)
Basophils Relative: 1 % (ref 0–1)
Eosinophils Absolute: 0.3 10*3/uL (ref 0.0–0.7)
Eosinophils Relative: 3 % (ref 0–5)
HCT: 30.7 % — ABNORMAL LOW (ref 36.0–46.0)
Hemoglobin: 9.1 g/dL — ABNORMAL LOW (ref 12.0–15.0)
Lymphocytes Relative: 26 % (ref 12–46)
Lymphs Abs: 2.9 10*3/uL (ref 0.7–4.0)
MCH: 19.2 pg — ABNORMAL LOW (ref 26.0–34.0)
MCHC: 29.6 g/dL — ABNORMAL LOW (ref 30.0–36.0)
MCV: 64.9 fL — ABNORMAL LOW (ref 78.0–100.0)
Monocytes Absolute: 0.7 10*3/uL (ref 0.1–1.0)
Monocytes Relative: 6 % (ref 3–12)
Neutro Abs: 7 10*3/uL (ref 1.7–7.7)
Neutrophils Relative %: 64 % (ref 43–77)
Platelets: 320 10*3/uL (ref 150–400)
RBC: 4.73 MIL/uL (ref 3.87–5.11)
RDW: 20.8 % — ABNORMAL HIGH (ref 11.5–15.5)
WBC: 11 10*3/uL — ABNORMAL HIGH (ref 4.0–10.5)

## 2014-10-04 LAB — SURGICAL PCR SCREEN
MRSA, PCR: NEGATIVE
Staphylococcus aureus: POSITIVE — AB

## 2014-10-04 LAB — PROTIME-INR
INR: 1.08 (ref 0.00–1.49)
PROTHROMBIN TIME: 14.1 s (ref 11.6–15.2)

## 2014-10-04 LAB — APTT: aPTT: 28 seconds (ref 24–37)

## 2014-10-04 NOTE — Pre-Procedure Instructions (Signed)
Wanda [name]  10/04/2014   Your procedure is scheduled on:  Monday, March 21.  Report to [facility] Admitting at 10:20AM.   Call this number if you have problems the morning of surgery: [phone]                For any other questions, please call [phone], Monday - Friday 8 AM - 4 PM.   Remember:   Do not eat food or drink liquids after midnight Sunday, March 20.   Take these medicines the morning of surgery with A SIP OF WATER: atenolol (TENORMIN), omeprazole (PRILOSEC),              sertraline (ZOLOFT), zonisamide (ZONEGRAN).               Take if needed: tizanidine (ZANAFLEX), promethazine (PHENERGAN).                Stop taking Aspirin on Monday, March 14.   Do not wear jewelry, make-up or nail polish.  Do not wear lotions, powders, or cologne.             Do not shave 48 hours prior to surgery.   Do not bring valuables to the hospital.              [facility] is not responsible  for any belongings or valuables.               Contacts, dentures or bridgework may not be worn into surgery.  Leave suitcase in the car. After surgery it may be brought to your room.  For patients admitted to the hospital, discharge time is determined by your treatment team.                Special Instructions: Review  [place] - Preparing For Surgery.   Please read over the following fact sheets that you were given: Pain Booklet, Coughing and Deep Breathing and Surgical Site Infection Prevention and Incentive Spirometry.

## 2014-10-04 NOTE — Progress Notes (Signed)
Wanda [name] has seen cardiologist Dr Bishop Limbo - notes are on the chart.  I have faxed request to [facility] for echo, and stress results.  Sleep study at [facility] sleep was neg- I faxed request.  Patient is scheduled to see PCP Dr Lajuan Lines tomorrow.

## 2014-10-05 ENCOUNTER — Other Ambulatory Visit: Payer: Self-pay | Admitting: Orthopedic Surgery

## 2014-10-05 ENCOUNTER — Encounter (HOSPITAL_COMMUNITY): Payer: Self-pay | Admitting: Emergency Medicine

## 2014-10-05 ENCOUNTER — Encounter (HOSPITAL_COMMUNITY): Payer: Self-pay | Admitting: Anesthesiology

## 2014-10-05 NOTE — Progress Notes (Signed)
I called a prescription for Mupirocin ointment to [facility] , [place], [place], [place]

## 2014-10-05 NOTE — Progress Notes (Signed)
I called and spoke with Sandi Raveling at Dr Damita Dunnings office and notified her of Hbg of 9.1, and Polychromagia Tear drop cells. Juliann Pulse reported that [place], [place] spoke with her this am and he said that it did not need further follow up at this time, but that she would probably need a transfusion after surgery.  I  Informed Juliann Pulse that she had not ordered a T/S, she said she would put it in now.

## 2014-10-05 NOTE — Progress Notes (Addendum)
Anesthesia Chart Review:  Pt is 49 year old female scheduled for L total knee arthroplasty on 10/15/2014 with Dr. Mayer Camel.   PMH includes: HTN, TIA, hypercholesterolemia, asthma, migraines. S/p L 3-4 interbody fusion 04/10/2014. Former smoker. BMI 39  Preoperative labs reviewed.  H/H 9.1/30.7. PAT RN called and spoke with Juliann Pulse in Dr. Damita Dunnings office about anemia. Randall Hiss, PA at this office spoke with Juliann Pulse and said no further follow up needed at this time and that pt would likely need a transfusion post-op. T&S ordered.   Chest x-ray reviewed. No active cardiopulmonary disease.   EKG: NSR. Nonspecific ST abnormality  Echo 05/03/2013:  - Left ventricle: The cavity size was normal. Wall thickness was increased in a pattern of mild LVH. Systolic function was normal. The estimated ejection fraction was in the range of 60% to 65%. Wall motion was normal; there were no regional wall motion abnormalities. - Atrial septum: No defect or patent foramen ovale was identified.  Carotid doppler US 10/29/2011: -40-59% B ICA stenosis  Discussed low hgb with Dr. Linna Caprice. Pt will need medical clearance prior to surgery. Left voicemail with this information for [facility] in Dr. Damita Dunnings office.   Willeen Cass, FNP-BC [facility]/Anesthesiology Phone: [phone] 10/05/2014 5:13 PM  Addendum: Juliann Pulse from Dr. Damita Dunnings office called.  A request for medical clearance was faxed to Dr. Lajuan Lines, but is still pending. She reports Dr. Lajuan Lines did give permission to hold ASA for 5 days preoperatively.  I called Dr. Roena Malady office and spoke with Nira Conn and was told to fax pre-operative testing results to their office with attn: Triage nurse Judeen Hammans for urgent review by Dr. Lajuan Lines.  I faxed pre-operative labs, CXR, EKG and anesthesia note by Kabbe, FNP-C.  Chart will be left for follow-up regarding clearance status.   Juliann Pulse reports that if patient is cleared, Dr. Mayer Camel is requesting T&C for 2 Units PRBC to be be drawn on  arrival to have availalable if needed.  They will be entering the order.   [facility] Short Stay Center/Anesthesiology Phone [phone] 10/08/2014 11:26 AM  Addendum: Received our fax cover sheet returned to [place] with handwritten note from Dr. Lajuan Lines at the bottom noting "no further recommendations".   Willeen Cass, FNP-BC [facility] Short Stay Surgical Center/Anesthesiology Phone: [phone] 10/12/2014 1:06 PM

## 2014-10-12 DIAGNOSIS — M1712 Unilateral primary osteoarthritis, left knee: Secondary | ICD-10-CM | POA: Diagnosis present

## 2014-10-12 NOTE — H&P (Signed)
TOTAL KNEE ADMISSION H&P  Patient is being admitted for left total knee arthroplasty.  Subjective:  Chief Complaint:left knee pain.  HPI: Wanda [name], 49 y.o. female, has a history of pain and functional disability in the left knee due to arthritis and has failed non-surgical conservative treatments for greater than 12 weeks to includeNSAID's and/or analgesics, corticosteriod injections, flexibility and strengthening excercises, use of assistive devices and activity modification.  Onset of symptoms was gradual, starting several years ago with gradually worsening course since that time. The patient noted no past surgery on the left knee(s).  Patient currently rates pain in the left knee(s) at 10 out of 10 with activity. Patient has night pain, worsening of pain with activity and weight bearing, pain that interferes with activities of daily living, crepitus and joint swelling.  Patient has evidence of subchondral sclerosis, joint subluxation and joint space narrowing by imaging studies. There is no active infection.  Patient Active Problem List   Diagnosis Date Noted  . Lumbar spinal stenosis 04/10/2014  . 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 Cervical disc disorder with radiculopathy of cervical region 05/03/2013  . Weakness of left side of body 10/28/2012  . Complicated migraine 53/97/6734  . Disturbance of skin sensation 06/02/2012  . CVA (cerebral infarction)? vs Atypical Migraine 10/28/2011  . Sinusitis 04/10/2011  . Borderline blood pressure 04/10/2011  . CHRONIC RHINITIS 04/28/2010  . Lumbar L5 diskektomy 02/17/2010  . GERD 08/20/2009  . ABDOMINAL PAIN, UNSPECIFIED 08/20/2009  . HYPERCHOLESTEROLEMIA 12/26/2007  . OBESITY 12/07/2007  . DEPRESSION 12/07/2007  . URI, ACUTE 12/07/2007  . TMJ PAIN 12/07/2007  . FATIGUE 12/07/2007  . HEADACHE, CHRONIC 12/07/2007  . TOBACCO ABUSE, HX OF 12/07/2007   Past Medical History  Diagnosis Date  . Depression   . Fatigue    . TMJ pain dysfunction syndrome   . Obesity   . Sinusitis   . Hypertension   . Hypercholesterolemia   . Transient ischemic attack (TIA)     left sided weakness  . PONV (postoperative nausea and vomiting)   . Dizziness   . Anxiety   . Chronic headache     migraines  . Arthritis   . History of blood transfusion   . Family history of adverse reaction to anesthesia     Mother- N/V, Son N/V  . Shortness of breath dyspnea     with exertion  . Stroke 10/27/2012    TIA   1- 10/29/11-weakness left side  . Asthma     last time 2015  . GERD (gastroesophageal reflux disease)     " states she aspirates in sleep."  using a second pillow    Past Surgical History  Procedure Laterality Date  . Torn disk l4 l5 s1      surgery by [facility] 9/11 +10/11  . Nasal sinus surgery    . Vaginal hysterectomy      for DUB in 10/2003  . Cholecystectomy      11/19/09  . Gallbladder surgery    . Knee arthroscopy  02/2000  . Abdominal hysterectomy    . Left carpel tunnel Left   . Back surgery      x 5 3rd fusion, 4th was repair of fusion    No prescriptions prior to admission   Allergies  Allergen Reactions  . Latex Rash  . Lisinopril Swelling and Other (See Comments)    Makes faces, lips and neck swell.  . Oxycodone-Acetaminophen Itching and Other (See Comments)    Patient  states she is allergic to the inactive ingredient  . Topamax [Topiramate] Other (See Comments)    Short term memory loss.      History  Substance Use Topics  . Smoking status: Former Smoker -- 0.50 packs/day for 20 years    Types: Cigarettes    Quit date: 07/20/2004  . Smokeless tobacco: Not on file     Comment: 3/4 of pack  . Alcohol Use: No    Family History  Problem Relation Age of Onset  . Cervical cancer Maternal Grandmother   . Cancer Maternal Grandmother     mouth  . Hypertension Brother   . Heart failure Brother     ICD  . Heart attack Maternal Grandmother 55     Review of Systems  Constitutional:  Positive for malaise/fatigue and diaphoresis.  Respiratory: Positive for shortness of breath.   Gastrointestinal: Positive for nausea.  Musculoskeletal: Positive for joint pain.  Neurological: Positive for dizziness and headaches.  Endo/Heme/Allergies:       Hot flashes  Psychiatric/Behavioral: Positive for depression and memory loss. The patient is nervous/anxious and has insomnia.     Objective:  Physical Exam  Constitutional: She is oriented to person, place, and time. She appears well-developed and well-nourished.  HENT:  Head: Normocephalic and atraumatic.  Eyes: Pupils are equal, round, and reactive to light.  Neck: Normal range of motion. Neck supple.  Cardiovascular: Intact distal pulses.   Respiratory: Effort normal.  Musculoskeletal:  She has 5-90 range of motion in the left knee with significant crepitus.  She has no tenderness to palpation.  She has pain and crepitus with external rotation.  She has pain with varus and valgus movement.  On the right knee, she has range of motion from 5-80.  She is tender to palpation on the medial lateral sides of the patellar tendon.  She has pain with valgus and varus movement.  She is distally neurovascularly intact.  Neurological: She is alert and oriented to person, place, and time.  Skin: Skin is warm and dry.  Psychiatric: She has a normal mood and affect. Her behavior is normal. Judgment and thought content normal.    Vital signs in last 24 hours:    Labs:   Estimated body mass index is 37.30 kg/(m^2) as calculated from the following:   Height as of 08/21/14: 5\' 6"  (1.676 m).   Weight as of 08/21/14: 104.781 kg (231 lb).   Imaging Review Plain radiographs demonstrate moderate degenerative joint disease of the left knee(s). The overall alignment isneutral. The bone quality appears to be good for age and reported activity level.  Assessment/Plan:  End stage arthritis, left knee   The patient history, physical  examination, clinical judgment of the provider and imaging studies are consistent with end stage degenerative joint disease of the left knee(s) and total knee arthroplasty is deemed medically necessary. The treatment options including medical management, injection therapy arthroscopy and arthroplasty were discussed at length. The risks and benefits of total knee arthroplasty were presented and reviewed. The risks due to aseptic loosening, infection, stiffness, patella tracking problems, thromboembolic complications and other imponderables were discussed. The patient acknowledged the explanation, agreed to proceed with the plan and consent was signed. Patient is being admitted for inpatient treatment for surgery, pain control, PT, OT, prophylactic antibiotics, VTE prophylaxis, progressive ambulation and ADL's and discharge planning. The patient is planning to be discharged home with home health services

## 2014-10-12 NOTE — Progress Notes (Signed)
Left message with Sandi Raveling at Dr. Damita Dunnings office  That we have not received medical clearance and type/screen has not been entered in epic for DOS

## 2014-10-12 NOTE — Progress Notes (Signed)
I spoke with Wanda [name] and informed her of new arrival time of 0900, patient will be having lab drawn and informed her of surgery time of 11:25.

## 2014-10-14 MED ORDER — DEXTROSE-NACL 5-0.45 % IV SOLN: INTRAVENOUS | Status: DC

## 2014-10-14 MED ORDER — CEFAZOLIN SODIUM-DEXTROSE 2-3 GM-% IV SOLR: 2.0000 g | INTRAVENOUS | Status: AC

## 2014-10-15 ENCOUNTER — Encounter (HOSPITAL_COMMUNITY)
Admission: RE | Disposition: A | Discharge status: Home / Self Care | Payer: Self-pay | Source: Ambulatory Visit | Attending: Orthopedic Surgery

## 2014-10-15 ENCOUNTER — Encounter (HOSPITAL_COMMUNITY)
Admission: RE | Admit: 2014-10-15 | Discharge: 2014-10-15 | Disposition: A | Discharge status: Home / Self Care | Payer: BLUE CROSS/BLUE SHIELD | Source: Ambulatory Visit | Attending: Orthopedic Surgery | Admitting: Orthopedic Surgery

## 2014-10-15 ENCOUNTER — Encounter (HOSPITAL_COMMUNITY): Payer: Self-pay | Admitting: *Deleted

## 2014-10-15 DIAGNOSIS — I10 Essential (primary) hypertension: Secondary | ICD-10-CM | POA: Diagnosis not present

## 2014-10-15 DIAGNOSIS — E669 Obesity, unspecified: Secondary | ICD-10-CM | POA: Diagnosis not present

## 2014-10-15 DIAGNOSIS — K219 Gastro-esophageal reflux disease without esophagitis: Secondary | ICD-10-CM | POA: Diagnosis not present

## 2014-10-15 DIAGNOSIS — E780 Pure hypercholesterolemia: Secondary | ICD-10-CM | POA: Insufficient documentation

## 2014-10-15 DIAGNOSIS — Z0183 Encounter for blood typing: Secondary | ICD-10-CM | POA: Insufficient documentation

## 2014-10-15 DIAGNOSIS — M1712 Unilateral primary osteoarthritis, left knee: Secondary | ICD-10-CM | POA: Diagnosis present

## 2014-10-15 DIAGNOSIS — Z79899 Other long term (current) drug therapy: Secondary | ICD-10-CM | POA: Insufficient documentation

## 2014-10-15 DIAGNOSIS — Z8249 Family history of ischemic heart disease and other diseases of the circulatory system: Secondary | ICD-10-CM | POA: Diagnosis not present

## 2014-10-15 DIAGNOSIS — Z6839 Body mass index (BMI) 39.0-39.9, adult: Secondary | ICD-10-CM | POA: Diagnosis not present

## 2014-10-15 DIAGNOSIS — Z8673 Personal history of transient ischemic attack (TIA), and cerebral infarction without residual deficits: Secondary | ICD-10-CM | POA: Insufficient documentation

## 2014-10-15 DIAGNOSIS — Z5309 Procedure and treatment not carried out because of other contraindication: Secondary | ICD-10-CM | POA: Insufficient documentation

## 2014-10-15 DIAGNOSIS — J45909 Unspecified asthma, uncomplicated: Secondary | ICD-10-CM | POA: Insufficient documentation

## 2014-10-15 LAB — CBC WITH DIFFERENTIAL/PLATELET
BASOS PCT: 0 % (ref 0–1)
Basophils Absolute: 0 10*3/uL (ref 0.0–0.1)
EOS PCT: 3 % (ref 0–5)
Eosinophils Absolute: 0.2 10*3/uL (ref 0.0–0.7)
HEMATOCRIT: 28.4 % — AB (ref 36.0–46.0)
HEMOGLOBIN: 8.4 g/dL — AB (ref 12.0–15.0)
LYMPHS ABS: 1.9 10*3/uL (ref 0.7–4.0)
Lymphocytes Relative: 25 % (ref 12–46)
MCH: 19.1 pg — ABNORMAL LOW (ref 26.0–34.0)
MCHC: 29.6 g/dL — ABNORMAL LOW (ref 30.0–36.0)
MCV: 64.5 fL — AB (ref 78.0–100.0)
MONOS PCT: 6 % (ref 3–12)
Monocytes Absolute: 0.5 10*3/uL (ref 0.1–1.0)
NEUTROS ABS: 4.9 10*3/uL (ref 1.7–7.7)
Neutrophils Relative %: 66 % (ref 43–77)
Platelets: 261 10*3/uL (ref 150–400)
RBC: 4.4 MIL/uL (ref 3.87–5.11)
RDW: 20.5 % — ABNORMAL HIGH (ref 11.5–15.5)
WBC: 7.5 10*3/uL (ref 4.0–10.5)

## 2014-10-15 LAB — TYPE AND SCREEN
ABO/RH(D): O POS
Antibody Screen: NEGATIVE

## 2014-10-15 SURGERY — ARTHROPLASTY, KNEE, TOTAL
Anesthesia: Spinal | Site: Knee | Laterality: Left

## 2014-10-15 MED ORDER — LIDOCAINE HCL (CARDIAC) 20 MG/ML IV SOLN
INTRAVENOUS | Status: AC
  Filled 2014-10-15: qty 5

## 2014-10-15 MED ORDER — FENTANYL CITRATE 0.05 MG/ML IJ SOLN
INTRAMUSCULAR | Status: AC
  Filled 2014-10-15: qty 2

## 2014-10-15 MED ORDER — MIDAZOLAM HCL 2 MG/2ML IJ SOLN
INTRAMUSCULAR | Status: AC
  Filled 2014-10-15: qty 2

## 2014-10-15 MED ORDER — CEFAZOLIN SODIUM-DEXTROSE 2-3 GM-% IV SOLR
INTRAVENOUS | Status: AC
  Filled 2014-10-15: qty 50

## 2014-10-15 MED ORDER — PROPOFOL 10 MG/ML IV BOLUS
INTRAVENOUS | Status: AC
  Filled 2014-10-15: qty 20

## 2014-10-15 MED ORDER — FENTANYL CITRATE 0.05 MG/ML IJ SOLN
INTRAMUSCULAR | Status: AC
  Filled 2014-10-15: qty 5

## 2014-10-15 MED ORDER — CHLORHEXIDINE GLUCONATE 4 % EX LIQD: 60.0000 mL | Freq: Once | CUTANEOUS | Status: DC

## 2014-10-15 SURGICAL SUPPLY — 57 items
BANDAGE ESMARK 6X9 LF (GAUZE/BANDAGES/DRESSINGS) ×1 IMPLANT
BLADE SAG 18X100X1.27 (BLADE) ×3 IMPLANT
BLADE SAW SGTL 13X75X1.27 (BLADE) ×3 IMPLANT
BLADE SURG ROTATE 9660 (MISCELLANEOUS) IMPLANT
BNDG ELASTIC 6X10 VLCR STRL LF (GAUZE/BANDAGES/DRESSINGS) ×3 IMPLANT
BNDG ESMARK 6X9 LF (GAUZE/BANDAGES/DRESSINGS) ×3
BOWL SMART MIX CTS (DISPOSABLE) ×3 IMPLANT
COVER SURGICAL LIGHT HANDLE (MISCELLANEOUS) ×3 IMPLANT
CUFF TOURNIQUET SINGLE 34IN LL (TOURNIQUET CUFF) IMPLANT
CUFF TOURNIQUET SINGLE 44IN (TOURNIQUET CUFF) IMPLANT
DRAPE EXTREMITY T 121X128X90 (DRAPE) ×3 IMPLANT
DRAPE IMP U-DRAPE 54X76 (DRAPES) ×3 IMPLANT
DRAPE U-SHAPE 47X51 STRL (DRAPES) ×3 IMPLANT
DURAPREP 26ML APPLICATOR (WOUND CARE) ×6 IMPLANT
ELECT REM PT RETURN 9FT ADLT (ELECTROSURGICAL) ×3
ELECTRODE REM PT RTRN 9FT ADLT (ELECTROSURGICAL) ×1 IMPLANT
EVACUATOR 1/8 PVC DRAIN (DRAIN) ×3 IMPLANT
GAUZE SPONGE 4X4 12PLY STRL (GAUZE/BANDAGES/DRESSINGS) ×6 IMPLANT
GAUZE XEROFORM 1X8 LF (GAUZE/BANDAGES/DRESSINGS) ×3 IMPLANT
GLOVE BIO SURGEON STRL SZ7.5 (GLOVE) ×3 IMPLANT
GLOVE BIO SURGEON STRL SZ8.5 (GLOVE) ×3 IMPLANT
GLOVE BIOGEL PI IND STRL 8 (GLOVE) ×1 IMPLANT
GLOVE BIOGEL PI IND STRL 9 (GLOVE) ×1 IMPLANT
GLOVE BIOGEL PI INDICATOR 8 (GLOVE) ×2
GLOVE BIOGEL PI INDICATOR 9 (GLOVE) ×2
GOWN STRL REUS W/ TWL LRG LVL3 (GOWN DISPOSABLE) ×1 IMPLANT
GOWN STRL REUS W/ TWL XL LVL3 (GOWN DISPOSABLE) ×2 IMPLANT
GOWN STRL REUS W/TWL LRG LVL3 (GOWN DISPOSABLE) ×2
GOWN STRL REUS W/TWL XL LVL3 (GOWN DISPOSABLE) ×4
HANDPIECE INTERPULSE COAX TIP (DISPOSABLE) ×2
HOOD PEEL AWAY FACE SHEILD DIS (HOOD) ×6 IMPLANT
KIT BASIN OR (CUSTOM PROCEDURE TRAY) ×3 IMPLANT
KIT ROOM TURNOVER OR (KITS) ×3 IMPLANT
MANIFOLD NEPTUNE II (INSTRUMENTS) ×3 IMPLANT
NDL SAFETY ECLIPSE 18X1.5 (NEEDLE) IMPLANT
NEEDLE 22X1 1/2 (OR ONLY) (NEEDLE) ×3 IMPLANT
NEEDLE HYPO 18GX1.5 SHARP (NEEDLE)
NEEDLE SPNL 18GX3.5 QUINCKE PK (NEEDLE) IMPLANT
NS IRRIG 1000ML POUR BTL (IV SOLUTION) ×3 IMPLANT
PACK TOTAL JOINT (CUSTOM PROCEDURE TRAY) ×3 IMPLANT
PACK UNIVERSAL I (CUSTOM PROCEDURE TRAY) ×3 IMPLANT
PAD ARMBOARD 7.5X6 YLW CONV (MISCELLANEOUS) ×6 IMPLANT
PADDING CAST COTTON 6X4 STRL (CAST SUPPLIES) ×3 IMPLANT
SET HNDPC FAN SPRY TIP SCT (DISPOSABLE) ×1 IMPLANT
STAPLER VISISTAT 35W (STAPLE) ×3 IMPLANT
SUCTION FRAZIER TIP 10 FR DISP (SUCTIONS) ×3 IMPLANT
SUT VIC AB 0 CT1 27 (SUTURE) ×2
SUT VIC AB 0 CT1 27XBRD ANBCTR (SUTURE) ×1 IMPLANT
SUT VIC AB 1 CTX 36 (SUTURE) ×2
SUT VIC AB 1 CTX36XBRD ANBCTR (SUTURE) ×1 IMPLANT
SUT VIC AB 2-0 CT1 27 (SUTURE) ×2
SUT VIC AB 2-0 CT1 TAPERPNT 27 (SUTURE) ×1 IMPLANT
SYR 30ML LL (SYRINGE) ×3 IMPLANT
SYR 50ML LL SCALE MARK (SYRINGE) ×3 IMPLANT
TOWEL OR 17X24 6PK STRL BLUE (TOWEL DISPOSABLE) ×3 IMPLANT
TOWEL OR 17X26 10 PK STRL BLUE (TOWEL DISPOSABLE) ×3 IMPLANT
WATER STERILE IRR 1000ML POUR (IV SOLUTION) ×6 IMPLANT

## 2014-10-15 NOTE — Anesthesia Preprocedure Evaluation (Deleted)
Anesthesia Evaluation  Patient identified by MRN, date of birth, ID band Patient awake    History of Anesthesia Complications (+) PONV and history of anesthetic complications  Airway Mallampati: II  TM Distance: >3 FB Neck ROM: Full    Dental  (+) Teeth Intact, Dental Advisory Given   Pulmonary asthma , former smoker,    Pulmonary exam normal       Cardiovascular hypertension,     Neuro/Psych PSYCHIATRIC DISORDERS Anxiety Depression CVA, Residual Symptoms    GI/Hepatic Neg liver ROS, GERD-  ,  Endo/Other  negative endocrine ROS  Renal/GU negative Renal ROS     Musculoskeletal   Abdominal   Peds  Hematology negative hematology ROS (+)   Anesthesia Other Findings   Reproductive/Obstetrics                           Anesthesia Physical Anesthesia Plan  ASA: III  Anesthesia Plan: General   Post-op Pain Management:    Induction: Intravenous  Airway Management Planned: LMA  Additional Equipment:   Intra-op Plan:   Post-operative Plan: Extubation in OR  Informed Consent: I have reviewed the patients History and Physical, chart, labs and discussed the procedure including the risks, benefits and alternatives for the proposed anesthesia with the patient or authorized representative who has indicated his/her understanding and acceptance.   Dental advisory given  Plan Discussed with: CRNA, Anesthesiologist and Surgeon  Anesthesia Plan Comments: (Pt refuses SAB)      Anesthesia Quick Evaluation

## 2014-10-15 NOTE — Progress Notes (Signed)
Dr.Rowan came in to talk with pt-surgery scheduled for today has been cancelled d/t low HGB.I witnessed pt call and get an appointment today with her medical MD

## 2014-10-31 ENCOUNTER — Ambulatory Visit
Admission: RE | Admit: 2014-10-31 | Discharge: 2014-10-31 | Disposition: A | Discharge status: Home / Self Care | Payer: BLUE CROSS/BLUE SHIELD | Source: Ambulatory Visit | Attending: Gastroenterology | Admitting: Gastroenterology

## 2014-10-31 ENCOUNTER — Other Ambulatory Visit: Payer: Self-pay | Admitting: Gastroenterology

## 2014-10-31 DIAGNOSIS — R1011 Right upper quadrant pain: Secondary | ICD-10-CM

## 2014-12-12 ENCOUNTER — Other Ambulatory Visit: Payer: Self-pay | Admitting: Orthopedic Surgery

## 2014-12-19 ENCOUNTER — Other Ambulatory Visit (HOSPITAL_COMMUNITY): Payer: Self-pay | Admitting: *Deleted

## 2014-12-19 NOTE — Pre-Procedure Instructions (Signed)
    Wanda [name]  12/19/2014      [facility], [place] - [address] [place] [place] [address] Phone: [phone] Fax: [phone]  [facility] [address] [place] [address] [place] [address] Phone: [phone] Fax: [phone]    Your procedure is scheduled on Monday, December 31, 2014 at 3:20 PM.   Report to [facility] En[facility] "A" Admitting Office at 1:15 PM.  Call this number if you have problems the morning of surgery: [phone]     Remember:  Do not eat food or drink liquids after midnight Sunday, 12/30/14.  Take these medicines the morning of surgery with A SIP OF WATER: Atenolol (Tenormin), Buspirone (Buspar), Pantoprazole (Protonix), Sertraline (Zoloft), Tizanidine (Zanaflex), Flonase,  Hydrocodone - if needed  Stop Aspirin 7 days prior to surgery    Do not wear jewelry, make-up or nail polish.  Do not wear lotions, powders, or perfumes.  You may wear deodorant.  Do not shave 48 hours prior to surgery.    Do not bring valuables to the hospital.  [facility] is not responsible for any belongings or valuables.  Contacts, dentures or bridgework may not be worn into surgery.  Leave your suitcase in the car.  After surgery it may be brought to your room.  For patients admitted to the hospital, discharge time will be determined by your treatment team.  Special instructions:  See "Preparing for Surgery" Instruction sheet.   Please read over the following fact sheets that you were given. Pain Booklet, Coughing and Deep Breathing, Blood Transfusion Information and Surgical Site Infection Prevention

## 2014-12-20 ENCOUNTER — Encounter (HOSPITAL_COMMUNITY)
Admission: RE | Admit: 2014-12-20 | Discharge: 2014-12-20 | Disposition: A | Discharge status: Home / Self Care | Payer: BLUE CROSS/BLUE SHIELD | Source: Ambulatory Visit | Attending: Orthopedic Surgery | Admitting: Orthopedic Surgery

## 2014-12-20 ENCOUNTER — Encounter (HOSPITAL_COMMUNITY): Payer: Self-pay

## 2014-12-20 DIAGNOSIS — Z0183 Encounter for blood typing: Secondary | ICD-10-CM | POA: Insufficient documentation

## 2014-12-20 DIAGNOSIS — M179 Osteoarthritis of knee, unspecified: Secondary | ICD-10-CM | POA: Insufficient documentation

## 2014-12-20 DIAGNOSIS — Z01812 Encounter for preprocedural laboratory examination: Secondary | ICD-10-CM | POA: Insufficient documentation

## 2014-12-20 LAB — BASIC METABOLIC PANEL
ANION GAP: 9 (ref 5–15)
BUN: 12 mg/dL (ref 6–20)
CO2: 24 mmol/L (ref 22–32)
Calcium: 9.6 mg/dL (ref 8.9–10.3)
Chloride: 109 mmol/L (ref 101–111)
Creatinine, Ser: 0.76 mg/dL (ref 0.44–1.00)
GFR calc non Af Amer: >60 mL/min (ref >60)
Glucose, Bld: 98 mg/dL (ref 65–99)
Potassium: 3.4 mmol/L — ABNORMAL LOW (ref 3.5–5.1)
Sodium: 142 mmol/L (ref 135–145)

## 2014-12-20 LAB — CBC WITH DIFFERENTIAL/PLATELET
BASOS PCT: 1 % (ref 0–1)
Basophils Absolute: 0.1 10*3/uL (ref 0.0–0.1)
EOS PCT: 3 % (ref 0–5)
Eosinophils Absolute: 0.2 10*3/uL (ref 0.0–0.7)
HCT: 38.4 % (ref 36.0–46.0)
HEMOGLOBIN: 12.5 g/dL (ref 12.0–15.0)
LYMPHS ABS: 1.5 10*3/uL (ref 0.7–4.0)
LYMPHS PCT: 23 % (ref 12–46)
MCH: 25 pg — ABNORMAL LOW (ref 26.0–34.0)
MCHC: 32.6 g/dL (ref 30.0–36.0)
MCV: 76.6 fL — AB (ref 78.0–100.0)
Monocytes Absolute: 0.3 10*3/uL (ref 0.1–1.0)
Monocytes Relative: 5 % (ref 3–12)
Neutro Abs: 4.6 10*3/uL (ref 1.7–7.7)
Neutrophils Relative %: 68 % (ref 43–77)
PLATELETS: 210 10*3/uL (ref 150–400)
RBC: 5.01 MIL/uL (ref 3.87–5.11)
RDW: 27.2 % — ABNORMAL HIGH (ref 11.5–15.5)
WBC: 6.7 10*3/uL (ref 4.0–10.5)

## 2014-12-20 LAB — URINALYSIS, ROUTINE W REFLEX MICROSCOPIC
Bilirubin Urine: NEGATIVE
Glucose, UA: NEGATIVE mg/dL
Hgb urine dipstick: NEGATIVE
KETONES UR: NEGATIVE mg/dL
Leukocytes, UA: NEGATIVE
NITRITE: NEGATIVE
PH: 6.5 (ref 5.0–8.0)
Protein, ur: NEGATIVE mg/dL
SPECIFIC GRAVITY, URINE: 1.016 (ref 1.005–1.030)
UROBILINOGEN UA: 0.2 mg/dL (ref 0.0–1.0)

## 2014-12-20 LAB — PROTIME-INR
INR: 1.08 (ref 0.00–1.49)
PROTHROMBIN TIME: 14.2 s (ref 11.6–15.2)

## 2014-12-20 LAB — TYPE AND SCREEN
ABO/RH(D): O POS
ANTIBODY SCREEN: NEGATIVE

## 2014-12-20 LAB — SURGICAL PCR SCREEN
MRSA, PCR: NEGATIVE
Staphylococcus aureus: POSITIVE — AB

## 2014-12-20 LAB — APTT: aPTT: 28 seconds (ref 24–37)

## 2014-12-28 NOTE — Progress Notes (Signed)
Message left on voice mail for pt to arrive at 1030 and call back to confirm message.

## 2014-12-28 NOTE — H&P (Signed)
TOTAL KNEE ADMISSION H&P  Patient is being admitted for left total knee arthroplasty.  Subjective:  Chief Complaint:left knee pain.  HPI: Wanda [name], 49 y.o. female, has a history of pain and functional disability in the left knee due to arthritis and has failed non-surgical conservative treatments for greater than 12 weeks to includeNSAID's and/or analgesics, corticosteriod injections, use of assistive devices, weight reduction as appropriate and activity modification.  Onset of symptoms was gradual, starting >10 years ago with gradually worsening course since that time. The patient noted prior procedures on the knee to include  arthroscopy on the left knee(s).  Patient currently rates pain in the left knee(s) at 10 out of 10 with activity. Patient has night pain, worsening of pain with activity and weight bearing, pain that interferes with activities of daily living, pain with passive range of motion, crepitus and joint swelling.  Patient has evidence of joint space narrowing by imaging studies.  There is no active infection.  Patient Active Problem List   Diagnosis Date Noted  . Primary osteoarthritis of left knee 10/12/2014  . Lumbar spinal stenosis 04/10/2014  . 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 Cervical disc disorder with radiculopathy of cervical region 05/03/2013  . Weakness of left side of body 10/28/2012  . Complicated migraine 14/48/1856  . Disturbance of skin sensation 06/02/2012  . CVA (cerebral infarction)? vs Atypical Migraine 10/28/2011  . Sinusitis 04/10/2011  . Borderline blood pressure 04/10/2011  . CHRONIC RHINITIS 04/28/2010  . Lumbar L5 diskektomy 02/17/2010  . GERD 08/20/2009  . ABDOMINAL PAIN, UNSPECIFIED 08/20/2009  . HYPERCHOLESTEROLEMIA 12/26/2007  . OBESITY 12/07/2007  . DEPRESSION 12/07/2007  . URI, ACUTE 12/07/2007  . TMJ PAIN 12/07/2007  . FATIGUE 12/07/2007  . HEADACHE, CHRONIC 12/07/2007  . TOBACCO ABUSE, HX OF 12/07/2007    Past Medical History  Diagnosis Date  . Depression   . Fatigue   . TMJ pain dysfunction syndrome   . Obesity   . Sinusitis   . Hypertension   . Hypercholesterolemia   . Transient ischemic attack (TIA)     left sided weakness  . PONV (postoperative nausea and vomiting)   . Dizziness   . Anxiety   . Chronic headache     migraines  . Arthritis   . History of blood transfusion   . Family history of adverse reaction to anesthesia     Mother- N/V, Son N/V  . Shortness of breath dyspnea     with exertion  . Stroke 10/27/2012    TIA   1- 10/29/11-weakness left side  . Asthma     last time 2015  . GERD (gastroesophageal reflux disease)     " states she aspirates in sleep."  using a second pillow    Past Surgical History  Procedure Laterality Date  . Torn disk l4 l5 s1      surgery by [facility]zer 9/11 +10/11  . Nasal sinus surgery    . Vaginal hysterectomy      for DUB in 10/2003  . Cholecystectomy      11/19/09  . Gallbladder surgery    . Knee arthroscopy  02/2000  . Abdominal hysterectomy    . Left carpel tunnel Left   . Back surgery      x 5 3rd fusion, 4th was repair of fusion    No prescriptions prior to admission   Allergies  Allergen Reactions  . Latex Rash  . Lisinopril Swelling and Other (See Comments)    Makes faces,  lips and neck swell.  . Oxycodone-Acetaminophen Itching and Other (See Comments)    Patient states she is allergic to the inactive ingredient  . Topamax [Topiramate] Other (See Comments)    Short term memory loss.      History  Substance Use Topics  . Smoking status: Former Smoker -- 0.50 packs/day for 20 years    Types: Cigarettes    Quit date: 07/20/2004  . Smokeless tobacco: Not on file     Comment: 3/4 of pack  . Alcohol Use: No    Family History  Problem Relation Age of Onset  . Cervical cancer Maternal Grandmother   . Cancer Maternal Grandmother     mouth  . Hypertension Brother   . Heart failure Brother     ICD  . Heart attack  Maternal Grandmother 55     Review of Systems  Constitutional: Positive for malaise/fatigue and diaphoresis.  HENT: Positive for congestion.   Eyes: Negative.   Respiratory: Positive for shortness of breath.   Cardiovascular: Negative.   Gastrointestinal: Negative.   Genitourinary: Negative.   Musculoskeletal: Positive for joint pain.  Neurological: Positive for dizziness and headaches.  Endo/Heme/Allergies: Negative.   Psychiatric/Behavioral: Positive for depression and memory loss. The patient is nervous/anxious and has insomnia.     Objective:  Physical Exam  Constitutional: She is oriented to person, place, and time. She appears well-developed and well-nourished.  HENT:  Head: Normocephalic and atraumatic.  Eyes: Pupils are equal, round, and reactive to light.  Neck: Normal range of motion. Neck supple.  Cardiovascular: Intact distal pulses.   Respiratory: Effort normal.  Musculoskeletal: She exhibits tenderness.  She has 5-90 range of motion in the left knee with significant crepitus.  She has no tenderness to palpation.  She has pain and crepitus with external rotation.  She has pain with varus and valgus movement.  On the right knee, she has range of motion from 5-80.  She is tender to palpation on the medial lateral sides of the patellar tendon.  She has pain with valgus and varus movement.  She is distally neurovascularly intact.  Neurological: She is alert and oriented to person, place, and time.  Skin: Skin is warm and dry.  Psychiatric: She has a normal mood and affect. Her behavior is normal. Judgment and thought content normal.    Vital signs in last 24 hours:    Labs:   Estimated body mass index is 39.40 kg/(m^2) as calculated from the following:   Height as of 10/15/14: 5\' 6"  (1.676 m).   Weight as of 10/04/14: 110.678 kg (244 lb).   Imaging Review Plain radiographs demonstrate lateral subluxation of the left tibia and bone on bone arthritis of the left  knee.    Assessment/Plan:  End stage arthritis, left knee   The patient history, physical examination, clinical judgment of the provider and imaging studies are consistent with end stage degenerative joint disease of the left knee(s) and total knee arthroplasty is deemed medically necessary. The treatment options including medical management, injection therapy arthroscopy and arthroplasty were discussed at length. The risks and benefits of total knee arthroplasty were presented and reviewed. The risks due to aseptic loosening, infection, stiffness, patella tracking problems, thromboembolic complications and other imponderables were discussed. The patient acknowledged the explanation, agreed to proceed with the plan and consent was signed. Patient is being admitted for inpatient treatment for surgery, pain control, PT, OT, prophylactic antibiotics, VTE prophylaxis, progressive ambulation and ADL's and discharge planning. The patient  is planning to be discharged home with home health services

## 2014-12-30 MED ORDER — CEFAZOLIN SODIUM-DEXTROSE 2-3 GM-% IV SOLR
2.0000 g | INTRAVENOUS | Status: AC
  Administered 2014-12-31: 2 g via INTRAVENOUS
  Filled 2014-12-30: qty 50

## 2014-12-31 ENCOUNTER — Inpatient Hospital Stay (HOSPITAL_COMMUNITY)
Admission: RE | Admit: 2014-12-31 | Discharge: 2015-01-03 | DRG: 470 | Disposition: A | Discharge status: Home Health | Payer: BLUE CROSS/BLUE SHIELD | Source: Ambulatory Visit | Attending: Orthopedic Surgery | Admitting: Orthopedic Surgery

## 2014-12-31 ENCOUNTER — Inpatient Hospital Stay (HOSPITAL_COMMUNITY): Payer: BLUE CROSS/BLUE SHIELD | Admitting: Certified Registered"

## 2014-12-31 ENCOUNTER — Encounter (HOSPITAL_COMMUNITY): Payer: Self-pay | Admitting: Certified Registered"

## 2014-12-31 ENCOUNTER — Encounter (HOSPITAL_COMMUNITY)
Admission: RE | Disposition: A | Discharge status: Home Health | Payer: Self-pay | Source: Ambulatory Visit | Attending: Orthopedic Surgery

## 2014-12-31 DIAGNOSIS — E669 Obesity, unspecified: Secondary | ICD-10-CM | POA: Diagnosis present

## 2014-12-31 DIAGNOSIS — I10 Essential (primary) hypertension: Secondary | ICD-10-CM | POA: Diagnosis present

## 2014-12-31 DIAGNOSIS — K219 Gastro-esophageal reflux disease without esophagitis: Secondary | ICD-10-CM | POA: Diagnosis present

## 2014-12-31 DIAGNOSIS — J45909 Unspecified asthma, uncomplicated: Secondary | ICD-10-CM | POA: Diagnosis present

## 2014-12-31 DIAGNOSIS — Z87891 Personal history of nicotine dependence: Secondary | ICD-10-CM | POA: Diagnosis not present

## 2014-12-31 DIAGNOSIS — Z6841 Body Mass Index (BMI) 40.0 and over, adult: Secondary | ICD-10-CM | POA: Diagnosis not present

## 2014-12-31 DIAGNOSIS — D62 Acute posthemorrhagic anemia: Secondary | ICD-10-CM | POA: Diagnosis not present

## 2014-12-31 DIAGNOSIS — Z888 Allergy status to other drugs, medicaments and biological substances status: Secondary | ICD-10-CM

## 2014-12-31 DIAGNOSIS — Z885 Allergy status to narcotic agent status: Secondary | ICD-10-CM

## 2014-12-31 DIAGNOSIS — Z91040 Latex allergy status: Secondary | ICD-10-CM

## 2014-12-31 DIAGNOSIS — Z8673 Personal history of transient ischemic attack (TIA), and cerebral infarction without residual deficits: Secondary | ICD-10-CM | POA: Diagnosis not present

## 2014-12-31 DIAGNOSIS — F419 Anxiety disorder, unspecified: Secondary | ICD-10-CM | POA: Diagnosis present

## 2014-12-31 DIAGNOSIS — E780 Pure hypercholesterolemia: Secondary | ICD-10-CM | POA: Diagnosis present

## 2014-12-31 DIAGNOSIS — M1712 Unilateral primary osteoarthritis, left knee: Principal | ICD-10-CM | POA: Diagnosis present

## 2014-12-31 DIAGNOSIS — M1710 Unilateral primary osteoarthritis, unspecified knee: Secondary | ICD-10-CM | POA: Diagnosis present

## 2014-12-31 HISTORY — PX: TOTAL KNEE ARTHROPLASTY: SHX125

## 2014-12-31 SURGERY — ARTHROPLASTY, KNEE, TOTAL
Anesthesia: General | Site: Knee | Laterality: Left

## 2014-12-31 MED ORDER — ONDANSETRON HCL 4 MG/2ML IJ SOLN: 4.0000 mg | Freq: Four times a day (QID) | INTRAMUSCULAR | Status: DC | PRN

## 2014-12-31 MED ORDER — METHOCARBAMOL 500 MG PO TABS
ORAL_TABLET | ORAL | Status: AC
  Filled 2014-12-31: qty 1

## 2014-12-31 MED ORDER — VITAMIN D (ERGOCALCIFEROL) 1.25 MG (50000 UNIT) PO CAPS
50000.0000 [IU] | ORAL_CAPSULE | ORAL | Status: DC
  Administered 2015-01-01: 50000 [IU] via ORAL
  Filled 2014-12-31: qty 1

## 2014-12-31 MED ORDER — MIDAZOLAM HCL 5 MG/5ML IJ SOLN
INTRAMUSCULAR | Status: DC | PRN
  Administered 2014-12-31: 2 mg via INTRAVENOUS

## 2014-12-31 MED ORDER — METOCLOPRAMIDE HCL 5 MG/ML IJ SOLN: 5.0000 mg | Freq: Three times a day (TID) | INTRAMUSCULAR | Status: DC | PRN

## 2014-12-31 MED ORDER — ZONISAMIDE 100 MG PO CAPS
200.0000 mg | ORAL_CAPSULE | Freq: Two times a day (BID) | ORAL | Status: DC
  Administered 2015-01-01 – 2015-01-03 (×6): 200 mg via ORAL
  Filled 2014-12-31 (×9): qty 2

## 2014-12-31 MED ORDER — PROPOFOL 10 MG/ML IV BOLUS
INTRAVENOUS | Status: AC
  Filled 2014-12-31: qty 20

## 2014-12-31 MED ORDER — PANTOPRAZOLE SODIUM 40 MG PO TBEC
40.0000 mg | DELAYED_RELEASE_TABLET | Freq: Every day | ORAL | Status: DC
  Administered 2014-12-31 – 2015-01-03 (×4): 40 mg via ORAL
  Filled 2014-12-31 (×4): qty 1

## 2014-12-31 MED ORDER — HYDROMORPHONE HCL 1 MG/ML IJ SOLN
INTRAMUSCULAR | Status: AC
  Administered 2014-12-31: 0.5 mg via INTRAVENOUS
  Filled 2014-12-31: qty 1

## 2014-12-31 MED ORDER — DOCUSATE SODIUM 100 MG PO CAPS
100.0000 mg | ORAL_CAPSULE | Freq: Two times a day (BID) | ORAL | Status: DC
  Administered 2014-12-31 – 2015-01-03 (×6): 100 mg via ORAL
  Filled 2014-12-31 (×6): qty 1

## 2014-12-31 MED ORDER — ASPIRIN EC 325 MG PO TBEC: 325.0000 mg | DELAYED_RELEASE_TABLET | Freq: Two times a day (BID) | ORAL | Status: DC

## 2014-12-31 MED ORDER — DIPHENHYDRAMINE HCL 12.5 MG/5ML PO ELIX: 12.5000 mg | ORAL_SOLUTION | ORAL | Status: DC | PRN

## 2014-12-31 MED ORDER — DEXTROSE-NACL 5-0.45 % IV SOLN: INTRAVENOUS | Status: DC

## 2014-12-31 MED ORDER — GLYCOPYRROLATE 0.2 MG/ML IJ SOLN
INTRAMUSCULAR | Status: DC | PRN
  Administered 2014-12-31: 0.4 mg via INTRAVENOUS

## 2014-12-31 MED ORDER — SCOPOLAMINE 1 MG/3DAYS TD PT72
MEDICATED_PATCH | TRANSDERMAL | Status: AC
  Filled 2014-12-31: qty 1

## 2014-12-31 MED ORDER — SCOPOLAMINE 1 MG/3DAYS TD PT72
1.0000 | MEDICATED_PATCH | TRANSDERMAL | Status: DC
  Administered 2014-12-31: 1 via TRANSDERMAL

## 2014-12-31 MED ORDER — ONDANSETRON HCL 4 MG/2ML IJ SOLN
INTRAMUSCULAR | Status: DC | PRN
  Administered 2014-12-31: 4 mg via INTRAVENOUS

## 2014-12-31 MED ORDER — SODIUM CHLORIDE 0.9 % IJ SOLN
INTRAMUSCULAR | Status: DC | PRN
  Administered 2014-12-31: 40 mL

## 2014-12-31 MED ORDER — FLEET ENEMA 7-19 GM/118ML RE ENEM: 1.0000 | ENEMA | Freq: Once | RECTAL | Status: AC | PRN

## 2014-12-31 MED ORDER — DOXYCYCLINE HYCLATE 100 MG PO TBEC: 100.0000 mg | DELAYED_RELEASE_TABLET | Freq: Two times a day (BID) | ORAL | Status: DC

## 2014-12-31 MED ORDER — PROPOFOL 10 MG/ML IV BOLUS
INTRAVENOUS | Status: DC | PRN
  Administered 2014-12-31: 40 mg via INTRAVENOUS
  Administered 2014-12-31: 200 mg via INTRAVENOUS

## 2014-12-31 MED ORDER — DOXYCYCLINE HYCLATE 100 MG PO TABS
100.0000 mg | ORAL_TABLET | Freq: Two times a day (BID) | ORAL | Status: DC
  Administered 2014-12-31 – 2015-01-03 (×6): 100 mg via ORAL
  Filled 2014-12-31 (×6): qty 1

## 2014-12-31 MED ORDER — SERTRALINE HCL 100 MG PO TABS
100.0000 mg | ORAL_TABLET | Freq: Two times a day (BID) | ORAL | Status: DC
  Administered 2014-12-31 – 2015-01-02 (×5): 200 mg via ORAL
  Administered 2015-01-03: 100 mg via ORAL
  Filled 2014-12-31: qty 2
  Filled 2014-12-31 (×2): qty 1
  Filled 2014-12-31 (×2): qty 2
  Filled 2014-12-31: qty 1
  Filled 2014-12-31: qty 2

## 2014-12-31 MED ORDER — MENTHOL 3 MG MT LOZG
1.0000 | LOZENGE | OROMUCOSAL | Status: DC | PRN
  Administered 2015-01-01 – 2015-01-02 (×2): 3 mg via ORAL
  Filled 2014-12-31 (×2): qty 9

## 2014-12-31 MED ORDER — BUPIVACAINE-EPINEPHRINE (PF) 0.5% -1:200000 IJ SOLN
INTRAMUSCULAR | Status: DC | PRN
  Administered 2014-12-31: 80 mL
  Administered 2014-12-31: 30 mL via PERINEURAL

## 2014-12-31 MED ORDER — HYDROCODONE-ACETAMINOPHEN 5-325 MG PO TABS: 1.0000 | ORAL_TABLET | Freq: Four times a day (QID) | ORAL | Status: DC | PRN

## 2014-12-31 MED ORDER — FERROUS SULFATE 325 (65 FE) MG PO TABS
325.0000 mg | ORAL_TABLET | Freq: Two times a day (BID) | ORAL | Status: DC
  Administered 2014-12-31 – 2015-01-03 (×6): 325 mg via ORAL
  Filled 2014-12-31 (×6): qty 1

## 2014-12-31 MED ORDER — PHENYLEPHRINE HCL 10 MG/ML IJ SOLN
INTRAMUSCULAR | Status: DC | PRN
  Administered 2014-12-31 (×4): 80 ug via INTRAVENOUS

## 2014-12-31 MED ORDER — METHOCARBAMOL 500 MG PO TABS
500.0000 mg | ORAL_TABLET | Freq: Four times a day (QID) | ORAL | Status: DC | PRN
  Administered 2014-12-31 – 2015-01-03 (×4): 500 mg via ORAL
  Filled 2014-12-31 (×3): qty 1

## 2014-12-31 MED ORDER — FENTANYL CITRATE (PF) 250 MCG/5ML IJ SOLN
INTRAMUSCULAR | Status: AC
  Filled 2014-12-31: qty 5

## 2014-12-31 MED ORDER — BUSPIRONE HCL 15 MG PO TABS
7.5000 mg | ORAL_TABLET | Freq: Two times a day (BID) | ORAL | Status: DC
Start: 2014-12-31 — End: 2015-01-03
  Administered 2014-12-31 – 2015-01-03 (×6): 7.5 mg via ORAL
  Filled 2014-12-31 (×2): qty 1
  Filled 2014-12-31 (×2): qty 2
  Filled 2014-12-31 (×2): qty 1
  Filled 2014-12-31 (×2): qty 2
  Filled 2014-12-31 (×3): qty 1
  Filled 2014-12-31: qty 2

## 2014-12-31 MED ORDER — NEOSTIGMINE METHYLSULFATE 10 MG/10ML IV SOLN
INTRAVENOUS | Status: AC
  Filled 2014-12-31: qty 1

## 2014-12-31 MED ORDER — KCL IN DEXTROSE-NACL 20-5-0.45 MEQ/L-%-% IV SOLN
INTRAVENOUS | Status: DC
  Administered 2014-12-31 – 2015-01-01 (×2): via INTRAVENOUS
  Filled 2014-12-31 (×11): qty 1000

## 2014-12-31 MED ORDER — ONDANSETRON HCL 4 MG PO TABS: 4.0000 mg | ORAL_TABLET | Freq: Four times a day (QID) | ORAL | Status: DC | PRN

## 2014-12-31 MED ORDER — ASPIRIN EC 325 MG PO TBEC
325.0000 mg | DELAYED_RELEASE_TABLET | Freq: Every day | ORAL | Status: DC
  Administered 2015-01-01 – 2015-01-03 (×3): 325 mg via ORAL
  Filled 2014-12-31 (×3): qty 1

## 2014-12-31 MED ORDER — METOCLOPRAMIDE HCL 5 MG PO TABS: 5.0000 mg | ORAL_TABLET | Freq: Three times a day (TID) | ORAL | Status: DC | PRN

## 2014-12-31 MED ORDER — ATENOLOL 50 MG PO TABS
50.0000 mg | ORAL_TABLET | Freq: Every day | ORAL | Status: DC
  Administered 2015-01-01 – 2015-01-03 (×3): 50 mg via ORAL
  Filled 2014-12-31 (×3): qty 1

## 2014-12-31 MED ORDER — BISACODYL 5 MG PO TBEC: 5.0000 mg | DELAYED_RELEASE_TABLET | Freq: Every day | ORAL | Status: DC | PRN

## 2014-12-31 MED ORDER — PROMETHAZINE HCL 25 MG/ML IJ SOLN
6.2500 mg | INTRAMUSCULAR | Status: DC | PRN
Start: 2014-12-31 — End: 2014-12-31

## 2014-12-31 MED ORDER — HYDROCHLOROTHIAZIDE 12.5 MG PO CAPS
12.5000 mg | ORAL_CAPSULE | Freq: Every day | ORAL | Status: DC
  Administered 2014-12-31 – 2015-01-03 (×4): 12.5 mg via ORAL
  Filled 2014-12-31 (×4): qty 1

## 2014-12-31 MED ORDER — OXYCODONE HCL 5 MG PO TABS: 5.0000 mg | ORAL_TABLET | ORAL | Status: DC | PRN

## 2014-12-31 MED ORDER — MIDAZOLAM HCL 2 MG/2ML IJ SOLN
INTRAMUSCULAR | Status: AC
  Filled 2014-12-31: qty 2

## 2014-12-31 MED ORDER — ALUM & MAG HYDROXIDE-SIMETH 200-200-20 MG/5ML PO SUSP: 30.0000 mL | ORAL | Status: DC | PRN

## 2014-12-31 MED ORDER — MEPERIDINE HCL 25 MG/ML IJ SOLN: 6.2500 mg | INTRAMUSCULAR | Status: DC | PRN

## 2014-12-31 MED ORDER — DEXTROSE 5 % IV SOLN: 500.0000 mg | Freq: Four times a day (QID) | INTRAVENOUS | Status: DC | PRN

## 2014-12-31 MED ORDER — PHENOL 1.4 % MT LIQD: 1.0000 | OROMUCOSAL | Status: DC | PRN

## 2014-12-31 MED ORDER — ROCURONIUM BROMIDE 100 MG/10ML IV SOLN
INTRAVENOUS | Status: DC | PRN
  Administered 2014-12-31: 50 mg via INTRAVENOUS

## 2014-12-31 MED ORDER — FLUTICASONE PROPIONATE 50 MCG/ACT NA SUSP
2.0000 | Freq: Every day | NASAL | Status: DC
  Administered 2014-12-31 – 2015-01-02 (×3): 2 via NASAL
  Filled 2014-12-31: qty 16

## 2014-12-31 MED ORDER — CYPROHEPTADINE HCL 4 MG PO TABS
8.0000 mg | ORAL_TABLET | Freq: Two times a day (BID) | ORAL | Status: DC
  Administered 2014-12-31 – 2015-01-03 (×5): 8 mg via ORAL
  Filled 2014-12-31 (×8): qty 2

## 2014-12-31 MED ORDER — FENTANYL CITRATE (PF) 250 MCG/5ML IJ SOLN
INTRAMUSCULAR | Status: DC | PRN
  Administered 2014-12-31: 50 ug via INTRAVENOUS
  Administered 2014-12-31: 100 ug via INTRAVENOUS

## 2014-12-31 MED ORDER — LACTATED RINGERS IV SOLN
INTRAVENOUS | Status: DC
  Administered 2014-12-31 (×2): via INTRAVENOUS

## 2014-12-31 MED ORDER — SENNOSIDES-DOCUSATE SODIUM 8.6-50 MG PO TABS: 1.0000 | ORAL_TABLET | Freq: Every evening | ORAL | Status: DC | PRN

## 2014-12-31 MED ORDER — HYDROCODONE-ACETAMINOPHEN 5-325 MG PO TABS
1.0000 | ORAL_TABLET | ORAL | Status: DC | PRN
Start: 2014-12-31 — End: 2015-01-03
  Administered 2014-12-31 – 2015-01-03 (×12): 2 via ORAL
  Filled 2014-12-31 (×12): qty 2

## 2014-12-31 MED ORDER — BUPIVACAINE LIPOSOME 1.3 % IJ SUSP
20.0000 mL | Freq: Once | INTRAMUSCULAR | Status: AC
  Administered 2014-12-31: 20 mL
  Filled 2014-12-31: qty 20

## 2014-12-31 MED ORDER — PROMETHAZINE HCL 25 MG PO TABS: 25.0000 mg | ORAL_TABLET | Freq: Four times a day (QID) | ORAL | Status: DC | PRN

## 2014-12-31 MED ORDER — TRANEXAMIC ACID 1000 MG/10ML IV SOLN
2000.0000 mg | Freq: Once | INTRAVENOUS | Status: AC
  Administered 2014-12-31: 2000 mg via TOPICAL
  Filled 2014-12-31: qty 20

## 2014-12-31 MED ORDER — METRONIDAZOLE 0.75 % VA GEL: 1.0000 | Freq: Two times a day (BID) | VAGINAL | Status: DC | PRN

## 2014-12-31 MED ORDER — ONDANSETRON HCL 4 MG/2ML IJ SOLN
INTRAMUSCULAR | Status: AC
  Filled 2014-12-31: qty 2

## 2014-12-31 MED ORDER — GLYCOPYRROLATE 0.2 MG/ML IJ SOLN
INTRAMUSCULAR | Status: AC
  Filled 2014-12-31: qty 2

## 2014-12-31 MED ORDER — HYDROMORPHONE HCL 1 MG/ML IJ SOLN
0.5000 mg | INTRAMUSCULAR | Status: DC | PRN
  Administered 2014-12-31 (×2): 1 mg via INTRAVENOUS
  Administered 2015-01-01: 0.5 mg via INTRAVENOUS
  Filled 2014-12-31 (×3): qty 1

## 2014-12-31 MED ORDER — ACETAMINOPHEN 650 MG RE SUPP
650.0000 mg | Freq: Four times a day (QID) | RECTAL | Status: DC | PRN
Start: 2014-12-31 — End: 2015-01-03

## 2014-12-31 MED ORDER — MIDAZOLAM HCL 2 MG/2ML IJ SOLN: 0.5000 mg | Freq: Once | INTRAMUSCULAR | Status: DC | PRN

## 2014-12-31 MED ORDER — EPHEDRINE SULFATE 50 MG/ML IJ SOLN
INTRAMUSCULAR | Status: DC | PRN
  Administered 2014-12-31: 20 mg via INTRAVENOUS
  Administered 2014-12-31: 10 mg via INTRAVENOUS

## 2014-12-31 MED ORDER — HYDROMORPHONE HCL 1 MG/ML IJ SOLN
0.2500 mg | INTRAMUSCULAR | Status: DC | PRN
  Administered 2014-12-31 (×3): 0.5 mg via INTRAVENOUS
  Administered 2014-12-31: 0.25 mg via INTRAVENOUS

## 2014-12-31 MED ORDER — ACETAMINOPHEN 325 MG PO TABS: 650.0000 mg | ORAL_TABLET | Freq: Four times a day (QID) | ORAL | Status: DC | PRN

## 2014-12-31 MED ORDER — CHLORHEXIDINE GLUCONATE 4 % EX LIQD: 60.0000 mL | Freq: Once | CUTANEOUS | Status: DC

## 2014-12-31 MED ORDER — SODIUM CHLORIDE 0.9 % IR SOLN
Status: DC | PRN
  Administered 2014-12-31: 3000 mL

## 2014-12-31 MED ORDER — NEOSTIGMINE METHYLSULFATE 10 MG/10ML IV SOLN
INTRAVENOUS | Status: DC | PRN
  Administered 2014-12-31: 3 mg via INTRAVENOUS

## 2014-12-31 SURGICAL SUPPLY — 69 items
BANDAGE ESMARK 6X9 LF (GAUZE/BANDAGES/DRESSINGS) ×1 IMPLANT
BLADE SAG 18X100X1.27 (BLADE) ×3 IMPLANT
BLADE SAW SGTL 13X75X1.27 (BLADE) ×3 IMPLANT
BLADE SURG ROTATE 9660 (MISCELLANEOUS) IMPLANT
BNDG ELASTIC 6X10 VLCR STRL LF (GAUZE/BANDAGES/DRESSINGS) ×3 IMPLANT
BNDG ESMARK 6X9 LF (GAUZE/BANDAGES/DRESSINGS) ×3
BOWL SMART MIX CTS (DISPOSABLE) ×3 IMPLANT
CAPT KNEE TOTAL 3 ATTUNE ×3 IMPLANT
CEMENT HV SMART SET (Cement) ×6 IMPLANT
COVER SURGICAL LIGHT HANDLE (MISCELLANEOUS) ×3 IMPLANT
CUFF TOURNIQUET SINGLE 34IN LL (TOURNIQUET CUFF) IMPLANT
CUFF TOURNIQUET SINGLE 44IN (TOURNIQUET CUFF) IMPLANT
DRAPE EXTREMITY T 121X128X90 (DRAPE) ×3 IMPLANT
DRAPE IMP U-DRAPE 54X76 (DRAPES) ×3 IMPLANT
DRAPE U-SHAPE 47X51 STRL (DRAPES) ×3 IMPLANT
DURAPREP 26ML APPLICATOR (WOUND CARE) ×6 IMPLANT
ELECT REM PT RETURN 9FT ADLT (ELECTROSURGICAL) ×3
ELECTRODE REM PT RTRN 9FT ADLT (ELECTROSURGICAL) ×1 IMPLANT
EVACUATOR 1/8 PVC DRAIN (DRAIN) IMPLANT
GAUZE SPONGE 4X4 12PLY STRL (GAUZE/BANDAGES/DRESSINGS) ×6 IMPLANT
GAUZE XEROFORM 1X8 LF (GAUZE/BANDAGES/DRESSINGS) ×3 IMPLANT
GLOVE BIO SURGEON STRL SZ7.5 (GLOVE) ×3 IMPLANT
GLOVE BIO SURGEON STRL SZ8.5 (GLOVE) ×3 IMPLANT
GLOVE BIOGEL PI IND STRL 6.5 (GLOVE) ×1 IMPLANT
GLOVE BIOGEL PI IND STRL 8 (GLOVE) ×2 IMPLANT
GLOVE BIOGEL PI IND STRL 9 (GLOVE) ×2 IMPLANT
GLOVE BIOGEL PI INDICATOR 6.5 (GLOVE) ×2
GLOVE BIOGEL PI INDICATOR 8 (GLOVE) ×4
GLOVE BIOGEL PI INDICATOR 9 (GLOVE) ×4
GLOVE SKINSENSE N 8.5 STRL (GLOVE) ×6 IMPLANT
GLOVE SKINSENSE NS SZ6.5 (GLOVE) ×2
GLOVE SKINSENSE NS SZ7.5 (GLOVE) ×8
GLOVE SKINSENSE STRL SZ6.5 (GLOVE) ×1 IMPLANT
GLOVE SKINSENSE STRL SZ7.5 (GLOVE) ×4 IMPLANT
GOWN STRL REUS W/ TWL LRG LVL3 (GOWN DISPOSABLE) ×1 IMPLANT
GOWN STRL REUS W/ TWL XL LVL3 (GOWN DISPOSABLE) ×2 IMPLANT
GOWN STRL REUS W/TWL LRG LVL3 (GOWN DISPOSABLE) ×2
GOWN STRL REUS W/TWL XL LVL3 (GOWN DISPOSABLE) ×4
HANDPIECE INTERPULSE COAX TIP (DISPOSABLE) ×2
HOOD PEEL AWAY FACE SHEILD DIS (HOOD) ×6 IMPLANT
KIT BASIN OR (CUSTOM PROCEDURE TRAY) ×3 IMPLANT
KIT ROOM TURNOVER OR (KITS) ×3 IMPLANT
MANIFOLD NEPTUNE II (INSTRUMENTS) ×3 IMPLANT
NDL SAFETY ECLIPSE 18X1.5 (NEEDLE) IMPLANT
NEEDLE 22X1 1/2 (OR ONLY) (NEEDLE) ×3 IMPLANT
NEEDLE HYPO 18GX1.5 SHARP (NEEDLE)
NEEDLE SPNL 18GX3.5 QUINCKE PK (NEEDLE) IMPLANT
NS IRRIG 1000ML POUR BTL (IV SOLUTION) ×3 IMPLANT
PACK TOTAL JOINT (CUSTOM PROCEDURE TRAY) ×3 IMPLANT
PACK UNIVERSAL I (CUSTOM PROCEDURE TRAY) ×3 IMPLANT
PAD ARMBOARD 7.5X6 YLW CONV (MISCELLANEOUS) ×6 IMPLANT
PADDING CAST COTTON 6X4 STRL (CAST SUPPLIES) ×3 IMPLANT
SET HNDPC FAN SPRY TIP SCT (DISPOSABLE) ×1 IMPLANT
SPONGE GAUZE 4X4 12PLY STER LF (GAUZE/BANDAGES/DRESSINGS) ×3 IMPLANT
SUCTION FRAZIER TIP 10 FR DISP (SUCTIONS) ×3 IMPLANT
SUT VIC AB 0 CT1 27 (SUTURE) ×2
SUT VIC AB 0 CT1 27XBRD ANBCTR (SUTURE) ×1 IMPLANT
SUT VIC AB 1 CTX 36 (SUTURE) ×2
SUT VIC AB 1 CTX36XBRD ANBCTR (SUTURE) ×1 IMPLANT
SUT VIC AB 2-0 CT1 27 (SUTURE) ×2
SUT VIC AB 2-0 CT1 TAPERPNT 27 (SUTURE) ×1 IMPLANT
SUT VIC AB 3-0 CT1 27 (SUTURE) ×2
SUT VIC AB 3-0 CT1 TAPERPNT 27 (SUTURE) ×1 IMPLANT
SUT VIC AB 3-0 FS2 27 (SUTURE) ×3 IMPLANT
SYR 30ML LL (SYRINGE) ×3 IMPLANT
SYR 50ML LL SCALE MARK (SYRINGE) ×3 IMPLANT
TOWEL OR 17X24 6PK STRL BLUE (TOWEL DISPOSABLE) ×3 IMPLANT
TOWEL OR 17X26 10 PK STRL BLUE (TOWEL DISPOSABLE) ×3 IMPLANT
WATER STERILE IRR 1000ML POUR (IV SOLUTION) ×9 IMPLANT

## 2014-12-31 NOTE — Interval H&P Note (Signed)
History and Physical Interval Note:  12/31/2014 11:01 AM  Wanda [name]  has presented today for surgery, with the diagnosis of OSTEOARTHRITIS LEFT KNEE  The various methods of treatment have been discussed with the patient and family. After consideration of risks, benefits and other options for treatment, the patient has consented to  Procedure(s): TOTAL LEFT KNEE ARTHROPLASTY (Left) as a surgical intervention .  The patient's history has been reviewed, patient examined, no change in status, stable for surgery.  I have reviewed the patient's chart and labs.  Questions were answered to the patient's satisfaction.     Kerin Salen

## 2014-12-31 NOTE — Anesthesia Preprocedure Evaluation (Addendum)
Anesthesia Evaluation  Patient identified by MRN, date of birth, ID band Patient awake    Reviewed: Allergy & Precautions, NPO status , Patient's Chart, lab work & pertinent test results  History of Anesthesia Complications (+) PONV and history of anesthetic complications  Airway Mallampati: II  TM Distance: >3 FB Neck ROM: Full    Dental  (+) Teeth Intact, Dental Advisory Given   Pulmonary former smoker (quit '05),    Pulmonary exam normal       Cardiovascular hypertension, Pt. on medications and Pt. on home beta blockers Normal cardiovascular exam '14 ECHO: EF 60-65%, valves OK   Neuro/Psych PSYCHIATRIC DISORDERS Anxiety Depression Chronic back pain: s/p fusion TIA   GI/Hepatic Neg liver ROS, GERD-  Medicated,  Endo/Other  negative endocrine ROS  Renal/GU negative Renal ROS     Musculoskeletal  (+) Arthritis -, Osteoarthritis,    Abdominal   Peds  Hematology   Anesthesia Other Findings   Reproductive/Obstetrics                          Anesthesia Physical Anesthesia Plan  ASA: III  Anesthesia Plan: General   Post-op Pain Management:    Induction: Intravenous  Airway Management Planned: Oral ETT  Additional Equipment:   Intra-op Plan:   Post-operative Plan: Extubation in OR  Informed Consent: I have reviewed the patients History and Physical, chart, labs and discussed the procedure including the risks, benefits and alternatives for the proposed anesthesia with the patient or authorized representative who has indicated his/her understanding and acceptance.   Dental advisory given  Plan Discussed with: CRNA, Anesthesiologist and Surgeon  Anesthesia Plan Comments:         Anesthesia Quick Evaluation

## 2014-12-31 NOTE — Anesthesia Postprocedure Evaluation (Signed)
Anesthesia Post Note  Patient: Wanda [name]  Procedure(s) Performed: Procedure(s) (LRB): TOTAL LEFT KNEE ARTHROPLASTY (Left)  Anesthesia type: general  Patient location: PACU  Post pain: Pain level controlled  Post assessment: Patient's Cardiovascular Status Stable  Last Vitals:  Filed Vitals:   12/31/14 1537  BP:   Pulse: 92  Temp:   Resp: 15    Post vital signs: Reviewed and stable  Level of consciousness: sedated  Complications: No apparent anesthesia complications

## 2014-12-31 NOTE — Transfer of Care (Signed)
Immediate Anesthesia Transfer of Care Note  Patient: Wanda [name]  Procedure(s) Performed: Procedure(s): TOTAL LEFT KNEE ARTHROPLASTY (Left)  Patient Location: PACU  Anesthesia Type:GA combined with regional for post-op pain  Level of Consciousness: awake, alert , oriented and patient cooperative  Airway & Oxygen Therapy: Patient Spontanous Breathing and Patient connected to nasal cannula oxygen  Post-op Assessment: Report given to RN, Post -op Vital signs reviewed and stable and Patient moving all extremities  Post vital signs: Reviewed and stable  Last Vitals:  Filed Vitals:   12/31/14 1052  BP: 140/73  Pulse: 99  Temp: 37 C  Resp: 20    Complications: No apparent anesthesia complications

## 2014-12-31 NOTE — Progress Notes (Signed)
Orthopedic Tech Progress Note Patient Details:  Wanda [name] Apr 11, 1966 [id number] Off cpm at 6:40 pm Patient ID: [name], female   DOB: [birth year], 49 y.o.   MRN: [id number]   [name] 12/31/2014, 6:39 PM

## 2014-12-31 NOTE — Discharge Instructions (Signed)

## 2014-12-31 NOTE — Op Note (Signed)
PATIENT ID:      Wanda [name]  MRN:     [id number] DOB/AGE:    1965-10-04 / 49 y.o.       OPERATIVE REPORT    DATE OF PROCEDURE:  12/31/2014       PREOPERATIVE DIAGNOSIS:   OSTEOARTHRITIS LEFT KNEE      Estimated body mass index is 40.53 kg/(m^2) as calculated from the following:   Height as of this encounter: 5\' 6"  (1.676 m).   Weight as of this encounter: 113.853 kg (251 lb).                                                        POSTOPERATIVE DIAGNOSIS:   left knee osteoarthritis                                                                      PROCEDURE:  Procedure(s): TOTAL LEFT KNEE ARTHROPLASTY Using DepuyAttune RP implants #5L Femur, #5Tibia, 5 mm Attune RP bearing, 38 Patella     SURGEON: Jeslyn Amsler [name]    ASSISTANT:   [employer]   (Present and scrubbed throughout the case, critical for assistance with exposure, retraction, instrumentation, and closure.)         ANESTHESIA: Spinal, Exparel  EBL: 300  FLUID REPLACEMENT: 1500 crystalloid  TOURNIQUET TIME: 46min  Drains: None  Tranexamic Acid: 2 gm topical   COMPLICATIONS:  None         INDICATIONS FOR PROCEDURE: The patient has  OSTEOARTHRITIS LEFT KNEE, varus deformities, XR shows bone on bone arthritis. Patient has failed all conservative measures including anti-inflammatory medicines, narcotics, attempts at  exercise and weight loss, cortisone injections and viscosupplementation.  Risks and benefits of surgery have been discussed, questions answered.   DESCRIPTION OF PROCEDURE: The patient identified by armband, received  IV antibiotics, in the holding area at [facility]. Patient taken to the operating room, appropriate anesthetic  monitors were attached, and general endotracheal anesthesia induced with  the patient in supine position. Tourniquet  applied high to the operative thigh. Lateral post and foot positioner  applied to the table, the lower extremity was then prepped and draped  in  usual sterile fashion from the ankle to the tourniquet. Time-out procedure was performed. We began the operation, with the knee flexed 100 degrees, by making the anterior midline incision starting at handbreadth above the patella going over the patella 1 cm medial to and 4 cm distal to the tibial tubercle. Small bleeders in the skin and the  subcutaneous tissue identified and cauterized. Transverse retinaculum was incised and reflected medially and a medial parapatellar arthrotomy was accomplished. the patella was everted and theprepatellar fat pad resected. The superficial medial collateral  ligament was then elevated from anterior to posterior along the proximal  flare of the tibia and anterior half of the menisci resected. The knee was hyperflexed exposing bone on bone arthritis. Peripheral and notch osteophytes as well as the cruciate ligaments were then resected. We continued to  work our way around posteriorly along the proximal tibia, and  externally  rotated the tibia subluxing it out from underneath the femur. A McHale  retractor was placed through the notch and a lateral Hohmann retractor  placed, and we then drilled through the proximal tibia in line with the  axis of the tibia followed by an intramedullary guide rod and 2-degree  posterior slope cutting guide. The tibial cutting guide, 3 degree posterior sloped, was pinned into place allowing resection of 5 mm of bone medially and about 9 mm of bone laterally. Satisfied with the tibial resection, we then  entered the distal femur 2 mm anterior to the PCL origin with the  intramedullary guide rod and applied the distal femoral cutting guide  set at 12mm, with 5 degrees of valgus. This was pinned along the  epicondylar axis. At this point, the distal femoral cut was accomplished without difficulty. We then sized for a #5L femoral component and pinned the guide in 3 degrees of external rotation.The chamfer cutting guide was pinned into place. The  anterior, posterior, and chamfer cuts were accomplished without difficulty followed by  the Attune RP box cutting guide and the box cut. We also removed posterior osteophytes from the posterior femoral condyles. At this  time, the knee was brought into full extension. We checked our  extension and flexion gaps and found them symmetric for a 5 mm bearing. Distracting in extension with a lamina spreader, the posterior horns of the menisci were removed, and Exparel, diluted to 60 cc, was injected into the capsule of the knee. The  posterior patella cut was accomplished with the 9.5 mm Attune cutting guide, sized at 31mm dome, and the fixation pegs drilled.The knee  was then once again hyperflexed exposing the proximal tibia. We sized for a #5 tibial base plate, applied the smokestack and the conical reamer followed by the the Delta fin keel punch. We then hammered into place the Attune RP trial femoral component, inserted a  5 mm trial bearing, trial patellar button, and took the knee through range of motion from 0-130 degrees. No thumb pressure was required for patellar  Tracking. At this point, the limb was wrapped with an Esmarch bandage and the tourniquet inflated to 350 mmHg. All trial components were removed, mating surfaces irrigated with pulse lavage, and dried with suction and sponges. A double batch of DePuy HV cement with 1500 mg of Zinacef was mixed and applied to all bony metallic mating surfaces except for the posterior condyles of the femur itself. In order, we  hammered into place the tibial tray and removed excess cement, the femoral component and removed excess cement,  The 42mm  Attune RP bearing  was inserted, and the knee brought to full extension with compression.  The patellar button was clamped into place, and excess cement  removed. While the cement cured the wound was irrigated out with normal saline solution pulse lavage. Ligament stability and patellar tracking were checked and  found to be excellent. The parapatellar arthrotomy was closed with  running #1 Vicryl suture. The subcutaneous tissue with 0 and 2-0 undyed  Vicryl suture, and the skin with running 3-0 SQ vicryl. A dressing of Xeroform,  4 x 4, dressing sponges, Webril, and Ace wrap applied. The patient  awakened, extubated, and taken to recovery room without difficulty.   Wanda [name] 12/31/2014, 2:10 PM

## 2014-12-31 NOTE — Anesthesia Procedure Notes (Addendum)
Procedure Name: Intubation Date/Time: 12/31/2014 12:49 PM Performed by: Julian Reil Pre-anesthesia Checklist: Patient identified, Emergency Drugs available, Suction available and Patient being monitored Patient Re-evaluated:Patient Re-evaluated prior to inductionOxygen Delivery Method: Circle system utilized Preoxygenation: Pre-oxygenation with 100% oxygen Intubation Type: IV induction Ventilation: Mask ventilation without difficulty Laryngoscope Size: Mac and 3 Grade View: Grade II Tube type: Oral Tube size: 7.5 mm Number of attempts: 1 Airway Equipment and Method: Stylet Placement Confirmation: ETT inserted through vocal cords under direct vision,  positive ETCO2 and breath sounds checked- equal and bilateral Secured at: 22 cm Tube secured with: Tape Dental Injury: Teeth and Oropharynx as per pre-operative assessment    Anesthesia Regional Block:  Adductor canal block  Pre-Anesthetic Checklist: ,, timeout performed, Correct Patient, Correct Site, Correct Laterality, Correct Procedure, Correct Position, site marked, Risks and benefits discussed,  Surgical consent,  Pre-op evaluation,  At surgeon's request and post-op pain management  Laterality: Left  Prep: chloraprep       Needles:  Injection technique: Single-shot  Needle Type: Stimulator Needle - 80     Needle Length: 10cm 10 cm Needle Gauge: 21 and 21 G    Additional Needles:  Procedures: ultrasound guided (picture in chart) Adductor canal block Narrative:  Start time: 12/31/2014 12:24 PM End time: 12/31/2014 12:33 PM Injection made incrementally with aspirations every 5 mL.  Performed by: Personally

## 2014-12-31 NOTE — Progress Notes (Signed)
Orthopedic Tech Progress Note Patient Details:  Wanda [name] Jul 08, 1966 [id number]  CPM Left Knee CPM Left Knee: On Left Knee Flexion (Degrees): 40 Left Knee Extension (Degrees): 0 Additional Comments: trapeze bar patient helper Viewed order from doctor's order list  Hildred Priest 12/31/2014, 3:14 PM

## 2015-01-01 ENCOUNTER — Encounter (HOSPITAL_COMMUNITY): Payer: Self-pay | Admitting: Orthopedic Surgery

## 2015-01-01 LAB — CBC
HCT: 33.6 % — ABNORMAL LOW (ref 36.0–46.0)
Hemoglobin: 10.7 g/dL — ABNORMAL LOW (ref 12.0–15.0)
MCH: 25.5 pg — ABNORMAL LOW (ref 26.0–34.0)
MCHC: 31.8 g/dL (ref 30.0–36.0)
MCV: 80.2 fL (ref 78.0–100.0)
Platelets: 219 10*3/uL (ref 150–400)
RBC: 4.19 MIL/uL (ref 3.87–5.11)
RDW: 26.8 % — AB (ref 11.5–15.5)
WBC: 12.3 10*3/uL — AB (ref 4.0–10.5)

## 2015-01-01 LAB — BASIC METABOLIC PANEL
ANION GAP: 11 (ref 5–15)
BUN: 7 mg/dL (ref 6–20)
CALCIUM: 8.7 mg/dL — AB (ref 8.9–10.3)
CO2: 22 mmol/L (ref 22–32)
Chloride: 102 mmol/L (ref 101–111)
Creatinine, Ser: 0.8 mg/dL (ref 0.44–1.00)
GLUCOSE: 165 mg/dL — AB (ref 65–99)
POTASSIUM: 3.5 mmol/L (ref 3.5–5.1)
Sodium: 135 mmol/L (ref 135–145)

## 2015-01-01 NOTE — Progress Notes (Signed)
Physical Therapy Treatment Patient Details Name: Wanda [name] MRN: [id number] DOB: 1966-05-11 Today's Date: 01/01/2015    History of Present Illness Pt is a 49 y/o F s/p L TKA.  Pt's PMH includes lumbar spinal stenosis, L sided weakness 2/2 h/o stroke, lulmbar L5 diskektomy, obesity, depression, fatigue, back surgery x3, L carpal tunnel surgery.    PT Comments    Pt progressed w/ ambulatory distance this session w/o knee buckle; however continue to believe that pt will benefit from L KI for pt's safety 2/2 knee buckle during evaluation.  Ortho tech to deliver [place] per [employer].  Pt will benefit from continued skilled PT services to increase functional independence and safety.  Follow Up Recommendations  Home health PT;Supervision for mobility/OOB     Equipment Recommendations  None recommended by PT    Recommendations for Other Services       Precautions / Restrictions Precautions Precautions: Fall;Knee Precaution Comments: Reviewed no pillow under knee Required Braces or Orthoses:  (contacted MD earlier today about acquiring L KI) Restrictions Weight Bearing Restrictions: Yes LLE Weight Bearing: Weight bearing as tolerated    Mobility  Bed Mobility Overal bed mobility: Needs Assistance Bed Mobility: Supine to Sit     Supine to sit: Min assist     General bed mobility comments: Pt required min assist to slide LLE on bed as sock was gripping sheets  Transfers Overall transfer level: Needs assistance Equipment used: Rolling walker (2 wheeled) Transfers: Sit to/from Stand Sit to Stand: Supervision         General transfer comment: Supervision for safety.  Pt w/ good carryover for hand placement  Ambulation/Gait Ambulation/Gait assistance: Min guard Ambulation Distance (Feet): 45 Feet Assistive device: Rolling walker (2 wheeled) Gait Pattern/deviations: Step-to pattern;Decreased stride length;Decreased stance time - left;Decreased weight shift to  left;Antalgic;Trunk flexed   Gait velocity interpretation: Below normal speed for age/gender General Gait Details: Cues to "tighten" LLE prior to taking step 2/2 previous L knee buckling during evaluation.  Inc WB through [place] w/ trunk flexed.     Stairs            Wheelchair Mobility    Modified Rankin (Stroke Patients Only)       Balance Overall balance assessment: Needs assistance Sitting-balance support: Bilateral upper extremity supported;Feet supported Sitting balance-Leahy Scale: Fair     Standing balance support: Bilateral upper extremity supported;During functional activity Standing balance-Leahy Scale: Fair                      Cognition Arousal/Alertness: Awake/alert Behavior During Therapy: WFL for tasks assessed/performed Overall Cognitive Status: Within Functional Limits for tasks assessed                      Exercises Total Joint Exercises Ankle Circles/Pumps: AROM;Both;10 reps;Supine Quad Sets: AROM;Both;5 reps;Supine Short Arc Quad: AAROM;Left;5 reps;Supine    General Comments        Pertinent Vitals/Pain Pain Assessment: 0-10 Pain Score: 8  Pain Location: L knee Pain Descriptors / Indicators: Aching;Constant;Jabbing;Moaning;Grimacing;Guarding Pain Intervention(s): Limited activity within patient's tolerance;Monitored during session;Repositioned;Ice applied    Home Living Family/patient expects to be discharged to:: Private residence Living Arrangements: Children Available Help at Discharge: Family;Available PRN/intermittently Type of Home: Apartment Home Access: Level entry   Home Layout: One level Home Equipment: Walker - 2 wheels;Bedside commode;Grab bars - tub/shower;Shower seat (wide)      Prior Function Level of Independence: Independent with assistive device(s)  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)   PT Goals (current goals can now be  found in the care plan section) Acute Rehab PT Goals Patient Stated Goal: to have pain reduced Progress towards PT goals: Progressing toward goals    Frequency  7X/week    PT Plan Current plan remains appropriate    Co-evaluation             End of Session Equipment Utilized During Treatment: Gait belt Activity Tolerance: Patient limited by pain Patient left: in bed;in CPM;with call bell/phone within reach     Time: 1333-1413 PT Time Calculation (min) (ACUTE ONLY): 40 min  Charges:  $Gait Training: 23-37 mins $Therapeutic Exercise: 8-22 mins                    G Codes:      Joslyn Hy PT, [place] [phone] Pager: [phone] 01/01/2015, 4:59 PM

## 2015-01-01 NOTE — Evaluation (Signed)
Physical Therapy Evaluation Patient Details Name: Wanda [name] MRN: [id number] DOB: June 19, 1966 Today's Date: 01/01/2015   History of Present Illness  Pt is a 49 y/o F s/p L TKA.  Pt's PMH includes lumbar spinal stenosis, L sided weakness 2/2 h/o stroke, lulmbar L5 diskektomy, obesity, depression, fatigue, back surgery x3, L carpal tunnel surgery.  Clinical Impression  Pt is s/p L TKA resulting in the deficits listed below (see PT Problem List). Pt w/ LLE and LUE weakness at baseline 2/2 h/o stroke.  Pt limited by severe L knee pain and required min guard assist to perform sit<>stand transfer and stand pivot.  Pt would benefit from L knee immobilizer 2/2 weakness at baseline and MD has been contacted w/ request for an order for a KI.  Pt reports she can have assist at home prn.  Pt will benefit from skilled PT to increase their independence and safety with mobility to allow discharge to the venue listed below.     Follow Up Recommendations Home health PT;Supervision for mobility/OOB    Equipment Recommendations  None recommended by PT    Recommendations for Other Services OT consult     Precautions / Restrictions Precautions Precautions: Fall;Knee Precaution Booklet Issued: Yes (comment) Precaution Comments: Reviewed no pillow under knee Restrictions Weight Bearing Restrictions: Yes LLE Weight Bearing: Weight bearing as tolerated      Mobility  Bed Mobility Overal bed mobility: Needs Assistance Bed Mobility: Supine to Sit     Supine to sit: Min assist     General bed mobility comments: Min assist w/ managing LLE to sitting EOB.  Cues for sequencing and pt w/ use of bed rails.  Transfers Overall transfer level: Needs assistance Equipment used: Rolling walker (2 wheeled) Transfers: Sit to/from Stand Sit to Stand: Min guard         General transfer comment: Min guard assist and cues for hand placement.  Upon standing pt reports severe L knee pain and says she wants  to get to the chair.  Ambulation/Gait Ambulation/Gait assistance: Min guard Ambulation Distance (Feet): 3 Feet Assistive device: Rolling walker (2 wheeled) Gait Pattern/deviations: Step-to pattern;Decreased stride length;Decreased weight shift to left;Antalgic   Gait velocity interpretation: Below normal speed for age/gender General Gait Details: Step to, distance limited by severe L knee pain.  L knee buckle as a result and likely 2/2 baseline weakness.   Stairs            Wheelchair Mobility    Modified Rankin (Stroke Patients Only)       Balance Overall balance assessment: Needs assistance Sitting-balance support: Bilateral upper extremity supported;Feet supported Sitting balance-Leahy Scale: Fair     Standing balance support: Bilateral upper extremity supported;During functional activity Standing balance-Leahy Scale: Fair                               Pertinent Vitals/Pain Pain Assessment: 0-10 Pain Score: 10-Worst pain ever Pain Location: L knee Pain Descriptors / Indicators: Aching;Grimacing;Guarding;Moaning Pain Intervention(s): Limited activity within patient's tolerance;Monitored during session;Repositioned    Home Living Family/patient expects to be discharged to:: Private residence Living Arrangements: Children (90 year old daughter) Available Help at Discharge: Family;Available PRN/intermittently Type of Home: Apartment Home Access: Level entry     Home Layout: One level Home Equipment: Walker - 2 wheels;Bedside commode;Grab bars - tub/shower      Prior Function Level of Independence: Independent with assistive device(s)  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)     Hand Dominance   Dominant Hand: Right    Extremity/Trunk Assessment   Upper Extremity Assessment: Defer to OT evaluation           Lower Extremity Assessment: LLE deficits/detail    LLE Deficits / Details: generalized weakness LLE 2/2 weakness at baseline     Communication   Communication: No difficulties  Cognition Arousal/Alertness: Awake/alert Behavior During Therapy: WFL for tasks assessed/performed Overall Cognitive Status: Within Functional Limits for tasks assessed                      General Comments General comments (skin integrity, edema, etc.): Pt limited by severe pain in L knee this session.  Pt would benefit from L knee immobilizer 2/2 weakness at baseline and MD has been contacted w/ request for an order for a KI.    Exercises Total Joint Exercises Ankle Circles/Pumps: AROM;Both;10 reps;Supine Quad Sets: AROM;Both;5 reps;Supine Heel Slides: AAROM;Left;5 reps;Supine Knee Flexion: AROM;Left;5 reps;Seated;AAROM Goniometric ROM: 10-110      Assessment/Plan    PT Assessment Patient needs continued PT services  PT Diagnosis Difficulty walking;Abnormality of gait;Generalized weakness;Acute pain   PT Problem List Decreased strength;Decreased range of motion;Decreased activity tolerance;Decreased mobility;Decreased balance;Decreased coordination;Decreased knowledge of use of DME;Decreased safety awareness;Decreased knowledge of precautions;Impaired sensation;Decreased skin integrity;Pain  PT Treatment Interventions DME instruction;Gait training;Stair training;Therapeutic activities;Functional mobility training;Therapeutic exercise;Balance training;Neuromuscular re-education;Patient/family education;Modalities   PT Goals (Current goals can be found in the Care Plan section) Acute Rehab PT Goals Patient Stated Goal: to have pain reduced PT Goal Formulation: With patient Time For Goal Achievement: 01/08/15 Potential to Achieve Goals: Good    Frequency 7X/week   Barriers to discharge        Co-evaluation               End of Session Equipment Utilized During Treatment: Gait belt Activity Tolerance: Patient limited by pain Patient  left: in chair;with call bell/phone within reach Nurse Communication: Mobility status;Precautions;Weight bearing status         Time: 1751-0258 PT Time Calculation (min) (ACUTE ONLY): 23 min   Charges:   PT Evaluation $Initial PT Evaluation Tier I: 1 Procedure PT Treatments $Therapeutic Exercise: 8-22 mins   PT G Codes:        Joslyn Hy PT, DPT [phone] Pager: [phone]  01/01/2015, 12:20 PM

## 2015-01-01 NOTE — Progress Notes (Signed)
Patient ID: KEVA DARTY, female   DOB: 11-Oct-1965, 49 y.o.   MRN: [id number] PATIENT ID: EULLA KOCHANOWSKI  MRN: [id number]  DOB/AGE:  14-Apr-1966 / 49 y.o.  1 Day Post-Op Procedure(s) (LRB): TOTAL LEFT KNEE ARTHROPLASTY (Left)    PROGRESS NOTE Subjective: Patient is alert, oriented, no Nausea, no Vomiting, yes passing gas, no Bowel Movement. Taking PO well. Denies SOB, Chest or Calf Pain. Using Incentive Spirometer, PAS in place. Ambulate WBAT, CPM 0-40 Patient reports pain as 5 on 0-10 scale  .    Objective: Vital signs in last 24 hours: Filed Vitals:   12/31/14 1638 12/31/14 2009 12/31/14 2011 01/01/15 0655  BP: 106/49 94/56 98/54  125/65  Pulse: 87 80  84  Temp: 98.2 F (36.8 C) 98 F (36.7 C)  98 F (36.7 C)  TempSrc:  Axillary    Resp: 16     Height:      Weight:      SpO2: 94% 96%  98%      Intake/Output from previous day: I/O last 3 completed shifts: In: 1500 [P.O.:50; I.V.:1450] Out: 150 [Blood:150]   Intake/Output this shift:     LABORATORY DATA: No results for input(s): WBC, HGB, HCT, PLT, NA, K, CL, CO2, BUN, CREATININE, GLUCOSE, GLUCAP, INR, CALCIUM in the last 72 hours.  Invalid input(s): PT, 2  Examination: Neurologically intact ABD soft Neurovascular intact Sensation intact distally Intact pulses distally Dorsiflexion/Plantar flexion intact Incision: dressing C/D/I No cellulitis present Compartment soft}  Assessment:   1 Day Post-Op Procedure(s) (LRB): TOTAL LEFT KNEE ARTHROPLASTY (Left) ADDITIONAL DIAGNOSIS: Expected Acute Blood Loss Anemia, HTN, Anxiety  Plan: PT/OT WBAT, CPM 5/hrs day until ROM 0-90 degrees, then D/C CPM DVT Prophylaxis:  SCDx72hrs, ASA 325 mg BID x 2 weeks DISCHARGE PLAN: Home DISCHARGE NEEDS: HHPT, CPM, Walker and 3-in-1 comode seat     Verenise Moulin J 01/01/2015, 7:31 AM

## 2015-01-01 NOTE — Plan of Care (Signed)
Problem: Consults Goal: Diagnosis- Total Joint Replacement Primary Total Knee Left     

## 2015-01-01 NOTE — Progress Notes (Signed)
Orthopedic Tech Progress Note Patient Details:  Wanda [name] 04/14/1966 [id number]  Ortho Devices Type of Ortho Device: Knee Immobilizer Ortho Device/Splint Location: lle Ortho Device/Splint Interventions: Application   [name] 01/01/2015, 4:30 PM

## 2015-01-01 NOTE — Progress Notes (Signed)
OT Cancellation Note  Patient Details Name: Wanda [name] MRN: [id number] DOB: 1966-01-13   Cancelled Treatment:    Reason Eval/Treat Not Completed: Pain limiting ability to participate Pt 10/10 pain. Will return after lunch if able.  Dew[place], OTR/L  [phone] 01/01/2015 01/01/2015, 10:01 AM

## 2015-01-02 LAB — CBC
HCT: 31.8 % — ABNORMAL LOW (ref 36.0–46.0)
Hemoglobin: 10.3 g/dL — ABNORMAL LOW (ref 12.0–15.0)
MCH: 25.5 pg — ABNORMAL LOW (ref 26.0–34.0)
MCHC: 32.4 g/dL (ref 30.0–36.0)
MCV: 78.7 fL (ref 78.0–100.0)
PLATELETS: 185 10*3/uL (ref 150–400)
RBC: 4.04 MIL/uL (ref 3.87–5.11)
RDW: 26.3 % — ABNORMAL HIGH (ref 11.5–15.5)
WBC: 11.6 10*3/uL — ABNORMAL HIGH (ref 4.0–10.5)

## 2015-01-02 NOTE — Progress Notes (Signed)
PATIENT ID: Wanda [name]  MRN: [id number]  DOB/AGE:  [birth year] / 49 y.o.  2 Days Post-Op Procedure(s) (LRB): TOTAL LEFT KNEE ARTHROPLASTY (Left)    PROGRESS NOTE Subjective: Patient is alert, oriented, no Nausea, no Vomiting, yes passing gas, no Bowel Movement. Taking PO well. Denies SOB, Chest or Calf Pain. Using Incentive Spirometer, PAS in place. Ambulate WBAT, CPM 0-40 Patient reports pain as moderate and severe  .    Objective: Vital signs in last 24 hours: Filed Vitals:   01/01/15 0800 01/01/15 1300 01/01/15 1947 01/02/15 0400  BP:  117/46 129/43 132/64  Pulse:  92 96 93  Temp:  98 F (36.7 C) 98.7 F (37.1 C) 98.2 F (36.8 C)  TempSrc:   Oral Oral  Resp:  16 18 18   Height:      Weight:      SpO2: 96% 98% 98% 93%      Intake/Output from previous day: I/O last 3 completed shifts: In: 480 [P.O.:480] Out: -    Intake/Output this shift:     LABORATORY DATA:  Recent Labs  01/01/15 0624 01/02/15 0502  WBC 12.3* 11.6*  HGB 10.7* 10.3*  HCT 33.6* 31.8*  PLT 219 185  NA 135  --   K 3.5  --   CL 102  --   CO2 22  --   BUN 7  --   CREATININE 0.80  --   GLUCOSE 165*  --   CALCIUM 8.7*  --     Examination: Neurologically intact Neurovascular intact Sensation intact distally Intact pulses distally Dorsiflexion/Plantar flexion intact Incision: dressing C/D/I No cellulitis present Compartment soft}  Assessment:   2 Days Post-Op Procedure(s) (LRB): TOTAL LEFT KNEE ARTHROPLASTY (Left) ADDITIONAL DIAGNOSIS: Expected Acute Blood Loss Anemia, Hypertension and Anxiety  Plan: PT/OT WBAT, CPM 5/hrs day until ROM 0-90 degrees, then D/C CPM DVT Prophylaxis:  SCDx72hrs, ASA 325 mg BID x 2 weeks DISCHARGE PLAN: Home, when pt passes therapy goals. DISCHARGE NEEDS: HHPT, HHRN, CPM, Walker and 3-in-1 comode seat     Troye Hiemstra R 01/02/2015, 11:58 AM

## 2015-01-02 NOTE — Progress Notes (Signed)
Orthopedic Tech Progress Note Patient Details:  Wanda [name] [birth year] [id number] On cpm at 1930 Patient ID: [name], female   DOB: 13-Mar-1966, 49 y.o.   MRN: [id number]   [name] 01/02/2015, 7:35 PM

## 2015-01-02 NOTE — Discharge Summary (Signed)
Patient ID: TRAM WRENN MRN: [id number] DOB/AGE: 08/23/1965 49 y.o.  Admit date: 12/31/2014 Discharge date: 01/02/2015  Admission Diagnoses:  Principal Problem:   Primary osteoarthritis of left knee Active Problems:   Arthritis of knee   Discharge Diagnoses:  Same  Past Medical History  Diagnosis Date  . Depression   . Fatigue   . TMJ pain dysfunction syndrome   . Obesity   . Sinusitis   . Hypertension   . Hypercholesterolemia   . Transient ischemic attack (TIA)     left sided weakness  . PONV (postoperative nausea and vomiting)   . Dizziness   . Anxiety   . Chronic headache     migraines  . Arthritis   . History of blood transfusion   . Family history of adverse reaction to anesthesia     Mother- N/V, Son N/V  . Shortness of breath dyspnea     with exertion  . Stroke 10/27/2012    TIA   1- 10/29/11-weakness left side  . Asthma     last time 2015  . GERD (gastroesophageal reflux disease)     " states she aspirates in sleep."  using a second pillow    Surgeries: Procedure(s): TOTAL LEFT KNEE ARTHROPLASTY on 12/31/2014   Consultants:    Discharged Condition: Improved  Hospital Course: JASHAY RODDY is an 49 y.o. female who was admitted 12/31/2014 for operative treatment ofPrimary osteoarthritis of left knee. Patient has severe unremitting pain that affects sleep, daily activities, and work/hobbies. After pre-op clearance the patient was taken to the operating room on 12/31/2014 and underwent  Procedure(s): TOTAL LEFT KNEE ARTHROPLASTY.    Patient was given perioperative antibiotics: Anti-infectives    Start     Dose/Rate Route Frequency Ordered Stop   12/31/14 2200  doxycycline (DORYX) EC tablet 100 mg  Status:  Discontinued     100 mg Oral 2 times daily 12/31/14 1602 12/31/14 1626   12/31/14 2200  doxycycline (VIBRA-TABS) tablet 100 mg     100 mg Oral Every 12 hours 12/31/14 1627     12/31/14 1215  ceFAZolin (ANCEF) IVPB 2 g/50 mL premix     2 g 100  mL/hr over 30 Minutes Intravenous On call to O.R. 12/30/14 1432 12/31/14 1258       Patient was given sequential compression devices, early ambulation, and chemoprophylaxis to prevent DVT.  Patient benefited maximally from hospital stay and there were no complications.    Recent vital signs: Patient Vitals for the past 24 hrs:  BP Temp Temp src Pulse Resp SpO2  01/02/15 0400 132/64 mmHg 98.2 F (36.8 C) Oral 93 18 93 %  01/01/15 1947 (!) 129/43 mmHg 98.7 F (37.1 C) Oral 96 18 98 %  01/01/15 1300 (!) 117/46 mmHg 98 F (36.7 C) - 92 16 98 %     Recent laboratory studies:  Recent Labs  01/01/15 0624 01/02/15 0502  WBC 12.3* 11.6*  HGB 10.7* 10.3*  HCT 33.6* 31.8*  PLT 219 185  NA 135  --   K 3.5  --   CL 102  --   CO2 22  --   BUN 7  --   CREATININE 0.80  --   GLUCOSE 165*  --   CALCIUM 8.7*  --      Discharge Medications:     Medication List    STOP taking these medications        aspirin 325 MG tablet  Replaced by:  aspirin EC  325 MG tablet      TAKE these medications        aspirin EC 325 MG tablet  Take 1 tablet (325 mg total) by mouth 2 (two) times daily.     atenolol 50 MG tablet  Commonly known as:  TENORMIN  Take 50 mg by mouth daily.     busPIRone 15 MG tablet  Commonly known as:  BUSPAR  Take 7.5 mg by mouth 2 (two) times daily.     cyproheptadine 4 MG tablet  Commonly known as:  PERIACTIN  Take 8 mg by mouth 2 (two) times daily.     doxycycline 100 MG EC tablet  Commonly known as:  DORYX  Take 100 mg by mouth 2 (two) times daily.     ferrous sulfate 325 (65 FE) MG tablet  Take 325 mg by mouth 2 (two) times daily with a meal.     fluticasone 50 MCG/ACT nasal spray  Commonly known as:  FLONASE  Place 2 sprays into both nostrils daily.     hydrochlorothiazide 12.5 MG capsule  Commonly known as:  MICROZIDE  Take 12.5 mg by mouth daily.     HYDROcodone-acetaminophen 5-325 MG per tablet  Commonly known as:  NORCO/VICODIN  Take 1-2  tablets by mouth every 6 (six) hours as needed for moderate pain.     metroNIDAZOLE 0.75 % vaginal gel  Commonly known as:  METROGEL  Place 1 Applicatorful vaginally 2 (two) times daily as needed (itching).     neomycin-bacitracin-polymyxin [phone] ointment  Apply 1 application topically daily as needed (rash under chin).     pantoprazole 40 MG tablet  Commonly known as:  PROTONIX  Take 40 mg by mouth daily.     promethazine 25 MG tablet  Commonly known as:  PHENERGAN  Take 25 mg by mouth every 6 (six) hours as needed for nausea.     sertraline 100 MG tablet  Commonly known as:  ZOLOFT  Take 100-200 mg by mouth 2 (two) times daily. Take 100 mg in the am and 200 mg in the pm     tizanidine 2 MG capsule  Commonly known as:  ZANAFLEX  Take 4 mg by mouth 2 (two) times daily.     Vitamin D (Ergocalciferol) 50000 UNITS Caps capsule  Commonly known as:  DRISDOL  Take 50,000 Units by mouth every 7 (seven) days. mondays     zonisamide 100 MG capsule  Commonly known as:  ZONEGRAN  Take 200 mg by mouth 2 (two) times daily.        Diagnostic Studies: No results found.  Disposition: 01-Home or Self Care      Discharge Instructions    CPM    Complete by:  As directed   Continuous passive motion machine (CPM):      Use the CPM from 0 to 60  for 5 hours per day.      You may increase by 10 degrees per day.  You may break it up into 2 or 3 sessions per day.      Use CPM for 2 weeks or until you are told to stop.     Call MD / Call 911    Complete by:  As directed   If you experience chest pain or shortness of breath, CALL 911 and be transported to the hospital emergency room.  If you develope a fever above 101 F, pus (white drainage) or increased drainage or redness at the wound, or calf  pain, call your surgeon's office.     Change dressing    Complete by:  As directed   Change dressing on 5, then change the dressing daily with sterile 4 x 4 inch gauze dressing and apply TED  hose.  You may clean the incision with alcohol prior to redressing.     Constipation Prevention    Complete by:  As directed   Drink plenty of fluids.  Prune juice may be helpful.  You may use a stool softener, such as Colace (over the counter) 100 mg twice a day.  Use MiraLax (over the counter) for constipation as needed.     Diet - low sodium heart healthy    Complete by:  As directed      Discharge instructions    Complete by:  As directed   Follow up in office with Dr. Mayer Camel in 2 weeks.     Driving restrictions    Complete by:  As directed   No driving for 2 weeks     Increase activity slowly as tolerated    Complete by:  As directed      Patient may shower    Complete by:  As directed   You may shower without a dressing once there is no drainage.  Do not wash over the wound.  If drainage remains, cover wound with plastic wrap and then shower.           Follow-up Information    Follow up with Kerin Salen, MD In 2 weeks.   Specialty:  Orthopedic Surgery   Contact information:   [facility] [place] [address] [phone]        Signed: Hardin Negus Aydrien Froman R 01/02/2015, 12:01 PM

## 2015-01-02 NOTE — Care Management Note (Signed)
Case Management Note  Patient Details  Name: Wanda [name] MRN: [id number] Date of Birth: Nov 28, 1965  Subjective/Objective:                 S/p left total knee arthroplasty   Action/Plan:    Set up with [facility] for HHPT by MD office. Spoke with patient, no change in discharge plan. Patient stated that she has a rolling walker at home and that her daughter will be available to assist her after discharge. [employer] has delivered CPM and 3N1 to her home.   Expected Discharge Date:                  Expected Discharge Plan:  [place]  In-House Referral:  NA  Discharge planning Services  CM Consult  Post Acute Care Choice:  Durable Medical Equipment, Home Health Choice offered to:  Patient  DME Arranged:  CPM, 3-N-1 DME Agency:  [employer]  HH Arranged:  PT HH Agency:  [facility]  Status of Service:  Completed, signed off  Medicare Important Message Given:    Date Medicare IM Given:    Medicare IM give by:    Date Additional Medicare IM Given:    Additional Medicare Important Message give by:     If discussed at [place] of Stay Meetings, dates discussed:    Additional Comments:  Nila Nephew, RN 01/02/2015, 1:20 PM

## 2015-01-02 NOTE — Progress Notes (Signed)
Orthopedic Tech Progress Note Patient Details:  Wanda [name] 1966/05/16 [id number]  Patient ID: [name], female   DOB: [birth year], 49 y.o.   MRN: [id number] Placed pt's lle on cpm @ 0-50 DEGREES @1425   Hildred Priest 01/02/2015, 2:23 PM

## 2015-01-02 NOTE — Progress Notes (Signed)
Per RN report pt has D/C orders for 6/8 but patient stated that PA Joanell Rising verbally told patient that she could stay another night if pt needed. PA's note states that pt can d/c when therapy goals met. PT note states that they will continue to see pt. This RN called PA on call and received call back from Grier Mitts stating that this was acceptable and the patient can remain in patient for another night. Nursing will continue to monitor.

## 2015-01-02 NOTE — Progress Notes (Signed)
Physical Therapy Treatment Patient Details Name: Wanda [name] MRN: [id number] DOB: 1966-05-06 Today's Date: 01/02/2015    History of Present Illness Pt is a 49 y/o F s/p L TKA.  Pt's PMH includes lumbar spinal stenosis, L sided weakness 2/2 h/o stroke, lulmbar L5 diskektomy, obesity, depression, fatigue, back surgery x3, L carpal tunnel surgery.    PT Comments    Patient making steady progress towards PT goals at this time. Tolerated increased ambulation in hall today with RW and supervision. Patient also able to perform stair negotiation without difficulty. OF NOTE: patient continues to demonstrate significant quad lag, encouraged short arc quads to improve strength and proprioception. Will continue to see and progress as tolerated.    Follow Up Recommendations  Home health PT;Supervision for mobility/OOB     Equipment Recommendations  None recommended by PT    Recommendations for Other Services       Precautions / Restrictions Precautions Precautions: Fall;Knee Precaution Comments: Reviewed no pillow under knee Required Braces or Orthoses:  (contacted MD earlier today about acquiring L KI) Restrictions Weight Bearing Restrictions: Yes LLE Weight Bearing: Weight bearing as tolerated    Mobility  Bed Mobility               General bed mobility comments: received in chair  Transfers Overall transfer level: Needs assistance Equipment used: Rolling walker (2 wheeled) Transfers: Sit to/from Stand Sit to Stand: Supervision         General transfer comment: Supervision for safety.  Pt w/ good carryover for hand placement  Ambulation/Gait Ambulation/Gait assistance: Supervision Ambulation Distance (Feet): 170 Feet Assistive device: Rolling walker (2 wheeled) Gait Pattern/deviations: Step-through pattern;Decreased stride length;Antalgic Gait velocity: decreased but improving Gait velocity interpretation: Below normal speed for age/gender General Gait  Details: VCs for increased cadence and normalized gait during mobility. Patient continues to demonstrate significant quad lag, therefore, ambulated with KI on. Patient encouraged to increased step thru gait pattern.    Stairs Stairs: Yes Stairs assistance: Supervision Stair Management: Two rails;Step to pattern;Forwards Number of Stairs: 5 General stair comments: educated on sequencing and technique, performed without difficulty  Wheelchair Mobility    Modified Rankin (Stroke Patients Only)       Balance   Sitting-balance support: Bilateral upper extremity supported Sitting balance-Leahy Scale: Fair     Standing balance support: Bilateral upper extremity supported Standing balance-Leahy Scale: Fair                      Cognition Arousal/Alertness: Awake/alert Behavior During Therapy: WFL for tasks assessed/performed Overall Cognitive Status: Within Functional Limits for tasks assessed                      Exercises Total Joint Exercises Ankle Circles/Pumps: AROM;Both;10 reps;Supine    General Comments        Pertinent Vitals/Pain Pain Assessment: 0-10 Pain Score: 6  Pain Location: left knee Pain Descriptors / Indicators: Aching;Constant;Sore Pain Intervention(s): Monitored during session;Repositioned;Relaxation    Home Living                      Prior Function            PT Goals (current goals can now be found in the care plan section) Acute Rehab PT Goals Patient Stated Goal: to get back home PT Goal Formulation: With patient Time For Goal Achievement: 01/08/15 Potential to Achieve Goals: Good Progress towards PT goals: Progressing toward goals  Frequency  7X/week    PT Plan Current plan remains appropriate    Co-evaluation             End of Session Equipment Utilized During Treatment: Gait belt Activity Tolerance: Patient limited by pain Patient left:  (in gym with OT)     Time: 1694-5038 PT Time  Calculation (min) (ACUTE ONLY): 14 min  Charges:  $Gait Training: 8-22 mins                    G CodesDuncan Dull 03-Jan-2015, 11:55 AM Alben Deeds, PT DPT  [phone]

## 2015-01-02 NOTE — Progress Notes (Signed)
Occupational Therapy Treatment Patient Details Name: Wanda [name] MRN: [id number] DOB: [birth year] Today's Date: 01/02/2015    History of present illness Pt is a 49 y/o F s/p L TKA.  Pt's PMH includes lumbar spinal stenosis, L sided weakness 2/2 h/o stroke, lulmbar L5 diskektomy, obesity, depression, fatigue, back surgery x3, L carpal tunnel surgery.   OT comments  Pt is making progress with functional mobility and ADLs and practiced tub transfer with 3N1. Pt will continue to benefit from acute OT per POC to progress to Mod I level.    Follow Up Recommendations  No OT follow up;Supervision - Intermittent    Equipment Recommendations  None recommended by OT    Recommendations for Other Services      Precautions / Restrictions Precautions Precautions: Fall;Knee Precaution Comments: Reviewed no pillow under knee Required Braces or Orthoses: Knee Immobilizer - Left Restrictions Weight Bearing Restrictions: Yes LLE Weight Bearing: Weight bearing as tolerated       Mobility Bed Mobility               General bed mobility comments: Pt received in gym from PT  Transfers Overall transfer level: Needs assistance Equipment used: Rolling walker (2 wheeled) Transfers: Sit to/from Stand Sit to Stand: Supervision         General transfer comment: Supervision for safety.  Pt w/ good carryover for hand placement        ADL Overall ADL's : Needs assistance/impaired     Grooming: Set up;Supervision/safety;Standing       Lower Body Bathing: Minimal assistance;Sit to/from stand       Lower Body Dressing: Sit to/from stand;Minimal assistance   Toilet Transfer: Supervision/safety;Ambulation;BSC;RW       Tub/ Shower Transfer: Tub transfer;Min guard;Ambulation;3 in 1;Rolling walker   Functional mobility during ADLs: Supervision/safety;Rolling walker General ADL Comments: Pt practiced tub transfer with use of 3N1 (which she has) and RW. Pt required min guard and  educated on technique. Continues to need assist for LB ADLs and reinforced use of AE for LB ADLs.                 Cognition   Arousal/Alertness: Awake/Alert  Behavior During Therapy: WFL for tasks assessed/performed Overall Cognitive Status: Within Functional Limits for tasks assessed                                    Pertinent Vitals/ Pain       Pain Assessment: 0-10 Pain Score: 6  Pain Location:  L knee Pain Descriptors / Indicators: Aching;Sore Pain Intervention(s): Monitored during session;Repositioned         Frequency Min 2X/week     Progress Toward Goals  OT Goals(current goals can now be found in the care plan section)  Progress towards OT goals: Progressing toward goals  Acute Rehab OT Goals Patient Stated Goal: to get back home  Plan Discharge plan remains appropriate       End of Session Equipment Utilized During Treatment: Rolling walker;Left knee immobilizer   Activity Tolerance Patient tolerated treatment well   Patient Left in chair;with call bell/phone within reach   Nurse Communication          Time: 6789-3810 OT Time Calculation (min): 24 min  Charges: OT General Charges $OT Visit: 1 Procedure OT Treatments $Self Care/Home Management : 23-37 mins  Juluis Rainier 01/02/2015, 12:27 PM  Secundino Ginger Lynetta Mare, OTR/L Occupational Therapist  [phone] (pager)

## 2015-01-03 LAB — CBC
HCT: 31.9 % — ABNORMAL LOW (ref 36.0–46.0)
Hemoglobin: 10.1 g/dL — ABNORMAL LOW (ref 12.0–15.0)
MCH: 25.3 pg — ABNORMAL LOW (ref 26.0–34.0)
MCHC: 31.7 g/dL (ref 30.0–36.0)
MCV: 79.8 fL (ref 78.0–100.0)
Platelets: 185 10*3/uL (ref 150–400)
RBC: 4 MIL/uL (ref 3.87–5.11)
RDW: 26.4 % — ABNORMAL HIGH (ref 11.5–15.5)
WBC: 10.4 10*3/uL (ref 4.0–10.5)

## 2015-01-03 NOTE — Progress Notes (Signed)
Patient ID: Wanda [name], female   DOB: 11-07-1965, 49 y.o.   MRN: [id number] PATIENT ID: Wanda [name]  MRN: [id number]  DOB/AGE:  Mar 18, 1966 / 49 y.o.  3 Days Post-Op Procedure(s) (LRB): TOTAL LEFT KNEE ARTHROPLASTY (Left)    PROGRESS NOTE Subjective: Patient is alert, oriented, no Nausea, no Vomiting, yes passing gas, no Bowel Movement. Taking PO well. Denies SOB, Chest or Calf Pain. Using Incentive Spirometer, PAS in place. Ambulate WBAT, CPM 0-60 Patient reports pain as 2 on 0-10 scale  .    Objective: Vital signs in last 24 hours: Filed Vitals:   01/02/15 0400 01/02/15 1400 01/02/15 2025 01/03/15 0532  BP: 132/64 103/57 104/50 106/43  Pulse: 93 86 94 94  Temp: 98.2 F (36.8 C) 98.6 F (37 C) 98.6 F (37 C) 98.6 F (37 C)  TempSrc: Oral  Oral Oral  Resp: 18 16 14 16   Height:      Weight:      SpO2: 93% 98% 99% 100%      Intake/Output from previous day:     Intake/Output this shift:     LABORATORY DATA:  Recent Labs  01/01/15 0624 01/02/15 0502 01/03/15 0520  WBC 12.3* 11.6* 10.4  HGB 10.7* 10.3* 10.1*  HCT 33.6* 31.8* 31.9*  PLT 219 185 185  NA 135  --   --   K 3.5  --   --   CL 102  --   --   CO2 22  --   --   BUN 7  --   --   CREATININE 0.80  --   --   GLUCOSE 165*  --   --   CALCIUM 8.7*  --   --     Examination: Neurologically intact ABD soft Neurovascular intact Sensation intact distally Intact pulses distally Dorsiflexion/Plantar flexion intact Incision: dressing C/D/I No cellulitis present Compartment soft}  Assessment:   3 Days Post-Op Procedure(s) (LRB): TOTAL LEFT KNEE ARTHROPLASTY (Left) ADDITIONAL DIAGNOSIS: Expected Acute Blood Loss Anemia, anxiety  Plan: PT/OT WBAT, CPM 5/hrs day until ROM 0-90 degrees, then D/C CPM DVT Prophylaxis:  SCDx72hrs, ASA 325 mg BID x 2 weeks DISCHARGE PLAN: Home DISCHARGE NEEDS: HHPT, CPM, Walker and 3-in-1 comode seat     Chancelor Hardrick J 01/03/2015, 7:39 AM

## 2015-01-03 NOTE — Progress Notes (Signed)
Physical Therapy Treatment Patient Details Name: Wanda [name] MRN: [id number] DOB: April 18, 1966 Today's Date: 01/03/2015    History of Present Illness Pt is a 49 y/o F s/p L TKA.  Pt's PMH includes lumbar spinal stenosis, L sided weakness 2/2 h/o stroke, lulmbar L5 diskektomy, obesity, depression, fatigue, back surgery x3, L carpal tunnel surgery.    PT Comments    Patient progressing well with ambulation. Stated she was eager to DC home and will have assistance from her daughter and her mother. Patient safe to D/C from a mobility standpoint based on progression towards goals set on PT eval.    Follow Up Recommendations  Home health PT;Supervision for mobility/OOB     Equipment Recommendations  None recommended by PT    Recommendations for Other Services       Precautions / Restrictions Precautions Precautions: Knee Required Braces or Orthoses: Knee Immobilizer - Left Restrictions LLE Weight Bearing: Weight bearing as tolerated    Mobility  Bed Mobility Overal bed mobility: Needs Assistance Bed Mobility: Supine to Sit     Supine to sit: Supervision        Transfers Overall transfer level: Needs assistance Equipment used: Rolling walker (2 wheeled)   Sit to Stand: Supervision            Ambulation/Gait Ambulation/Gait assistance: Supervision Ambulation Distance (Feet): 300 Feet Assistive device: Rolling walker (2 wheeled) Gait Pattern/deviations: Step-through pattern;Decreased stride length Gait velocity: increasing       Stairs            Wheelchair Mobility    Modified Rankin (Stroke Patients Only)       Balance                                    Cognition Arousal/Alertness: Awake/alert Behavior During Therapy: WFL for tasks assessed/performed Overall Cognitive Status: Within Functional Limits for tasks assessed                      Exercises      General Comments        Pertinent Vitals/Pain  Pain Score: 4  Pain Location: L knee Pain Descriptors / Indicators: Sore Pain Intervention(s): Monitored during session    Home Living                      Prior Function            PT Goals (current goals can now be found in the care plan section) Progress towards PT goals: Progressing toward goals    Frequency  7X/week    PT Plan Current plan remains appropriate    Co-evaluation             End of Session   Activity Tolerance: Patient tolerated treatment well Patient left: in chair;with call bell/phone within reach     Time: 0840-0906 PT Time Calculation (min) (ACUTE ONLY): 26 min  Charges:  $Gait Training: 23-37 mins                    G Codes:      Jacqualyn Posey 01/03/2015, 10:17 AM  01/03/2015 Jacqualyn Posey PTA [phone] pager [phone] office

## 2015-02-22 ENCOUNTER — Ambulatory Visit: Payer: Self-pay | Admitting: Surgery

## 2015-02-22 NOTE — H&P (Signed)
  History of Present Illness [name]. Con Arganbright MD; 02/22/2015 1:22 PM) Patient words: scar revision.  The patient is a 49 year old female who presents with a complaint of Mass. This is a 49 year old female who is status post laparoscopic cholecystectomy by Dr. Ninfa Linden in 2011. Prior to that time, the patient had had diagnostic laparoscopy by a GYN through her umbilicus. She developed a large thick keloid in this area. It is very difficult to keep her umbilicus clean. The incision above the umbilicus from Dr. Trevor Mace surgery healed nicely. She has had some drainage from this area intermittently, but it dried up after using hydrogen peroxide and a cotton wick. 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 She would like to have this umbilicus removed so that it will stop draining. Allergies (Ammie Eversole, LPN; 1/74/0814 48:18 AM) Percocet *ANALGESICS - OPIOID* Lisinopril *ANTIHYPERTENSIVES* Latex Exam Gloves *MEDICAL DEVICES AND SUPPLIES*  Medication History (Ammie Eversole, LPN; 5/63/1497 02:63 AM) Doxycycline Hyclate (100MG  Tablet, 1 (one) Tablet Oral two times daily, Taken starting 12/19/2014) Active. Hydrochlorothiazide (12.5MG  Capsule, Oral) Active. Atenolol (50MG  Tablet, Oral) Active. Fluticasone Propionate (50MCG/ACT Suspension, Nasal) Active. Ferrous Sulfate (324 (65 Fe)MG Tablet DR, Oral) Active. Pantoprazole Sodium (40MG  Tablet DR, Oral) Active. Vitamin D (Cholecalciferol) (1000UNIT Tablet, Oral) Active. MetroNIDAZOLE (0.75% Cream, External) Active. Zonisamide (100MG  Capsule, Oral) Active. Cyproheptadine HCl (4MG  Tablet, Oral) Active. Promethazine HCl (25MG  Tablet, Oral) Active. TiZANidine HCl (4MG  Capsule, Oral) Active. Sertraline HCl (100MG  Tablet, Oral) Active. BusPIRone HCl (15MG  Tablet, Oral) Active. Doxycycline Hyclate (50MG  Capsule, Oral) Active. Hydrocodone-Acetaminophen (5-325MG  Tablet, Oral) Active. Robaxin  (500MG  Tablet, Oral) Active. Aspirin (81MG  Tablet Chewable, Oral) Active. Medications Reconciled    Vitals (Ammie Eversole LPN; 7/85/8850 27:74 AM) 02/22/2015 10:57 AM Weight: 243 lb Height: 66in Body Surface Area: 2.27 m Body Mass Index: 39.22 kg/m Pulse: 72 (Regular)  BP: 118/78 (Sitting, Left Arm, Standard)     Physical Exam Rodman Key K. Macon Lesesne MD; 02/22/2015 1:23 PM)  The physical exam findings are as follows: Note:WDWN in NAD  HEENT: EOMI, sclera anicteric Neck: No masses, no thyromegaly Lungs: CTA bilaterally; normal respiratory effort CV: Regular rate and rhythm; no murmurs Abd - soft, non-tender The scar above the umbilicus is flat and well-healed. Within the umbilicus, there is very thick keloid scarring and it is impossible to see the base of the umbilicus. Ext: Well-perfused; no edema Skin: Warm, dry; no sign of jaundice    Assessment & Plan Rodman Key K. Wendle Kina MD; 08/23/7865 67:20 AM)  UMBILICAL DISCHARGE (947.0  R19.8)  Current Plans Schedule for Surgery - Umbilectomy. The surgical procedure has been discussed with the patient. Potential risks, benefits, alternative treatments, and expected outcomes have been explained. All of the patient's questions at this time have been answered. The likelihood of reaching the patient's treatment goal is good. The patient understand the proposed surgical procedure and wishes to proceed.   [name]. Georgette Dover, MD, [facility] Surgery  General/ Trauma Surgery  02/22/2015 1:24 PM

## 2015-03-28 DIAGNOSIS — L910 Hypertrophic scar: Secondary | ICD-10-CM

## 2015-03-28 HISTORY — DX: Hypertrophic scar: L91.0

## 2015-03-29 HISTORY — PX: CARPAL TUNNEL RELEASE: SHX101

## 2015-04-15 ENCOUNTER — Encounter (HOSPITAL_BASED_OUTPATIENT_CLINIC_OR_DEPARTMENT_OTHER): Payer: Self-pay | Admitting: *Deleted

## 2015-04-15 NOTE — Pre-Procedure Instructions (Signed)
To come for BMET 

## 2015-04-19 ENCOUNTER — Encounter (HOSPITAL_BASED_OUTPATIENT_CLINIC_OR_DEPARTMENT_OTHER)
Admission: RE | Admit: 2015-04-19 | Discharge: 2015-04-19 | Disposition: A | Discharge status: Home / Self Care | Payer: BLUE CROSS/BLUE SHIELD | Source: Ambulatory Visit | Attending: Surgery | Admitting: Surgery

## 2015-04-19 DIAGNOSIS — I10 Essential (primary) hypertension: Secondary | ICD-10-CM | POA: Diagnosis not present

## 2015-04-19 DIAGNOSIS — Z7982 Long term (current) use of aspirin: Secondary | ICD-10-CM | POA: Diagnosis not present

## 2015-04-19 DIAGNOSIS — L910 Hypertrophic scar: Secondary | ICD-10-CM | POA: Diagnosis present

## 2015-04-19 DIAGNOSIS — Z87891 Personal history of nicotine dependence: Secondary | ICD-10-CM | POA: Diagnosis not present

## 2015-04-19 LAB — BASIC METABOLIC PANEL
Anion gap: 8 (ref 5–15)
BUN: 11 mg/dL (ref 6–20)
CHLORIDE: 109 mmol/L (ref 101–111)
CO2: 28 mmol/L (ref 22–32)
Calcium: 9.7 mg/dL (ref 8.9–10.3)
Creatinine, Ser: 0.84 mg/dL (ref 0.44–1.00)
GFR calc Af Amer: >60 mL/min (ref >60)
GLUCOSE: 108 mg/dL — AB (ref 65–99)
POTASSIUM: 4.1 mmol/L (ref 3.5–5.1)
Sodium: 145 mmol/L (ref 135–145)

## 2015-04-24 ENCOUNTER — Ambulatory Visit (HOSPITAL_BASED_OUTPATIENT_CLINIC_OR_DEPARTMENT_OTHER): Payer: BLUE CROSS/BLUE SHIELD | Admitting: Anesthesiology

## 2015-04-24 ENCOUNTER — Ambulatory Visit (HOSPITAL_BASED_OUTPATIENT_CLINIC_OR_DEPARTMENT_OTHER)
Admission: RE | Admit: 2015-04-24 | Discharge: 2015-04-24 | Disposition: A | Discharge status: Home / Self Care | Payer: BLUE CROSS/BLUE SHIELD | Source: Ambulatory Visit | Attending: Surgery | Admitting: Surgery

## 2015-04-24 ENCOUNTER — Encounter (HOSPITAL_BASED_OUTPATIENT_CLINIC_OR_DEPARTMENT_OTHER): Payer: Self-pay | Admitting: Anesthesiology

## 2015-04-24 ENCOUNTER — Encounter (HOSPITAL_BASED_OUTPATIENT_CLINIC_OR_DEPARTMENT_OTHER)
Admission: RE | Disposition: A | Discharge status: Home / Self Care | Payer: Self-pay | Source: Ambulatory Visit | Attending: Surgery

## 2015-04-24 DIAGNOSIS — Z7982 Long term (current) use of aspirin: Secondary | ICD-10-CM | POA: Insufficient documentation

## 2015-04-24 DIAGNOSIS — L910 Hypertrophic scar: Secondary | ICD-10-CM | POA: Insufficient documentation

## 2015-04-24 DIAGNOSIS — Z87891 Personal history of nicotine dependence: Secondary | ICD-10-CM | POA: Insufficient documentation

## 2015-04-24 DIAGNOSIS — I10 Essential (primary) hypertension: Secondary | ICD-10-CM | POA: Insufficient documentation

## 2015-04-24 HISTORY — DX: Other seasonal allergic rhinitis: J30.2

## 2015-04-24 HISTORY — DX: Acne, unspecified: L70.9

## 2015-04-24 HISTORY — DX: Anemia, unspecified: D64.9

## 2015-04-24 HISTORY — DX: Personal history of transient ischemic attack (TIA), and cerebral infarction without residual deficits: Z86.73

## 2015-04-24 HISTORY — DX: Unspecified chronic bronchitis: J42

## 2015-04-24 HISTORY — DX: Hypertrophic scar: L91.0

## 2015-04-24 HISTORY — DX: Migraine, unspecified, not intractable, without status migrainosus: G43.909

## 2015-04-24 HISTORY — DX: Unspecified osteoarthritis, unspecified site: M19.90

## 2015-04-24 SURGERY — EXCISION, UMBILICUS
Anesthesia: General | Site: Abdomen

## 2015-04-24 MED ORDER — FENTANYL CITRATE (PF) 100 MCG/2ML IJ SOLN
INTRAMUSCULAR | Status: AC
  Filled 2015-04-24: qty 2

## 2015-04-24 MED ORDER — MIDAZOLAM HCL 5 MG/5ML IJ SOLN
INTRAMUSCULAR | Status: DC | PRN
  Administered 2015-04-24: 2 mg via INTRAVENOUS

## 2015-04-24 MED ORDER — DEXAMETHASONE SODIUM PHOSPHATE 4 MG/ML IJ SOLN
INTRAMUSCULAR | Status: DC | PRN
  Administered 2015-04-24: 10 mg via INTRAVENOUS

## 2015-04-24 MED ORDER — FENTANYL CITRATE (PF) 100 MCG/2ML IJ SOLN
INTRAMUSCULAR | Status: AC
  Filled 2015-04-24: qty 4

## 2015-04-24 MED ORDER — FENTANYL CITRATE (PF) 100 MCG/2ML IJ SOLN
INTRAMUSCULAR | Status: DC | PRN
  Administered 2015-04-24: 100 ug via INTRAVENOUS

## 2015-04-24 MED ORDER — HYDROCODONE-ACETAMINOPHEN 5-325 MG PO TABS
ORAL_TABLET | ORAL | Status: AC
  Filled 2015-04-24: qty 1

## 2015-04-24 MED ORDER — GLYCOPYRROLATE 0.2 MG/ML IJ SOLN: 0.2000 mg | Freq: Once | INTRAMUSCULAR | Status: DC | PRN

## 2015-04-24 MED ORDER — PROPOFOL 10 MG/ML IV BOLUS
INTRAVENOUS | Status: DC | PRN
  Administered 2015-04-24: 200 mg via INTRAVENOUS

## 2015-04-24 MED ORDER — DEXAMETHASONE SODIUM PHOSPHATE 10 MG/ML IJ SOLN
INTRAMUSCULAR | Status: AC
  Filled 2015-04-24: qty 1

## 2015-04-24 MED ORDER — CHLORHEXIDINE GLUCONATE 4 % EX LIQD: 1.0000 "application " | Freq: Once | CUTANEOUS | Status: DC

## 2015-04-24 MED ORDER — LIDOCAINE HCL (CARDIAC) 20 MG/ML IV SOLN
INTRAVENOUS | Status: DC | PRN
  Administered 2015-04-24: 50 mg via INTRAVENOUS

## 2015-04-24 MED ORDER — FENTANYL CITRATE (PF) 100 MCG/2ML IJ SOLN: 50.0000 ug | INTRAMUSCULAR | Status: DC | PRN

## 2015-04-24 MED ORDER — BUPIVACAINE-EPINEPHRINE (PF) 0.25% -1:200000 IJ SOLN
INTRAMUSCULAR | Status: AC
  Filled 2015-04-24: qty 30

## 2015-04-24 MED ORDER — PROPOFOL 10 MG/ML IV BOLUS
INTRAVENOUS | Status: AC
  Filled 2015-04-24: qty 20

## 2015-04-24 MED ORDER — CEFAZOLIN SODIUM-DEXTROSE 2-3 GM-% IV SOLR
INTRAVENOUS | Status: AC
  Filled 2015-04-24: qty 50

## 2015-04-24 MED ORDER — HYDROCODONE-ACETAMINOPHEN 5-325 MG PO TABS
1.0000 | ORAL_TABLET | Freq: Once | ORAL | Status: AC
  Administered 2015-04-24: 1 via ORAL

## 2015-04-24 MED ORDER — SCOPOLAMINE 1 MG/3DAYS TD PT72
MEDICATED_PATCH | TRANSDERMAL | Status: AC
  Filled 2015-04-24: qty 1

## 2015-04-24 MED ORDER — MORPHINE SULFATE (PF) 2 MG/ML IV SOLN
2.0000 mg | INTRAVENOUS | Status: DC | PRN
Start: 2015-04-24 — End: 2015-04-24

## 2015-04-24 MED ORDER — SCOPOLAMINE 1 MG/3DAYS TD PT72: 1.0000 | MEDICATED_PATCH | Freq: Once | TRANSDERMAL | Status: DC | PRN

## 2015-04-24 MED ORDER — MIDAZOLAM HCL 2 MG/2ML IJ SOLN: 1.0000 mg | INTRAMUSCULAR | Status: DC | PRN

## 2015-04-24 MED ORDER — CEFAZOLIN SODIUM-DEXTROSE 2-3 GM-% IV SOLR
2.0000 g | INTRAVENOUS | Status: AC
  Administered 2015-04-24: 2 g via INTRAVENOUS

## 2015-04-24 MED ORDER — HYDROCODONE-ACETAMINOPHEN 5-325 MG PO TABS: 1.0000 | ORAL_TABLET | ORAL | Status: DC | PRN

## 2015-04-24 MED ORDER — ONDANSETRON HCL 4 MG/2ML IJ SOLN
INTRAMUSCULAR | Status: AC
  Filled 2015-04-24: qty 2

## 2015-04-24 MED ORDER — SCOPOLAMINE 1 MG/3DAYS TD PT72
MEDICATED_PATCH | TRANSDERMAL | Status: DC | PRN
  Administered 2015-04-24: 1 via TRANSDERMAL

## 2015-04-24 MED ORDER — ONDANSETRON HCL 4 MG/2ML IJ SOLN
INTRAMUSCULAR | Status: DC | PRN
  Administered 2015-04-24: 4 mg via INTRAVENOUS

## 2015-04-24 MED ORDER — BUPIVACAINE-EPINEPHRINE 0.25% -1:200000 IJ SOLN
INTRAMUSCULAR | Status: DC | PRN
  Administered 2015-04-24: 10 mL

## 2015-04-24 MED ORDER — ONDANSETRON HCL 4 MG/2ML IJ SOLN: 4.0000 mg | INTRAMUSCULAR | Status: DC | PRN

## 2015-04-24 MED ORDER — LIDOCAINE HCL (CARDIAC) 20 MG/ML IV SOLN
INTRAVENOUS | Status: AC
  Filled 2015-04-24: qty 5

## 2015-04-24 MED ORDER — FENTANYL CITRATE (PF) 100 MCG/2ML IJ SOLN
25.0000 ug | INTRAMUSCULAR | Status: DC | PRN
  Administered 2015-04-24: 25 ug via INTRAVENOUS
  Administered 2015-04-24: 50 ug via INTRAVENOUS

## 2015-04-24 MED ORDER — MIDAZOLAM HCL 2 MG/2ML IJ SOLN
INTRAMUSCULAR | Status: AC
  Filled 2015-04-24: qty 4

## 2015-04-24 MED ORDER — LACTATED RINGERS IV SOLN
INTRAVENOUS | Status: DC
  Administered 2015-04-24 (×2): via INTRAVENOUS

## 2015-04-24 SURGICAL SUPPLY — 52 items
APPLICATOR COTTON TIP 6IN STRL (MISCELLANEOUS) IMPLANT
BENZOIN TINCTURE PRP APPL 2/3 (GAUZE/BANDAGES/DRESSINGS) ×3 IMPLANT
BLADE CLIPPER SURG (BLADE) IMPLANT
BLADE HEX COATED 2.75 (ELECTRODE) ×3 IMPLANT
BLADE SURG 15 STRL LF DISP TIS (BLADE) ×1 IMPLANT
BLADE SURG 15 STRL SS (BLADE) ×2
CANISTER SUCT 1200ML W/VALVE (MISCELLANEOUS) IMPLANT
CHLORAPREP W/TINT 26ML (MISCELLANEOUS) ×3 IMPLANT
CLOSURE WOUND 1/2 X4 (GAUZE/BANDAGES/DRESSINGS) ×1
COVER BACK TABLE 60X90IN (DRAPES) ×3 IMPLANT
COVER MAYO STAND STRL (DRAPES) ×3 IMPLANT
DECANTER SPIKE VIAL GLASS SM (MISCELLANEOUS) IMPLANT
DRAPE LAPAROTOMY T 102X78X121 (DRAPES) ×3 IMPLANT
DRAPE UTILITY XL STRL (DRAPES) ×3 IMPLANT
DRSG TEGADERM 4X4.75 (GAUZE/BANDAGES/DRESSINGS) ×3 IMPLANT
ELECT REM PT RETURN 9FT ADLT (ELECTROSURGICAL) ×3
ELECTRODE REM PT RTRN 9FT ADLT (ELECTROSURGICAL) ×1 IMPLANT
GLOVE BIO SURGEON STRL SZ7 (GLOVE) IMPLANT
GLOVE BIOGEL PI IND STRL 7.5 (GLOVE) ×1 IMPLANT
GLOVE BIOGEL PI INDICATOR 7.5 (GLOVE) ×2
GLOVE EXAM NITRILE LRG STRL (GLOVE) ×3 IMPLANT
GLOVE SURG SS PI 7.0 STRL IVOR (GLOVE) ×3 IMPLANT
GLOVE SURG SS PI 7.5 STRL IVOR (GLOVE) ×3 IMPLANT
GOWN STRL REUS W/ TWL LRG LVL3 (GOWN DISPOSABLE) ×2 IMPLANT
GOWN STRL REUS W/TWL LRG LVL3 (GOWN DISPOSABLE) ×4
NDL SAFETY ECLIPSE 18X1.5 (NEEDLE) IMPLANT
NEEDLE HYPO 18GX1.5 SHARP (NEEDLE)
NEEDLE HYPO 25X1 1.5 SAFETY (NEEDLE) ×3 IMPLANT
NS IRRIG 1000ML POUR BTL (IV SOLUTION) IMPLANT
PACK BASIN DAY SURGERY FS (CUSTOM PROCEDURE TRAY) ×3 IMPLANT
PENCIL BUTTON HOLSTER BLD 10FT (ELECTRODE) ×3 IMPLANT
SLEEVE SCD COMPRESS KNEE MED (MISCELLANEOUS) ×3 IMPLANT
SPONGE GAUZE 2X2 8PLY STER LF (GAUZE/BANDAGES/DRESSINGS) ×1
SPONGE GAUZE 2X2 8PLY STRL LF (GAUZE/BANDAGES/DRESSINGS) ×2 IMPLANT
SPONGE GAUZE 4X4 12PLY STER LF (GAUZE/BANDAGES/DRESSINGS) IMPLANT
STRIP CLOSURE SKIN 1/2X4 (GAUZE/BANDAGES/DRESSINGS) ×2 IMPLANT
SUT MNCRL AB 4-0 PS2 18 (SUTURE) ×3 IMPLANT
SUT PROLENE 0 CT 2 (SUTURE) ×3 IMPLANT
SUT VIC AB 2-0 CT1 27 (SUTURE)
SUT VIC AB 2-0 CT1 TAPERPNT 27 (SUTURE) IMPLANT
SUT VIC AB 3-0 SH 27 (SUTURE) ×2
SUT VIC AB 3-0 SH 27X BRD (SUTURE) ×1 IMPLANT
SUT VIC AB 4-0 BRD 54 (SUTURE) IMPLANT
SUT VIC AB 4-0 SH 18 (SUTURE) IMPLANT
SUT VICRYL 4-0 PS2 18IN ABS (SUTURE) IMPLANT
SYR BULB 3OZ (MISCELLANEOUS) IMPLANT
SYR CONTROL 10ML LL (SYRINGE) ×3 IMPLANT
TOWEL OR 17X24 6PK STRL BLUE (TOWEL DISPOSABLE) ×3 IMPLANT
TOWEL OR NON WOVEN STRL DISP B (DISPOSABLE) ×3 IMPLANT
TUBE CONNECTING 20'X1/4 (TUBING)
TUBE CONNECTING 20X1/4 (TUBING) IMPLANT
YANKAUER SUCT BULB TIP NO VENT (SUCTIONS) IMPLANT

## 2015-04-24 NOTE — Discharge Instructions (Signed)
[facility], [place]  UMBILICAL SURGERY: POST OP INSTRUCTIONS  Always review your discharge instruction sheet given to you by the facility where your surgery was performed. IF YOU HAVE DISABILITY OR FAMILY LEAVE FORMS, YOU MUST BRING THEM TO THE OFFICE FOR PROCESSING.   DO NOT GIVE THEM TO YOUR DOCTOR.  1. A  prescription for pain medication may be given to you upon discharge.  Take your pain medication as prescribed, if needed.  If narcotic pain medicine is not needed, then you may take acetaminophen (Tylenol) or ibuprofen (Advil) as needed. 2. Take your usually prescribed medications unless otherwise directed. 3. If you need a refill on your pain medication, please contact your pharmacy.  They will contact our office to request authorization. Prescriptions will not be filled after 5 pm or on week-ends. 4. You should follow a light diet the first 24 hours after arrival home, such as soup and crackers, etc.  Be sure to include lots of fluids daily.  Resume your normal diet the day after surgery. 5. Most patients will experience some swelling and bruising around the umbilicus or in the groin and scrotum.  Ice packs and reclining will help.  Swelling and bruising can take several days to resolve.  6. It is common to experience some constipation if taking pain medication after surgery.  Increasing fluid intake and taking a stool softener (such as Colace) will usually help or prevent this problem from occurring.  A mild laxative (Milk of Magnesia or Miralax) should be taken according to package directions if there are no bowel movements after 48 hours. 7. Unless discharge instructions indicate otherwise, you may remove your bandages 24-48 hours after surgery, and you may shower at that time.  You will have steri-strips (small skin tapes) in place directly over the incision.  These strips should be left on the skin for 7-10 days. 8. ACTIVITIES:  You may resume regular (light) daily activities  beginning the next day--such as daily self-care, walking, climbing stairs--gradually increasing activities as tolerated.  You may have sexual intercourse when it is comfortable.  Refrain from any heavy lifting or straining until approved by your doctor. a. You may drive when you are no longer taking prescription pain medication, you can comfortably wear a seatbelt, and you can safely maneuver your car and apply brakes. b. RETURN TO WORK:  2-3 weeks with light duty - no lifting over 15 lbs. 9. You should see your doctor in the office for a follow-up appointment approximately 2-3 weeks after your surgery.  Make sure that you call for this appointment within a day or two after you arrive home to insure a convenient appointment time. 10. OTHER INSTRUCTIONS:  __________________________________________________________________________________________________________________________________________________________________________________________  WHEN TO CALL YOUR DOCTOR: 1. Fever over 101.0 2. Inability to urinate 3. Nausea and/or vomiting 4. Extreme swelling or bruising 5. Continued bleeding from incision. 6. Increased pain, redness, or drainage from the incision  The clinic staff is available to answer your questions during regular business hours.  Please dont hesitate to call and ask to speak to one of the nurses for clinical concerns.  If you have a medical emergency, go to the nearest emergency room or call 911.  A surgeon from [facility] is always on call at the hospital   [address], [place], [place], [place]  [address] ?  [address], [place], [place]   [address] [phone]    FAX [phone] Web site: [email or web address]  Post Anesthesia Home Care Instructions  Activity: Get plenty of rest for the remainder of the day. A responsible adult should stay with you for 24 hours following the procedure.  For the next 24 hours, DO  NOT: -Drive a car -Paediatric nurse -Drink alcoholic beverages -Take any medication unless instructed by your physician -Make any legal decisions or sign important papers.  Meals: Start with liquid foods such as gelatin or soup. Progress to regular foods as tolerated. Avoid greasy, spicy, heavy foods. If nausea and/or vomiting occur, drink only clear liquids until the nausea and/or vomiting subsides. Call your physician if vomiting continues.  Special Instructions/Symptoms: Your throat may feel dry or sore from the anesthesia or the breathing tube placed in your throat during surgery. If this causes discomfort, gargle with warm salt water. The discomfort should disappear within 24 hours.  If you had a scopolamine patch placed behind your ear for the management of post- operative nausea and/or vomiting:  1. The medication in the patch is effective for 72 hours, after which it should be removed.  Wrap patch in a tissue and discard in the trash. Wash hands thoroughly with soap and water. 2. You may remove the patch earlier than 72 hours if you experience unpleasant side effects which may include dry mouth, dizziness or visual disturbances. 3. Avoid touching the patch. Wash your hands with soap and water after contact with the patch.

## 2015-04-24 NOTE — Anesthesia Preprocedure Evaluation (Addendum)
Anesthesia Evaluation  Patient identified by MRN, date of birth, ID band Patient awake    Reviewed: Allergy & Precautions, NPO status   History of Anesthesia Complications (+) PONV  Airway Mallampati: II  TM Distance: >3 FB Neck ROM: Full    Dental   Pulmonary former smoker,    breath sounds clear to auscultation       Cardiovascular hypertension,  Rhythm:Regular Rate:Normal     Neuro/Psych    GI/Hepatic Neg liver ROS, GERD  ,  Endo/Other    Renal/GU negative Renal ROS     Musculoskeletal   Abdominal   Peds  Hematology   Anesthesia Other Findings   Reproductive/Obstetrics                            Anesthesia Physical Anesthesia Plan  ASA: III  Anesthesia Plan: General   Post-op Pain Management:    Induction: Intravenous  Airway Management Planned: LMA  Additional Equipment:   Intra-op Plan:   Post-operative Plan:   Informed Consent: I have reviewed the patients History and Physical, chart, labs and discussed the procedure including the risks, benefits and alternatives for the proposed anesthesia with the patient or authorized representative who has indicated his/her understanding and acceptance.   Dental advisory given  Plan Discussed with: CRNA and Anesthesiologist  Anesthesia Plan Comments:         Anesthesia Quick Evaluation

## 2015-04-24 NOTE — Anesthesia Procedure Notes (Signed)
Procedure Name: LMA Insertion Date/Time: 04/24/2015 8:38 AM Performed by: Toula Moos L Pre-anesthesia Checklist: Patient identified, Emergency Drugs available, Suction available, Patient being monitored and Timeout performed Patient Re-evaluated:Patient Re-evaluated prior to inductionOxygen Delivery Method: Circle System Utilized Preoxygenation: Pre-oxygenation with 100% oxygen Intubation Type: IV induction Ventilation: Mask ventilation without difficulty LMA: LMA inserted LMA Size: 4.0 Number of attempts: 1 Airway Equipment and Method: Bite block Placement Confirmation: positive ETCO2 Tube secured with: Tape Dental Injury: Teeth and Oropharynx as per pre-operative assessment

## 2015-04-24 NOTE — Anesthesia Postprocedure Evaluation (Signed)
  Anesthesia Post-op Note  Patient: Wanda [name]  Procedure(s) Performed: Procedure(s): UMBILECTOMY (N/A)  Patient Location: PACU  Anesthesia Type:General  Level of Consciousness: awake  Airway and Oxygen Therapy: Patient Spontanous Breathing  Post-op Pain: mild  Post-op Assessment: Post-op Vital signs reviewed              Post-op Vital Signs: Reviewed  Last Vitals:  Filed Vitals:   04/24/15 1000  BP: 120/65  Pulse: 72  Temp:   Resp: 15    Complications: none

## 2015-04-24 NOTE — Op Note (Signed)
Pre-op Diagnosis:  Umbilical keloid scar with chronic changes and drainage Post-op Diagnosis:  Same Procedure:  Umbilectomy Surgeon:  Maia Petties. Indications:  This is a 49 year old female who is status post laparoscopy through her umbilicus by gynecology. She has developed a very thick keloid scar that is protruding from her umbilicus. She gets chronic drainage from this area. The keloid is too thick to identify an area of opening deep within the umbilicus. Due to the chronic changes, she is requesting excision of her umbilicus.  Description of procedure: The patient is brought to the operating room and placed in a supine position on the operating room table. After an adequate level of general anesthesia was obtained, her abdomen was prepped with ChloraPrep and draped sterile fashion. A timeout was taken to ensure the proper patient and proper procedure. We made a vertical elliptical midline incision around her umbilicus.  Dissection was carried down into the subcutaneous tissues with cautery. We dissected all the way down to the fascia. The umbilical stalk was amputated from the fascia. There is a 5 mm fascial defect which was closed with a pursestring suture of 0 Prolene. The umbilicus was sent for pathologic examination. We inspected for hemostasis. We closed the subcutaneous tissues with a running 3-0 Vicryl suture. The center of the suture line is pulled down to the fascia creating a small dimple. The skin was closed with a subcuticular 4-0 Monocryl. Steri-Strips and clean dressings were applied. The patient was then extubated and brought to the recovery room in stable condition. All sponge, initially, and needle counts are correct.  [name]. Georgette Dover, MD, [facility]  General/ Trauma Surgery  04/24/2015 9:23 AM

## 2015-04-24 NOTE — Transfer of Care (Signed)
Immediate Anesthesia Transfer of Care Note  Patient: Wanda [name]  Procedure(s) Performed: Procedure(s): UMBILECTOMY (N/A)  Patient Location: PACU  Anesthesia Type:General  Level of Consciousness: sedated  Airway & Oxygen Therapy: Patient Spontanous Breathing and Patient connected to face mask oxygen  Post-op Assessment: Report given to RN and Post -op Vital signs reviewed and stable  Post vital signs: Reviewed and stable  Last Vitals:  Filed Vitals:   04/24/15 0724  BP: 123/53  Pulse: 80  Temp: 36.8 C  Resp: 18    Complications: No apparent anesthesia complications

## 2015-04-24 NOTE — H&P (Signed)
  History of Present Illness  Patient words: scar revision.   This is a 49 year old female who is status post laparoscopic cholecystectomy by Dr. Ninfa Linden in 2011. Prior to that time, the patient had had diagnostic laparoscopy by a GYN through her umbilicus. She developed a large thick keloid in this area. It is very difficult to keep her umbilicus clean. The incision above the umbilicus from Dr. Trevor Mace surgery healed nicely. She has had some drainage from this area intermittently, but it dried up after using hydrogen peroxide and a cotton wick. 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 She would like to have this umbilicus removed so that it will stop draining. Allergies (Ammie Eversole, LPN; 01/23/5283 13:24 AM) Percocet *ANALGESICS - OPIOID* Lisinopril *ANTIHYPERTENSIVES* Latex Exam Gloves *MEDICAL DEVICES AND SUPPLIES*  Medication History  Doxycycline Hyclate (100MG  Tablet, 1 (one) Tablet Oral two times daily, Taken starting 12/19/2014) Active. Hydrochlorothiazide (12.5MG  Capsule, Oral) Active. Atenolol (50MG  Tablet, Oral) Active. Fluticasone Propionate (50MCG/ACT Suspension, Nasal) Active. Ferrous Sulfate (324 (65 Fe)MG Tablet DR, Oral) Active. Pantoprazole Sodium (40MG  Tablet DR, Oral) Active. Vitamin D (Cholecalciferol) (1000UNIT Tablet, Oral) Active. MetroNIDAZOLE (0.75% Cream, External) Active. Zonisamide (100MG  Capsule, Oral) Active. Cyproheptadine HCl (4MG  Tablet, Oral) Active. Promethazine HCl (25MG  Tablet, Oral) Active. TiZANidine HCl (4MG  Capsule, Oral) Active. Sertraline HCl (100MG  Tablet, Oral) Active. BusPIRone HCl (15MG  Tablet, Oral) Active. Doxycycline Hyclate (50MG  Capsule, Oral) Active. Hydrocodone-Acetaminophen (5-325MG  Tablet, Oral) Active. Robaxin (500MG  Tablet, Oral) Active. Aspirin (81MG  Tablet Chewable, Oral) Active. Medications Reconciled    Vitals  Weight: 243 lb Height: 66in Body  Surface Area: 2.27 m Body Mass Index: 39.22 kg/m Pulse: 72 (Regular)  BP: 118/78 (Sitting, Left Arm, Standard)     Physical Exam   The physical exam findings are as follows: Note:WDWN in NAD  HEENT: EOMI, sclera anicteric Neck: No masses, no thyromegaly Lungs: CTA bilaterally; normal respiratory effort CV: Regular rate and rhythm; no murmurs Abd - soft, non-tender The scar above the umbilicus is flat and well-healed. Within the umbilicus, there is very thick keloid scarring and it is impossible to see the base of the umbilicus. Ext: Well-perfused; no edema Skin: Warm, dry; no sign of jaundice    Assessment & Plan   UMBILICAL DISCHARGE (401.0  R19.8)  Current Plans Schedule for Surgery - Umbilectomy. The surgical procedure has been discussed with the patient. Potential risks, benefits, alternative treatments, and expected outcomes have been explained. All of the patient's questions at this time have been answered. The likelihood of reaching the patient's treatment goal is good. The patient understand the proposed surgical procedure and wishes to proceed.  [name]. Georgette Dover, MD, [facility]  General/ Trauma Surgery  04/24/2015 8:09 AM

## 2015-06-02 ENCOUNTER — Emergency Department (HOSPITAL_COMMUNITY): Payer: BLUE CROSS/BLUE SHIELD | Admitting: Anesthesiology

## 2015-06-02 ENCOUNTER — Emergency Department (HOSPITAL_COMMUNITY)
Admission: EM | Admit: 2015-06-02 | Discharge: 2015-06-02 | Disposition: A | Discharge status: Home / Self Care | Payer: BLUE CROSS/BLUE SHIELD | Attending: Emergency Medicine | Admitting: Emergency Medicine

## 2015-06-02 ENCOUNTER — Emergency Department (HOSPITAL_COMMUNITY): Payer: BLUE CROSS/BLUE SHIELD

## 2015-06-02 ENCOUNTER — Encounter (HOSPITAL_COMMUNITY)
Admission: EM | Disposition: A | Discharge status: Home / Self Care | Payer: Self-pay | Source: Home / Self Care | Attending: Emergency Medicine

## 2015-06-02 ENCOUNTER — Encounter (HOSPITAL_COMMUNITY): Payer: Self-pay | Admitting: Emergency Medicine

## 2015-06-02 DIAGNOSIS — M1990 Unspecified osteoarthritis, unspecified site: Secondary | ICD-10-CM | POA: Diagnosis not present

## 2015-06-02 DIAGNOSIS — Z91040 Latex allergy status: Secondary | ICD-10-CM | POA: Diagnosis not present

## 2015-06-02 DIAGNOSIS — G43109 Migraine with aura, not intractable, without status migrainosus: Secondary | ICD-10-CM

## 2015-06-02 DIAGNOSIS — R51 Headache: Secondary | ICD-10-CM | POA: Diagnosis present

## 2015-06-02 DIAGNOSIS — Z7982 Long term (current) use of aspirin: Secondary | ICD-10-CM | POA: Insufficient documentation

## 2015-06-02 DIAGNOSIS — R519 Headache, unspecified: Secondary | ICD-10-CM

## 2015-06-02 DIAGNOSIS — R531 Weakness: Secondary | ICD-10-CM

## 2015-06-02 DIAGNOSIS — I10 Essential (primary) hypertension: Secondary | ICD-10-CM | POA: Diagnosis not present

## 2015-06-02 DIAGNOSIS — Z8673 Personal history of transient ischemic attack (TIA), and cerebral infarction without residual deficits: Secondary | ICD-10-CM | POA: Insufficient documentation

## 2015-06-02 DIAGNOSIS — K219 Gastro-esophageal reflux disease without esophagitis: Secondary | ICD-10-CM | POA: Diagnosis not present

## 2015-06-02 DIAGNOSIS — Z872 Personal history of diseases of the skin and subcutaneous tissue: Secondary | ICD-10-CM | POA: Insufficient documentation

## 2015-06-02 DIAGNOSIS — E7800 Pure hypercholesterolemia, unspecified: Secondary | ICD-10-CM | POA: Insufficient documentation

## 2015-06-02 DIAGNOSIS — F329 Major depressive disorder, single episode, unspecified: Secondary | ICD-10-CM | POA: Insufficient documentation

## 2015-06-02 DIAGNOSIS — R079 Chest pain, unspecified: Secondary | ICD-10-CM | POA: Diagnosis not present

## 2015-06-02 DIAGNOSIS — Z7951 Long term (current) use of inhaled steroids: Secondary | ICD-10-CM | POA: Insufficient documentation

## 2015-06-02 DIAGNOSIS — D649 Anemia, unspecified: Secondary | ICD-10-CM | POA: Insufficient documentation

## 2015-06-02 DIAGNOSIS — Z79899 Other long term (current) drug therapy: Secondary | ICD-10-CM | POA: Insufficient documentation

## 2015-06-02 DIAGNOSIS — F419 Anxiety disorder, unspecified: Secondary | ICD-10-CM | POA: Insufficient documentation

## 2015-06-02 DIAGNOSIS — G43909 Migraine, unspecified, not intractable, without status migrainosus: Secondary | ICD-10-CM | POA: Insufficient documentation

## 2015-06-02 LAB — RAPID URINE DRUG SCREEN, HOSP PERFORMED
Amphetamines: NOT DETECTED
BARBITURATES: NOT DETECTED
Benzodiazepines: NOT DETECTED
Cocaine: NOT DETECTED
Opiates: NOT DETECTED
TETRAHYDROCANNABINOL: NOT DETECTED

## 2015-06-02 LAB — COMPREHENSIVE METABOLIC PANEL
ALT: 25 U/L (ref 14–54)
ANION GAP: 9 (ref 5–15)
AST: 29 U/L (ref 15–41)
Albumin: 4.1 g/dL (ref 3.5–5.0)
Alkaline Phosphatase: 154 U/L — ABNORMAL HIGH (ref 38–126)
BUN: 12 mg/dL (ref 6–20)
CHLORIDE: 105 mmol/L (ref 101–111)
CO2: 27 mmol/L (ref 22–32)
CREATININE: 0.81 mg/dL (ref 0.44–1.00)
Calcium: 9.6 mg/dL (ref 8.9–10.3)
Glucose, Bld: 115 mg/dL — ABNORMAL HIGH (ref 65–99)
Potassium: 3.2 mmol/L — ABNORMAL LOW (ref 3.5–5.1)
SODIUM: 141 mmol/L (ref 135–145)
Total Bilirubin: 0.3 mg/dL (ref 0.3–1.2)
Total Protein: 7.3 g/dL (ref 6.5–8.1)

## 2015-06-02 LAB — CBC
HCT: 41.2 % (ref 36.0–46.0)
HEMOGLOBIN: 13.6 g/dL (ref 12.0–15.0)
MCH: 28.5 pg (ref 26.0–34.0)
MCHC: 33 g/dL (ref 30.0–36.0)
MCV: 86.4 fL (ref 78.0–100.0)
PLATELETS: 185 10*3/uL (ref 150–400)
RBC: 4.77 MIL/uL (ref 3.87–5.11)
RDW: 14.7 % (ref 11.5–15.5)
WBC: 8.3 10*3/uL (ref 4.0–10.5)

## 2015-06-02 LAB — URINALYSIS, ROUTINE W REFLEX MICROSCOPIC
BILIRUBIN URINE: NEGATIVE
Glucose, UA: NEGATIVE mg/dL
Hgb urine dipstick: NEGATIVE
KETONES UR: NEGATIVE mg/dL
Leukocytes, UA: NEGATIVE
Nitrite: NEGATIVE
Protein, ur: NEGATIVE mg/dL
Specific Gravity, Urine: 1.01 (ref 1.005–1.030)
UROBILINOGEN UA: 0.2 mg/dL (ref 0.0–1.0)
pH: 6.5 (ref 5.0–8.0)

## 2015-06-02 LAB — I-STAT CHEM 8, ED
BUN: 13 mg/dL (ref 6–20)
CALCIUM ION: 1.19 mmol/L (ref 1.12–1.23)
CREATININE: 0.8 mg/dL (ref 0.44–1.00)
Chloride: 103 mmol/L (ref 101–111)
Glucose, Bld: 112 mg/dL — ABNORMAL HIGH (ref 65–99)
HEMATOCRIT: 43 % (ref 36.0–46.0)
HEMOGLOBIN: 14.6 g/dL (ref 12.0–15.0)
Potassium: 3.3 mmol/L — ABNORMAL LOW (ref 3.5–5.1)
SODIUM: 142 mmol/L (ref 135–145)
TCO2: 25 mmol/L (ref 0–100)

## 2015-06-02 LAB — DIFFERENTIAL
BASOS PCT: 0 %
Basophils Absolute: 0 10*3/uL (ref 0.0–0.1)
EOS ABS: 0.2 10*3/uL (ref 0.0–0.7)
EOS PCT: 2 %
Lymphocytes Relative: 24 %
Lymphs Abs: 2 10*3/uL (ref 0.7–4.0)
MONO ABS: 0.3 10*3/uL (ref 0.1–1.0)
MONOS PCT: 4 %
NEUTROS ABS: 5.8 10*3/uL (ref 1.7–7.7)
Neutrophils Relative %: 70 %

## 2015-06-02 LAB — I-STAT TROPONIN, ED: TROPONIN I, POC: 0 ng/mL (ref 0.00–0.08)

## 2015-06-02 LAB — APTT: aPTT: 27 seconds (ref 24–37)

## 2015-06-02 LAB — ETHANOL: Alcohol, Ethyl (B): <5 mg/dL (ref <5)

## 2015-06-02 LAB — PROTIME-INR
INR: 1.14 (ref 0.00–1.49)
PROTHROMBIN TIME: 14.8 s (ref 11.6–15.2)

## 2015-06-02 LAB — CBG MONITORING, ED: GLUCOSE-CAPILLARY: 119 mg/dL — AB (ref 65–99)

## 2015-06-02 SURGERY — RADIOLOGY WITH ANESTHESIA
Anesthesia: Choice

## 2015-06-02 MED ORDER — CEFAZOLIN SODIUM-DEXTROSE 2-3 GM-% IV SOLR
INTRAVENOUS | Status: AC
  Filled 2015-06-02: qty 50

## 2015-06-02 MED ORDER — PROCHLORPERAZINE EDISYLATE 5 MG/ML IJ SOLN
10.0000 mg | Freq: Once | INTRAMUSCULAR | Status: AC
  Administered 2015-06-02: 10 mg via INTRAVENOUS
  Filled 2015-06-02: qty 2

## 2015-06-02 MED ORDER — SODIUM CHLORIDE 0.9 % IV BOLUS (SEPSIS)
1000.0000 mL | Freq: Once | INTRAVENOUS | Status: AC
  Administered 2015-06-02: 1000 mL via INTRAVENOUS

## 2015-06-02 MED ORDER — KETOROLAC TROMETHAMINE 30 MG/ML IJ SOLN
30.0000 mg | Freq: Once | INTRAMUSCULAR | Status: AC
  Administered 2015-06-02: 30 mg via INTRAVENOUS
  Filled 2015-06-02: qty 1

## 2015-06-02 MED ORDER — DIPHENHYDRAMINE HCL 50 MG/ML IJ SOLN
25.0000 mg | Freq: Once | INTRAMUSCULAR | Status: AC
  Administered 2015-06-02: 25 mg via INTRAVENOUS
  Filled 2015-06-02: qty 1

## 2015-06-02 NOTE — ED Notes (Signed)
Per dr. Leonel Ramsay and Langley Gauss, Code stroke CANCELED

## 2015-06-02 NOTE — Discharge Instructions (Signed)

## 2015-06-02 NOTE — Consult Note (Addendum)
Neurology Consultation Reason for Consult: Left sided weakness Referring Physician: Sabra Heck, B  CC: left sided weakness  History is obtained from:patient  HPI: Wanda [name] is a 49 y.o. female with migraines, recurrent MRI negative presentations with left sided weakness who was in church today when she developed left sided weakness. She states that she was hot, and then felt "off." She began having chest heaviness and headache. The headache is throbbing and associated with photophobia.   She has had multiple presentations that are similar in nature and have been called TIA in the past, and MRI has always been negative.   LKW: 12:00pm tpa given?: no, not a stroke.     ROS: A 14 point ROS was performed and is negative except as noted in the HPI.   Past Medical History  Diagnosis Date  . Depression   . Hypercholesterolemia     no current med.  . Anxiety   . History of blood transfusion   . Stroke ([place]) 10/27/2012    TIA   1- 10/29/11-weakness left side  . PONV (postoperative nausea and vomiting)     states severe  . Migraines   . History of TIA (transient ischemic attack)     x 2; left-sided weakness  . GERD (gastroesophageal reflux disease)   . Hypertension     states under control with meds., has been on med. since 06/2011  . Seasonal allergies   . Chronic bronchitis ([place])   . Family history of adverse reaction to anesthesia     pt's mother has hx. of post-op N/V  . Acne   . Osteoarthritis   . Keloid 03/239    umbilical  . Anemia     takes iron supplement     Family History  Problem Relation Age of Onset  . Cervical cancer Maternal Grandmother   . Cancer Maternal Grandmother     mouth  . Heart attack Maternal Grandmother 55  . Hypertension Brother   . Heart failure Brother     ICD  . Anesthesia problems Mother     post-op N/V     Social History:  reports that she quit smoking about 10 years ago. She has never used smokeless tobacco. She reports that  she does not drink alcohol or use illicit drugs.   Exam: Current vital signs: BP 109/60 mmHg  Pulse 80  Temp(Src) 98 F (36.7 C) (Oral)  Resp 17  SpO2 98% Vital signs in last 24 hours: Temp:  [98 F (36.7 C)] 98 F (36.7 C) (11/06 1315) Pulse Rate:  [80-82] 80 (11/06 1329) Resp:  [16-17] 17 (11/06 1329) BP: (109-131)/(60-63) 109/60 mmHg (11/06 1329) SpO2:  [98 %-100 %] 98 % (11/06 1329)   Physical Exam  Constitutional: Appears well-developed and well-nourished.  Psych: Does not appear as concerned as would be expected given degree of deficits.  Eyes: No scleral injection HENT: No OP obstrucion Head: Normocephalic.  Cardiovascular: Normal rate and regular rhythm.  Respiratory: Effort normal  GI: Soft.  No distension. There is no tenderness.  Skin: WDI  Neuro: Mental Status: Patient is awake, alert, oriented to person, place, month, year, and situation. Patient is able to give a clear and coherent history. No signs of aphasia. Appears to attend to the left, but difficult to test for mild neglect given absent sensation on the left and left hemianopia.  Cranial Nerves: II: initially appeared to have a Left hemianopia, however with left eye covered, has full fields in right eye. Pupils  are equal, round, and reactive to light.   III,IV, VI: Does not fully look to the left.   V: Facial sensation is decreased on the left. Splits midline to vibration.  VII: Facial movement is decreased, when puffing cheeks, she holds her left side taught while allowing the right to expand.  VIII: hearing is intact to voice X: Uvula elevates symmetrically XI: Shoulder shrug is symmetric. XII: tongue is midline without atrophy or fasciculations.  Motor: Tone is normal. Bulk is normal. 5/5 strength was present on the right, She has marked give-way weakness on the left but is able to reliably avoid her face.  Sensory: Sensation is completely absent throughout the left side.  Cerebellar: FNF and  HKS are intact on the right.     I have reviewed labs in epic and the results pertinent to this consultation are: Chem 8 - unremarkable  I have reviewed the images obtained: CT head - negative.   Impression: 49 yo F with multiple findings consistent with nonphysiological findings. With photophobic headache, complex migraine with embellishment is difrficult to rule out, however I have significant concern that this is just conversion disorder. Given the possibility of complex migraine, however, I would favor treating for this to see if she improves.   This is NOT consistent with TIA or stroke.   Recommendations: 1) MRI brain, if negative, treat as migraine.  2) No further neurological workup needed at this time.    Roland Rack, MD [facility] [phone]  If 7pm- 7am, please page neurology on call as listed in [place].

## 2015-06-02 NOTE — ED Notes (Signed)
Pt transfererd to MRI with this nurse

## 2015-06-02 NOTE — ED Notes (Signed)
Pt returned from CT with this nurse, no adverse events

## 2015-06-02 NOTE — ED Notes (Signed)
Pt now able to stand up on the side of the bed and move both arms

## 2015-06-02 NOTE — Anesthesia Preprocedure Evaluation (Addendum)
Anesthesia Evaluation    Reviewed: Unable to perform ROS - Chart review only  History of Anesthesia Complications (+) PONV and history of anesthetic complications  Airway        Dental   Pulmonary former smoker,           Cardiovascular hypertension,   Study Conclusions  - Left ventricle: The cavity size was normal. Wall thickness was increased in a pattern of mild LVH. Systolic function was normal. The estimated ejection fraction was in the range of 60% to 65%. Wall motion was normal; there were no regional wall motion abnormalities. - Atrial septum: No defect or patent foramen ovale was identified.    Neuro/Psych PSYCHIATRIC DISORDERS Anxiety Depression CVA    GI/Hepatic Neg liver ROS, GERD  ,  Endo/Other  Morbid obesity  Renal/GU negative Renal ROS     Musculoskeletal   Abdominal   Peds  Hematology   Anesthesia Other Findings   Reproductive/Obstetrics                            Anesthesia Physical Anesthesia Plan  ASA: III and emergent  Anesthesia Plan: General   Post-op Pain Management:    Induction: Intravenous  Airway Management Planned: Oral ETT  Additional Equipment: Arterial line  Intra-op Plan:   Post-operative Plan: Possible Post-op intubation/ventilation  Informed Consent:   Plan Discussed with:   Anesthesia Plan Comments:         Anesthesia Quick Evaluation

## 2015-06-02 NOTE — ED Notes (Signed)
Dr Miller at bedside. 

## 2015-06-02 NOTE — ED Provider Notes (Signed)
CSN: [id number]     Arrival date & time 06/02/15  1256 History   First MD Initiated Contact with Patient 06/02/15 1312     Chief Complaint  Patient presents with  . Chest Pain  . Stroke Symptoms     (Consider location/radiation/quality/duration/timing/severity/associated sxs/prior Treatment) HPI  49 year old female, she has a history of migraines, also has a history of transient ischemic attack with left-sided weakness in the past. She reports a history of stroke in 2014 according to the medical record. She presents from church where she started to develop chest pressure and then all of a sudden started having left-sided weakness in her arm, her leg and left-sided numbness in her face. She was slurring her speech, she was unable to ambulate, paramedics were called because of the significant disability. At this time the patient states she has ongoing symptoms, nothing makes this better or worse, she was given nitroglycerin and the truck which seemed to make her chest pain better.  Onset 45 minutes prior to arrival   Past Medical History  Diagnosis Date  . Depression   . Hypercholesterolemia     no current med.  . Anxiety   . History of blood transfusion   . Stroke ([place]) 10/27/2012    TIA   1- 10/29/11-weakness left side  . PONV (postoperative nausea and vomiting)     states severe  . Migraines   . History of TIA (transient ischemic attack)     x 2; left-sided weakness  . GERD (gastroesophageal reflux disease)   . Hypertension     states under control with meds., has been on med. since 06/2011  . Seasonal allergies   . Chronic bronchitis ([place])   . Family history of adverse reaction to anesthesia     pt's mother has hx. of post-op N/V  . Acne   . Osteoarthritis   . Keloid 08/9516    umbilical  . Anemia     takes iron supplement   Past Surgical History  Procedure Laterality Date  . Vaginal hysterectomy      for DUB in 10/2003; partial  . Knee arthroscopy Left 02/2000  . Total  knee arthroplasty Left 12/31/2014    Procedure: TOTAL LEFT KNEE ARTHROPLASTY;  Surgeon: Frederik Pear, MD;  Location: [facility];  Service: Orthopedics;  Laterality: Left;  . Diagnostic laparoscopy  early 2005  . Cholecystectomy  08/27/2009  . Laminotomy Left 03/27/2010    L5-S1; microdissection  . Lumbar fusion Left 07/18/2010    L4-5; L5-S1  . Carpal tunnel release Left 12/2013  . Carpal tunnel release Right 03/29/2015  . Functional endoscopic sinus surgery  04/30/2006  . Ethmoidectomy Bilateral 04/30/2006  . Nasal turbinate reduction  04/30/2006    inferior  . Total abdominal hysterectomy  11/01/2003    with lysis of adhesions; ovaries retained  . Laminotomy Bilateral 07/12/2007    L4-5  . Hysteroscopy w/d&c  04/21/2002    with lysis of adhesions  . Lumbar fusion  04/10/2014    L3-4, L4-5, L5-S1   Family History  Problem Relation Age of Onset  . Cervical cancer Maternal Grandmother   . Cancer Maternal Grandmother     mouth  . Heart attack Maternal Grandmother 55  . Hypertension Brother   . Heart failure Brother     ICD  . Anesthesia problems Mother     post-op N/V   Social History  Substance Use Topics  . Smoking status: Former Smoker -- 0.00 packs/day    Quit  date: 07/20/2004  . Smokeless tobacco: Never Used  . Alcohol Use: No   OB History    No data available     Review of Systems  All other systems reviewed and are negative.     Allergies  Lisinopril; Topamax; Latex; and Oxycodone-acetaminophen  Home Medications   Prior to Admission medications   Medication Sig Start Date End Date Taking? Authorizing Provider  albuterol (PROVENTIL HFA;VENTOLIN HFA) 108 (90 BASE) MCG/ACT inhaler Inhale into the lungs every 6 (six) hours as needed for wheezing or shortness of breath.    Historical Provider, MD  albuterol (PROVENTIL) (5 MG/ML) 0.5% nebulizer solution Take 2.5 mg by nebulization every 6 (six) hours as needed for wheezing or shortness of breath.    Historical Provider, MD   aspirin 81 MG tablet Take 81 mg by mouth daily.    Historical Provider, MD  atenolol (TENORMIN) 50 MG tablet Take 50 mg by mouth daily.    Historical Provider, MD  busPIRone (BUSPAR) 15 MG tablet Take 15 mg by mouth 2 (two) times daily.     Historical Provider, MD  cyproheptadine (PERIACTIN) 4 MG tablet Take 8 mg by mouth 2 (two) times daily.    Historical Provider, MD  ferrous sulfate 325 (65 FE) MG tablet Take 325 mg by mouth 2 (two) times daily with a meal.    Historical Provider, MD  fluticasone (FLONASE) 50 MCG/ACT nasal spray Place 2 sprays into both nostrils daily.  12/19/13   Historical Provider, MD  hydrochlorothiazide (MICROZIDE) 12.5 MG capsule Take 12.5 mg by mouth daily.    Historical Provider, MD  HYDROcodone-acetaminophen (NORCO/VICODIN) 5-325 MG per tablet Take 1-2 tablets by mouth every 6 (six) hours as needed for moderate pain. 12/31/14   Leighton Parody, PA-C  HYDROcodone-acetaminophen (NORCO/VICODIN) 5-325 MG tablet Take 1 tablet by mouth every 4 (four) hours as needed. 04/24/15   Donnie Mesa, MD  methocarbamol (ROBAXIN) 500 MG tablet Take 500 mg by mouth 4 (four) times daily.    Historical Provider, MD  neomycin-bacitracin-polymyxin (NEOSPORIN) [phone] ointment Apply 1 application topically daily as needed (rash under chin).    Historical Provider, MD  ondansetron (ZOFRAN) 8 MG tablet Take 8 mg by mouth every 8 (eight) hours as needed for nausea or vomiting.    Historical Provider, MD  pantoprazole (PROTONIX) 40 MG tablet Take 40 mg by mouth daily.    Historical Provider, MD  sertraline (ZOLOFT) 100 MG tablet Take 100-200 mg by mouth 2 (two) times daily. Take 100 mg in the am and 200 mg in the pm    Historical Provider, MD  tizanidine (ZANAFLEX) 2 MG capsule Take 4 mg by mouth 2 (two) times daily.     Historical Provider, MD  Vitamin D, Ergocalciferol, (DRISDOL) 50000 UNITS CAPS Take 50,000 Units by mouth every 7 (seven) days. mondays    Historical Provider, MD  zonisamide  (ZONEGRAN) 100 MG capsule Take 200 mg by mouth 2 (two) times daily.    Historical Provider, MD   BP 114/59 mmHg  Pulse 76  Temp(Src) 98 F (36.7 C) (Oral)  Resp 11  SpO2 99% Physical Exam  Constitutional: She appears well-developed and well-nourished. No distress.  HENT:  Head: Normocephalic and atraumatic.  Mouth/Throat: Oropharynx is clear and moist. No oropharyngeal exudate.  Eyes: Conjunctivae and EOM are normal. Pupils are equal, round, and reactive to light. Right eye exhibits no discharge. Left eye exhibits no discharge. No scleral icterus.  Neck: Normal range of motion. Neck supple.  No JVD present. No thyromegaly present.  Cardiovascular: Normal rate, regular rhythm, normal heart sounds and intact distal pulses.  Exam reveals no gallop and no friction rub.   No murmur heard. Pulmonary/Chest: Effort normal and breath sounds normal. No respiratory distress. She has no wheezes. She has no rales.  Abdominal: Soft. Bowel sounds are normal. She exhibits no distension and no mass. There is no tenderness.  Musculoskeletal: Normal range of motion. She exhibits no edema or tenderness.  Lymphadenopathy:    She has no cervical adenopathy.  Neurological: She is alert.  Cranial nerves III through XII are normal except for sensation of the left side of the face. There is no facial droop. She has left upper and left lower extremity numbness to light touch however pinprick is felt in a normal pattern. She has normal strength of the right upper and right lower extremity, she has weakness to the left upper left lower extremity. She does let her arm drop to the bed when it is held up, she will not let the arm hit her face when it is dropped. When she sits up with assistance to take off her shirt, she is able to assist in lifting her left arm to the parallel position without any difficulty.  Skin: Skin is warm and dry. No rash noted. No erythema.  Psychiatric: She has a normal mood and affect. Her  behavior is normal.  Nursing note and vitals reviewed.   ED Course  Procedures (including critical care time) Labs Review Labs Reviewed  COMPREHENSIVE METABOLIC PANEL - Abnormal; Notable for the following:    Potassium 3.2 (*)    Glucose, Bld 115 (*)    Alkaline Phosphatase 154 (*)    All other components within normal limits  I-STAT CHEM 8, ED - Abnormal; Notable for the following:    Potassium 3.3 (*)    Glucose, Bld 112 (*)    All other components within normal limits  ETHANOL  PROTIME-INR  APTT  CBC  DIFFERENTIAL  URINE RAPID DRUG SCREEN, HOSP PERFORMED  URINALYSIS, ROUTINE W REFLEX MICROSCOPIC (NOT AT [facility])  Randolm Idol, ED    Imaging Review Ct Head Wo Contrast  06/02/2015  ADDENDUM REPORT: 06/02/2015 13:34 ADDENDUM: Study discussed by telephone with Dr. Roland Rack on 06/02/2015 at 1333 hours. Electronically Signed   By: Genevie Ann M.D.   On: 06/02/2015 13:34  06/02/2015  CLINICAL DATA:  49 year old female code stroke. Left side weakness and abnormal speech. Last seen normal at noon. Initial encounter. EXAM: CT HEAD WITHOUT CONTRAST TECHNIQUE: Contiguous axial images were obtained from the base of the skull through the vertex without intravenous contrast. COMPARISON:  Brain MRI 10/28/2012 and earlier. FINDINGS: Mild paranasal sinus mucosal thickening. Mastoids and tympanic cavities are clear. No acute osseous abnormality identified. No acute orbit or scalp soft tissue finding. Mild Calcified atherosclerosis at the skull base. Normal cerebral volume. No midline shift, ventriculomegaly, mass effect, evidence of mass lesion, intracranial hemorrhage or evidence of cortically based acute infarction. Gray-white matter differentiation is within normal limits throughout the brain. No suspicious intracranial vascular hyperdensity, symmetric appearing M1 density. IMPRESSION: Stable and normal noncontrast CT appearance of the brain. Electronically Signed: By: Genevie Ann M.D. On:  06/02/2015 13:30   Mr Brain Wo Contrast  06/02/2015  CLINICAL DATA:  49 year old female with left side weakness, slurred speech. Code stroke. Initial encounter. EXAM: MRI HEAD WITHOUT CONTRAST TECHNIQUE: Multiplanar, multiecho pulse sequences of the brain and surrounding structures were obtained without intravenous contrast.  COMPARISON:  Head CT 1321 hours today.  Brain MRI 10/28/2012. FINDINGS: Study is intermittently degraded by motion artifact despite repeated imaging attempts. Major intracranial vascular flow voids are stable and appear within normal limits. No restricted diffusion to suggest acute infarction. No midline shift, mass effect, evidence of mass lesion, ventriculomegaly, extra-axial collection or acute intracranial hemorrhage. Cervicomedullary junction and pituitary are within normal limits. Pearline Cables and white matter signal appears stable and within normal limits throughout the brain. Grossly negative visualized cervical spine allowing for motion today. Paranasal sinuses and mastoids are clear. Negative scalp soft tissues. IMPRESSION: No acute intracranial abnormality. Stable and negative noncontrast MRI appearance of the brain allowing for motion artifact today. This study reviewed preliminarily with Dr. Roland Rack at 1357 hours on 06/02/2015. Electronically Signed   By: Genevie Ann M.D.   On: 06/02/2015 14:51   I have personally reviewed and evaluated these images and lab results as part of my medical decision-making.   EKG Interpretation   Date/Time:  Sunday June 02 2015 13:13:41 EST Ventricular Rate:  82 PR Interval:  159 QRS Duration: 85 QT Interval:  389 QTC Calculation: 248 R Axis:   68 Text Interpretation:  Sinus rhythm Normal ECG since last tracing no  significant change Confirmed by Arbor Cohen  MD, Swati Granberry ([id number]) on 06/02/2015  1:34:02 PM      MDM   Final diagnoses:  Nonintractable headache, unspecified chronicity pattern, unspecified headache type    Code stroke  activated secondary to focal neurologic deficits. The patient has an abnormal neurologic exam but it is not consistent, care was discussed with the neurologist, will obtain MRI to rule out stroke though suspect some other source of her migrainous or conversion type illness. The patient has normal vital signs, EKG, labs pending  Code stroke activated, Dr. Leonel Ramsay has seen the patient, he believes that this is not stroke, MRI ordered and confirms that the patient is not having a stroke, she responded well to the medications I have given, please see below. I have discussed the care with the patient and her husband and her children, with her permission I have discussed her results. They are in agreement to allow her to rest for the next 24 hours while she gradually improved from what appears to be a complicated migraine. On repeat exam prior to discharge the patient is able to lift bilateral arms, she has grips that are bilateral however she is slightly weaker on the left, she is able to straight leg raise on the left though it is also slightly weaker than the right. Her speech is normal, her movements are normal, vital signs are normal, stable for discharge.  Medical screening examination/treatment/procedure(s) were conducted as a shared visit with non-physician practitioner(s) and myself.  I personally evaluated the patient during the encounter.  Clinical Impression:   Final diagnoses:  Nonintractable headache, unspecified chronicity pattern, unspecified headache type         Noemi Chapel, MD 06/02/15 [phone]

## 2015-06-02 NOTE — ED Notes (Signed)
Pt signed the pin pad with both hands, is supporting body weight with L arm

## 2015-06-02 NOTE — ED Notes (Signed)
Per [facility], pt c/o chest pressure, and Pt has left sided weakness currently, has had hx of TIA with left sided weakness. Speech is slurred, hx of migraines. Pt was at church. 12 lead unremarkable. 2 nitro with relief of pressure, pt took 324 asa this morning. AAOX4

## 2015-06-02 NOTE — ED Notes (Signed)
Pt speech is clear

## 2015-09-03 ENCOUNTER — Ambulatory Visit
Admission: RE | Admit: 2015-09-03 | Discharge: 2015-09-03 | Disposition: A | Discharge status: Home / Self Care | Payer: BLUE CROSS/BLUE SHIELD | Source: Ambulatory Visit | Attending: Family | Admitting: Family

## 2015-09-03 ENCOUNTER — Other Ambulatory Visit: Payer: Self-pay | Admitting: Family

## 2015-09-03 DIAGNOSIS — R05 Cough: Secondary | ICD-10-CM

## 2015-09-03 DIAGNOSIS — R059 Cough, unspecified: Secondary | ICD-10-CM

## 2015-09-17 ENCOUNTER — Emergency Department (HOSPITAL_COMMUNITY): Payer: BLUE CROSS/BLUE SHIELD

## 2015-09-17 ENCOUNTER — Encounter (HOSPITAL_COMMUNITY): Payer: Self-pay | Admitting: Cardiology

## 2015-09-17 ENCOUNTER — Emergency Department (HOSPITAL_COMMUNITY)
Admission: EM | Admit: 2015-09-17 | Discharge: 2015-09-18 | Disposition: A | Discharge status: Home / Self Care | Payer: BLUE CROSS/BLUE SHIELD | Attending: Emergency Medicine | Admitting: Emergency Medicine

## 2015-09-17 DIAGNOSIS — J45901 Unspecified asthma with (acute) exacerbation: Secondary | ICD-10-CM | POA: Diagnosis not present

## 2015-09-17 DIAGNOSIS — Z8639 Personal history of other endocrine, nutritional and metabolic disease: Secondary | ICD-10-CM | POA: Diagnosis not present

## 2015-09-17 DIAGNOSIS — K219 Gastro-esophageal reflux disease without esophagitis: Secondary | ICD-10-CM | POA: Diagnosis not present

## 2015-09-17 DIAGNOSIS — I10 Essential (primary) hypertension: Secondary | ICD-10-CM | POA: Insufficient documentation

## 2015-09-17 DIAGNOSIS — F419 Anxiety disorder, unspecified: Secondary | ICD-10-CM | POA: Diagnosis not present

## 2015-09-17 DIAGNOSIS — Z91040 Latex allergy status: Secondary | ICD-10-CM | POA: Diagnosis not present

## 2015-09-17 DIAGNOSIS — Z87891 Personal history of nicotine dependence: Secondary | ICD-10-CM | POA: Diagnosis not present

## 2015-09-17 DIAGNOSIS — F329 Major depressive disorder, single episode, unspecified: Secondary | ICD-10-CM | POA: Insufficient documentation

## 2015-09-17 DIAGNOSIS — Z7982 Long term (current) use of aspirin: Secondary | ICD-10-CM | POA: Insufficient documentation

## 2015-09-17 DIAGNOSIS — D649 Anemia, unspecified: Secondary | ICD-10-CM | POA: Insufficient documentation

## 2015-09-17 DIAGNOSIS — R0602 Shortness of breath: Secondary | ICD-10-CM | POA: Diagnosis present

## 2015-09-17 DIAGNOSIS — Z7951 Long term (current) use of inhaled steroids: Secondary | ICD-10-CM | POA: Diagnosis not present

## 2015-09-17 DIAGNOSIS — G43909 Migraine, unspecified, not intractable, without status migrainosus: Secondary | ICD-10-CM | POA: Diagnosis not present

## 2015-09-17 DIAGNOSIS — Z8673 Personal history of transient ischemic attack (TIA), and cerebral infarction without residual deficits: Secondary | ICD-10-CM | POA: Diagnosis not present

## 2015-09-17 DIAGNOSIS — M1990 Unspecified osteoarthritis, unspecified site: Secondary | ICD-10-CM | POA: Diagnosis not present

## 2015-09-17 DIAGNOSIS — Z872 Personal history of diseases of the skin and subcutaneous tissue: Secondary | ICD-10-CM | POA: Insufficient documentation

## 2015-09-17 DIAGNOSIS — Z79899 Other long term (current) drug therapy: Secondary | ICD-10-CM | POA: Insufficient documentation

## 2015-09-17 LAB — BASIC METABOLIC PANEL
ANION GAP: 10 (ref 5–15)
BUN: 13 mg/dL (ref 6–20)
CHLORIDE: 111 mmol/L (ref 101–111)
CO2: 24 mmol/L (ref 22–32)
Calcium: 9.6 mg/dL (ref 8.9–10.3)
Creatinine, Ser: 0.83 mg/dL (ref 0.44–1.00)
Glucose, Bld: 121 mg/dL — ABNORMAL HIGH (ref 65–99)
POTASSIUM: 4.1 mmol/L (ref 3.5–5.1)
SODIUM: 145 mmol/L (ref 135–145)

## 2015-09-17 LAB — CBC
HEMATOCRIT: 40.9 % (ref 36.0–46.0)
HEMOGLOBIN: 13 g/dL (ref 12.0–15.0)
MCH: 28.9 pg (ref 26.0–34.0)
MCHC: 31.8 g/dL (ref 30.0–36.0)
MCV: 90.9 fL (ref 78.0–100.0)
Platelets: 198 10*3/uL (ref 150–400)
RBC: 4.5 MIL/uL (ref 3.87–5.11)
RDW: 14.4 % (ref 11.5–15.5)
WBC: 6.5 10*3/uL (ref 4.0–10.5)

## 2015-09-17 LAB — I-STAT TROPONIN, ED: Troponin i, poc: 0 ng/mL (ref 0.00–0.08)

## 2015-09-17 MED ORDER — KETOROLAC TROMETHAMINE 30 MG/ML IJ SOLN
30.0000 mg | Freq: Once | INTRAMUSCULAR | Status: AC
  Administered 2015-09-17: 30 mg via INTRAVENOUS
  Filled 2015-09-17: qty 1

## 2015-09-17 MED ORDER — GUAIFENESIN-CODEINE 100-10 MG/5ML PO SOLN: 5.0000 mL | Freq: Three times a day (TID) | ORAL | Status: DC | PRN

## 2015-09-17 MED ORDER — ALBUTEROL SULFATE (2.5 MG/3ML) 0.083% IN NEBU
INHALATION_SOLUTION | RESPIRATORY_TRACT | Status: AC
  Filled 2015-09-17: qty 6

## 2015-09-17 MED ORDER — ALBUTEROL SULFATE (2.5 MG/3ML) 0.083% IN NEBU
5.0000 mg | INHALATION_SOLUTION | Freq: Once | RESPIRATORY_TRACT | Status: AC
  Administered 2015-09-17: 5 mg via RESPIRATORY_TRACT

## 2015-09-17 MED ORDER — ALBUTEROL (5 MG/ML) CONTINUOUS INHALATION SOLN
10.0000 mg/h | INHALATION_SOLUTION | Freq: Once | RESPIRATORY_TRACT | Status: AC
  Administered 2015-09-17: 10 mg/h via RESPIRATORY_TRACT
  Filled 2015-09-17: qty 40

## 2015-09-17 MED ORDER — DEXAMETHASONE SODIUM PHOSPHATE 10 MG/ML IJ SOLN
10.0000 mg | Freq: Once | INTRAMUSCULAR | Status: AC
  Administered 2015-09-17: 10 mg via INTRAVENOUS
  Filled 2015-09-17: qty 1

## 2015-09-17 MED ORDER — PREDNISONE 50 MG PO TABS: 50.0000 mg | ORAL_TABLET | Freq: Every day | ORAL | Status: DC

## 2015-09-17 NOTE — ED Notes (Signed)
Pt given treatment by other triage RN. Pt still wheezing, but sats remain stable. Acuity increased

## 2015-09-17 NOTE — ED Notes (Signed)
Pt left with all her belongings and ambulated out of the treatment area.  

## 2015-09-17 NOTE — ED Notes (Signed)
Pt tearful and wheezing.  Talking in complete sentences.  100/RA

## 2015-09-17 NOTE — ED Notes (Signed)
Pt called for reassessment of VS with no answer

## 2015-09-17 NOTE — ED Provider Notes (Signed)
CSN: [id number]     Arrival date & time 09/17/15  1157 History   First MD Initiated Contact with Patient 09/17/15 2010     Chief Complaint  Patient presents with  . Chest Pain  . Shortness of Breath     (Consider location/radiation/quality/duration/timing/severity/associated sxs/prior Treatment) HPI   Salene Cardinal is a 50 y.o F with a pmhx of HTN, HLD, migraines who presents the emergency department today complaining of chest tightness and shortness of breath. Patient states that Saturday night she began having a dry cough with associated wheezing. Pt also reports DOE. Pt states that she did several home nebulizer treatments with minimal relief. Pt was unable to see her PCP today so she came to the ED. Patient states that over the last 3 months she has had similar episodes like today which required treatment with steroids. She currently takes advair BID. Denies tobacco use. Denies fevers, chills, paresthesias, syncope, N/V, dizziness.   Past Medical History  Diagnosis Date  . Depression   . Hypercholesterolemia     no current med.  . Anxiety   . History of blood transfusion   . Stroke ([place]) 10/27/2012    TIA   1- 10/29/11-weakness left side  . PONV (postoperative nausea and vomiting)     states severe  . Migraines   . History of TIA (transient ischemic attack)     x 2; left-sided weakness  . GERD (gastroesophageal reflux disease)   . Hypertension     states under control with meds., has been on med. since 06/2011  . Seasonal allergies   . Chronic bronchitis ([place])   . Family history of adverse reaction to anesthesia     pt's mother has hx. of post-op N/V  . Acne   . Osteoarthritis   . Keloid [id number]    umbilical  . Anemia     takes iron supplement   Past Surgical History  Procedure Laterality Date  . Vaginal hysterectomy      for DUB in 10/2003; partial  . Knee arthroscopy Left 02/2000  . Total knee arthroplasty Left 12/31/2014    Procedure: TOTAL LEFT KNEE ARTHROPLASTY;   Surgeon: Frederik Pear, MD;  Location: [facility];  Service: Orthopedics;  Laterality: Left;  . Diagnostic laparoscopy  early 2005  . Cholecystectomy  08/27/2009  . Laminotomy Left 03/27/2010    L5-S1; microdissection  . Lumbar fusion Left 07/18/2010    L4-5; L5-S1  . Carpal tunnel release Left 12/2013  . Carpal tunnel release Right 03/29/2015  . Functional endoscopic sinus surgery  04/30/2006  . Ethmoidectomy Bilateral 04/30/2006  . Nasal turbinate reduction  04/30/2006    inferior  . Total abdominal hysterectomy  11/01/2003    with lysis of adhesions; ovaries retained  . Laminotomy Bilateral 07/12/2007    L4-5  . Hysteroscopy w/d&c  04/21/2002    with lysis of adhesions  . Lumbar fusion  04/10/2014    L3-4, L4-5, L5-S1   Family History  Problem Relation Age of Onset  . Cervical cancer Maternal Grandmother   . Cancer Maternal Grandmother     mouth  . Heart attack Maternal Grandmother 55  . Hypertension Brother   . Heart failure Brother     ICD  . Anesthesia problems Mother     post-op N/V   Social History  Substance Use Topics  . Smoking status: Former Smoker -- 0.00 packs/day    Quit date: 07/20/2004  . Smokeless tobacco: Never Used  . Alcohol Use: No  OB History    No data available     Review of Systems  All other systems reviewed and are negative.     Allergies  Lisinopril; Topamax; Latex; and Oxycodone-acetaminophen  Home Medications   Prior to Admission medications   Medication Sig Start Date End Date Taking? Authorizing Provider  albuterol (PROVENTIL HFA;VENTOLIN HFA) 108 (90 BASE) MCG/ACT inhaler Inhale into the lungs every 6 (six) hours as needed for wheezing or shortness of breath.    Historical Provider, MD  albuterol (PROVENTIL) (5 MG/ML) 0.5% nebulizer solution Take 2.5 mg by nebulization every 6 (six) hours as needed for wheezing or shortness of breath.    Historical Provider, MD  aspirin 81 MG tablet Take 81 mg by mouth daily.    Historical Provider, MD   atenolol (TENORMIN) 50 MG tablet Take 50 mg by mouth daily.    Historical Provider, MD  busPIRone (BUSPAR) 15 MG tablet Take 15 mg by mouth 2 (two) times daily.     Historical Provider, MD  cyproheptadine (PERIACTIN) 4 MG tablet Take 8 mg by mouth 2 (two) times daily.    Historical Provider, MD  ferrous sulfate 325 (65 FE) MG tablet Take 325 mg by mouth 2 (two) times daily with a meal.    Historical Provider, MD  fluticasone (FLONASE) 50 MCG/ACT nasal spray Place 2 sprays into both nostrils daily.  12/19/13   Historical Provider, MD  hydrochlorothiazide (MICROZIDE) 12.5 MG capsule Take 12.5 mg by mouth daily.    Historical Provider, MD  HYDROcodone-acetaminophen (NORCO/VICODIN) 5-325 MG per tablet Take 1-2 tablets by mouth every 6 (six) hours as needed for moderate pain. 12/31/14   Leighton Parody, PA-C  HYDROcodone-acetaminophen (NORCO/VICODIN) 5-325 MG tablet Take 1 tablet by mouth every 4 (four) hours as needed. 04/24/15   Donnie Mesa, MD  methocarbamol (ROBAXIN) 500 MG tablet Take 500 mg by mouth 4 (four) times daily.    Historical Provider, MD  neomycin-bacitracin-polymyxin (NEOSPORIN) [phone] ointment Apply 1 application topically daily as needed (rash under chin).    Historical Provider, MD  ondansetron (ZOFRAN) 8 MG tablet Take 8 mg by mouth every 8 (eight) hours as needed for nausea or vomiting.    Historical Provider, MD  pantoprazole (PROTONIX) 40 MG tablet Take 40 mg by mouth daily.    Historical Provider, MD  sertraline (ZOLOFT) 100 MG tablet Take 100-200 mg by mouth 2 (two) times daily. Take 100 mg in the am and 200 mg in the pm    Historical Provider, MD  tizanidine (ZANAFLEX) 2 MG capsule Take 4 mg by mouth 2 (two) times daily.     Historical Provider, MD  Vitamin D, Ergocalciferol, (DRISDOL) 50000 UNITS CAPS Take 50,000 Units by mouth every 7 (seven) days. mondays    Historical Provider, MD  zonisamide (ZONEGRAN) 100 MG capsule Take 200 mg by mouth 2 (two) times daily.     Historical Provider, MD   BP 127/68 mmHg  Pulse 97  Temp(Src) 98.3 F (36.8 C) (Oral)  Resp 19  Wt 111.585 kg  SpO2 99% Physical Exam  Constitutional: She is oriented to person, place, and time. She appears well-developed and well-nourished. No distress.  HENT:  Head: Normocephalic and atraumatic.  Mouth/Throat: No oropharyngeal exudate.  Eyes: Conjunctivae and EOM are normal. Pupils are equal, round, and reactive to light. Right eye exhibits no discharge. Left eye exhibits no discharge. No scleral icterus.  Neck: Normal range of motion. Neck supple.  Cardiovascular: Normal rate, regular rhythm, normal heart  sounds and intact distal pulses.  Exam reveals no gallop and no friction rub.   No murmur heard. Pulmonary/Chest: Effort normal. No respiratory distress. She has wheezes ( inspiratory and expiratory wheezes in all lung fields). She has no rales. She exhibits no tenderness.  Abdominal: Soft. She exhibits no distension. There is no tenderness. There is no guarding.  Musculoskeletal: Normal range of motion. She exhibits no edema.  Lymphadenopathy:    She has no cervical adenopathy.  Neurological: She is alert and oriented to person, place, and time.  Skin: Skin is warm and dry. No rash noted. She is not diaphoretic. No erythema. No pallor.  Psychiatric: She has a normal mood and affect. Her behavior is normal.  Nursing note and vitals reviewed.   ED Course  Procedures (including critical care time) Labs Review Labs Reviewed  BASIC METABOLIC PANEL - Abnormal; Notable for the following:    Glucose, Bld 121 (*)    All other components within normal limits  CBC  I-STAT TROPOININ, ED    Imaging Review Dg Chest 2 View  09/17/2015  CLINICAL DATA:  Cough for 3 days, chest pain EXAM: CHEST  2 VIEW COMPARISON:  09/03/2015 FINDINGS: Heart and mediastinal contours are within normal limits. No focal opacities or effusions. No acute bony abnormality. IMPRESSION: No active cardiopulmonary  disease. Electronically Signed   By: Rolm Baptise M.D.   On: 09/17/2015 12:59   I have personally reviewed and evaluated these images and lab results as part of my medical decision-making.   EKG Interpretation #1  Date/Time:  Tuesday September 17 2015 12:03:24 EST Ventricular Rate:  88 PR Interval:  154 QRS Duration: 78 QT Interval:  370 QTC Calculation: 447 R Axis:   79 Text Interpretation:  Normal sinus rhythm Normal ECG Confirmed by Wilson Singer   MD, STEPHEN ([id number]) on 09/17/2015 8:39:12 PM       MDM   Final diagnoses:  Asthma exacerbation    50 y.o F presents to the ED with asthma exacerbation. No hypoxia or tachycardia. Audible wheezing heard in lung exam. Pt given continuous nebulizer and decadron with significant symptomatic improvement. No signs of respiratory distress. Pt was able to ambulate in ED and maintain O2 saturations above 97%. CXR negative. All lab work wnl. Pt has scheduled appointment with PCP tomorrow. Will d/c home with prednisone. Pt has home nebulizers, inhaler and advair. Return precautions outlined in patient discharge instructions.   Patient was discussed with and seen by Dr. Wilson Singer who agrees with the treatment plan.       Dondra Spry [place], PA-C 09/17/15 2354  Virgel Manifold, MD 09/19/15 2249

## 2015-09-17 NOTE — ED Notes (Signed)
Pt with 02 sat of 97% after ambulating in the hallway.

## 2015-09-17 NOTE — Discharge Instructions (Signed)
Asthma, Acute Bronchospasm °Acute bronchospasm caused by asthma is also referred to as an asthma attack. Bronchospasm means your air passages become narrowed. The narrowing is caused by inflammation and tightening of the muscles in the air tubes (bronchi) in your lungs. This can make it hard to breathe or cause you to wheeze and cough. °CAUSES °Possible triggers are: °· Animal dander from the skin, hair, or feathers of animals. °· Dust mites contained in house dust. °· Cockroaches. °· Pollen from trees or grass. °· Mold. °· Cigarette or tobacco smoke. °· Air pollutants such as dust, household cleaners, hair sprays, aerosol sprays, paint fumes, strong chemicals, or strong odors. °· Cold air or weather changes. Cold air may trigger inflammation. Winds increase molds and pollens in the air. °· Strong emotions such as crying or laughing hard. °· Stress. °· Certain medicines such as aspirin or beta-blockers. °· Sulfites in foods and drinks, such as dried fruits and wine. °· Infections or inflammatory conditions, such as a flu, cold, or inflammation of the nasal membranes (rhinitis). °· Gastroesophageal reflux disease (GERD). GERD is a condition where stomach acid backs up into your esophagus. °· Exercise or strenuous activity. °SIGNS AND SYMPTOMS  °· Wheezing. °· Excessive coughing, particularly at night. °· Chest tightness. °· Shortness of breath. °DIAGNOSIS  °Your health care provider will ask you about your medical history and perform a physical exam. A chest X-ray or blood testing may be performed to look for other causes of your symptoms or other conditions that may have triggered your asthma attack.  °TREATMENT  °Treatment is aimed at reducing inflammation and opening up the airways in your lungs.  Most asthma attacks are treated with inhaled medicines. These include quick relief or rescue medicines (such as bronchodilators) and controller medicines (such as inhaled corticosteroids). These medicines are sometimes  given through an inhaler or a nebulizer. Systemic steroid medicine taken by mouth or given through an IV tube also can be used to reduce the inflammation when an attack is moderate or severe. Antibiotic medicines are only used if a bacterial infection is present.  °HOME CARE INSTRUCTIONS  °· Rest. °· Drink plenty of liquids. This helps the mucus to remain thin and be easily coughed up. Only use caffeine in moderation and do not use alcohol until you have recovered from your illness. °· Do not smoke. Avoid being exposed to secondhand smoke. °· You play a critical role in keeping yourself in good health. Avoid exposure to things that cause you to wheeze or to have breathing problems. °· Keep your medicines up-to-date and available. Carefully follow your health care provider's treatment plan. °· Take your medicine exactly as prescribed. °· When pollen or pollution is bad, keep windows closed and use an air conditioner or go to places with air conditioning. °· Asthma requires careful medical care. See your health care provider for a follow-up as advised. If you are more than [redacted] weeks pregnant and you were prescribed any new medicines, let your obstetrician know about the visit and how you are doing. Follow up with your health care provider as directed. °· After you have recovered from your asthma attack, make an appointment with your outpatient doctor to talk about ways to reduce the likelihood of future attacks. If you do not have a doctor who manages your asthma, make an appointment with a primary care doctor to discuss your asthma. °SEEK IMMEDIATE MEDICAL CARE IF:  °· You are getting worse. °· You have trouble breathing. If severe, call your local   emergency services (911 in the [place]  You develop chest pain or discomfort.  You are vomiting.  You are not able to keep fluids down.  You are coughing up yellow, green, brown, or bloody sputum.  You have a fever and your symptoms suddenly get worse.  You have  trouble swallowing. MAKE SURE YOU:   Understand these instructions.  Will watch your condition.  Will get help right away if you are not doing well or get worse.   This information is not intended to replace advice given to you by your health care provider. Make sure you discuss any questions you have with your health care provider.  Keep scheduled appointment with PCP for tomorrow. Take steroids as prescribed. Use home nebulizer and inhaler as needed. Return to the ED if you experience severe worsening of your symptoms, difficulty breathing, fever, chills, chest pain, loss of consciousness.

## 2015-09-17 NOTE — ED Notes (Signed)
Pt reports sob and chest pain that started a couple of days ago. States she has done breathing treatments this morning but it has not helped. Called PCP office but was unable to get in today.

## 2015-10-03 ENCOUNTER — Emergency Department (HOSPITAL_COMMUNITY): Payer: BLUE CROSS/BLUE SHIELD

## 2015-10-03 ENCOUNTER — Emergency Department (HOSPITAL_COMMUNITY)
Admission: EM | Admit: 2015-10-03 | Discharge: 2015-10-03 | Disposition: A | Discharge status: Home / Self Care | Payer: BLUE CROSS/BLUE SHIELD | Attending: Emergency Medicine | Admitting: Emergency Medicine

## 2015-10-03 ENCOUNTER — Encounter (HOSPITAL_COMMUNITY): Payer: Self-pay | Admitting: *Deleted

## 2015-10-03 DIAGNOSIS — Z7951 Long term (current) use of inhaled steroids: Secondary | ICD-10-CM | POA: Insufficient documentation

## 2015-10-03 DIAGNOSIS — Z8673 Personal history of transient ischemic attack (TIA), and cerebral infarction without residual deficits: Secondary | ICD-10-CM | POA: Diagnosis not present

## 2015-10-03 DIAGNOSIS — M6289 Other specified disorders of muscle: Secondary | ICD-10-CM

## 2015-10-03 DIAGNOSIS — R110 Nausea: Secondary | ICD-10-CM | POA: Diagnosis not present

## 2015-10-03 DIAGNOSIS — R51 Headache: Secondary | ICD-10-CM | POA: Diagnosis not present

## 2015-10-03 DIAGNOSIS — Z7982 Long term (current) use of aspirin: Secondary | ICD-10-CM | POA: Insufficient documentation

## 2015-10-03 DIAGNOSIS — R200 Anesthesia of skin: Secondary | ICD-10-CM | POA: Insufficient documentation

## 2015-10-03 DIAGNOSIS — Z872 Personal history of diseases of the skin and subcutaneous tissue: Secondary | ICD-10-CM | POA: Insufficient documentation

## 2015-10-03 DIAGNOSIS — Z91040 Latex allergy status: Secondary | ICD-10-CM | POA: Insufficient documentation

## 2015-10-03 DIAGNOSIS — K219 Gastro-esophageal reflux disease without esophagitis: Secondary | ICD-10-CM | POA: Diagnosis not present

## 2015-10-03 DIAGNOSIS — Z87891 Personal history of nicotine dependence: Secondary | ICD-10-CM | POA: Insufficient documentation

## 2015-10-03 DIAGNOSIS — R531 Weakness: Secondary | ICD-10-CM | POA: Diagnosis present

## 2015-10-03 DIAGNOSIS — Z8639 Personal history of other endocrine, nutritional and metabolic disease: Secondary | ICD-10-CM | POA: Diagnosis not present

## 2015-10-03 DIAGNOSIS — G8929 Other chronic pain: Secondary | ICD-10-CM | POA: Insufficient documentation

## 2015-10-03 DIAGNOSIS — Z79899 Other long term (current) drug therapy: Secondary | ICD-10-CM | POA: Diagnosis not present

## 2015-10-03 DIAGNOSIS — I10 Essential (primary) hypertension: Secondary | ICD-10-CM | POA: Insufficient documentation

## 2015-10-03 DIAGNOSIS — F419 Anxiety disorder, unspecified: Secondary | ICD-10-CM | POA: Diagnosis not present

## 2015-10-03 DIAGNOSIS — D649 Anemia, unspecified: Secondary | ICD-10-CM | POA: Diagnosis not present

## 2015-10-03 DIAGNOSIS — F329 Major depressive disorder, single episode, unspecified: Secondary | ICD-10-CM | POA: Insufficient documentation

## 2015-10-03 DIAGNOSIS — Z7952 Long term (current) use of systemic steroids: Secondary | ICD-10-CM | POA: Insufficient documentation

## 2015-10-03 DIAGNOSIS — R519 Headache, unspecified: Secondary | ICD-10-CM

## 2015-10-03 DIAGNOSIS — R202 Paresthesia of skin: Secondary | ICD-10-CM | POA: Diagnosis not present

## 2015-10-03 DIAGNOSIS — R0602 Shortness of breath: Secondary | ICD-10-CM | POA: Diagnosis not present

## 2015-10-03 LAB — RAPID URINE DRUG SCREEN, HOSP PERFORMED
Amphetamines: NOT DETECTED
Barbiturates: NOT DETECTED
Benzodiazepines: NOT DETECTED
Cocaine: NOT DETECTED
OPIATES: NOT DETECTED
TETRAHYDROCANNABINOL: NOT DETECTED

## 2015-10-03 LAB — COMPREHENSIVE METABOLIC PANEL WITH GFR
ALT: 28 U/L (ref 14–54)
AST: 30 U/L (ref 15–41)
Albumin: 4.1 g/dL (ref 3.5–5.0)
Alkaline Phosphatase: 142 U/L — ABNORMAL HIGH (ref 38–126)
Anion gap: 13 (ref 5–15)
BUN: 11 mg/dL (ref 6–20)
CO2: 25 mmol/L (ref 22–32)
Calcium: 9.7 mg/dL (ref 8.9–10.3)
Chloride: 106 mmol/L (ref 101–111)
Creatinine, Ser: 0.94 mg/dL (ref 0.44–1.00)
GFR calc Af Amer: >60 mL/min
GFR calc non Af Amer: >60 mL/min
Glucose, Bld: 125 mg/dL — ABNORMAL HIGH (ref 65–99)
Potassium: 3.2 mmol/L — ABNORMAL LOW (ref 3.5–5.1)
Sodium: 144 mmol/L (ref 135–145)
Total Bilirubin: 0.4 mg/dL (ref 0.3–1.2)
Total Protein: 7 g/dL (ref 6.5–8.1)

## 2015-10-03 LAB — I-STAT CHEM 8, ED
BUN: 13 mg/dL (ref 6–20)
CALCIUM ION: 1.13 mmol/L (ref 1.12–1.23)
CHLORIDE: 105 mmol/L (ref 101–111)
Creatinine, Ser: 0.8 mg/dL (ref 0.44–1.00)
GLUCOSE: 125 mg/dL — AB (ref 65–99)
HCT: 44 % (ref 36.0–46.0)
HEMOGLOBIN: 15 g/dL (ref 12.0–15.0)
Potassium: 3.1 mmol/L — ABNORMAL LOW (ref 3.5–5.1)
Sodium: 143 mmol/L (ref 135–145)
TCO2: 22 mmol/L (ref 0–100)

## 2015-10-03 LAB — ETHANOL

## 2015-10-03 LAB — URINALYSIS, ROUTINE W REFLEX MICROSCOPIC
Bilirubin Urine: NEGATIVE
Glucose, UA: NEGATIVE mg/dL
Hgb urine dipstick: NEGATIVE
KETONES UR: NEGATIVE mg/dL
LEUKOCYTES UA: NEGATIVE
NITRITE: NEGATIVE
PH: 7.5 (ref 5.0–8.0)
Protein, ur: NEGATIVE mg/dL
Specific Gravity, Urine: 1.01 (ref 1.005–1.030)

## 2015-10-03 LAB — CBC
HCT: 42.5 % (ref 36.0–46.0)
Hemoglobin: 13.7 g/dL (ref 12.0–15.0)
MCH: 28.7 pg (ref 26.0–34.0)
MCHC: 32.2 g/dL (ref 30.0–36.0)
MCV: 89.1 fL (ref 78.0–100.0)
Platelets: 179 K/uL (ref 150–400)
RBC: 4.77 MIL/uL (ref 3.87–5.11)
RDW: 13.8 % (ref 11.5–15.5)
WBC: 8.4 K/uL (ref 4.0–10.5)

## 2015-10-03 LAB — DIFFERENTIAL
BASOS PCT: 0 %
Basophils Absolute: 0 10*3/uL (ref 0.0–0.1)
Eosinophils Absolute: 0.3 10*3/uL (ref 0.0–0.7)
Eosinophils Relative: 3 %
LYMPHS ABS: 2.4 10*3/uL (ref 0.7–4.0)
LYMPHS PCT: 29 %
MONO ABS: 0.5 10*3/uL (ref 0.1–1.0)
MONOS PCT: 6 %
NEUTROS ABS: 5.2 10*3/uL (ref 1.7–7.7)
Neutrophils Relative %: 62 %

## 2015-10-03 LAB — I-STAT TROPONIN, ED: Troponin i, poc: 0 ng/mL (ref 0.00–0.08)

## 2015-10-03 LAB — APTT: aPTT: 27 s (ref 24–37)

## 2015-10-03 LAB — CBG MONITORING, ED: GLUCOSE-CAPILLARY: 111 mg/dL — AB (ref 65–99)

## 2015-10-03 LAB — PROTIME-INR
INR: 1.14 (ref 0.00–1.49)
Prothrombin Time: 14.8 seconds (ref 11.6–15.2)

## 2015-10-03 MED ORDER — LORAZEPAM 2 MG/ML IJ SOLN
1.0000 mg | Freq: Once | INTRAMUSCULAR | Status: AC
  Administered 2015-10-03: 1 mg via INTRAVENOUS
  Filled 2015-10-03: qty 1

## 2015-10-03 MED ORDER — PROMETHAZINE HCL 25 MG/ML IJ SOLN
12.5000 mg | Freq: Once | INTRAMUSCULAR | Status: AC
  Administered 2015-10-03: 12.5 mg via INTRAVENOUS
  Filled 2015-10-03: qty 1

## 2015-10-03 MED ORDER — KETOROLAC TROMETHAMINE 30 MG/ML IJ SOLN
30.0000 mg | Freq: Once | INTRAMUSCULAR | Status: AC
  Administered 2015-10-03: 30 mg via INTRAVENOUS
  Filled 2015-10-03: qty 1

## 2015-10-03 NOTE — ED Notes (Signed)
Stroke team at bedside assessing pt

## 2015-10-03 NOTE — ED Provider Notes (Signed)
8:23 PM MRI is negative. Likely a complicated migraine given headache. Per prior plan, will d/c home. Is able to walk to bathroom with assistance (usually uses a cane)  Results for orders placed or performed during the hospital encounter of 10/03/15  Ethanol  Result Value Ref Range   Alcohol, Ethyl (B) <5 <5 mg/dL  Protime-INR  Result Value Ref Range   Prothrombin Time 14.8 11.6 - 15.2 seconds   INR 1.14 0.00 - 1.49  APTT  Result Value Ref Range   aPTT 27 24 - 37 seconds  CBC  Result Value Ref Range   WBC 8.4 4.0 - 10.5 K/uL   RBC 4.77 3.87 - 5.11 MIL/uL   Hemoglobin 13.7 12.0 - 15.0 g/dL   HCT 42.5 36.0 - 46.0 %   MCV 89.1 78.0 - 100.0 fL   MCH 28.7 26.0 - 34.0 pg   MCHC 32.2 30.0 - 36.0 g/dL   RDW 13.8 11.5 - 15.5 %   Platelets 179 150 - 400 K/uL  Differential  Result Value Ref Range   Neutrophils Relative % 62 %   Neutro Abs 5.2 1.7 - 7.7 K/uL   Lymphocytes Relative 29 %   Lymphs Abs 2.4 0.7 - 4.0 K/uL   Monocytes Relative 6 %   Monocytes Absolute 0.5 0.1 - 1.0 K/uL   Eosinophils Relative 3 %   Eosinophils Absolute 0.3 0.0 - 0.7 K/uL   Basophils Relative 0 %   Basophils Absolute 0.0 0.0 - 0.1 K/uL  Comprehensive metabolic panel  Result Value Ref Range   Sodium 144 135 - 145 mmol/L   Potassium 3.2 (L) 3.5 - 5.1 mmol/L   Chloride 106 101 - 111 mmol/L   CO2 25 22 - 32 mmol/L   Glucose, Bld 125 (H) 65 - 99 mg/dL   BUN 11 6 - 20 mg/dL   Creatinine, Ser 0.94 0.44 - 1.00 mg/dL   Calcium 9.7 8.9 - 10.3 mg/dL   Total Protein 7.0 6.5 - 8.1 g/dL   Albumin 4.1 3.5 - 5.0 g/dL   AST 30 15 - 41 U/L   ALT 28 14 - 54 U/L   Alkaline Phosphatase 142 (H) 38 - 126 U/L   Total Bilirubin 0.4 0.3 - 1.2 mg/dL   GFR calc non Af Amer >60 >60 mL/min   GFR calc Af Amer >60 >60 mL/min   Anion gap 13 5 - 15  Urine rapid drug screen (hosp performed)not at [facility]  Result Value Ref Range   Opiates NONE DETECTED NONE DETECTED   Cocaine NONE DETECTED NONE DETECTED   Benzodiazepines NONE  DETECTED NONE DETECTED   Amphetamines NONE DETECTED NONE DETECTED   Tetrahydrocannabinol NONE DETECTED NONE DETECTED   Barbiturates NONE DETECTED NONE DETECTED  Urinalysis, Routine w reflex microscopic (not at [facility])  Result Value Ref Range   Color, Urine YELLOW YELLOW   APPearance CLEAR CLEAR   Specific Gravity, Urine 1.010 1.005 - 1.030   pH 7.5 5.0 - 8.0   Glucose, UA NEGATIVE NEGATIVE mg/dL   Hgb urine dipstick NEGATIVE NEGATIVE   Bilirubin Urine NEGATIVE NEGATIVE   Ketones, ur NEGATIVE NEGATIVE mg/dL   Protein, ur NEGATIVE NEGATIVE mg/dL   Nitrite NEGATIVE NEGATIVE   Leukocytes, UA NEGATIVE NEGATIVE  I-Stat Chem 8, ED  (not at [facility])  Result Value Ref Range   Sodium 143 135 - 145 mmol/L   Potassium 3.1 (L) 3.5 - 5.1 mmol/L   Chloride 105 101 - 111 mmol/L   BUN 13  6 - 20 mg/dL   Creatinine, Ser 0.80 0.44 - 1.00 mg/dL   Glucose, Bld 125 (H) 65 - 99 mg/dL   Calcium, Ion 1.13 1.12 - 1.23 mmol/L   TCO2 22 0 - 100 mmol/L   Hemoglobin 15.0 12.0 - 15.0 g/dL   HCT 44.0 36.0 - 46.0 %  I-stat troponin, ED (not at [facility])  Result Value Ref Range   Troponin i, poc 0.00 0.00 - 0.08 ng/mL   Comment 3          CBG monitoring, ED  Result Value Ref Range   Glucose-Capillary 111 (H) 65 - 99 mg/dL   Dg Chest 2 View  09/17/2015  CLINICAL DATA:  Cough for 3 days, chest pain EXAM: CHEST  2 VIEW COMPARISON:  09/03/2015 FINDINGS: Heart and mediastinal contours are within normal limits. No focal opacities or effusions. No acute bony abnormality. IMPRESSION: No active cardiopulmonary disease. Electronically Signed   By: Rolm Baptise M.D.   On: 09/17/2015 12:59   Ct Head Wo Contrast  10/03/2015  ADDENDUM REPORT: 10/03/2015 13:36 ADDENDUM: These results were called by telephone at the time of interpretation on 10/03/2015 at 1:00 pm to Dr. Nicole Kindred, who verbally acknowledged these results. Electronically Signed   By: Franki Cabot M.D.   On: 10/03/2015 13:36  10/03/2015  CLINICAL DATA:  Code  stroke. Sudden onset of headache and left arm weakness. Additional history of hypertension and TIA. EXAM: CT HEAD WITHOUT CONTRAST TECHNIQUE: Contiguous axial images were obtained from the base of the skull through the vertex without intravenous contrast. COMPARISON:  Head CT dated 10/28/2012 and brain MRI dated 06/02/2015. FINDINGS: Characterization slightly limited by oblique patient positioning. Ventricles remain normal in size and configuration. All areas of the brain demonstrate normal gray-white matter differentiation. There is no mass, hemorrhage, edema or other evidence of acute parenchymal abnormality. No extra-axial hemorrhage. No osseous abnormality. Paranasal sinuses and mastoid air cells are clear. Superficial soft tissues are unremarkable. IMPRESSION: Negative head CT.  No intracranial mass, hemorrhage or edema. Electronically Signed: By: Franki Cabot M.D. On: 10/03/2015 12:48   Mr Brain Wo Contrast  10/03/2015  CLINICAL DATA:  Recurrent episode of headache with weakness and numbness involving her left side. Etiology is unclear but is likely psychophysiologic. Patient has no objective signs of an acute neurologic deficit. She has no clinical signs of an acute stroke or TIA. Emergency department visit, and neurologic evaluation, including MRI, 06/02/2015, was consistent with a conversion disorder. EXAM: MRI HEAD WITHOUT CONTRAST TECHNIQUE: Multiplanar, multiecho pulse sequences of the brain and surrounding structures were obtained without intravenous contrast. COMPARISON:  CT head earlier today.  MR brain 06/02/2015. FINDINGS: No evidence for acute infarction, hemorrhage, mass lesion, hydrocephalus, or extra-axial fluid. Normal cerebral volume. No white matter disease. Flow voids are maintained throughout the carotid, basilar, and vertebral arteries. There are no areas of chronic hemorrhage. Pituitary, pineal, and cerebellar tonsils unremarkable. No upper cervical lesions. IMPRESSION: Negative exam.  Electronically Signed   By: Staci Righter M.D.   On: 10/03/2015 20:15      Sherwood Gambler, MD 10/03/15 2024

## 2015-10-03 NOTE — ED Notes (Signed)
CBG 111 

## 2015-10-03 NOTE — ED Notes (Signed)
Neuro MD at bedside

## 2015-10-03 NOTE — ED Provider Notes (Signed)
CSN: [id number]     Arrival date & time 10/03/15  1229 History   First MD Initiated Contact with Patient 10/03/15 1245     Chief Complaint  Patient presents with  . Code Stroke    An emergency department physician performed an initial assessment on this suspected stroke patient at 1227.    HPI   50 year old female presenting for left-sided weakness, numbness, tingling first noted this morning at 11:15 AM. Symptoms started with numbness and tingling of her face which then spread to her left arm and leg. She then developed weakness of her left arm and leg and called EMS. She had some shortness of breath associated with the onset of her symptoms which has resolved. She also has nausea with the onset of her symptoms which has since resolved. Pt additionally reports headache behind her left eye which is consistent with her chronic headache, denies changes in vision associated.  She denies chest pain, fevers, recent illness, N/V/D, abdominal pain  Past Medical History  Diagnosis Date  . Depression   . Hypercholesterolemia     no current med.  . Anxiety   . History of blood transfusion   . Stroke ([place]) 10/27/2012    TIA   1- 10/29/11-weakness left side  . PONV (postoperative nausea and vomiting)     states severe  . Migraines   . History of TIA (transient ischemic attack)     x 2; left-sided weakness  . GERD (gastroesophageal reflux disease)   . Hypertension     states under control with meds., has been on med. since 06/2011  . Seasonal allergies   . Chronic bronchitis ([place])   . Family history of adverse reaction to anesthesia     pt's mother has hx. of post-op N/V  . Acne   . Osteoarthritis   . Keloid [id number]    umbilical  . Anemia     takes iron supplement   Past Surgical History  Procedure Laterality Date  . Vaginal hysterectomy      for DUB in 10/2003; partial  . Knee arthroscopy Left 02/2000  . Total knee arthroplasty Left 12/31/2014    Procedure: TOTAL LEFT KNEE ARTHROPLASTY;   Surgeon: Frederik Pear, MD;  Location: [facility];  Service: Orthopedics;  Laterality: Left;  . Diagnostic laparoscopy  early 2005  . Cholecystectomy  08/27/2009  . Laminotomy Left 03/27/2010    L5-S1; microdissection  . Lumbar fusion Left 07/18/2010    L4-5; L5-S1  . Carpal tunnel release Left 12/2013  . Carpal tunnel release Right 03/29/2015  . Functional endoscopic sinus surgery  04/30/2006  . Ethmoidectomy Bilateral 04/30/2006  . Nasal turbinate reduction  04/30/2006    inferior  . Total abdominal hysterectomy  11/01/2003    with lysis of adhesions; ovaries retained  . Laminotomy Bilateral 07/12/2007    L4-5  . Hysteroscopy w/d&c  04/21/2002    with lysis of adhesions  . Lumbar fusion  04/10/2014    L3-4, L4-5, L5-S1   Family History  Problem Relation Age of Onset  . Cervical cancer Maternal Grandmother   . Cancer Maternal Grandmother     mouth  . Heart attack Maternal Grandmother 55  . Hypertension Brother   . Heart failure Brother     ICD  . Anesthesia problems Mother     post-op N/V   Social History  Substance Use Topics  . Smoking status: Former Smoker -- 0.00 packs/day    Quit date: 07/20/2004  . Smokeless tobacco: Never  Used  . Alcohol Use: No   OB History    No data available     Review of Systems  Constitutional: Negative.   HENT: Negative.   Eyes: Negative.   Respiratory: Negative.   Cardiovascular: Negative.   Gastrointestinal: Negative.   Endocrine: Negative.   Genitourinary: Negative.   Skin: Negative.       Allergies  Lisinopril; Topamax; Latex; and Oxycodone-acetaminophen  Home Medications   Prior to Admission medications   Medication Sig Start Date End Date Taking? Authorizing Provider  albuterol (PROVENTIL HFA;VENTOLIN HFA) 108 (90 BASE) MCG/ACT inhaler Inhale into the lungs every 6 (six) hours as needed for wheezing or shortness of breath.    Historical Provider, MD  albuterol (PROVENTIL) (5 MG/ML) 0.5% nebulizer solution Take 2.5 mg by nebulization  every 6 (six) hours as needed for wheezing or shortness of breath.    Historical Provider, MD  aspirin 81 MG tablet Take 81 mg by mouth daily.    Historical Provider, MD  atenolol (TENORMIN) 50 MG tablet Take 50 mg by mouth daily.    Historical Provider, MD  busPIRone (BUSPAR) 15 MG tablet Take 15 mg by mouth 2 (two) times daily.     Historical Provider, MD  cyproheptadine (PERIACTIN) 4 MG tablet Take 8 mg by mouth 2 (two) times daily.    Historical Provider, MD  ferrous sulfate 325 (65 FE) MG tablet Take 325 mg by mouth 2 (two) times daily with a meal.    Historical Provider, MD  fluticasone (FLONASE) 50 MCG/ACT nasal spray Place 2 sprays into both nostrils daily.  12/19/13   Historical Provider, MD  guaiFENesin-codeine 100-10 MG/5ML syrup Take 5 mLs by mouth 3 (three) times daily as needed for cough. 09/17/15   Samantha Tripp Dowless, PA-C  hydrochlorothiazide (MICROZIDE) 12.5 MG capsule Take 12.5 mg by mouth daily.    Historical Provider, MD  HYDROcodone-acetaminophen (NORCO/VICODIN) 5-325 MG per tablet Take 1-2 tablets by mouth every 6 (six) hours as needed for moderate pain. 12/31/14   Leighton Parody, PA-C  HYDROcodone-acetaminophen (NORCO/VICODIN) 5-325 MG tablet Take 1 tablet by mouth every 4 (four) hours as needed. 04/24/15   Donnie Mesa, MD  methocarbamol (ROBAXIN) 500 MG tablet Take 500 mg by mouth 4 (four) times daily.    Historical Provider, MD  neomycin-bacitracin-polymyxin (NEOSPORIN) [phone] ointment Apply 1 application topically daily as needed (rash under chin).    Historical Provider, MD  ondansetron (ZOFRAN) 8 MG tablet Take 8 mg by mouth every 8 (eight) hours as needed for nausea or vomiting.    Historical Provider, MD  pantoprazole (PROTONIX) 40 MG tablet Take 40 mg by mouth daily.    Historical Provider, MD  predniSONE (DELTASONE) 50 MG tablet Take 1 tablet (50 mg total) by mouth daily with breakfast. 09/17/15   Samantha Tripp Dowless, PA-C  sertraline (ZOLOFT) 100 MG tablet  Take 100-200 mg by mouth 2 (two) times daily. Take 100 mg in the am and 200 mg in the pm    Historical Provider, MD  tizanidine (ZANAFLEX) 2 MG capsule Take 4 mg by mouth 2 (two) times daily.     Historical Provider, MD  Vitamin D, Ergocalciferol, (DRISDOL) 50000 UNITS CAPS Take 50,000 Units by mouth every 7 (seven) days. mondays    Historical Provider, MD  zonisamide (ZONEGRAN) 100 MG capsule Take 200 mg by mouth 2 (two) times daily.    Historical Provider, MD   BP 139/85 mmHg  Pulse 98  Resp 19  Wt 115.1  kg  SpO2 98% Physical Exam  Constitutional: She appears well-developed and well-nourished.  HENT:  Head: Normocephalic.  Eyes: Pupils are equal, round, and reactive to light.  Neck: Normal range of motion.  Cardiovascular: Normal rate and regular rhythm.   Pulmonary/Chest: Effort normal and breath sounds normal.  Abdominal: Soft. Bowel sounds are normal. She exhibits no distension. There is no tenderness.  Cranial Nerves II - XII - II - Visual field intact OU. III, IV, VI - Extraocular movements intact. V - Facial sensation diminished on the left aspect of her face. VII - Facial movement intact bilaterally. X - Palate elevates symmetrically, no dysarthria. XI - Chin turning intact, shoulder shrug limited on the left XII - Tongue protrusion intact.  Motor Strength - The patient's strength was 5/5 RU and RL extremities, 3/4 in LU and LL extremities, Bulk was normal and fasciculations were absent.  Motor Tone - Muscle tone was assessed at the neck and appendages and was normal.  Coordination - The patient had normal movements in the hands with no ataxia or dysmetria. Tremor was absent.  Gait and Station - deferred due to safety concerns.   ED Course  Procedures (including critical care time) Labs Review Labs Reviewed  I-STAT CHEM 8, ED - Abnormal; Notable for the following:    Potassium 3.1 (*)    Glucose, Bld 125 (*)    All other components within normal limits  CBG  MONITORING, ED - Abnormal; Notable for the following:    Glucose-Capillary 111 (*)    All other components within normal limits  PROTIME-INR  APTT  CBC  DIFFERENTIAL  ETHANOL  COMPREHENSIVE METABOLIC PANEL  URINE RAPID DRUG SCREEN, HOSP PERFORMED  URINALYSIS, ROUTINE W REFLEX MICROSCOPIC (NOT AT [facility])  I-STAT TROPOININ, ED    Imaging Review Ct Head Wo Contrast  10/03/2015  CLINICAL DATA:  Code stroke. Sudden onset of headache and left arm weakness. Additional history of hypertension and TIA. EXAM: CT HEAD WITHOUT CONTRAST TECHNIQUE: Contiguous axial images were obtained from the base of the skull through the vertex without intravenous contrast. COMPARISON:  Head CT dated 10/28/2012 and brain MRI dated 06/02/2015. FINDINGS: Characterization slightly limited by oblique patient positioning. Ventricles remain normal in size and configuration. All areas of the brain demonstrate normal gray-white matter differentiation. There is no mass, hemorrhage, edema or other evidence of acute parenchymal abnormality. No extra-axial hemorrhage. No osseous abnormality. Paranasal sinuses and mastoid air cells are clear. Superficial soft tissues are unremarkable. IMPRESSION: Negative head CT.  No intracranial mass, hemorrhage or edema. Electronically Signed   By: Franki Cabot M.D.   On: 10/03/2015 12:48   I have personally reviewed and evaluated these images and lab results as part of my medical decision-making.   EKG Interpretation None      MDM   Final diagnoses:  None   50 y/o F presenting for left sided weakness and headache. Neurology was called for assessment and they recommended MRI to fully rule out stroke as she has significant risk factors, (obesity, hypertension, questionable TIA) EKG was sinus rhythm and showed no evidence of ischemia. Troponin was 0.0. Headache improved with medical management.    Alyssa A. Lincoln Brigham MD, [facility] Resident PGY-2 Pager [phone]     Veatrice Bourbon, MD 10/03/15 1657  Leonard Schwartz, MD 10/03/15 [phone]

## 2015-10-03 NOTE — ED Notes (Signed)
Code stroke canceled at 1257 per MD Nicole Kindred

## 2015-10-03 NOTE — ED Notes (Signed)
Pt presents via [facility] from home as a code stroke.  Pt was home alone when she had sudden onset HA and left side weakness and tingling.  Pt pushed alarm buttom to notify EMS.  Pt a x 4 on arrival, left side weakness noted.  Pt also reports difficulty swallowing.  BP-126/78 CBG-131.

## 2015-10-03 NOTE — Code Documentation (Signed)
50yo female arriving to [facility] via [place] at 58.  Patient from home where she had sudden onset HA at 1130 followed by left sided tingling and weakness.  EMS assessed left facial droop and left sided weakness and activated a code stroke.  Labs drawn on arrival and patient to CT.  Stroke team to the bedside.  CT completed.  Patient to E40.  NIHSS 5, see documentation for details and code stroke times.  Patient with inconsistent exam.  Patient tearful during exam reporting "pins and needle" sensation to left side.  Patient initially not moving left arm or leg, but later witnessed placing her left arm on the side rail and with repeated encouragement held left arm up for 10 seconds with minimal drift.  Patient did not lift left leg off the bed during exam but movement was witnessed in the left leg.  Patient reporting decreased sensation on the left side.  Of note, patient with h/o multiple episodes of similar symptoms in the past with negative MRIs per the EMR.  Dr. Nicole Kindred at the bedside.  Code stroke canceled.  Patient to go to MRI per MD.  Bedside handoff with ED RN Hayley.

## 2015-10-03 NOTE — Consult Note (Signed)
Admission H&P    Chief Complaint: Weakness and sensory changes involving left side.  HPI: Wanda [name] is an 50 y.o. female with a history of hypertension, hyperlipidemia, depression and anxiety, as well as obesity, brought to the ED and code stroke status with complaint of new onset left-sided weakness and sensory changes which she describes as tingling. She also complaining of a headache. Onset was at 11:30 AM today. CT scan of her head showed no acute intracranial abnormality. Exam findings were inconsistent with especially poor effort in evaluating left extremities. Patient had clear movement of her left upper extremity when distracted him about indicated complete paralysis when asked to move left extremities voluntarily. She had no facial weakness. Deep tendon reflexes were normal and symmetrical. She had a similar presentation in November 2016 and was evaluated by neurology at that time as well. It was felt that her presenting symptoms were nonphysiologic and possibly manifestation of conversion. With no objective deficits determined code stroke was canceled.  Past Medical History  Diagnosis Date  . Depression   . Hypercholesterolemia     no current med.  . Anxiety   . History of blood transfusion   . Stroke ([place]) 10/27/2012    TIA   1- 10/29/11-weakness left side  . PONV (postoperative nausea and vomiting)     states severe  . Migraines   . History of TIA (transient ischemic attack)     x 2; left-sided weakness  . GERD (gastroesophageal reflux disease)   . Hypertension     states under control with meds., has been on med. since 06/2011  . Seasonal allergies   . Chronic bronchitis ([place])   . Family history of adverse reaction to anesthesia     pt's mother has hx. of post-op N/V  . Acne   . Osteoarthritis   . Keloid 08/1222    umbilical  . Anemia     takes iron supplement    Past Surgical History  Procedure Laterality Date  . Vaginal hysterectomy      for DUB in 10/2003;  partial  . Knee arthroscopy Left 02/2000  . Total knee arthroplasty Left 12/31/2014    Procedure: TOTAL LEFT KNEE ARTHROPLASTY;  Surgeon: Frederik Pear, MD;  Location: [facility];  Service: Orthopedics;  Laterality: Left;  . Diagnostic laparoscopy  early 2005  . Cholecystectomy  08/27/2009  . Laminotomy Left 03/27/2010    L5-S1; microdissection  . Lumbar fusion Left 07/18/2010    L4-5; L5-S1  . Carpal tunnel release Left 12/2013  . Carpal tunnel release Right 03/29/2015  . Functional endoscopic sinus surgery  04/30/2006  . Ethmoidectomy Bilateral 04/30/2006  . Nasal turbinate reduction  04/30/2006    inferior  . Total abdominal hysterectomy  11/01/2003    with lysis of adhesions; ovaries retained  . Laminotomy Bilateral 07/12/2007    L4-5  . Hysteroscopy w/d&c  04/21/2002    with lysis of adhesions  . Lumbar fusion  04/10/2014    L3-4, L4-5, L5-S1    Family History  Problem Relation Age of Onset  . Cervical cancer Maternal Grandmother   . Cancer Maternal Grandmother     mouth  . Heart attack Maternal Grandmother 55  . Hypertension Brother   . Heart failure Brother     ICD  . Anesthesia problems Mother     post-op N/V   Social History:  reports that she quit smoking about 11 years ago. She has never used smokeless tobacco. She reports that she does  not drink alcohol or use illicit drugs.  Allergies:  Allergies  Allergen Reactions  . Lisinopril Swelling and Other (See Comments)    SWELLING FACE/LIPS/NECK  . Topamax [Topiramate] Other (See Comments)    SHORT-TERM MEMORY LOSS  . Latex Rash  . Oxycodone-Acetaminophen Itching   Educations: Patient's preadmission medications were reviewed by me.  ROS: History obtained from the patient  General ROS: negative for - chills, fatigue, fever, night sweats, weight gain or weight loss Psychological ROS: negative for - behavioral disorder, hallucinations, memory difficulties, mood swings or suicidal ideation Ophthalmic ROS: negative for - blurry  vision, double vision, eye pain or loss of vision ENT ROS: negative for - epistaxis, nasal discharge, oral lesions, sore throat, tinnitus or vertigo Allergy and Immunology ROS: negative for - hives or itchy/watery eyes Hematological and Lymphatic ROS: negative for - bleeding problems, bruising or swollen lymph nodes Endocrine ROS: negative for - galactorrhea, hair pattern changes, polydipsia/polyuria or temperature intolerance Respiratory ROS: negative for - cough, hemoptysis, shortness of breath or wheezing Cardiovascular ROS: negative for - chest pain, dyspnea on exertion, edema or irregular heartbeat Gastrointestinal ROS: negative for - abdominal pain, diarrhea, hematemesis, nausea/vomiting or stool incontinence Genito-Urinary ROS: negative for - dysuria, hematuria, incontinence or urinary frequency/urgency Musculoskeletal ROS: negative for - joint swelling or muscular weakness Neurological ROS: as noted in HPI Dermatological ROS: negative for rash and skin lesion changes  Physical Examination: Blood pressure 116/55, pulse 86, resp. rate 14, weight 115.1 kg (253 lb 12 oz), SpO2 97 %.  HEENT-  Normocephalic, no lesions, without obvious abnormality.  Normal external eye and conjunctiva.  Normal TM's bilaterally.  Normal auditory canals and external ears. Normal external nose, mucus membranes and septum.  Normal pharynx. Neck supple with no masses, nodes, nodules or enlargement. Cardiovascular - regular rate and rhythm, S1, S2 normal, no murmur, click, rub or gallop Lungs - chest clear, no wheezing, rales, normal symmetric air entry Abdomen - soft, non-tender; bowel sounds normal; no masses,  no organomegaly Extremities - no joint deformities, effusion, or inflammation and no edema  Neurologic Examination: Mental Status: Alert, oriented, emotionally labile with tearfulness.  Speech fluent without evidence of aphasia. Able to follow commands without difficulty. Cranial Nerves: II-Visual  fields were normal. III/IV/VI-Pupils were equal and reacted normally to light. Extraocular movements were full and conjugate.    V/VII-no facial numbness and no facial weakness. VIII-normal. X-no dysarthria. XI: trapezius strength/neck flexion strength normal bilaterally XII-midline tongue extension with normal strength. Motor: Poor effort with voluntary movements with testing of left upper and lower extremities. Responses were unreliable and inconsistent. Strength of right extremities was normal. Muscle tone was normal throughout. Sensory: Normal throughout. Deep Tendon Reflexes: 1+ and symmetric. Plantars: Mute bilaterally Cerebellar: Normal finger-to-nose testing with right upper extremity.  Results for orders placed or performed during the hospital encounter of 10/03/15 (from the past 48 hour(s))  Ethanol     Status: None   Collection Time: 10/03/15 12:29 PM  Result Value Ref Range   Alcohol, Ethyl (B) <5 <5 mg/dL    Comment:        LOWEST DETECTABLE LIMIT FOR SERUM ALCOHOL IS 5 mg/dL FOR MEDICAL PURPOSES ONLY   Protime-INR     Status: None   Collection Time: 10/03/15 12:29 PM  Result Value Ref Range   Prothrombin Time 14.8 11.6 - 15.2 seconds   INR 1.14 0.00 - 1.49  APTT     Status: None   Collection Time: 10/03/15 12:29 PM  Result  Value Ref Range   aPTT 27 24 - 37 seconds  CBC     Status: None   Collection Time: 10/03/15 12:29 PM  Result Value Ref Range   WBC 8.4 4.0 - 10.5 K/uL   RBC 4.77 3.87 - 5.11 MIL/uL   Hemoglobin 13.7 12.0 - 15.0 g/dL   HCT 42.5 36.0 - 46.0 %   MCV 89.1 78.0 - 100.0 fL   MCH 28.7 26.0 - 34.0 pg   MCHC 32.2 30.0 - 36.0 g/dL   RDW 13.8 11.5 - 15.5 %   Platelets 179 150 - 400 K/uL  Differential     Status: None   Collection Time: 10/03/15 12:29 PM  Result Value Ref Range   Neutrophils Relative % 62 %   Neutro Abs 5.2 1.7 - 7.7 K/uL   Lymphocytes Relative 29 %   Lymphs Abs 2.4 0.7 - 4.0 K/uL   Monocytes Relative 6 %   Monocytes Absolute  0.5 0.1 - 1.0 K/uL   Eosinophils Relative 3 %   Eosinophils Absolute 0.3 0.0 - 0.7 K/uL   Basophils Relative 0 %   Basophils Absolute 0.0 0.0 - 0.1 K/uL  Comprehensive metabolic panel     Status: Abnormal   Collection Time: 10/03/15 12:29 PM  Result Value Ref Range   Sodium 144 135 - 145 mmol/L   Potassium 3.2 (L) 3.5 - 5.1 mmol/L   Chloride 106 101 - 111 mmol/L   CO2 25 22 - 32 mmol/L   Glucose, Bld 125 (H) 65 - 99 mg/dL   BUN 11 6 - 20 mg/dL   Creatinine, Ser 0.94 0.44 - 1.00 mg/dL   Calcium 9.7 8.9 - 10.3 mg/dL   Total Protein 7.0 6.5 - 8.1 g/dL   Albumin 4.1 3.5 - 5.0 g/dL   AST 30 15 - 41 U/L   ALT 28 14 - 54 U/L   Alkaline Phosphatase 142 (H) 38 - 126 U/L   Total Bilirubin 0.4 0.3 - 1.2 mg/dL   GFR calc non Af Amer >60 >60 mL/min   GFR calc Af Amer >60 >60 mL/min    Comment: (NOTE) The eGFR has been calculated using the CKD EPI equation. This calculation has not been validated in all clinical situations. eGFR's persistently <60 mL/min signify possible Chronic Kidney Disease.    Anion gap 13 5 - 15  I-stat troponin, ED (not at [facility], [facility])     Status: None   Collection Time: 10/03/15 12:34 PM  Result Value Ref Range   Troponin i, poc 0.00 0.00 - 0.08 ng/mL   Comment 3            Comment: Due to the release kinetics of cTnI, a negative result within the first hours of the onset of symptoms does not rule out myocardial infarction with certainty. If myocardial infarction is still suspected, repeat the test at appropriate intervals.   I-Stat Chem 8, ED  (not at [facility], [facility])     Status: Abnormal   Collection Time: 10/03/15 12:36 PM  Result Value Ref Range   Sodium 143 135 - 145 mmol/L   Potassium 3.1 (L) 3.5 - 5.1 mmol/L   Chloride 105 101 - 111 mmol/L   BUN 13 6 - 20 mg/dL   Creatinine, Ser 0.80 0.44 - 1.00 mg/dL   Glucose, Bld 125 (H) 65 - 99 mg/dL   Calcium, Ion 1.13 1.12 - 1.23 mmol/L   TCO2 22 0 - 100 mmol/L   Hemoglobin 15.0 12.0 - 15.0  g/dL   HCT 44.0 36.0 -  46.0 %  CBG monitoring, ED     Status: Abnormal   Collection Time: 10/03/15 12:37 PM  Result Value Ref Range   Glucose-Capillary 111 (H) 65 - 99 mg/dL   Ct Head Wo Contrast  10/03/2015  ADDENDUM REPORT: 10/03/2015 13:36 ADDENDUM: These results were called by telephone at the time of interpretation on 10/03/2015 at 1:00 pm to Dr. Nicole Kindred, who verbally acknowledged these results. Electronically Signed   By: Franki Cabot M.D.   On: 10/03/2015 13:36  10/03/2015  CLINICAL DATA:  Code stroke. Sudden onset of headache and left arm weakness. Additional history of hypertension and TIA. EXAM: CT HEAD WITHOUT CONTRAST TECHNIQUE: Contiguous axial images were obtained from the base of the skull through the vertex without intravenous contrast. COMPARISON:  Head CT dated 10/28/2012 and brain MRI dated 06/02/2015. FINDINGS: Characterization slightly limited by oblique patient positioning. Ventricles remain normal in size and configuration. All areas of the brain demonstrate normal gray-white matter differentiation. There is no mass, hemorrhage, edema or other evidence of acute parenchymal abnormality. No extra-axial hemorrhage. No osseous abnormality. Paranasal sinuses and mastoid air cells are clear. Superficial soft tissues are unremarkable. IMPRESSION: Negative head CT.  No intracranial mass, hemorrhage or edema. Electronically Signed: By: Franki Cabot M.D. On: 10/03/2015 12:48    Assessment/Plan Recurrent episode of headache with weakness and numbness involving her left side. Etiology is unclear but is likely psychophysiologic. Patient has no objective signs of an acute neurologic deficit. She has no clinical signs of an acute stroke or TIA.  Recommend MRI of the brain without contrast to rule out subacute ischemic acute brain lesion. No further neurological intervention is indicated if MRI study is unremarkable.  C.R. Nicole Kindred, MD [facility]  10/03/2015, 2:04 PM

## 2015-10-03 NOTE — Discharge Instructions (Signed)
You were seen for weakness, numbness and tingling. Your testing was normal. Follow up with your PCP within the next week. If you have further weakness, numbness or tingling return to the ED for evaluation

## 2015-10-10 ENCOUNTER — Other Ambulatory Visit: Payer: Self-pay | Admitting: Orthopedic Surgery

## 2015-10-30 NOTE — Pre-Procedure Instructions (Signed)
    Wanda [name]  10/30/2015      [id number] - [place], [place] [address] [place] [address] Phone: [phone] Fax: [phone]    Your procedure is scheduled on Monday, April 17  Report to [facility] Admitting at 5:30 A.M.  Call this number if you have problems the morning of surgery:  [phone]             Any questions call [phone] Monday-Friday 8am-4pm   Remember:  Do not eat food or drink liquids after midnight on Sunday April 16   Take these medicines the morning of surgery with A SIP OF WATER: albuterol inhaler-bring to hospital, albuterol nebulizer if needed, atenolol (tenormin), buspar (buspirone), zyrtec, cyproheptadine (periactin), advir-bring to hospital, gabapentin(neurontin), robaxin , protonix, zoloft, tizanidine(zanaflex) zonisamide (zonegran)              Stop advil, motrin, ibubrofen, aspirin containing  medicines and herbal medicines.    Do not wear jewelry, make-up or nail polish.  Do not wear lotions, powders, or perfumes.  You may not wear deodorant.  Do not shave 48 hours prior to surgery.  Men may shave face and neck.  Do not bring valuables to the hospital.  [facility] is not responsible for any belongings or valuables.  Contacts, dentures or bridgework may not be worn into surgery.  Leave your suitcase in the car.  After surgery it may be brought to your room.  For patients admitted to the hospital, discharge time will be determined by your treatment team.  Patients discharged the day of surgery will not be allowed to drive home.   Name and phone number of your driver:    Special instructions:  Review handout preparing for surgery  Please read over the following fact sheets that you were given. Pain Booklet, Coughing and Deep Breathing, Blood Transfusion Information, MRSA Information and Surgical Site Infection Prevention

## 2015-10-31 ENCOUNTER — Encounter (HOSPITAL_COMMUNITY)
Admission: RE | Admit: 2015-10-31 | Discharge: 2015-10-31 | Disposition: A | Discharge status: Home / Self Care | Payer: BLUE CROSS/BLUE SHIELD | Source: Ambulatory Visit | Attending: Orthopedic Surgery | Admitting: Orthopedic Surgery

## 2015-10-31 ENCOUNTER — Encounter (HOSPITAL_COMMUNITY): Payer: Self-pay

## 2015-10-31 DIAGNOSIS — M1711 Unilateral primary osteoarthritis, right knee: Secondary | ICD-10-CM | POA: Diagnosis not present

## 2015-10-31 DIAGNOSIS — Z01812 Encounter for preprocedural laboratory examination: Secondary | ICD-10-CM | POA: Insufficient documentation

## 2015-10-31 DIAGNOSIS — Z0183 Encounter for blood typing: Secondary | ICD-10-CM | POA: Diagnosis not present

## 2015-10-31 LAB — BASIC METABOLIC PANEL
ANION GAP: 10 (ref 5–15)
BUN: 9 mg/dL (ref 6–20)
CALCIUM: 9.6 mg/dL (ref 8.9–10.3)
CO2: 26 mmol/L (ref 22–32)
CREATININE: 0.87 mg/dL (ref 0.44–1.00)
Chloride: 108 mmol/L (ref 101–111)
GFR calc Af Amer: >60 mL/min (ref >60)
GLUCOSE: 117 mg/dL — AB (ref 65–99)
Potassium: 3.7 mmol/L (ref 3.5–5.1)
Sodium: 144 mmol/L (ref 135–145)

## 2015-10-31 LAB — URINALYSIS, ROUTINE W REFLEX MICROSCOPIC
BILIRUBIN URINE: NEGATIVE
Glucose, UA: NEGATIVE mg/dL
Hgb urine dipstick: NEGATIVE
KETONES UR: NEGATIVE mg/dL
LEUKOCYTES UA: NEGATIVE
NITRITE: NEGATIVE
PH: 7.5 (ref 5.0–8.0)
PROTEIN: NEGATIVE mg/dL
Specific Gravity, Urine: 1.009 (ref 1.005–1.030)

## 2015-10-31 LAB — GLUCOSE, CAPILLARY: GLUCOSE-CAPILLARY: 119 mg/dL — AB (ref 65–99)

## 2015-10-31 LAB — CBC WITH DIFFERENTIAL/PLATELET
Basophils Absolute: 0.1 10*3/uL (ref 0.0–0.1)
Basophils Relative: 1 %
EOS ABS: 0.3 10*3/uL (ref 0.0–0.7)
Eosinophils Relative: 3 %
HCT: 42.8 % (ref 36.0–46.0)
HEMOGLOBIN: 14 g/dL (ref 12.0–15.0)
Lymphocytes Relative: 25 %
Lymphs Abs: 1.9 10*3/uL (ref 0.7–4.0)
MCH: 29.4 pg (ref 26.0–34.0)
MCHC: 32.7 g/dL (ref 30.0–36.0)
MCV: 89.9 fL (ref 78.0–100.0)
MONOS PCT: 6 %
Monocytes Absolute: 0.4 10*3/uL (ref 0.1–1.0)
Neutro Abs: 5 10*3/uL (ref 1.7–7.7)
Neutrophils Relative %: 65 %
Platelets: 194 10*3/uL (ref 150–400)
RBC: 4.76 MIL/uL (ref 3.87–5.11)
RDW: 13.9 % (ref 11.5–15.5)
WBC: 7.6 10*3/uL (ref 4.0–10.5)

## 2015-10-31 LAB — SURGICAL PCR SCREEN
MRSA, PCR: NEGATIVE
STAPHYLOCOCCUS AUREUS: NEGATIVE

## 2015-10-31 LAB — TYPE AND SCREEN
ABO/RH(D): O POS
ANTIBODY SCREEN: NEGATIVE

## 2015-10-31 LAB — APTT: APTT: 28 s (ref 24–37)

## 2015-10-31 LAB — PROTIME-INR
INR: 1.11 (ref 0.00–1.49)
PROTHROMBIN TIME: 14.5 s (ref 11.6–15.2)

## 2015-11-01 LAB — HEMOGLOBIN A1C
HEMOGLOBIN A1C: 6.7 % — AB (ref 4.8–5.6)
MEAN PLASMA GLUCOSE: 146 mg/dL

## 2015-11-05 NOTE — H&P (Signed)
TOTAL KNEE ADMISSION H&P  Patient is being admitted for right total knee arthroplasty.  Subjective:  Chief Complaint:right knee pain.  HPI: Wanda [name], 50 y.o. female, has a history of pain and functional disability in the right knee due to arthritis and has failed non-surgical conservative treatments for greater than 12 weeks to includeNSAID's and/or analgesics, corticosteriod injections, flexibility and strengthening excercises, use of assistive devices, weight reduction as appropriate and activity modification.  Onset of symptoms was gradual, starting 2 years ago with gradually worsening course since that time. The patient noted no past surgery on the right knee(s).  Patient currently rates pain in the right knee(s) at 10 out of 10 with activity. Patient has night pain, worsening of pain with activity and weight bearing, pain that interferes with activities of daily living, pain with passive range of motion and joint swelling.  Patient has evidence of periarticular osteophytes and joint space narrowing by imaging studies.  There is no active infection.  Patient Active Problem List   Diagnosis Date Noted  . Arthritis of knee 12/31/2014  . Primary osteoarthritis of left knee 10/12/2014  . Lumbar spinal stenosis 04/10/2014  . 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 Cervical disc disorder with radiculopathy of cervical region 05/03/2013  . Weakness of left side of body 10/28/2012  . Complicated migraine [id number]  . Disturbance of skin sensation 06/02/2012  . CVA (cerebral infarction)? vs Atypical Migraine 10/28/2011  . Sinusitis 04/10/2011  . Borderline blood pressure 04/10/2011  . CHRONIC RHINITIS 04/28/2010  . Lumbar L5 diskektomy 02/17/2010  . GERD 08/20/2009  . ABDOMINAL PAIN, UNSPECIFIED 08/20/2009  . HYPERCHOLESTEROLEMIA 12/26/2007  . OBESITY 12/07/2007  . DEPRESSION 12/07/2007  . URI, ACUTE 12/07/2007  . TMJ PAIN 12/07/2007  . FATIGUE 12/07/2007  . HEADACHE,  CHRONIC 12/07/2007  . TOBACCO ABUSE, HX OF 12/07/2007   Past Medical History  Diagnosis Date  . Depression   . Hypercholesterolemia     no current med.  . Anxiety   . History of blood transfusion   . Stroke ([place]) 10/27/2012    TIA   1- 10/29/11-weakness left side  . PONV (postoperative nausea and vomiting)     states severe  . Migraines   . History of TIA (transient ischemic attack)     x 2; left-sided weakness  . GERD (gastroesophageal reflux disease)   . Hypertension     states under control with meds., has been on med. since 06/2011  . Seasonal allergies   . Chronic bronchitis ([place])   . Family history of adverse reaction to anesthesia     pt's mother has hx. of post-op N/V  . Acne   . Osteoarthritis   . Keloid [id number]    umbilical  . Anemia     takes iron supplement  . Asthma     Past Surgical History  Procedure Laterality Date  . Vaginal hysterectomy      for DUB in 10/2003; partial  . Knee arthroscopy Left 02/2000  . Total knee arthroplasty Left 12/31/2014    Procedure: TOTAL LEFT KNEE ARTHROPLASTY;  Surgeon: Frederik Pear, MD;  Location: [facility];  Service: Orthopedics;  Laterality: Left;  . Diagnostic laparoscopy  early 2005  . Cholecystectomy  08/27/2009  . Laminotomy Left 03/27/2010    L5-S1; microdissection  . Lumbar fusion Left 07/18/2010    L4-5; L5-S1  . Carpal tunnel release Left 12/2013  . Carpal tunnel release Right 03/29/2015  . Functional endoscopic sinus surgery  04/30/2006  . Ethmoidectomy Bilateral  04/30/2006  . Nasal turbinate reduction  04/30/2006    inferior  . Total abdominal hysterectomy  11/01/2003    with lysis of adhesions; ovaries retained  . Laminotomy Bilateral 07/12/2007    L4-5  . Hysteroscopy w/d&c  04/21/2002    with lysis of adhesions  . Lumbar fusion  04/10/2014    L3-4, L4-5, L5-S1    No prescriptions prior to admission   Allergies  Allergen Reactions  . Lisinopril Swelling and Other (See Comments)    SWELLING FACE/LIPS/NECK  . Topamax  [Topiramate] Other (See Comments)    SHORT-TERM MEMORY LOSS  . Latex Rash  . Oxycodone-Acetaminophen Itching    Social History  Substance Use Topics  . Smoking status: Former Smoker -- 0.00 packs/day    Quit date: 07/20/2004  . Smokeless tobacco: Never Used  . Alcohol Use: No    Family History  Problem Relation Age of Onset  . Cervical cancer Maternal Grandmother   . Cancer Maternal Grandmother     mouth  . Heart attack Maternal Grandmother 55  . Hypertension Brother   . Heart failure Brother     ICD  . Anesthesia problems Mother     post-op N/V     Review of Systems  Constitutional: Positive for malaise/fatigue and diaphoresis.  HENT:       Sinus Problems  Respiratory: Positive for shortness of breath.   Cardiovascular:       HTN  Gastrointestinal: Positive for nausea.  Musculoskeletal: Positive for joint pain.  Neurological: Positive for dizziness and headaches.  Endo/Heme/Allergies:       Hot flashes  Psychiatric/Behavioral: Positive for depression and memory loss. The patient is nervous/anxious and has insomnia.     Objective:  Physical Exam  Constitutional: She is oriented to person, place, and time. She appears well-developed and well-nourished.  HENT:  Head: Normocephalic and atraumatic.  Eyes: Pupils are equal, round, and reactive to light.  Neck: Normal range of motion. Neck supple.  Cardiovascular: Intact distal pulses.   Respiratory: Effort normal.  Musculoskeletal: She exhibits tenderness.  the patient's right knee has a range from 5 to 100 on the right.  She has tenderness with palpation over the medial and lateral joint lines.  No instability.  Her calves are soft and nontender.  Patient's left knee has good strength and good range of motion and she has a range from 0-110.  Neurological: She is alert and oriented to person, place, and time.  Skin: Skin is warm and dry.  Psychiatric: She has a normal mood and affect. Her behavior is normal.  Judgment and thought content normal.    Vital signs in last 24 hours:    Labs:   Estimated body mass index is 39.72 kg/(m^2) as calculated from the following:   Height as of 04/24/15: 5\' 6"  (1.676 m).   Weight as of 09/17/15: 111.585 kg (246 lb).   Imaging Review Plain radiographs demonstrate lateral compartment arthritis of the right knee with periarticular osteophyte formation.  Assessment/Plan:  End stage arthritis, right knee   The patient history, physical examination, clinical judgment of the provider and imaging studies are consistent with end stage degenerative joint disease of the right knee(s) and total knee arthroplasty is deemed medically necessary. The treatment options including medical management, injection therapy arthroscopy and arthroplasty were discussed at length. The risks and benefits of total knee arthroplasty were presented and reviewed. The risks due to aseptic loosening, infection, stiffness, patella tracking problems, thromboembolic complications and other imponderables  were discussed. The patient acknowledged the explanation, agreed to proceed with the plan and consent was signed. Patient is being admitted for inpatient treatment for surgery, pain control, PT, OT, prophylactic antibiotics, VTE prophylaxis, progressive ambulation and ADL's and discharge planning. The patient is planning to be discharged home with home health services

## 2015-11-08 MED ORDER — DEXTROSE-NACL 5-0.45 % IV SOLN: INTRAVENOUS | Status: DC

## 2015-11-08 MED ORDER — CEFAZOLIN SODIUM 10 G IJ SOLR
3.0000 g | INTRAMUSCULAR | Status: AC
  Administered 2015-11-11: 3 g via INTRAVENOUS
  Filled 2015-11-08: qty 3000

## 2015-11-10 DIAGNOSIS — M1711 Unilateral primary osteoarthritis, right knee: Secondary | ICD-10-CM | POA: Diagnosis present

## 2015-11-10 NOTE — Anesthesia Preprocedure Evaluation (Addendum)
Anesthesia Evaluation  Patient identified by MRN, date of birth, ID band Patient awake    Reviewed: Allergy & Precautions, NPO status , Patient's Chart, lab work & pertinent test results  History of Anesthesia Complications (+) PONV and history of anesthetic complications  Airway Mallampati: II  TM Distance: >3 FB Neck ROM: Full    Dental  (+) Teeth Intact, Dental Advisory Given   Pulmonary asthma , former smoker,    Pulmonary exam normal        Cardiovascular hypertension, Pt. on medications and Pt. on home beta blockers Normal cardiovascular exam  '14 ECHO: EF 60-65%, valves OK   Neuro/Psych  Headaches, PSYCHIATRIC DISORDERS Anxiety Depression Chronic back pain: s/p fusion TIACVA    GI/Hepatic Neg liver ROS, GERD  Medicated,  Endo/Other  negative endocrine ROS  Renal/GU negative Renal ROS     Musculoskeletal  (+) Arthritis , Osteoarthritis,    Abdominal   Peds  Hematology negative hematology ROS (+)   Anesthesia Other Findings   Reproductive/Obstetrics                            Lab Results  Component Value Date   WBC 7.6 10/31/2015   HGB 14.0 10/31/2015   HCT 42.8 10/31/2015   MCV 89.9 10/31/2015   PLT 194 10/31/2015   Lab Results  Component Value Date   CREATININE 0.87 10/31/2015   BUN 9 10/31/2015   NA 144 10/31/2015   K 3.7 10/31/2015   CL 108 10/31/2015   CO2 26 10/31/2015   Lab Results  Component Value Date   INR 1.11 10/31/2015   INR 1.14 10/03/2015   INR 1.14 06/02/2015    Anesthesia Physical  Anesthesia Plan  ASA: III  Anesthesia Plan: General and Regional   Post-op Pain Management: GA combined w/ Regional for post-op pain   Induction: Intravenous  Airway Management Planned: Oral ETT  Additional Equipment:   Intra-op Plan:   Post-operative Plan: Extubation in OR  Informed Consent: I have reviewed the patients History and Physical, chart, labs  and discussed the procedure including the risks, benefits and alternatives for the proposed anesthesia with the patient or authorized representative who has indicated his/her understanding and acceptance.   Dental advisory given  Plan Discussed with: CRNA, Anesthesiologist and Surgeon  Anesthesia Plan Comments:         Anesthesia Quick Evaluation

## 2015-11-11 ENCOUNTER — Encounter (HOSPITAL_COMMUNITY)
Admission: RE | Disposition: A | Discharge status: Home / Self Care | Payer: Self-pay | Source: Ambulatory Visit | Attending: Orthopedic Surgery

## 2015-11-11 ENCOUNTER — Encounter (HOSPITAL_COMMUNITY): Payer: Self-pay | Admitting: Certified Registered Nurse Anesthetist

## 2015-11-11 ENCOUNTER — Inpatient Hospital Stay (HOSPITAL_COMMUNITY)
Admission: RE | Admit: 2015-11-11 | Discharge: 2015-11-14 | DRG: 470 | Disposition: A | Discharge status: Home / Self Care | Payer: BLUE CROSS/BLUE SHIELD | Source: Ambulatory Visit | Attending: Orthopedic Surgery | Admitting: Orthopedic Surgery

## 2015-11-11 ENCOUNTER — Inpatient Hospital Stay (HOSPITAL_COMMUNITY): Payer: BLUE CROSS/BLUE SHIELD | Admitting: Anesthesiology

## 2015-11-11 DIAGNOSIS — K219 Gastro-esophageal reflux disease without esophagitis: Secondary | ICD-10-CM | POA: Diagnosis present

## 2015-11-11 DIAGNOSIS — G43109 Migraine with aura, not intractable, without status migrainosus: Secondary | ICD-10-CM | POA: Diagnosis present

## 2015-11-11 DIAGNOSIS — E7800 Pure hypercholesterolemia, unspecified: Secondary | ICD-10-CM | POA: Diagnosis present

## 2015-11-11 DIAGNOSIS — Z91040 Latex allergy status: Secondary | ICD-10-CM | POA: Diagnosis not present

## 2015-11-11 DIAGNOSIS — I10 Essential (primary) hypertension: Secondary | ICD-10-CM | POA: Diagnosis present

## 2015-11-11 DIAGNOSIS — F419 Anxiety disorder, unspecified: Secondary | ICD-10-CM | POA: Diagnosis present

## 2015-11-11 DIAGNOSIS — Z8049 Family history of malignant neoplasm of other genital organs: Secondary | ICD-10-CM

## 2015-11-11 DIAGNOSIS — Z981 Arthrodesis status: Secondary | ICD-10-CM | POA: Diagnosis not present

## 2015-11-11 DIAGNOSIS — Z87891 Personal history of nicotine dependence: Secondary | ICD-10-CM

## 2015-11-11 DIAGNOSIS — Z6839 Body mass index (BMI) 39.0-39.9, adult: Secondary | ICD-10-CM | POA: Diagnosis not present

## 2015-11-11 DIAGNOSIS — Z8249 Family history of ischemic heart disease and other diseases of the circulatory system: Secondary | ICD-10-CM | POA: Diagnosis not present

## 2015-11-11 DIAGNOSIS — E669 Obesity, unspecified: Secondary | ICD-10-CM | POA: Diagnosis present

## 2015-11-11 DIAGNOSIS — M1711 Unilateral primary osteoarthritis, right knee: Secondary | ICD-10-CM | POA: Diagnosis present

## 2015-11-11 DIAGNOSIS — Z8673 Personal history of transient ischemic attack (TIA), and cerebral infarction without residual deficits: Secondary | ICD-10-CM | POA: Diagnosis not present

## 2015-11-11 DIAGNOSIS — Z96652 Presence of left artificial knee joint: Secondary | ICD-10-CM | POA: Diagnosis present

## 2015-11-11 DIAGNOSIS — Z888 Allergy status to other drugs, medicaments and biological substances status: Secondary | ICD-10-CM | POA: Diagnosis not present

## 2015-11-11 DIAGNOSIS — M25561 Pain in right knee: Secondary | ICD-10-CM | POA: Diagnosis present

## 2015-11-11 DIAGNOSIS — F329 Major depressive disorder, single episode, unspecified: Secondary | ICD-10-CM | POA: Diagnosis present

## 2015-11-11 DIAGNOSIS — M79661 Pain in right lower leg: Secondary | ICD-10-CM | POA: Diagnosis not present

## 2015-11-11 DIAGNOSIS — J302 Other seasonal allergic rhinitis: Secondary | ICD-10-CM | POA: Diagnosis present

## 2015-11-11 HISTORY — PX: TOTAL KNEE ARTHROPLASTY: SHX125

## 2015-11-11 LAB — GLUCOSE, CAPILLARY: GLUCOSE-CAPILLARY: 123 mg/dL — AB (ref 65–99)

## 2015-11-11 SURGERY — ARTHROPLASTY, KNEE, TOTAL
Anesthesia: Regional | Laterality: Right

## 2015-11-11 MED ORDER — BUPIVACAINE HCL (PF) 0.5 % IJ SOLN
INTRAMUSCULAR | Status: AC
  Filled 2015-11-11: qty 30

## 2015-11-11 MED ORDER — MENTHOL 3 MG MT LOZG: 1.0000 | LOZENGE | OROMUCOSAL | Status: DC | PRN

## 2015-11-11 MED ORDER — PHENOL 1.4 % MT LIQD: 1.0000 | OROMUCOSAL | Status: DC | PRN

## 2015-11-11 MED ORDER — BUPIVACAINE HCL 0.5 % IJ SOLN
INTRAMUSCULAR | Status: DC | PRN
  Administered 2015-11-11: 30 mL

## 2015-11-11 MED ORDER — ROCURONIUM BROMIDE 50 MG/5ML IV SOLN
INTRAVENOUS | Status: AC
  Filled 2015-11-11: qty 1

## 2015-11-11 MED ORDER — LIDOCAINE HCL (CARDIAC) 20 MG/ML IV SOLN
INTRAVENOUS | Status: AC
  Filled 2015-11-11: qty 5

## 2015-11-11 MED ORDER — GABAPENTIN 300 MG PO CAPS
600.0000 mg | ORAL_CAPSULE | Freq: Every day | ORAL | Status: DC
  Administered 2015-11-11 – 2015-11-13 (×3): 600 mg via ORAL
  Filled 2015-11-11 (×4): qty 2

## 2015-11-11 MED ORDER — CEFUROXIME SODIUM 1.5 G IJ SOLR
INTRAMUSCULAR | Status: DC | PRN
  Administered 2015-11-11: 1.5 g

## 2015-11-11 MED ORDER — METHOCARBAMOL 1000 MG/10ML IJ SOLN
500.0000 mg | Freq: Four times a day (QID) | INTRAVENOUS | Status: DC | PRN
  Filled 2015-11-11: qty 5

## 2015-11-11 MED ORDER — METHOCARBAMOL 500 MG PO TABS
500.0000 mg | ORAL_TABLET | Freq: Four times a day (QID) | ORAL | Status: DC | PRN
  Administered 2015-11-12 – 2015-11-14 (×4): 500 mg via ORAL
  Filled 2015-11-11 (×4): qty 1

## 2015-11-11 MED ORDER — TRANEXAMIC ACID 1000 MG/10ML IV SOLN: 2000.0000 mg | Freq: Once | INTRAVENOUS | Status: DC

## 2015-11-11 MED ORDER — EPHEDRINE SULFATE 50 MG/ML IJ SOLN
INTRAMUSCULAR | Status: AC
  Filled 2015-11-11: qty 1

## 2015-11-11 MED ORDER — FLEET ENEMA 7-19 GM/118ML RE ENEM: 1.0000 | ENEMA | Freq: Once | RECTAL | Status: DC | PRN

## 2015-11-11 MED ORDER — SUCCINYLCHOLINE CHLORIDE 20 MG/ML IJ SOLN
INTRAMUSCULAR | Status: AC
  Filled 2015-11-11: qty 1

## 2015-11-11 MED ORDER — SERTRALINE HCL 100 MG PO TABS
100.0000 mg | ORAL_TABLET | Freq: Every day | ORAL | Status: DC
  Administered 2015-11-12 – 2015-11-14 (×3): 100 mg via ORAL
  Filled 2015-11-11 (×3): qty 1

## 2015-11-11 MED ORDER — EPHEDRINE SULFATE 50 MG/ML IJ SOLN
INTRAMUSCULAR | Status: DC | PRN
  Administered 2015-11-11 (×5): 5 mg via INTRAVENOUS
  Administered 2015-11-11: 10 mg via INTRAVENOUS

## 2015-11-11 MED ORDER — FENTANYL CITRATE (PF) 100 MCG/2ML IJ SOLN
INTRAMUSCULAR | Status: DC | PRN
  Administered 2015-11-11 (×4): 50 ug via INTRAVENOUS

## 2015-11-11 MED ORDER — DOCUSATE SODIUM 100 MG PO CAPS
100.0000 mg | ORAL_CAPSULE | Freq: Two times a day (BID) | ORAL | Status: DC
  Administered 2015-11-11 – 2015-11-14 (×6): 100 mg via ORAL
  Filled 2015-11-11 (×6): qty 1

## 2015-11-11 MED ORDER — LACTATED RINGERS IV SOLN
INTRAVENOUS | Status: DC | PRN
  Administered 2015-11-11 (×2): via INTRAVENOUS

## 2015-11-11 MED ORDER — HYDROMORPHONE HCL 1 MG/ML IJ SOLN
0.2500 mg | INTRAMUSCULAR | Status: DC | PRN
  Administered 2015-11-11 (×2): 0.5 mg via INTRAVENOUS

## 2015-11-11 MED ORDER — LIDOCAINE HCL (CARDIAC) 20 MG/ML IV SOLN
INTRAVENOUS | Status: DC | PRN
  Administered 2015-11-11: 20 mg via INTRAVENOUS

## 2015-11-11 MED ORDER — SERTRALINE HCL 100 MG PO TABS: 100.0000 mg | ORAL_TABLET | Freq: Two times a day (BID) | ORAL | Status: DC

## 2015-11-11 MED ORDER — ONDANSETRON HCL 4 MG/2ML IJ SOLN: 4.0000 mg | Freq: Four times a day (QID) | INTRAMUSCULAR | Status: DC | PRN

## 2015-11-11 MED ORDER — FLUTICASONE PROPIONATE 50 MCG/ACT NA SUSP
2.0000 | Freq: Every day | NASAL | Status: DC
  Administered 2015-11-11 – 2015-11-14 (×4): 2 via NASAL
  Filled 2015-11-11: qty 16

## 2015-11-11 MED ORDER — ALBUTEROL SULFATE HFA 108 (90 BASE) MCG/ACT IN AERS: 1.0000 | INHALATION_SPRAY | Freq: Four times a day (QID) | RESPIRATORY_TRACT | Status: DC | PRN

## 2015-11-11 MED ORDER — MOMETASONE FURO-FORMOTEROL FUM 200-5 MCG/ACT IN AERO
2.0000 | INHALATION_SPRAY | Freq: Two times a day (BID) | RESPIRATORY_TRACT | Status: DC
  Administered 2015-11-11 – 2015-11-14 (×3): 2 via RESPIRATORY_TRACT
  Filled 2015-11-11 (×2): qty 8.8

## 2015-11-11 MED ORDER — LORATADINE 10 MG PO TABS
10.0000 mg | ORAL_TABLET | Freq: Every day | ORAL | Status: DC
  Administered 2015-11-12 – 2015-11-14 (×3): 10 mg via ORAL
  Filled 2015-11-11 (×3): qty 1

## 2015-11-11 MED ORDER — TRANEXAMIC ACID 1000 MG/10ML IV SOLN
2000.0000 mg | INTRAVENOUS | Status: DC | PRN
  Administered 2015-11-11: 2000 mg via TOPICAL

## 2015-11-11 MED ORDER — KCL IN DEXTROSE-NACL 20-5-0.45 MEQ/L-%-% IV SOLN
INTRAVENOUS | Status: DC
  Administered 2015-11-11: 12:00:00 via INTRAVENOUS
  Filled 2015-11-11: qty 1000

## 2015-11-11 MED ORDER — KETOROLAC TROMETHAMINE 30 MG/ML IJ SOLN
INTRAMUSCULAR | Status: AC
  Filled 2015-11-11: qty 1

## 2015-11-11 MED ORDER — KETOROLAC TROMETHAMINE 30 MG/ML IJ SOLN
30.0000 mg | Freq: Once | INTRAMUSCULAR | Status: AC
  Administered 2015-11-11: 30 mg via INTRAVENOUS

## 2015-11-11 MED ORDER — BUPIVACAINE LIPOSOME 1.3 % IJ SUSP
INTRAMUSCULAR | Status: DC | PRN
  Administered 2015-11-11: 20 mL

## 2015-11-11 MED ORDER — ZONISAMIDE 100 MG PO CAPS
200.0000 mg | ORAL_CAPSULE | Freq: Two times a day (BID) | ORAL | Status: DC
  Administered 2015-11-11 – 2015-11-14 (×6): 200 mg via ORAL
  Filled 2015-11-11 (×6): qty 2

## 2015-11-11 MED ORDER — MUPIROCIN CALCIUM 2 % EX CREA: 1.0000 "application " | TOPICAL_CREAM | Freq: Three times a day (TID) | CUTANEOUS | Status: DC

## 2015-11-11 MED ORDER — SUCCINYLCHOLINE CHLORIDE 20 MG/ML IJ SOLN
INTRAMUSCULAR | Status: DC | PRN
  Administered 2015-11-11: 120 mg via INTRAVENOUS

## 2015-11-11 MED ORDER — SCOPOLAMINE 1 MG/3DAYS TD PT72
MEDICATED_PATCH | TRANSDERMAL | Status: AC
  Administered 2015-11-11: 1 via TRANSDERMAL
  Filled 2015-11-11: qty 1

## 2015-11-11 MED ORDER — DIPHENHYDRAMINE HCL 12.5 MG/5ML PO ELIX: 12.5000 mg | ORAL_SOLUTION | ORAL | Status: DC | PRN

## 2015-11-11 MED ORDER — HYDROCODONE-ACETAMINOPHEN 5-325 MG PO TABS
1.0000 | ORAL_TABLET | ORAL | Status: DC | PRN
  Administered 2015-11-12 – 2015-11-14 (×7): 2 via ORAL
  Filled 2015-11-11 (×7): qty 2

## 2015-11-11 MED ORDER — ACETAMINOPHEN 325 MG PO TABS
650.0000 mg | ORAL_TABLET | Freq: Four times a day (QID) | ORAL | Status: DC | PRN
Start: 2015-11-11 — End: 2015-11-14

## 2015-11-11 MED ORDER — CEFAZOLIN SODIUM-DEXTROSE 2-4 GM/100ML-% IV SOLN
INTRAVENOUS | Status: AC
  Filled 2015-11-11: qty 100

## 2015-11-11 MED ORDER — PROPOFOL 10 MG/ML IV BOLUS
INTRAVENOUS | Status: AC
  Filled 2015-11-11: qty 20

## 2015-11-11 MED ORDER — METOCLOPRAMIDE HCL 5 MG/ML IJ SOLN: 5.0000 mg | Freq: Three times a day (TID) | INTRAMUSCULAR | Status: DC | PRN

## 2015-11-11 MED ORDER — METHOCARBAMOL 500 MG PO TABS: 500.0000 mg | ORAL_TABLET | Freq: Two times a day (BID) | ORAL | Status: DC

## 2015-11-11 MED ORDER — SERTRALINE HCL 100 MG PO TABS
200.0000 mg | ORAL_TABLET | Freq: Every day | ORAL | Status: DC
  Administered 2015-11-11 – 2015-11-13 (×3): 200 mg via ORAL
  Filled 2015-11-11 (×3): qty 2

## 2015-11-11 MED ORDER — CHLORHEXIDINE GLUCONATE 4 % EX LIQD: 60.0000 mL | Freq: Once | CUTANEOUS | Status: DC

## 2015-11-11 MED ORDER — HYDROMORPHONE HCL 2 MG PO TABS
2.0000 mg | ORAL_TABLET | ORAL | Status: DC | PRN
  Administered 2015-11-11 – 2015-11-12 (×2): 2 mg via ORAL
  Filled 2015-11-11 (×2): qty 1

## 2015-11-11 MED ORDER — ACETAMINOPHEN 650 MG RE SUPP: 650.0000 mg | Freq: Four times a day (QID) | RECTAL | Status: DC | PRN

## 2015-11-11 MED ORDER — PROCHLORPERAZINE EDISYLATE 5 MG/ML IJ SOLN
10.0000 mg | Freq: Once | INTRAMUSCULAR | Status: DC | PRN
Start: 2015-11-11 — End: 2015-11-11

## 2015-11-11 MED ORDER — ALUM & MAG HYDROXIDE-SIMETH 200-200-20 MG/5ML PO SUSP: 30.0000 mL | ORAL | Status: DC | PRN

## 2015-11-11 MED ORDER — HYDROMORPHONE HCL 2 MG PO TABS: 2.0000 mg | ORAL_TABLET | ORAL | Status: DC | PRN

## 2015-11-11 MED ORDER — ROPIVACAINE HCL 5 MG/ML IJ SOLN
INTRAMUSCULAR | Status: DC | PRN
  Administered 2015-11-11: 20 mL via PERINEURAL

## 2015-11-11 MED ORDER — HYDROMORPHONE HCL 1 MG/ML IJ SOLN
1.0000 mg | INTRAMUSCULAR | Status: DC | PRN
  Administered 2015-11-11 – 2015-11-12 (×4): 1 mg via INTRAVENOUS
  Filled 2015-11-11 (×4): qty 1

## 2015-11-11 MED ORDER — CEFUROXIME SODIUM 750 MG IJ SOLR
INTRAMUSCULAR | Status: AC
  Filled 2015-11-11: qty 750

## 2015-11-11 MED ORDER — DEXAMETHASONE SODIUM PHOSPHATE 4 MG/ML IJ SOLN
INTRAMUSCULAR | Status: DC | PRN
  Administered 2015-11-11: 4 mg via INTRAVENOUS

## 2015-11-11 MED ORDER — ASPIRIN EC 325 MG PO TBEC
325.0000 mg | DELAYED_RELEASE_TABLET | Freq: Every day | ORAL | Status: DC
  Administered 2015-11-12 – 2015-11-14 (×3): 325 mg via ORAL
  Filled 2015-11-11 (×3): qty 1

## 2015-11-11 MED ORDER — ONDANSETRON HCL 4 MG/2ML IJ SOLN
INTRAMUSCULAR | Status: AC
  Filled 2015-11-11: qty 2

## 2015-11-11 MED ORDER — FENTANYL CITRATE (PF) 250 MCG/5ML IJ SOLN
INTRAMUSCULAR | Status: AC
  Filled 2015-11-11: qty 5

## 2015-11-11 MED ORDER — HYDROMORPHONE HCL 1 MG/ML IJ SOLN
INTRAMUSCULAR | Status: AC
  Administered 2015-11-11: 0.5 mg via INTRAVENOUS
  Filled 2015-11-11: qty 1

## 2015-11-11 MED ORDER — MIDAZOLAM HCL 2 MG/2ML IJ SOLN
INTRAMUSCULAR | Status: AC
  Filled 2015-11-11: qty 2

## 2015-11-11 MED ORDER — MONTELUKAST SODIUM 10 MG PO TABS
10.0000 mg | ORAL_TABLET | Freq: Every day | ORAL | Status: DC
  Administered 2015-11-11 – 2015-11-13 (×3): 10 mg via ORAL
  Filled 2015-11-11 (×3): qty 1

## 2015-11-11 MED ORDER — ALBUTEROL SULFATE (2.5 MG/3ML) 0.083% IN NEBU: 2.5000 mg | INHALATION_SOLUTION | Freq: Four times a day (QID) | RESPIRATORY_TRACT | Status: DC | PRN

## 2015-11-11 MED ORDER — BUPIVACAINE LIPOSOME 1.3 % IJ SUSP
20.0000 mL | INTRAMUSCULAR | Status: DC
  Filled 2015-11-11: qty 20

## 2015-11-11 MED ORDER — BUSPIRONE HCL 5 MG PO TABS
15.0000 mg | ORAL_TABLET | Freq: Two times a day (BID) | ORAL | Status: DC
  Administered 2015-11-11 – 2015-11-14 (×6): 15 mg via ORAL
  Filled 2015-11-11 (×6): qty 1

## 2015-11-11 MED ORDER — PROPOFOL 10 MG/ML IV BOLUS
INTRAVENOUS | Status: DC | PRN
  Administered 2015-11-11: 200 mg via INTRAVENOUS
  Administered 2015-11-11: 40 mg via INTRAVENOUS

## 2015-11-11 MED ORDER — SODIUM CHLORIDE 0.9 % IR SOLN
Status: DC | PRN
  Administered 2015-11-11: 1000 mL

## 2015-11-11 MED ORDER — HYDROCHLOROTHIAZIDE 12.5 MG PO CAPS
12.5000 mg | ORAL_CAPSULE | Freq: Every day | ORAL | Status: DC
  Administered 2015-11-11 – 2015-11-14 (×4): 12.5 mg via ORAL
  Filled 2015-11-11 (×4): qty 1

## 2015-11-11 MED ORDER — PHENYLEPHRINE 40 MCG/ML (10ML) SYRINGE FOR IV PUSH (FOR BLOOD PRESSURE SUPPORT)
PREFILLED_SYRINGE | INTRAVENOUS | Status: AC
  Filled 2015-11-11: qty 10

## 2015-11-11 MED ORDER — ATENOLOL 50 MG PO TABS
50.0000 mg | ORAL_TABLET | Freq: Every day | ORAL | Status: DC
  Administered 2015-11-12 – 2015-11-14 (×3): 50 mg via ORAL
  Filled 2015-11-11 (×3): qty 1

## 2015-11-11 MED ORDER — BISACODYL 5 MG PO TBEC
5.0000 mg | DELAYED_RELEASE_TABLET | Freq: Every day | ORAL | Status: DC | PRN
  Administered 2015-11-14: 5 mg via ORAL
  Filled 2015-11-11: qty 1

## 2015-11-11 MED ORDER — CYPROHEPTADINE HCL 4 MG PO TABS
8.0000 mg | ORAL_TABLET | Freq: Two times a day (BID) | ORAL | Status: DC
  Administered 2015-11-11 – 2015-11-14 (×6): 8 mg via ORAL
  Filled 2015-11-11 (×6): qty 2

## 2015-11-11 MED ORDER — PANTOPRAZOLE SODIUM 40 MG PO TBEC
40.0000 mg | DELAYED_RELEASE_TABLET | Freq: Two times a day (BID) | ORAL | Status: DC
  Administered 2015-11-11 – 2015-11-14 (×6): 40 mg via ORAL
  Filled 2015-11-11 (×6): qty 1

## 2015-11-11 MED ORDER — TRANEXAMIC ACID 1000 MG/10ML IV SOLN
2000.0000 mg | Freq: Once | INTRAVENOUS | Status: DC
  Filled 2015-11-11: qty 20

## 2015-11-11 MED ORDER — ASPIRIN EC 325 MG PO TBEC: 325.0000 mg | DELAYED_RELEASE_TABLET | Freq: Two times a day (BID) | ORAL | Status: DC

## 2015-11-11 MED ORDER — SENNOSIDES-DOCUSATE SODIUM 8.6-50 MG PO TABS: 1.0000 | ORAL_TABLET | Freq: Every evening | ORAL | Status: DC | PRN

## 2015-11-11 MED ORDER — METOCLOPRAMIDE HCL 5 MG PO TABS: 5.0000 mg | ORAL_TABLET | Freq: Three times a day (TID) | ORAL | Status: DC | PRN

## 2015-11-11 MED ORDER — ONDANSETRON HCL 4 MG PO TABS: 4.0000 mg | ORAL_TABLET | Freq: Four times a day (QID) | ORAL | Status: DC | PRN

## 2015-11-11 MED ORDER — BUPIVACAINE LIPOSOME 1.3 % IJ SUSP: 20.0000 mL | Freq: Once | INTRAMUSCULAR | Status: DC

## 2015-11-11 MED ORDER — ONDANSETRON HCL 4 MG/2ML IJ SOLN
INTRAMUSCULAR | Status: DC | PRN
  Administered 2015-11-11: 4 mg via INTRAVENOUS

## 2015-11-11 MED ORDER — NON FORMULARY: 1.0000 | Freq: Two times a day (BID) | Status: DC

## 2015-11-11 SURGICAL SUPPLY — 52 items
BANDAGE ESMARK 6X9 LF (GAUZE/BANDAGES/DRESSINGS) ×1 IMPLANT
BLADE SAG 18X100X1.27 (BLADE) ×3 IMPLANT
BLADE SAW SGTL 13X75X1.27 (BLADE) ×3 IMPLANT
BLADE SURG ROTATE 9660 (MISCELLANEOUS) IMPLANT
BNDG ELASTIC 6X10 VLCR STRL LF (GAUZE/BANDAGES/DRESSINGS) ×3 IMPLANT
BNDG ESMARK 6X9 LF (GAUZE/BANDAGES/DRESSINGS) ×3
BOWL SMART MIX CTS (DISPOSABLE) ×3 IMPLANT
CAPT KNEE TOTAL 3 ATTUNE ×3 IMPLANT
CEMENT HV SMART SET (Cement) ×6 IMPLANT
COVER SURGICAL LIGHT HANDLE (MISCELLANEOUS) ×3 IMPLANT
CUFF TOURNIQUET SINGLE 34IN LL (TOURNIQUET CUFF) ×3 IMPLANT
CUFF TOURNIQUET SINGLE 44IN (TOURNIQUET CUFF) IMPLANT
DRAPE EXTREMITY T 121X128X90 (DRAPE) ×3 IMPLANT
DRAPE U-SHAPE 47X51 STRL (DRAPES) ×3 IMPLANT
DRSG AQUACEL AG ADV 3.5X10 (GAUZE/BANDAGES/DRESSINGS) ×3 IMPLANT
DURAPREP 26ML APPLICATOR (WOUND CARE) ×6 IMPLANT
ELECT REM PT RETURN 9FT ADLT (ELECTROSURGICAL) ×3
ELECTRODE REM PT RTRN 9FT ADLT (ELECTROSURGICAL) ×1 IMPLANT
EVACUATOR 1/8 PVC DRAIN (DRAIN) IMPLANT
GLOVE BIO SURGEON STRL SZ7.5 (GLOVE) ×3 IMPLANT
GLOVE BIO SURGEON STRL SZ8.5 (GLOVE) ×3 IMPLANT
GLOVE BIOGEL PI IND STRL 8 (GLOVE) ×1 IMPLANT
GLOVE BIOGEL PI IND STRL 9 (GLOVE) ×1 IMPLANT
GLOVE BIOGEL PI INDICATOR 8 (GLOVE) ×2
GLOVE BIOGEL PI INDICATOR 9 (GLOVE) ×2
GOWN STRL REUS W/ TWL LRG LVL3 (GOWN DISPOSABLE) ×1 IMPLANT
GOWN STRL REUS W/ TWL XL LVL3 (GOWN DISPOSABLE) ×2 IMPLANT
GOWN STRL REUS W/TWL LRG LVL3 (GOWN DISPOSABLE) ×2
GOWN STRL REUS W/TWL XL LVL3 (GOWN DISPOSABLE) ×4
HANDPIECE INTERPULSE COAX TIP (DISPOSABLE) ×2
HOOD PEEL AWAY FACE SHEILD DIS (HOOD) ×6 IMPLANT
KIT BASIN OR (CUSTOM PROCEDURE TRAY) ×3 IMPLANT
KIT ROOM TURNOVER OR (KITS) ×3 IMPLANT
MANIFOLD NEPTUNE II (INSTRUMENTS) ×3 IMPLANT
NEEDLE SPNL 18GX3.5 QUINCKE PK (NEEDLE) IMPLANT
NS IRRIG 1000ML POUR BTL (IV SOLUTION) ×3 IMPLANT
PACK TOTAL JOINT (CUSTOM PROCEDURE TRAY) ×3 IMPLANT
PAD ARMBOARD 7.5X6 YLW CONV (MISCELLANEOUS) ×6 IMPLANT
SET HNDPC FAN SPRY TIP SCT (DISPOSABLE) ×1 IMPLANT
SUT VIC AB 0 CT1 27 (SUTURE) ×2
SUT VIC AB 0 CT1 27XBRD ANBCTR (SUTURE) ×1 IMPLANT
SUT VIC AB 1 CTX 36 (SUTURE) ×2
SUT VIC AB 1 CTX36XBRD ANBCTR (SUTURE) ×1 IMPLANT
SUT VIC AB 2-0 CT1 27 (SUTURE)
SUT VIC AB 2-0 CT1 TAPERPNT 27 (SUTURE) IMPLANT
SUT VIC AB 3-0 CT1 27 (SUTURE) ×2
SUT VIC AB 3-0 CT1 TAPERPNT 27 (SUTURE) ×1 IMPLANT
SYR 50ML LL SCALE MARK (SYRINGE) ×3 IMPLANT
TOWEL OR 17X24 6PK STRL BLUE (TOWEL DISPOSABLE) ×3 IMPLANT
TOWEL OR 17X26 10 PK STRL BLUE (TOWEL DISPOSABLE) ×3 IMPLANT
TRAY CATH 16FR W/PLASTIC CATH (SET/KITS/TRAYS/PACK) IMPLANT
WATER STERILE IRR 1000ML POUR (IV SOLUTION) IMPLANT

## 2015-11-11 NOTE — Transfer of Care (Signed)
Immediate Anesthesia Transfer of Care Note  Patient: Wanda [name]  Procedure(s) Performed: Procedure(s): TOTAL KNEE ARTHROPLASTY (Right)  Patient Location: PACU  Anesthesia Type:GA combined with regional for post-op pain  Level of Consciousness: awake and sedated  Airway & Oxygen Therapy: Patient Spontanous Breathing and Patient connected to face mask oxygen  Post-op Assessment: Report given to RN and Post -op Vital signs reviewed and stable  Post vital signs: stable  Last Vitals:  Filed Vitals:   11/11/15 0643 11/11/15 0927  BP: 98/53 112/55  Pulse:  87  Temp:  36.9 C  Resp:  17    Complications: No apparent anesthesia complications

## 2015-11-11 NOTE — Evaluation (Signed)
Physical Therapy Evaluation Patient Details Name: Wanda [name] MRN: [id number] DOB: Jan 26, 1966 Today's Date: 11/11/2015   History of Present Illness  Pt is a 50 y/o F s/p Rt TKA.  Pt's PMH includes lumbar spinal stenosis, cervical disc disorder and radiculopathy of cervical region, weakness of Lt side of body, CVA(?) vs. atypical migraine, obesity, depression, anemia, Lt TKA, L5-S1 leminotomy and L4-5; L5-S1 lumbar fusion.      Clinical Impression  Pt is s/p Rt TKA resulting in the deficits listed below (see PT Problem List). Pt very lethargic as she received pain medication prior to evaluation.  She ambulated in room at min guard assist level and complete therapeutic exercises supine in bed.  She has arranged for her friends to take shifts staying w/ her at d/c when her daughter is not home. Pt will benefit from skilled PT to increase their independence and safety with mobility to allow discharge to the venue listed below.     Follow Up Recommendations Home health PT;Supervision for mobility/OOB    Equipment Recommendations  None recommended by PT    Recommendations for Other Services OT consult     Precautions / Restrictions Precautions Precautions: Fall;Knee Precaution Booklet Issued: Yes (comment) Precaution Comments: Reviewed importance of maintaining Rt knee extension Restrictions Weight Bearing Restrictions: Yes RLE Weight Bearing: Weight bearing as tolerated      Mobility  Bed Mobility Overal bed mobility: Needs Assistance Bed Mobility: Sit to Supine       Sit to supine: Min guard   General bed mobility comments: Increased time and effort  Transfers Overall transfer level: Needs assistance Equipment used: Rolling walker (2 wheeled) Transfers: Sit to/from Stand Sit to Stand: Min guard         General transfer comment: Pt w/ safe technique to stand, requires cues to reach back when sitting.    Ambulation/Gait Ambulation/Gait assistance: Min  guard Ambulation Distance (Feet): 30 Feet Assistive device: Rolling walker (2 wheeled) Gait Pattern/deviations: Step-to pattern;Decreased stride length;Decreased weight shift to right;Antalgic   Gait velocity interpretation: Below normal speed for age/gender General Gait Details: Decreased gait speed as pt lethargic and requires cues to focus on task.  Safe technique using RW.  SpO2 fluctuating between 89% and 93% on RA.  Stairs            Wheelchair Mobility    Modified Rankin (Stroke Patients Only)       Balance Overall balance assessment: Needs assistance Sitting-balance support: Bilateral upper extremity supported;Feet supported Sitting balance-Leahy Scale: Fair Sitting balance - Comments: Close min guard for safety as pt lethargic   Standing balance support: Bilateral upper extremity supported;During functional activity Standing balance-Leahy Scale: Poor Standing balance comment: Relies on UE support w/ static stance                             Pertinent Vitals/Pain Pain Assessment: Faces Faces Pain Scale: Hurts little more Pain Location: Rt TKA w/ heel slides Pain Descriptors / Indicators: Grimacing;Guarding Pain Intervention(s): Limited activity within patient's tolerance;Monitored during session;Repositioned;Premedicated before session    Home Living Family/patient expects to be discharged to:: Private residence Living Arrangements: Children Available Help at Discharge: Family;Friend(s);Available 24 hours/day Type of Home: Apartment Home Access: Level entry     Home Layout: One level Home Equipment: Walker - 2 wheels;Cane - single point;Bedside commode Additional Comments: Daughter is in school.  Pt has already arranged for friends to take shifts staying w/ her at  home at d/c.    Prior Function Level of Independence: Needs assistance   Gait / Transfers Assistance Needed: Cane at all times due to poor balance and pain.  Says she gets dizzy w/  tub transfer so she always has her daughter w/ her for supervision and assist at times.    ADL's / Homemaking Assistance Needed: Daughter assists pt w/ dressing (donning shirt, pulling up pants, and tying shoes).        Hand Dominance   Dominant Hand: Right    Extremity/Trunk Assessment   Upper Extremity Assessment: Defer to OT evaluation           Lower Extremity Assessment: RLE deficits/detail RLE Deficits / Details: limited ROM and strength as expected s/p Rt TKA       Communication   Communication: No difficulties  Cognition Arousal/Alertness: Lethargic;Suspect due to medications (Pt fell asleep at end of session (just received dilaudid)) Behavior During Therapy: [facility] for tasks assessed/performed Overall Cognitive Status: Within Functional Limits for tasks assessed                      General Comments General comments (skin integrity, edema, etc.): SpO2 as low as 86% on RA sitting in chair.  Educated on pursed lip breathing w/ improvement.    Exercises General Exercises - Lower Extremity Ankle Circles/Pumps: AROM;Both;10 reps;Supine Quad Sets: Strengthening;Both;10 reps;Supine Heel Slides: AROM;Right;5 reps;Supine      Assessment/Plan    PT Assessment Patient needs continued PT services  PT Diagnosis Difficulty walking;Abnormality of gait;Acute pain   PT Problem List Decreased strength;Decreased range of motion;Decreased activity tolerance;Decreased balance;Decreased mobility;Decreased knowledge of use of DME;Decreased safety awareness;Cardiopulmonary status limiting activity;Decreased knowledge of precautions;Pain  PT Treatment Interventions     PT Goals (Current goals can be found in the Care Plan section) Acute Rehab PT Goals Patient Stated Goal: to go home and to lose weight once her pain is gone PT Goal Formulation: With patient Time For Goal Achievement: 11/18/15 Potential to Achieve Goals: Good    Frequency 7X/week   Barriers to discharge         Co-evaluation               End of Session Equipment Utilized During Treatment: Gait belt Activity Tolerance: Patient limited by lethargy Patient left: in bed;with call bell/phone within reach;with bed alarm set Nurse Communication: Mobility status;Other (comment);Weight bearing status;Precautions (SpO2)         Time: [id number] PT Time Calculation (min) (ACUTE ONLY): 18 min   Charges:   PT Evaluation $PT Eval Moderate Complexity: 1 Procedure     PT G Codes:       Collie Siad PT, DPT  Pager: [phone] Phone: [phone] 11/11/2015, 5:03 PM

## 2015-11-11 NOTE — Anesthesia Procedure Notes (Addendum)
Anesthesia Regional Block:  Adductor canal block  Pre-Anesthetic Checklist: ,, timeout performed, Correct Patient, Correct Site, Correct Laterality, Correct Procedure, Correct Position, site marked, Risks and benefits discussed,  Surgical consent,  Pre-op evaluation,  At surgeon's request and post-op pain management  Laterality: Right  Prep: chloraprep       Needles:  Injection technique: Single-shot  Needle Type: Echogenic Needle     Needle Length: 9cm 9 cm Needle Gauge: 21 and 21 G    Additional Needles:  Procedures: ultrasound guided (picture in chart) Adductor canal block Narrative:  Start time: 11/11/2015 7:03 AM End time: 11/11/2015 7:10 AM Injection made incrementally with aspirations every 5 mL.  Performed by: Personally  Anesthesiologist: Suzette Battiest   Procedure Name: Intubation Date/Time: 11/11/2015 7:42 AM Performed by: Rejeana Brock L Pre-anesthesia Checklist: Patient identified, Timeout performed, Emergency Drugs available, Suction available and Patient being monitored Patient Re-evaluated:Patient Re-evaluated prior to inductionOxygen Delivery Method: Circle system utilized Preoxygenation: Pre-oxygenation with 100% oxygen Intubation Type: IV induction Ventilation: Mask ventilation without difficulty Laryngoscope Size: Mac and 3 Grade View: Grade II Tube type: Oral Tube size: 7.0 mm Number of attempts: 1 Airway Equipment and Method: Stylet Placement Confirmation: ETT inserted through vocal cords under direct vision,  positive ETCO2 and breath sounds checked- equal and bilateral Secured at: 22 cm Tube secured with: Tape Dental Injury: Teeth and Oropharynx as per pre-operative assessment

## 2015-11-11 NOTE — Op Note (Signed)
PATIENT ID:      Wanda [name]  MRN:     [id number] DOB/AGE:    [birth year] / 50 y.o.       OPERATIVE REPORT    DATE OF PROCEDURE:  11/11/2015       PREOPERATIVE DIAGNOSIS:   RIGHT KNEE OSTEOARTHRITIS      Estimated body mass index is 39.72 kg/(m^2) as calculated from the following:   Height as of 10/31/15: 5\' 6"  (1.676 m).   Weight as of 09/17/15: 111.585 kg (246 lb).                                                        POSTOPERATIVE DIAGNOSIS:   same                                                                      PROCEDURE:  Procedure(s): TOTAL KNEE ARTHROPLASTY Using DepuyAttune RP implants #6R Femur, #5Tibia, 5 mm Attune RP bearing, 38 Patella     SURGEON: Jamale Spangler J    ASSISTANT:   [employer]   (Present and scrubbed throughout the case, critical for assistance with exposure, retraction, instrumentation, and closure.)         ANESTHESIA: GET, 20cc Exparel, 20cc 0.5% Marcaine, adductor canal block  EBL: 200  FLUID REPLACEMENT: 1500 crystalloid  TOURNIQUET TIME: 69min  Drains: None  Tranexamic Acid: 2gm topical   COMPLICATIONS:  None         INDICATIONS FOR PROCEDURE: The patient has  RIGHT KNEE OSTEOARTHRITIS, Val deformities, XR shows bone on bone arthritis, lateral subluxation of tibia. Patient has failed all conservative measures including anti-inflammatory medicines, narcotics, attempts at  exercise and weight loss, cortisone injections and viscosupplementation.  Risks and benefits of surgery have been discussed, questions answered.   DESCRIPTION OF PROCEDURE: The patient identified by armband, received  IV antibiotics, in the holding area at [facility]. Patient taken to the operating room, appropriate anesthetic  monitors were attached, and GET anesthesia was  induced. Tourniquet  applied high to the operative thigh. Lateral post and foot positioner  applied to the table, the lower extremity was then prepped and draped  in usual sterile  fashion from the toes to the tourniquet. Time-out procedure was performed. We began the operation, with the knee flexed 120 degrees, by making the anterior midline incision starting at handbreadth above the patella going over the patella 1 cm medial to and 4 cm distal to the tibial tubercle. Small bleeders in the skin and the  subcutaneous tissue identified and cauterized. Transverse retinaculum was incised and reflected medially and a medial parapatellar arthrotomy was accomplished. the patella was everted and theprepatellar fat pad resected. The superficial medial collateral  ligament was then elevated from anterior to posterior along the proximal  flare of the tibia and anterior half of the menisci resected. The knee was hyperflexed exposing bone on bone arthritis. Peripheral and notch osteophytes as well as the cruciate ligaments were then resected. We continued to  work our way around posteriorly along the proximal tibia, and externally  rotated the tibia subluxing it out from underneath the femur. A McHale  retractor was placed through the notch and a lateral Hohmann retractor  placed, and we then drilled through the proximal tibia in line with the  axis of the tibia followed by an intramedullary guide rod and 2-degree  posterior slope cutting guide. The tibial cutting guide, 3 degree posterior sloped, was pinned into place allowing resection of 8 mm of bone medially and 4 mm of bone laterally. Satisfied with the tibial resection, we then  entered the distal femur 2 mm anterior to the PCL origin with the  intramedullary guide rod and applied the distal femoral cutting guide  set at 9 mm, with 5 degrees of valgus. This was pinned along the  epicondylar axis. At this point, the distal femoral cut was accomplished without difficulty. We then sized for a #6R femoral component and pinned the guide in 3 degrees of external rotation. The chamfer cutting guide was pinned into place. The anterior,  posterior, and chamfer cuts were accomplished without difficulty followed by  the Attune RP box cutting guide and the box cut. We also removed posterior osteophytes from the posterior femoral condyles. At this  time, the knee was brought into full extension. We checked our  extension and flexion gaps and found them symmetric for a 5 mm bearing. Distracting in extension with a lamina spreader, the posterior horns of the menisci were removed, and Exparel, diluted to 60 cc, with 20cc NS, and 20cc 0.5% Marcaine,was injected into the capsule and synovium of the knee. The posterior patella cut was accomplished with the 9.5 mm Attune cutting guide, sized for a 49mm dome, and the fixation pegs drilled.The knee  was then once again hyperflexed exposing the proximal tibia. We sized for a # 5 tibial base plate, applied the smokestack and the conical reamer followed by the the Delta fin keel punch. We then hammered into place the Attune RP trial femoral component, drilled the lugs, inserted a  5 mm trial bearing, trial patellar button, and took the knee through range of motion from 0-130 degrees. No thumb pressure was required for patellar Tracking. At this point, the limb was wrapped with an Esmarch bandage and the tourniquet inflated to 350 mmHg. All trial components were removed, mating surfaces irrigated with pulse lavage, and dried with suction and sponges. A double batch of DePuy HV cement with 1500 mg of Zinacef was mixed and applied to all bony metallic mating surfaces except for the posterior condyles of the femur itself. In order, we  hammered into place the tibial tray and removed excess cement, the femoral component and removed excess cement. The final Attune RP bearing  was inserted, and the knee brought to full extension with compression.  The patellar button was clamped into place, and excess cement  removed. While the cement cured the wound was irrigated out with normal saline solution pulse lavage.  Ligament stability and patellar tracking were checked and found to be excellent. The parapatellar arthrotomy was closed with  running #1 Vicryl suture. The subcutaneous tissue with 0 and 2-0 undyed  Vicryl suture, and the skin with running 3-0 SQ vicryl. A dressing of Xeroform,  4 x 4, dressing sponges, Webril, and Ace wrap applied. The patient  awakened, and taken to recovery room without difficulty.   Tarae Wooden J 11/11/2015, 9:04 AM

## 2015-11-11 NOTE — Progress Notes (Signed)
Orthopedic Tech Progress Note Patient Details:  Wanda [name] May 05, 1966 [id number]  CPM Right Knee CPM Right Knee: On Right Knee Flexion (Degrees): 40 Right Knee Extension (Degrees): 10 Additional Comments: Trapeze bar and foot roll   [place] Pink 11/11/2015, 10:46 AM

## 2015-11-11 NOTE — Progress Notes (Signed)
Patient transported to 5N05, no noted history of sleep apnea but pt snoring at intervals. Think she would do well with O2 saturation monitoring and relayed this to [facility] in report. Staff at desk on 5N requested to order oximetry for pt. Pt wearing eyeglasses on discharge from PACU.

## 2015-11-11 NOTE — Discharge Instructions (Signed)

## 2015-11-11 NOTE — Anesthesia Postprocedure Evaluation (Signed)
Anesthesia Post Note  Patient: Wanda [name]  Procedure(s) Performed: Procedure(s) (LRB): TOTAL KNEE ARTHROPLASTY (Right)  Patient location during evaluation: PACU Anesthesia Type: General and Regional Level of consciousness: awake and alert Pain management: pain level controlled Vital Signs Assessment: post-procedure vital signs reviewed and stable Respiratory status: spontaneous breathing, nonlabored ventilation, respiratory function stable and patient connected to nasal cannula oxygen Cardiovascular status: blood pressure returned to baseline and stable Postop Assessment: no signs of nausea or vomiting Anesthetic complications: no    Last Vitals:  Filed Vitals:   11/11/15 1100 11/11/15 1207  BP:  104/48  Pulse:  82  Temp: 36.6 C 36.6 C  Resp:  16    Last Pain:  Filed Vitals:   11/11/15 1208  PainSc: 0-No pain                 Tiajuana Amass

## 2015-11-11 NOTE — Interval H&P Note (Signed)
History and Physical Interval Note:  11/11/2015 7:09 AM  Wanda [name]  has presented today for surgery, with the diagnosis of RIGHT KNEE OSTEOARTHRITIS  The various methods of treatment have been discussed with the patient and family. After consideration of risks, benefits and other options for treatment, the patient has consented to  Procedure(s): TOTAL KNEE ARTHROPLASTY (Right) as a surgical intervention .  The patient's history has been reviewed, patient examined, no change in status, stable for surgery.  I have reviewed the patient's chart and labs.  Questions were answered to the patient's satisfaction.     Kerin Salen

## 2015-11-12 ENCOUNTER — Encounter (HOSPITAL_COMMUNITY): Payer: Self-pay | Admitting: Orthopedic Surgery

## 2015-11-12 LAB — BASIC METABOLIC PANEL
ANION GAP: 7 (ref 5–15)
BUN: 8 mg/dL (ref 6–20)
CALCIUM: 8.5 mg/dL — AB (ref 8.9–10.3)
CHLORIDE: 107 mmol/L (ref 101–111)
CO2: 27 mmol/L (ref 22–32)
CREATININE: 0.88 mg/dL (ref 0.44–1.00)
GFR calc Af Amer: >60 mL/min (ref >60)
GFR calc non Af Amer: >60 mL/min (ref >60)
GLUCOSE: 217 mg/dL — AB (ref 65–99)
Potassium: 3.7 mmol/L (ref 3.5–5.1)
Sodium: 141 mmol/L (ref 135–145)

## 2015-11-12 LAB — CBC
HEMATOCRIT: 32.7 % — AB (ref 36.0–46.0)
HEMOGLOBIN: 10.1 g/dL — AB (ref 12.0–15.0)
MCH: 28.2 pg (ref 26.0–34.0)
MCHC: 30.9 g/dL (ref 30.0–36.0)
MCV: 91.3 fL (ref 78.0–100.0)
Platelets: 176 10*3/uL (ref 150–400)
RBC: 3.58 MIL/uL — ABNORMAL LOW (ref 3.87–5.11)
RDW: 14.3 % (ref 11.5–15.5)
WBC: 8.7 10*3/uL (ref 4.0–10.5)

## 2015-11-12 NOTE — Progress Notes (Signed)
Orthopedic Tech Progress Note Patient Details:  Wanda [name] [birth year] [id number]  Patient ID: [name], female   DOB: 04-14-1966, 50 y.o.   MRN: [id number] Pt. was up in chair. I was unable to put on cpm. Told pt. to have staff call me if she wants to get in cpm when put back to bed.  Karolee Stamps 11/12/2015, 8:32 PM

## 2015-11-12 NOTE — Evaluation (Signed)
Occupational Therapy Evaluation Patient Details Name: Wanda [name] MRN: [id number] DOB: 1966/03/04 Today's Date: 11/12/2015    History of Present Illness Pt is a 50 y/o F s/p Rt TKA.  Pt's PMH includes lumbar spinal stenosis, cervical disc disorder and radiculopathy of cervical region, weakness of Lt side of body, CVA(?) vs. atypical migraine, obesity, depression, anemia, Lt TKA, L5-S1 leminotomy and L4-5; L5-S1 lumbar fusion.     Clinical Impression   This 50 yo female admitted with above presents to acute OT with deficits below (see OT problem list) affecting her PLOF with basic ADLs. She will benefit from one more session of OT to address tub transfers with tub bench.    Follow Up Recommendations  No OT follow up    Equipment Recommendations  Tub/shower bench;Other (comment) (pt to get on her own)       Precautions / Restrictions Precautions Precautions: Fall;Knee Precaution Booklet Issued: Yes (comment) Precaution Comments: Reviewed importance of maintaining Rt knee extension Restrictions Weight Bearing Restrictions: No RLE Weight Bearing: Weight bearing as tolerated      Mobility Bed Mobility Overal bed mobility: Needs Assistance Bed Mobility: Sit to Supine       Sit to supine: Min assist (for LE only)      Transfers Overall transfer level: Needs assistance Equipment used: Rolling walker (2 wheeled) Transfers: Sit to/from Stand Sit to Stand: Min guard                   ADL Overall ADL's : Needs assistance/impaired Eating/Feeding: Independent;Sitting   Grooming: Set up;Sitting   Upper Body Bathing: Set up;Sitting   Lower Body Bathing: Moderate assistance (Min A sit<>stand)   Upper Body Dressing : Set up;Sitting   Lower Body Dressing: Maximal assistance (min A sit<>stand)   Toilet Transfer: Minimal assistance;Stand-pivot;RW   Toileting- Clothing Manipulation and Hygiene: Moderate assistance (min A sit<>stand)         General ADL  Comments: educated pt on the most efficient sequence of getting dressed. Pt reports family will A her prn until she can do this by herself            Pertinent Vitals/Pain Pain Assessment: 0-10 Pain Score: 10-Worst pain ever Pain Location: R knee Pain Descriptors / Indicators: Aching;Sore Pain Intervention(s): Limited activity within patient's tolerance;Monitored during session;Premedicated before session;Repositioned;RN gave pain meds during session;Ice applied     Hand Dominance Right   Extremity/Trunk Assessment Upper Extremity Assessment Upper Extremity Assessment: Generalized weakness           Communication Communication Communication: No difficulties   Cognition Arousal/Alertness: Awake/alert Behavior During Therapy: WFL for tasks assessed/performed Overall Cognitive Status: Within Functional Limits for tasks assessed                                Home Living Family/patient expects to be discharged to:: Private residence Living Arrangements: Children Available Help at Discharge: Family;Friend(s);Available 24 hours/day Type of Home: Apartment Home Access: Level entry     Home Layout: One level     Bathroom Shower/Tub: Tub/shower unit Shower/tub characteristics: Curtain Biochemist, clinical: Handicapped height     Home Equipment: Environmental consultant - 2 wheels;Cane - single point;Bedside commode   Additional Comments: Daughter is in school.  Pt has already arranged for friends to take shifts staying w/ her at home at d/c.      Prior Functioning/Environment Level of Independence: Needs assistance  Gait / Transfers Assistance Needed:  Cane at all times due to poor balance and pain.  Says she gets dizzy w/ tub transfer so she always has her daughter w/ her for supervision and assist at times.   ADL's / Homemaking Assistance Needed: Daughter assists pt w/ dressing (donning shirt, pulling up pants, and tying shoes).   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)    OT Diagnosis: Generalized weakness;Acute pain   OT Problem List: Decreased strength;Decreased range of motion;Impaired balance (sitting and/or standing);Decreased activity tolerance;Pain;Obesity;Decreased knowledge of use of DME or AE   OT Treatment/Interventions: Self-care/ADL training;Patient/family education;Balance training;DME and/or AE instruction;Therapeutic activities    OT Goals(Current goals can be found in the care plan section) Acute Rehab OT Goals Patient Stated Goal: home tomorrow maybe OT Goal Formulation: With patient Time For Goal Achievement: 11/19/15 Potential to Achieve Goals: Good  OT Frequency: Min 2X/week              End of Session Equipment Utilized During Treatment: Rolling walker Nurse Communication: Patient requests pain meds  Activity Tolerance: Patient limited by pain Patient left: in bed;with call bell/phone within reach;with bed alarm set   Time: [id number] OT Time Calculation (min): 21 min Charges:  OT General Charges $OT Visit: 1 Procedure OT Evaluation $OT Eval Moderate Complexity: 1 Procedure  Almon Register [id number] 11/12/2015, 2:49 PM

## 2015-11-12 NOTE — Progress Notes (Signed)
Orthopedic Tech Progress Note Patient Details:  Wanda [name] [birth year] [id number]  CPM Right Knee CPM Right Knee: On Right Knee Flexion (Degrees): 40 Right Knee Extension (Degrees): 10 Additional Comments: Trapeze bar and foot roll   [place] Pink 11/12/2015, 4:02 PM

## 2015-11-12 NOTE — Progress Notes (Addendum)
Physical Therapy Treatment Patient Details Name: Wanda [name] MRN: [id number] DOB: 05-11-1966 Today's Date: 11/12/2015    History of Present Illness Pt is a 50 y/o F s/p Rt TKA.  Pt's PMH includes lumbar spinal stenosis, cervical disc disorder and radiculopathy of cervical region, weakness of Lt side of body, CVA(?) vs. atypical migraine, obesity, depression, anemia, Lt TKA, L5-S1 leminotomy and L4-5; L5-S1 lumbar fusion.      PT Comments    Patient limited by lethargy. Unsafe for any OOB mobility and unable to stay awake to follow instructions ~ 75% of time. Continue to progress as tolerated and assess progress when pt is alert.   Follow Up Recommendations  Home health PT;Supervision for mobility/OOB     Equipment Recommendations  None recommended by PT    Recommendations for Other Services OT consult     Precautions / Restrictions Precautions Precautions: Fall;Knee Precaution Booklet Issued: Yes (comment) Precaution Comments: Reviewed importance of maintaining Rt knee extension Restrictions Weight Bearing Restrictions: Yes RLE Weight Bearing: Weight bearing as tolerated    Mobility  Bed Mobility Overal bed mobility: Needs Assistance Bed Mobility: Supine to Sit;Sit to Supine     Supine to sit: Min assist;HOB elevated Sit to supine: Max assist   General bed mobility comments: multimodal cues for technique; max use of bedrail; assist to bring bilat LE to EOB and elevate trunk into sitting; pt required mod A to sit EOB with multimodal cues for hand placement for balance; pt not responding to therapist questions and too lethargic for OOB mobility; max A to return to supine and max A +2 to scoot up in bed with max cues for technique and sequencing  Transfers Overall transfer level: Needs assistance Equipment used: Rolling walker (2 wheeled) Transfers: Sit to/from Stand Sit to Stand: Min guard         General transfer comment: deferred for patient/therapist  safety  Ambulation/Gait                 Stairs            Wheelchair Mobility    Modified Rankin (Stroke Patients Only)       Balance Overall balance assessment: Needs assistance Sitting-balance support: Feet supported;Bilateral upper extremity supported Sitting balance-Leahy Scale: Poor                              Cognition Arousal/Alertness: Lethargic;Suspect due to medications (pt barely keeping eyes open) Behavior During Therapy: Flat affect Overall Cognitive Status: Within Functional Limits for tasks assessed                      Exercises      General Comments        Pertinent Vitals/Pain Pain Assessment: Faces Faces Pain Scale: No hurt    Home Living Family/patient expects to be discharged to:: Private residence Living Arrangements: Children Available Help at Discharge: Family;Friend(s);Available 24 hours/day Type of Home: Apartment Home Access: Level entry   Home Layout: One level Home Equipment: Walker - 2 wheels;Cane - single point;Bedside commode Additional Comments: Daughter is in school.  Pt has already arranged for friends to take shifts staying w/ her at home at d/c.    Prior Function Level of Independence: Needs assistance  Gait / Transfers Assistance Needed: Cane at all times due to poor balance and pain.  Says she gets dizzy w/ tub transfer so she always has her daughter w/  her for supervision and assist at times.   ADL's / Homemaking Assistance Needed: Daughter assists pt w/ dressing (donning shirt, pulling up pants, and tying shoes). 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)   PT Goals (current goals can now be found in the care plan section) Acute Rehab PT Goals Patient Stated Goal: none stated PT Goal Formulation: With patient Time For Goal Achievement: 11/18/15 Potential to Achieve Goals: Good Progress towards PT goals: Not progressing toward  goals - comment (lethargic suspect due to medications)    Frequency  7X/week    PT Plan Current plan remains appropriate    Co-evaluation             End of Session Equipment Utilized During Treatment: Gait belt Activity Tolerance: Patient limited by lethargy Patient left: in bed;with call bell/phone within reach;with bed alarm set     Time: [id number] PT Time Calculation (min) (ACUTE ONLY): 15 min  Charges:  $Therapeutic Activity: 8-22 mins                    G Codes:      Salina April, PTA Pager: [phone]   11/12/2015, 3:31 PM

## 2015-11-12 NOTE — Progress Notes (Signed)
Patient ID: Wanda [name], female   DOB: [birth year], 50 y.o.   MRN: [id number] PATIENT ID: Wanda [name]  MRN: [id number]  DOB/AGE:  1965-11-28 / 50 y.o.  1 Day Post-Op Procedure(s) (LRB): TOTAL KNEE ARTHROPLASTY (Right)    PROGRESS NOTE Subjective: Patient is alert, oriented, x1 Nausea, no Vomiting, yes passing gas. Taking PO sips. Denies SOB, Chest or Calf Pain. Using Incentive Spirometer, PAS in place. Ambulate 30 ft, CPM 0-40 Patient reports pain as 7/10 .    Objective: Vital signs in last 24 hours: Filed Vitals:   11/11/15 2031 11/12/15 0100 11/12/15 0215 11/12/15 0300  BP: 96/40 106/48 101/39 118/69  Pulse: 81 84 91 94  Temp: 97.6 F (36.4 C) 97.5 F (36.4 C) 98.5 F (36.9 C) 98.9 F (37.2 C)  TempSrc: Oral Oral Oral Oral  Resp: 16 16  18   SpO2: 99% 99% 94% 94%      Intake/Output from previous day: I/O last 3 completed shifts: In: 2670 [P.O.:720; I.V.:1950] Out: 100 [Blood:100]   Intake/Output this shift:     LABORATORY DATA:  Recent Labs  11/11/15 0637  GLUCAP 123*    Examination: Neurologically intact ABD soft Neurovascular intact Sensation intact distally Intact pulses distally Dorsiflexion/Plantar flexion intact Incision: dressing C/D/I No cellulitis present Compartment soft}  Assessment:   1 Day Post-Op Procedure(s) (LRB): TOTAL KNEE ARTHROPLASTY (Right) ADDITIONAL DIAGNOSIS: Expected Acute Blood Loss Anemia, obesity, HA's  Plan: PT/OT WBAT, CPM 5/hrs day until ROM 0-90 degrees, then D/C CPM DVT Prophylaxis:  SCDx72hrs, ASA 325 mg BID x 2 weeks DISCHARGE PLAN: Home DISCHARGE NEEDS: HHPT, CPM, Walker and 3-in-1 comode seat     Burlin Mcnair J 11/12/2015, 6:46 AM

## 2015-11-12 NOTE — Progress Notes (Signed)
Physical Therapy Treatment Patient Details Name: Wanda [name] MRN: [id number] DOB: 1966/01/15 Today's Date: 11/12/2015    History of Present Illness Pt is a 50 y/o F s/p Rt TKA.  Pt's PMH includes lumbar spinal stenosis, cervical disc disorder and radiculopathy of cervical region, weakness of Lt side of body, CVA(?) vs. atypical migraine, obesity, depression, anemia, Lt TKA, L5-S1 leminotomy and L4-5; L5-S1 lumbar fusion.      PT Comments    Patient limited by lethargy this session. Led through therex. Pt declined OOB mobility due to c/o pain. Continue to progress as tolerated.   Follow Up Recommendations  Home health PT;Supervision for mobility/OOB     Equipment Recommendations  None recommended by PT    Recommendations for Other Services OT consult     Precautions / Restrictions Precautions Precautions: Fall;Knee Precaution Booklet Issued: Yes (comment) Precaution Comments: Reviewed importance of maintaining Rt knee extension Restrictions Weight Bearing Restrictions: Yes RLE Weight Bearing: Weight bearing as tolerated    Mobility  Bed Mobility                  Transfers                    Ambulation/Gait                 Stairs            Wheelchair Mobility    Modified Rankin (Stroke Patients Only)       Balance                                    Cognition Arousal/Alertness: Lethargic;Suspect due to medications (fell asleep during session (received dilaudid)) Behavior During Therapy: WFL for tasks assessed/performed Overall Cognitive Status: Within Functional Limits for tasks assessed                      Exercises General Exercises - Lower Extremity Ankle Circles/Pumps: AROM;Both;10 reps;Supine Quad Sets: Strengthening;Both;10 reps;Supine Short Arc Quad: AROM;AAROM;Right;10 reps;Supine Heel Slides: AROM;Right;Supine;10 reps    General Comments General comments (skin integrity, edema, etc.):  pt declined any OOB mobility due to c/o pain; agreeable to supine therex; pt lethargic during session with eyes closed most of the time and asleep end of session       Pertinent Vitals/Pain Pain Assessment: 0-10 Pain Score: 10-Worst pain ever Pain Location: R knee Pain Descriptors / Indicators: Aching;Sore Pain Intervention(s): Limited activity within patient's tolerance;Monitored during session;Premedicated before session;Repositioned;RN gave pain meds during session;Ice applied    Home Living                      Prior Function            PT Goals (current goals can now be found in the care plan section) Acute Rehab PT Goals Patient Stated Goal: less pain PT Goal Formulation: With patient Time For Goal Achievement: 11/18/15 Potential to Achieve Goals: Good Progress towards PT goals: Progressing toward goals    Frequency  7X/week    PT Plan Current plan remains appropriate    Co-evaluation             End of Session Equipment Utilized During Treatment: Gait belt Activity Tolerance: Patient limited by lethargy Patient left: in bed;with call bell/phone within reach;with bed alarm set     Time: 1107-1130 PT Time Calculation (min) (ACUTE ONLY):  23 min  Charges:  $Therapeutic Exercise: 23-37 mins                    G Codes:      Salina April, PTA Pager: [phone]   11/12/2015, 11:36 AM

## 2015-11-13 LAB — CBC
HEMATOCRIT: 30.3 % — AB (ref 36.0–46.0)
Hemoglobin: 9.6 g/dL — ABNORMAL LOW (ref 12.0–15.0)
MCH: 29.2 pg (ref 26.0–34.0)
MCHC: 31.7 g/dL (ref 30.0–36.0)
MCV: 92.1 fL (ref 78.0–100.0)
PLATELETS: 167 10*3/uL (ref 150–400)
RBC: 3.29 MIL/uL — ABNORMAL LOW (ref 3.87–5.11)
RDW: 14.4 % (ref 11.5–15.5)
WBC: 10.6 10*3/uL — ABNORMAL HIGH (ref 4.0–10.5)

## 2015-11-13 NOTE — Progress Notes (Signed)
PATIENT ID: Wanda [name]  MRN: [id number]  DOB/AGE:  [birth year] / 50 y.o.  2 Days Post-Op Procedure(s) (LRB): TOTAL KNEE ARTHROPLASTY (Right)    PROGRESS NOTE Subjective: Patient is alert, oriented, no Nausea, no Vomiting, yes passing gas. Taking PO with small bites. Denies SOB, Chest or Calf Pain. Using Incentive Spirometer, PAS in place. Ambulate WBAT with pt up in restroom, CPM 0-50 Patient reports pain as 10/10 .    Objective: Vital signs in last 24 hours: Filed Vitals:   11/12/15 0215 11/12/15 0300 11/12/15 1226 11/13/15 0500  BP: 101/39 118/69 112/46 113/46  Pulse: 91 94 84 114  Temp: 98.5 F (36.9 C) 98.9 F (37.2 C) 99.2 F (37.3 C) 99.6 F (37.6 C)  TempSrc: Oral Oral Oral Oral  Resp:  18 20 16   SpO2: 94% 94% 90% 93%      Intake/Output from previous day: I/O last 3 completed shifts: In: [id number] [I.V.:4625] Out: -    Intake/Output this shift:     LABORATORY DATA:  Recent Labs  11/11/15 0637 11/12/15 0853 11/13/15 0311  WBC  --  8.7 10.6*  HGB  --  10.1* 9.6*  HCT  --  32.7* 30.3*  PLT  --  176 167  NA  --  141  --   K  --  3.7  --   CL  --  107  --   CO2  --  27  --   BUN  --  8  --   CREATININE  --  0.88  --   GLUCOSE  --  217*  --   GLUCAP 123*  --   --   CALCIUM  --  8.5*  --     Examination: Neurologically intact Neurovascular intact Sensation intact distally Intact pulses distally Dorsiflexion/Plantar flexion intact Incision: dressing C/D/I No cellulitis present} Right calf pain and swelling  Assessment:   2 Days Post-Op Procedure(s) (LRB): TOTAL KNEE ARTHROPLASTY (Right) ADDITIONAL DIAGNOSIS: Expected Acute Blood Loss Anemia, obesity and head aches.  Plan: PT/OT WBAT, CPM 5/hrs day until ROM 0-90 degrees, then D/C CPM DVT Prophylaxis:  SCDx72hrs, ASA 325 mg BID x 2 weeks DISCHARGE PLAN: Home, when pt passes therapy goals. DISCHARGE NEEDS: HHPT, CPM, Walker and 3-in-1 comode seat  VDUS to rule out DVT     PHILLIPS, ERIC  R 11/13/2015, 8:05 AM

## 2015-11-13 NOTE — Progress Notes (Signed)
Occupational Therapy Treatment and Discharge Patient Details Name: DORANNE SCHMUTZ MRN: [id number] DOB: 11-24-1965 Today's Date: 11/13/2015    History of present illness Pt is a 50 y/o F s/p Rt TKA.  Pt's PMH includes lumbar spinal stenosis, cervical disc disorder and radiculopathy of cervical region, weakness of Lt side of body, CVA(?) vs. atypical migraine, obesity, depression, anemia, Lt TKA, L5-S1 leminotomy and L4-5; L5-S1 lumbar fusion.     OT comments  This 50 yo female admitted and underwent above presents to acute OT with all education complete, we will D/C from acute OT.  Follow Up Recommendations  No OT follow up    Equipment Recommendations  Tub/shower bench;Other (comment) (pt to get on her own)       Precautions / Restrictions Precautions Precautions: Fall;Knee Restrictions Weight Bearing Restrictions: No RLE Weight Bearing: Weight bearing as tolerated       Mobility Bed Mobility               General bed mobility comments: Pt coming out of bathroom when I entered room  Transfers Overall transfer level: Needs assistance Equipment used: Rolling walker (2 wheeled) Transfers: Sit to/from Stand Sit to Stand: Min guard                  ADL Overall ADL's : Needs assistance/impaired                         Toilet Transfer: Ambulation;RW;BSC;Min guard (over [employer])   Toileting- Water quality scientist and Hygiene: Sit to/from stand;Min guard   Tub/ Banker: Tub transfer;Minimal assistance;Ambulation;Rolling walker;Tub bench (A for RLE only and min guard A sit<>stand)                      Cognition   Behavior During Therapy: WFL for tasks assessed/performed Overall Cognitive Status: Within Functional Limits for tasks assessed                                    Pertinent Vitals/ Pain       Pain Assessment: Faces Faces Pain Scale: Hurts little more Pain Location: right knee Pain Descriptors /  Indicators: Aching;Sore Pain Intervention(s): Monitored during session;Repositioned         Frequency Min 2X/week     Progress Toward Goals  OT Goals(current goals can now be found in the care plan section)  Progress towards OT goals: Goals met/education completed, patient discharged from [facility] Discharge plan remains appropriate       End of Session Equipment Utilized During Treatment: Rolling walker   Activity Tolerance Patient tolerated treatment well   Patient Left in chair;with call bell/phone within reach   Nurse Communication  (NT made aware pt was back in room to follow up about bath--pt declined for now, too tired)        Time: 7209-4709 OT Time Calculation (min): 34 min  Charges: OT General Charges $OT Visit: 1 Procedure OT Evaluation $OT Eval Moderate Complexity: 1 Procedure OT Treatments $Self Care/Home Management : 23-37 mins  Almon Register [phone] 11/13/2015, 10:04 AM

## 2015-11-13 NOTE — Progress Notes (Signed)
Orthopedic Tech Progress Note Patient Details:  Wanda [name] 1966-01-10 [id number]  Patient ID: Wanda [name], female   DOB: May 15, 1966, 50 y.o.   MRN: [id number] Applied cpm 0-50. This is what it was already at.  Karolee Stamps 11/13/2015, 5:21 AM

## 2015-11-13 NOTE — Progress Notes (Signed)
Physical Therapy Treatment Patient Details Name: Wanda [name] MRN: [id number] DOB: [birth year] Today's Date: 11/13/2015    History of Present Illness Pt is a 50 y/o F s/p Rt TKA.  Pt's PMH includes lumbar spinal stenosis, cervical disc disorder and radiculopathy of cervical region, weakness of Lt side of body, CVA(?) vs. atypical migraine, obesity, depression, anemia, Lt TKA, L5-S1 leminotomy and L4-5; L5-S1 lumbar fusion.      PT Comments    Pt performed increased mobility.  Remains lethargic with mild complaints of dizziness.  Pt intermittently shutting eyes during tx.  Pt making slow progress limited by lack of alertness and lethargy.   Follow Up Recommendations  Home health PT;Supervision for mobility/OOB     Equipment Recommendations  None recommended by PT    Recommendations for Other Services       Precautions / Restrictions Precautions Precautions: Fall;Knee Precaution Booklet Issued: Yes (comment) Precaution Comments: Reviewed importance of maintaining Rt knee extension Restrictions Weight Bearing Restrictions: Yes RLE Weight Bearing: Weight bearing as tolerated    Mobility  Bed Mobility Overal bed mobility: Needs Assistance             General bed mobility comments: Pt coming out of bathroom when I entered room  Transfers Overall transfer level: Needs assistance Equipment used: Rolling walker (2 wheeled) Transfers: Sit to/from Stand Sit to Stand: Min guard         General transfer comment: Pt recieved in recliner chair.    Ambulation/Gait Ambulation/Gait assistance: Min guard Ambulation Distance (Feet): 20 Feet (+70 ft.  ) Assistive device: Rolling walker (2 wheeled) Gait Pattern/deviations: Step-to pattern;Decreased stride length;Decreased weight shift to right;Antalgic   Gait velocity interpretation: Below normal speed for age/gender General Gait Details: Pt lethargic with mild c/o dizziness.  Pt required cues for weight shifting R and  increasing gait speed to normalize pattern.     Stairs            Wheelchair Mobility    Modified Rankin (Stroke Patients Only)       Balance     Sitting balance-Leahy Scale: Fair       Standing balance-Leahy Scale: Poor (fair-poor)                      Cognition Arousal/Alertness: Awake/alert Behavior During Therapy: WFL for tasks assessed/performed Overall Cognitive Status: Within Functional Limits for tasks assessed                      Exercises Total Joint Exercises Ankle Circles/Pumps: AROM;Both;15 reps;Supine Quad Sets: AROM;Right;10 reps;Supine Gluteal Sets: AROM;Both;10 reps;Supine Heel Slides: AROM;Right;10 reps;AAROM;Supine Hip ABduction/ADduction: AROM;Right;10 reps;AAROM;Supine Straight Leg Raises: Right;10 reps;AAROM;Supine    General Comments        Pertinent Vitals/Pain Pain Assessment: 0-10 Pain Score: 3  Faces Pain Scale: Hurts little more Pain Location: R knee Pain Descriptors / Indicators: Aching;Sore Pain Intervention(s): Monitored during session;Repositioned    Home Living                      Prior Function            PT Goals (current goals can now be found in the care plan section) Acute Rehab PT Goals Patient Stated Goal: none stated Potential to Achieve Goals: Good Progress towards PT goals: Progressing toward goals    Frequency  7X/week    PT Plan Current plan remains appropriate    Co-evaluation  End of Session Equipment Utilized During Treatment: Gait belt Activity Tolerance: Patient limited by lethargy Patient left: in bed;with call bell/phone within reach;with bed alarm set     Time: [id number] PT Time Calculation (min) (ACUTE ONLY): 30 min  Charges:  $Gait Training: 8-22 mins $Therapeutic Exercise: 8-22 mins                    G Codes:      Cristela Blue 2015/12/07, 1:53 PM  Governor Rooks, PTA pager [phone]

## 2015-11-13 NOTE — Progress Notes (Signed)
Physical Therapy Treatment Patient Details Name: Wanda [name] MRN: [id number] DOB: November 17, 1965 Today's Date: 11/13/2015    History of Present Illness Pt is a 50 y/o F s/p Rt TKA.  Pt's PMH includes lumbar spinal stenosis, cervical disc disorder and radiculopathy of cervical region, weakness of Lt side of body, CVA(?) vs. atypical migraine, obesity, depression, anemia, Lt TKA, L5-S1 leminotomy and L4-5; L5-S1 lumbar fusion.      PT Comments    Pt more alert progressing with POC well, will cont POC for duration of hospitalization.  Plan to review complete HEP next session.    Follow Up Recommendations  Home health PT;Supervision for mobility/OOB     Equipment Recommendations  None recommended by PT    Recommendations for Other Services       Precautions / Restrictions Precautions Precautions: Fall;Knee Precaution Booklet Issued: Yes (comment) Precaution Comments: Reviewed importance of maintaining Rt knee extension Restrictions Weight Bearing Restrictions: Yes RLE Weight Bearing: Weight bearing as tolerated    Mobility  Bed Mobility Overal bed mobility: Needs Assistance Bed Mobility: Supine to Sit;Sit to Supine     Supine to sit: Supervision;HOB elevated Sit to supine: Supervision   General bed mobility comments: Pt required cues for B LEs negotiation to edge of the bed.    Transfers Overall transfer level: Needs assistance Equipment used: Rolling walker (2 wheeled) Transfers: Sit to/from Stand Sit to Stand: Supervision;Min guard         General transfer comment: Cues for hand placement, weight shifting forward and anterior translation.  Pt required cues for upper trunk control.  Pt pulls on RW before boosting bottom completely from mat table.    Ambulation/Gait Ambulation/Gait assistance: Min guard Ambulation Distance (Feet): 140 Feet Assistive device: Rolling walker (2 wheeled) Gait Pattern/deviations: Step-through pattern;Antalgic;Decreased stride  length   Gait velocity interpretation: Below normal speed for age/gender General Gait Details: Pt more alert and presents with improved weight shifting.  pt required cues for turns and backing.     Stairs            Wheelchair Mobility    Modified Rankin (Stroke Patients Only)       Balance Overall balance assessment: Needs assistance   Sitting balance-Leahy Scale: Good       Standing balance-Leahy Scale: Fair                      Cognition Arousal/Alertness: Awake/alert Behavior During Therapy: WFL for tasks assessed/performed Overall Cognitive Status: Within Functional Limits for tasks assessed                      Exercises Total Joint Exercises Ankle Circles/Pumps: AROM;Both;15 reps;Supine Quad Sets: AROM;Right;10 reps;Supine Gluteal Sets: AROM;Both;10 reps;Supine Short Arc Quad: AROM;Right;10 reps Heel Slides: AROM;Right;10 reps;AAROM;Supine Hip ABduction/ADduction: AROM;Right;10 reps;AAROM;Supine Straight Leg Raises: Right;10 reps;AAROM;Supine    General Comments        Pertinent Vitals/Pain Pain Assessment: 0-10 Pain Score: 3  Pain Location: R knee Pain Descriptors / Indicators: Grimacing;Guarding Pain Intervention(s): Monitored during session;Repositioned;Ice applied    Home Living                      Prior Function            PT Goals (current goals can now be found in the care plan section) Acute Rehab PT Goals Patient Stated Goal: none stated Potential to Achieve Goals: Good Progress towards PT goals: Progressing toward goals  Frequency  7X/week    PT Plan Current plan remains appropriate    Co-evaluation             End of Session Equipment Utilized During Treatment: Gait belt Activity Tolerance: Patient limited by lethargy Patient left: in bed;with call bell/phone within reach;with bed alarm set     Time: [id number] PT Time Calculation (min) (ACUTE ONLY): 19 min  Charges:  Gait  training                    G Codes:      Cristela Blue Dec 07, 2015, 4:31 PM  Governor Rooks, PTA pager [phone]

## 2015-11-14 ENCOUNTER — Inpatient Hospital Stay (HOSPITAL_COMMUNITY): Payer: BLUE CROSS/BLUE SHIELD

## 2015-11-14 ENCOUNTER — Encounter (HOSPITAL_COMMUNITY): Payer: Self-pay | Admitting: General Practice

## 2015-11-14 DIAGNOSIS — M79661 Pain in right lower leg: Secondary | ICD-10-CM

## 2015-11-14 LAB — CBC
HCT: 30.1 % — ABNORMAL LOW (ref 36.0–46.0)
Hemoglobin: 9.2 g/dL — ABNORMAL LOW (ref 12.0–15.0)
MCH: 27.6 pg (ref 26.0–34.0)
MCHC: 30.6 g/dL (ref 30.0–36.0)
MCV: 90.4 fL (ref 78.0–100.0)
PLATELETS: 172 10*3/uL (ref 150–400)
RBC: 3.33 MIL/uL — AB (ref 3.87–5.11)
RDW: 14.2 % (ref 11.5–15.5)
WBC: 9.7 10*3/uL (ref 4.0–10.5)

## 2015-11-14 NOTE — Progress Notes (Signed)
VASCULAR LAB PRELIMINARY  PRELIMINARY  PRELIMINARY  PRELIMINARY  Right lower extremity venous duplex completed.    Preliminary report:  There is no DVT or SVT noted in the right lower extremity.   Eula Mazzola, RVT 11/14/2015, 10:07 AM

## 2015-11-14 NOTE — Progress Notes (Signed)
Dr. Mayer Camel notified that doppler result of patient's right lower extremity was negative. Requested that he enter discharge orders for patient on this date.

## 2015-11-14 NOTE — Progress Notes (Signed)
Patient ID: Wanda [name], female   DOB: 07-04-1966, 50 y.o.   MRN: [id number] PATIENT ID: Wanda [name]  MRN: [id number]  DOB/AGE:  May 05, 1966 / 50 y.o.  3 Days Post-Op Procedure(s) (LRB): TOTAL KNEE ARTHROPLASTY (Right)    PROGRESS NOTE Subjective: Patient is alert, oriented, no Nausea, no Vomiting, yes passing gas. Taking PO well. Denies SOB, Chest or Calf Pain. Using Incentive Spirometer, PAS in place. Ambulate 175ft, CPM 0-40 Patient reports pain as 5/10 .    Objective: Vital signs in last 24 hours: Filed Vitals:   11/13/15 0500 11/13/15 1411 11/13/15 2041 11/14/15 0457  BP: 113/46 102/45 122/50 109/52  Pulse: 114 89 91 93  Temp: 99.6 F (37.6 C) 98.2 F (36.8 C) 98.8 F (37.1 C) 98.8 F (37.1 C)  TempSrc: Oral Oral  Oral  Resp: 16 16 16 16   SpO2: 93% 96% 98% 97%      Intake/Output from previous day: I/O last 3 completed shifts: In: [id number] [P.O.:720; I.V.:4625] Out: -    Intake/Output this shift: Total I/O In: 240 [P.O.:240] Out: -    LABORATORY DATA:  Recent Labs  11/12/15 0853 11/13/15 0311 11/14/15 0435  WBC 8.7 10.6* 9.7  HGB 10.1* 9.6* 9.2*  HCT 32.7* 30.3* 30.1*  PLT 176 167 172  NA 141  --   --   K 3.7  --   --   CL 107  --   --   CO2 27  --   --   BUN 8  --   --   CREATININE 0.88  --   --   GLUCOSE 217*  --   --   CALCIUM 8.5*  --   --     Examination: Neurologically intact ABD soft Neurovascular intact Sensation intact distally Intact pulses distally Dorsiflexion/Plantar flexion intact Incision: dressing C/D/I No cellulitis present Compartment soft}  Assessment:   3 Days Post-Op Procedure(s) (LRB): TOTAL KNEE ARTHROPLASTY (Right) ADDITIONAL DIAGNOSIS: Expected Acute Blood Loss Anemia, Hx TIA, obesity  Plan: PT/OT WBAT, CPM 5/hrs day until ROM 0-90 degrees, then D/C CPM DVT Prophylaxis:  SCDx72hrs, ASA 325 mg BID x 2 weeks DISCHARGE PLAN: Home, after doppler exam DISCHARGE NEEDS: HHPT, CPM, Walker and 3-in-1 comode  seat     Wanda [name] J 11/14/2015, 9:02 AM

## 2015-11-14 NOTE — Progress Notes (Signed)
Orthopedic Tech Progress Note Patient Details:  Wanda [name] July 08, 1966 [id number]  Patient ID: [name], female   DOB: 07-May-1966, 50 y.o.   MRN: [id number] Applied cpm 0-50  Karolee Stamps 11/14/2015, 6:01 AM

## 2015-11-14 NOTE — Progress Notes (Signed)
Physical Therapy Treatment Patient Details Name: Wanda [name] MRN: [id number] DOB: 08-31-1965 Today's Date: 11/14/2015    History of Present Illness Pt is a 50 y/o F s/p Rt TKA.  Pt's PMH includes lumbar spinal stenosis, cervical disc disorder and radiculopathy of cervical region, weakness of Lt side of body, CVA(?) vs. atypical migraine, obesity, depression, anemia, Lt TKA, L5-S1 leminotomy and L4-5; L5-S1 lumbar fusion.      PT Comments    Pt performed increased gait with decreased assistance.  PTA issued HEP and reviewed packet for strengthening and ROM at home.  Pt able to teach back safety methods and therapeutic exercises.  Pt set to d/c home.  RN notified that therapy is complete.     Follow Up Recommendations  Home health PT;Supervision for mobility/OOB     Equipment Recommendations  None recommended by PT    Recommendations for Other Services       Precautions / Restrictions Precautions Precautions: Fall;Knee Precaution Booklet Issued: Yes (comment) Precaution Comments: Reviewed importance of maintaining Rt knee extension Restrictions Weight Bearing Restrictions: Yes RLE Weight Bearing: Weight bearing as tolerated    Mobility  Bed Mobility Overal bed mobility: Modified Independent       Supine to sit: Modified independent (Device/Increase time) Sit to supine: Modified independent (Device/Increase time)   General bed mobility comments: Demonstrates good technique and used nonoperative leg to advance operative leg to improve ease and independence.    Transfers Overall transfer level: Needs assistance   Transfers: Sit to/from Stand Sit to Stand: Supervision         General transfer comment: Cues for hand placement.  Pt remains to attempt to pull on RW.  No LOB noted.  Pt demonstrated improved eccentric loading to seated surface.    Ambulation/Gait Ambulation/Gait assistance: Supervision Ambulation Distance (Feet): 250 Feet Assistive device:  Rolling walker (2 wheeled) Gait Pattern/deviations: Step-through pattern;Antalgic;Decreased stride length   Gait velocity interpretation: Below normal speed for age/gender General Gait Details: Pt able to improve stride length from previous session.  Pt required cues for forward gaze and R heel strike.     Stairs Stairs:  (Pt lives in one level no stairs to enter home.  )          Wheelchair Mobility    Modified Rankin (Stroke Patients Only)       Balance Overall balance assessment: Needs assistance Sitting-balance support: Bilateral upper extremity supported;Feet supported Sitting balance-Leahy Scale: Normal     Standing balance support: Bilateral upper extremity supported Standing balance-Leahy Scale: Fair Standing balance comment: with RW for support.                      Cognition Arousal/Alertness: Awake/alert Behavior During Therapy: WFL for tasks assessed/performed Overall Cognitive Status: Within Functional Limits for tasks assessed                      Exercises General Exercises - Lower Extremity Ankle Circles/Pumps: AROM;Both;10 reps;Supine Quad Sets: Strengthening;Both;10 reps;Supine Short Arc Quad: AROM;Right;10 reps;Supine Long Arc Quad: AROM;Right;10 reps;Seated Heel Slides: AROM;Right;Supine;10 reps Hip ABduction/ADduction: AROM;Right;10 reps;Supine Straight Leg Raises: AROM;Right;10 reps;Supine Other Exercises Other Exercises: 1x5 knee flexion AROM with 10 sec hold, AAROM knee flexion 1x5 reps with 10 sec hold.      General Comments        Pertinent Vitals/Pain Pain Assessment: 0-10 Pain Score: 3  Pain Location: R knee Pain Descriptors / Indicators: Guarding;Sore Pain Intervention(s): Monitored during session;Repositioned  Home Living Family/patient expects to be discharged to:: Private residence Living Arrangements: Children                  Prior Function            PT Goals (current goals can now be  found in the care plan section) Acute Rehab PT Goals Patient Stated Goal: none stated Potential to Achieve Goals: Good Progress towards PT goals: Progressing toward goals    Frequency  7X/week    PT Plan Current plan remains appropriate    Co-evaluation             End of Session Equipment Utilized During Treatment: Gait belt Activity Tolerance: Patient tolerated treatment well Patient left: with call bell/phone within reach;with bed alarm set;in chair     Time: [id number] PT Time Calculation (min) (ACUTE ONLY): 31 min  Charges:  $Gait Training: 8-22 mins $Therapeutic Exercise: 8-22 mins                    G Codes:      Cristela Blue 12-11-2015, 2:53 PM Governor Rooks, PTA pager [phone]

## 2015-11-14 NOTE — Discharge Summary (Signed)
Patient ID: Wanda [name] MRN: [id number] DOB/AGE: 1966/07/13 50 y.o.  Admit date: 11/11/2015 Discharge date: 11/14/2015  Admission Diagnoses:  Principal Problem:   Primary osteoarthritis of right knee Active Problems:   Osteoarthritis of right knee   Discharge Diagnoses:  Same  Past Medical History  Diagnosis Date  . Depression   . Hypercholesterolemia     no current med.  . Anxiety   . History of blood transfusion   . Stroke ([place]) 10/27/2012    TIA   1- 10/29/11-weakness left side  . PONV (postoperative nausea and vomiting)     states severe  . Migraines   . History of TIA (transient ischemic attack)     x 2; left-sided weakness  . GERD (gastroesophageal reflux disease)   . Hypertension     states under control with meds., has been on med. since 06/2011  . Seasonal allergies   . Chronic bronchitis ([place])   . Family history of adverse reaction to anesthesia     pt's mother has hx. of post-op N/V  . Acne   . Osteoarthritis   . Keloid [id number]    umbilical  . Anemia     takes iron supplement  . Asthma     Surgeries: Procedure(s): TOTAL KNEE ARTHROPLASTY on 11/11/2015   Consultants:    Discharged Condition: Improved  Hospital Course: ZAIDA WILDBERGER is an 50 y.o. female who was admitted 11/11/2015 for operative treatment ofPrimary osteoarthritis of right knee. Patient has severe unremitting pain that affects sleep, daily activities, and work/hobbies. After pre-op clearance the patient was taken to the operating room on 11/11/2015 and underwent  Procedure(s): TOTAL KNEE ARTHROPLASTY.    Patient was given perioperative antibiotics: Anti-infectives    Start     Dose/Rate Route Frequency Ordered Stop   11/11/15 0840  cefUROXime (ZINACEF) injection  Status:  Discontinued       As needed 11/11/15 0904 11/11/15 0925   11/11/15 0700  ceFAZolin (ANCEF) 3 g in dextrose 5 % 50 mL IVPB     3 g 130 mL/hr over 30 Minutes Intravenous To [facility] 11/08/15 1353  11/11/15 0745   11/11/15 0657  ceFAZolin (ANCEF) 2-4 GM/100ML-% IVPB    Comments:  Rejeana Brock   : cabinet override      11/11/15 0657 11/11/15 1914       Patient was given sequential compression devices, early ambulation, and chemoprophylaxis to prevent DVT.  Patient benefited maximally from hospital stay and there were no complications.    Recent vital signs: Patient Vitals for the past 24 hrs:  BP Temp Temp src Pulse Resp SpO2  11/14/15 0457 (!) 109/52 mmHg 98.8 F (37.1 C) Oral 93 16 97 %  11/13/15 2041 (!) 122/50 mmHg 98.8 F (37.1 C) - 91 16 98 %  11/13/15 1411 (!) 102/45 mmHg 98.2 F (36.8 C) Oral 89 16 96 %     Recent laboratory studies:  Recent Labs  11/12/15 0853 11/13/15 0311 11/14/15 0435  WBC 8.7 10.6* 9.7  HGB 10.1* 9.6* 9.2*  HCT 32.7* 30.3* 30.1*  PLT 176 167 172  NA 141  --   --   K 3.7  --   --   CL 107  --   --   CO2 27  --   --   BUN 8  --   --   CREATININE 0.88  --   --   GLUCOSE 217*  --   --   CALCIUM 8.5*  --   --  Discharge Medications:     Medication List    STOP taking these medications        aspirin 81 MG tablet  Replaced by:  aspirin EC 325 MG tablet     tiZANidine 4 MG capsule  Commonly known as:  ZANAFLEX      TAKE these medications        albuterol 108 (90 Base) MCG/ACT inhaler  Commonly known as:  PROVENTIL HFA;VENTOLIN HFA  Inhale into the lungs every 6 (six) hours as needed for wheezing or shortness of breath.     albuterol (5 MG/ML) 0.5% nebulizer solution  Commonly known as:  PROVENTIL  Take 2.5 mg by nebulization every 6 (six) hours as needed for wheezing or shortness of breath.     AQUA GLYCOLIC CLEANSER EX  Apply 1 application topically 2 (two) times daily.     AQUA GLYCOLIC FACE Crea  Apply 1 application topically 2 (two) times daily.     aspirin EC 325 MG tablet  Take 1 tablet (325 mg total) by mouth 2 (two) times daily.     atenolol 50 MG tablet  Commonly known as:  TENORMIN  Take 50 mg by  mouth daily.     busPIRone 15 MG tablet  Commonly known as:  BUSPAR  Take 15 mg by mouth 2 (two) times daily.     cetirizine 10 MG chewable tablet  Commonly known as:  ZYRTEC  Chew 10 mg by mouth daily.     cyproheptadine 4 MG tablet  Commonly known as:  PERIACTIN  Take 8 mg by mouth 2 (two) times daily.     ferrous sulfate 325 (65 FE) MG tablet  Take 325 mg by mouth 2 (two) times daily with a meal.     Fluticasone-Salmeterol 250-50 MCG/DOSE Aepb  Commonly known as:  ADVAIR  Inhale 1 puff into the lungs 2 (two) times daily.     gabapentin 300 MG capsule  Commonly known as:  NEURONTIN  Take 600 mg by mouth at bedtime.     hydrochlorothiazide 12.5 MG capsule  Commonly known as:  MICROZIDE  Take 12.5 mg by mouth daily.     HYDROmorphone 2 MG tablet  Commonly known as:  DILAUDID  Take 1 tablet (2 mg total) by mouth every 4 (four) hours as needed for severe pain.     methocarbamol 500 MG tablet  Commonly known as:  ROBAXIN  Take 1 tablet (500 mg total) by mouth 2 (two) times daily with a meal.     mometasone 50 MCG/ACT nasal spray  Commonly known as:  NASONEX  Place 2 sprays into the nose daily.     montelukast 10 MG tablet  Commonly known as:  SINGULAIR  Take 10 mg by mouth at bedtime.     mupirocin cream 2 %  Commonly known as:  BACTROBAN  Apply 1 application topically 3 (three) times daily. APPLY TO FACE     ondansetron 8 MG tablet  Commonly known as:  ZOFRAN  Take 8 mg by mouth every 8 (eight) hours as needed for nausea or vomiting.     pantoprazole 40 MG tablet  Commonly known as:  PROTONIX  Take 40 mg by mouth 2 (two) times daily.     rosuvastatin 10 MG tablet  Commonly known as:  CRESTOR  Take 10 mg by mouth daily.     sertraline 100 MG tablet  Commonly known as:  ZOLOFT  Take 100-200 mg by mouth 2 (two) times  daily. Take 100 mg in the am and 200 mg in the pm     Vitamin D (Ergocalciferol) 50000 units Caps capsule  Commonly known as:  DRISDOL  Take  50,000 Units by mouth every 7 (seven) days. mondays     zonisamide 100 MG capsule  Commonly known as:  ZONEGRAN  Take 200 mg by mouth 2 (two) times daily.        Diagnostic Studies: No results found.  Disposition: 01-Home or Self Care      Discharge Instructions    CPM    Complete by:  As directed   Continuous passive motion machine (CPM):      Use the CPM from 0 to 60  for 5 hours per day.      You may increase by 10 degrees per day.  You may break it up into 2 or 3 sessions per day.      Use CPM for 2 weeks or until you are told to stop.     Call MD / Call 911    Complete by:  As directed   If you experience chest pain or shortness of breath, CALL 911 and be transported to the hospital emergency room.  If you develope a fever above 101 F, pus (white drainage) or increased drainage or redness at the wound, or calf pain, call your surgeon's office.     Constipation Prevention    Complete by:  As directed   Drink plenty of fluids.  Prune juice may be helpful.  You may use a stool softener, such as Colace (over the counter) 100 mg twice a day.  Use MiraLax (over the counter) for constipation as needed.     Diet - low sodium heart healthy    Complete by:  As directed      Driving restrictions    Complete by:  As directed   No driving for 2 weeks     Increase activity slowly as tolerated    Complete by:  As directed      Patient may shower    Complete by:  As directed   You may shower without a dressing once there is no drainage.  Do not wash over the wound.  If drainage remains, cover wound with plastic wrap and then shower.           Follow-up Information    Follow up with Kerin Salen, MD In 2 weeks.   Specialty:  Orthopedic Surgery   Contact information:   [place] [address] [phone]        Signed: Hardin Negus, Jerolene Kupfer R 11/14/2015, 11:03 AM

## 2015-11-14 NOTE — Progress Notes (Signed)
Pt ready for discharge home after doppler results came back neg. Prescriptions and discharge instructions were reviewed with pt, all questions/concerns addressed. Cleared by PT/OT, has needed equipment at home. Waiting for transportation home via mom. Belongings packed and will be sent with pt.   [place], Jerry Caras

## 2015-12-25 ENCOUNTER — Encounter (HOSPITAL_COMMUNITY): Payer: Self-pay | Admitting: Emergency Medicine

## 2015-12-25 ENCOUNTER — Emergency Department (HOSPITAL_COMMUNITY): Payer: BLUE CROSS/BLUE SHIELD

## 2015-12-25 ENCOUNTER — Emergency Department (HOSPITAL_COMMUNITY)
Admission: EM | Admit: 2015-12-25 | Discharge: 2015-12-25 | Disposition: A | Discharge status: Home / Self Care | Payer: BLUE CROSS/BLUE SHIELD | Attending: Emergency Medicine | Admitting: Emergency Medicine

## 2015-12-25 DIAGNOSIS — Z91040 Latex allergy status: Secondary | ICD-10-CM | POA: Diagnosis not present

## 2015-12-25 DIAGNOSIS — J45909 Unspecified asthma, uncomplicated: Secondary | ICD-10-CM | POA: Insufficient documentation

## 2015-12-25 DIAGNOSIS — Z7982 Long term (current) use of aspirin: Secondary | ICD-10-CM | POA: Insufficient documentation

## 2015-12-25 DIAGNOSIS — R0789 Other chest pain: Secondary | ICD-10-CM | POA: Diagnosis present

## 2015-12-25 DIAGNOSIS — I10 Essential (primary) hypertension: Secondary | ICD-10-CM | POA: Insufficient documentation

## 2015-12-25 DIAGNOSIS — Z79899 Other long term (current) drug therapy: Secondary | ICD-10-CM | POA: Insufficient documentation

## 2015-12-25 DIAGNOSIS — Z87891 Personal history of nicotine dependence: Secondary | ICD-10-CM | POA: Diagnosis not present

## 2015-12-25 DIAGNOSIS — Z96653 Presence of artificial knee joint, bilateral: Secondary | ICD-10-CM | POA: Diagnosis not present

## 2015-12-25 DIAGNOSIS — R109 Unspecified abdominal pain: Secondary | ICD-10-CM | POA: Diagnosis not present

## 2015-12-25 DIAGNOSIS — R0781 Pleurodynia: Secondary | ICD-10-CM

## 2015-12-25 DIAGNOSIS — Z8673 Personal history of transient ischemic attack (TIA), and cerebral infarction without residual deficits: Secondary | ICD-10-CM | POA: Diagnosis not present

## 2015-12-25 LAB — COMPREHENSIVE METABOLIC PANEL
ALBUMIN: 3.8 g/dL (ref 3.5–5.0)
ALK PHOS: 169 U/L — AB (ref 38–126)
ALT: 19 U/L (ref 14–54)
AST: 23 U/L (ref 15–41)
Anion gap: 7 (ref 5–15)
BUN: 12 mg/dL (ref 6–20)
CALCIUM: 9.3 mg/dL (ref 8.9–10.3)
CO2: 25 mmol/L (ref 22–32)
CREATININE: 0.73 mg/dL (ref 0.44–1.00)
Chloride: 111 mmol/L (ref 101–111)
GFR calc Af Amer: >60 mL/min (ref >60)
GFR calc non Af Amer: >60 mL/min (ref >60)
GLUCOSE: 114 mg/dL — AB (ref 65–99)
Potassium: 3.3 mmol/L — ABNORMAL LOW (ref 3.5–5.1)
SODIUM: 143 mmol/L (ref 135–145)
Total Bilirubin: 0.2 mg/dL — ABNORMAL LOW (ref 0.3–1.2)
Total Protein: 6.6 g/dL (ref 6.5–8.1)

## 2015-12-25 LAB — URINALYSIS, ROUTINE W REFLEX MICROSCOPIC
BILIRUBIN URINE: NEGATIVE
Glucose, UA: NEGATIVE mg/dL
Hgb urine dipstick: NEGATIVE
KETONES UR: NEGATIVE mg/dL
Leukocytes, UA: NEGATIVE
NITRITE: NEGATIVE
PH: 6.5 (ref 5.0–8.0)
Protein, ur: NEGATIVE mg/dL
Specific Gravity, Urine: 1.011 (ref 1.005–1.030)

## 2015-12-25 LAB — CBC WITH DIFFERENTIAL/PLATELET
BASOS PCT: 0 %
Basophils Absolute: 0 10*3/uL (ref 0.0–0.1)
EOS ABS: 0.2 10*3/uL (ref 0.0–0.7)
EOS PCT: 3 %
HCT: 38.3 % (ref 36.0–46.0)
Hemoglobin: 11.8 g/dL — ABNORMAL LOW (ref 12.0–15.0)
Lymphocytes Relative: 24 %
Lymphs Abs: 1.7 10*3/uL (ref 0.7–4.0)
MCH: 27.6 pg (ref 26.0–34.0)
MCHC: 30.8 g/dL (ref 30.0–36.0)
MCV: 89.5 fL (ref 78.0–100.0)
MONO ABS: 0.4 10*3/uL (ref 0.1–1.0)
MONOS PCT: 5 %
Neutro Abs: 4.8 10*3/uL (ref 1.7–7.7)
Neutrophils Relative %: 68 %
Platelets: 231 10*3/uL (ref 150–400)
RBC: 4.28 MIL/uL (ref 3.87–5.11)
RDW: 14.4 % (ref 11.5–15.5)
WBC: 7 10*3/uL (ref 4.0–10.5)

## 2015-12-25 LAB — D-DIMER, QUANTITATIVE: D-Dimer, Quant: 3.18 ug/mL-FEU — ABNORMAL HIGH (ref 0.00–0.50)

## 2015-12-25 MED ORDER — LIDOCAINE 5 % EX PTCH: 1.0000 | MEDICATED_PATCH | CUTANEOUS | Status: DC

## 2015-12-25 MED ORDER — IOPAMIDOL (ISOVUE-370) INJECTION 76%
INTRAVENOUS | Status: AC
  Administered 2015-12-25: 100 mL
  Filled 2015-12-25: qty 100

## 2015-12-25 NOTE — ED Provider Notes (Signed)
CSN: [id number]     Arrival date & time 12/25/15  1004 History   First MD Initiated Contact with Patient 12/25/15 1023     Chief Complaint  Patient presents with  . Abdominal Pain  . Hematuria     (Consider location/radiation/quality/duration/timing/severity/associated sxs/prior Treatment) HPI  Wanda [name] Is a 50 year old female who presents emergency Department with chief complaint of dark urine and chest and back pain. The patient states that she has had intermittent, sharp pain under her right pain Breast that seems to come and go. It is pleuritic in nature. She describes it as sharp. She states that occasionally it radiates all the way through her chest to her posterior thoracic region on the right side. She denies a dermatomal distribution or rashes. She denies any previous injury to that area. She has no hemoptysis, cough, fevers. When the pain is present, she feels short of breath but denies constant shortness of breath. She is status post right total knee arthroplasty in the middle of April 2017. She has been doing well and has not noticed any new swelling. She denies a history of DVT or pulmonary embolus. She is status post cholecystectomy but denies nausea or vomiting. This morning she did notice some dark color of urine and was concerned she may have a kidney stone. She denies any other urinary symptoms..  Past Medical History  Diagnosis Date  . Depression   . Hypercholesterolemia     no current med.  . Anxiety   . History of blood transfusion   . Stroke ([place]) 10/27/2012    TIA   1- 10/29/11-weakness left side  . PONV (postoperative nausea and vomiting)     states severe  . Migraines   . History of TIA (transient ischemic attack)     x 2; left-sided weakness  . GERD (gastroesophageal reflux disease)   . Hypertension     states under control with meds., has been on med. since 06/2011  . Seasonal allergies   . Chronic bronchitis ([place])   . Family history of adverse  reaction to anesthesia     pt's mother has hx. of post-op N/V  . Acne   . Osteoarthritis   . Keloid [id number]    umbilical  . Anemia     takes iron supplement  . Asthma    Past Surgical History  Procedure Laterality Date  . Vaginal hysterectomy      for DUB in 10/2003; partial  . Knee arthroscopy Left 02/2000  . Total knee arthroplasty Left 12/31/2014    Procedure: TOTAL LEFT KNEE ARTHROPLASTY;  Surgeon: Frederik Pear, MD;  Location: [facility];  Service: Orthopedics;  Laterality: Left;  . Diagnostic laparoscopy  early 2005  . Cholecystectomy  08/27/2009  . Laminotomy Left 03/27/2010    L5-S1; microdissection  . Lumbar fusion Left 07/18/2010    L4-5; L5-S1  . Carpal tunnel release Left 12/2013  . Carpal tunnel release Right 03/29/2015  . Functional endoscopic sinus surgery  04/30/2006  . Ethmoidectomy Bilateral 04/30/2006  . Nasal turbinate reduction  04/30/2006    inferior  . Total abdominal hysterectomy  11/01/2003    with lysis of adhesions; ovaries retained  . Laminotomy Bilateral 07/12/2007    L4-5  . Hysteroscopy w/d&c  04/21/2002    with lysis of adhesions  . Lumbar fusion  04/10/2014    L3-4, L4-5, L5-S1  . Total knee arthroplasty Right 11/11/2015    Procedure: TOTAL KNEE ARTHROPLASTY;  Surgeon: Frederik Pear, MD;  Location:  [facility];  Service: Orthopedics;  Laterality: Right;   Family History  Problem Relation Age of Onset  . Cervical cancer Maternal Grandmother   . Cancer Maternal Grandmother     mouth  . Heart attack Maternal Grandmother 55  . Hypertension Brother   . Heart failure Brother     ICD  . Anesthesia problems Mother     post-op N/V   Social History  Substance Use Topics  . Smoking status: Former Smoker -- 0.00 packs/day    Quit date: 07/20/2004  . Smokeless tobacco: Never Used  . Alcohol Use: No   OB History    No data available     Review of Systems  Ten systems reviewed and are negative for acute change, except as noted in the HPI.    Allergies  Lisinopril;  Topamax; Latex; and Oxycodone-acetaminophen  Home Medications   Prior to Admission medications   Medication Sig Start Date End Date Taking? Authorizing Provider  albuterol (PROVENTIL HFA;VENTOLIN HFA) 108 (90 BASE) MCG/ACT inhaler Inhale into the lungs every 6 (six) hours as needed for wheezing or shortness of breath.   Yes Historical Provider, MD  albuterol (PROVENTIL) (5 MG/ML) 0.5% nebulizer solution Take 2.5 mg by nebulization every 6 (six) hours as needed for wheezing or shortness of breath.   Yes Historical Provider, MD  aspirin EC 325 MG tablet Take 1 tablet (325 mg total) by mouth 2 (two) times daily. 11/11/15  Yes Leighton Parody, PA-C  atenolol (TENORMIN) 50 MG tablet Take 50 mg by mouth daily.   Yes Historical Provider, MD  busPIRone (BUSPAR) 15 MG tablet Take 15 mg by mouth 2 (two) times daily.    Yes Historical Provider, MD  cetirizine (ZYRTEC) 10 MG chewable tablet Chew 10 mg by mouth daily.   Yes Historical Provider, MD  cyproheptadine (PERIACTIN) 4 MG tablet Take 8 mg by mouth 2 (two) times daily.   Yes Historical Provider, MD  Emollient (AQUA GLYCOLIC FACE) CREA Apply 1 application topically 2 (two) times daily.   Yes Historical Provider, MD  ferrous sulfate 325 (65 FE) MG tablet Take 325 mg by mouth 2 (two) times daily with a meal.   Yes Historical Provider, MD  Fluticasone-Salmeterol (ADVAIR) 250-50 MCG/DOSE AEPB Inhale 1 puff into the lungs 2 (two) times daily.   Yes Historical Provider, MD  gabapentin (NEURONTIN) 300 MG capsule Take 300 mg by mouth 2 (two) times daily.    Yes Historical Provider, MD  hydrochlorothiazide (MICROZIDE) 12.5 MG capsule Take 12.5 mg by mouth daily.   Yes Historical Provider, MD  HYDROmorphone (DILAUDID) 2 MG tablet Take 1 tablet (2 mg total) by mouth every 4 (four) hours as needed for severe pain. 11/11/15  Yes Leighton Parody, PA-C  methocarbamol (ROBAXIN) 500 MG tablet Take 1 tablet (500 mg total) by mouth 2 (two) times daily with a meal. 11/11/15   Yes Leighton Parody, PA-C  mometasone (NASONEX) 50 MCG/ACT nasal spray Place 2 sprays into the nose daily.   Yes Historical Provider, MD  montelukast (SINGULAIR) 10 MG tablet Take 10 mg by mouth at bedtime.   Yes Historical Provider, MD  ondansetron (ZOFRAN) 8 MG tablet Take 8 mg by mouth every 8 (eight) hours as needed for nausea or vomiting.   Yes Historical Provider, MD  pantoprazole (PROTONIX) 40 MG tablet Take 40 mg by mouth 2 (two) times daily.    Yes Historical Provider, MD  rosuvastatin (CRESTOR) 10 MG tablet Take 10 mg by mouth  daily.   Yes Historical Provider, MD  sertraline (ZOLOFT) 100 MG tablet Take 100-200 mg by mouth 2 (two) times daily. Take 100 mg in the am and 200 mg in the pm   Yes Historical Provider, MD  Soap & Cleansers (AQUA GLYCOLIC CLEANSER EX) Apply 1 application topically 2 (two) times daily.   Yes Historical Provider, MD  tiZANidine (ZANAFLEX) 4 MG tablet Take 4 mg by mouth 2 (two) times daily.   Yes Historical Provider, MD  Vitamin D, Ergocalciferol, (DRISDOL) 50000 UNITS CAPS Take 50,000 Units by mouth every 7 (seven) days. mondays   Yes Historical Provider, MD  zonisamide (ZONEGRAN) 100 MG capsule Take 200 mg by mouth 2 (two) times daily.   Yes Historical Provider, MD  lidocaine (LIDODERM) 5 % Place 1 patch onto the skin daily. Remove & Discard patch within 12 hours or as directed by MD 12/25/15   Margarita Mail, PA-C   BP 126/60 mmHg  Pulse 78  Temp(Src) 97.9 F (36.6 C) (Oral)  Resp 20  SpO2 100% Physical Exam  Constitutional: She is oriented to person, place, and time. She appears well-developed and well-nourished. No distress.  HENT:  Head: Normocephalic and atraumatic.  Eyes: Conjunctivae are normal. No scleral icterus.  Neck: Normal range of motion.  Cardiovascular: Normal rate, regular rhythm and normal heart sounds.  Exam reveals no gallop and no friction rub.   No murmur heard. Pulmonary/Chest: Effort normal and breath sounds normal. No respiratory  distress. She exhibits tenderness (tender to palpation. The lateral chest wall in the T6 nerve distribution just anterior to the midaxillary line. No rashes, no step-off or crepitus.).  Abdominal: Soft. Bowel sounds are normal. She exhibits no distension and no mass. There is no tenderness. There is no guarding.  Musculoskeletal:  Right knee with well healing midline surgical scar. No signs of infection. Good range of motion. No calf swelling or tenderness,  Neurological: She is alert and oriented to person, place, and time.  Skin: Skin is warm and dry. She is not diaphoretic.    ED Course  Procedures (including critical care time) Labs Review Labs Reviewed  D-DIMER, QUANTITATIVE (NOT AT [facility]) - Abnormal; Notable for the following:    D-Dimer, Quant 3.18 (*)    All other components within normal limits  COMPREHENSIVE METABOLIC PANEL - Abnormal; Notable for the following:    Potassium 3.3 (*)    Glucose, Bld 114 (*)    Alkaline Phosphatase 169 (*)    Total Bilirubin 0.2 (*)    All other components within normal limits  CBC WITH DIFFERENTIAL/PLATELET - Abnormal; Notable for the following:    Hemoglobin 11.8 (*)    All other components within normal limits  URINALYSIS, ROUTINE W REFLEX MICROSCOPIC (NOT AT [facility])    Imaging Review Ct Angio Chest Pe W/cm &/or Wo Cm  12/25/2015  CLINICAL DATA:  Right-sided chest and scapular pain. Blood in urine. Rule out pulmonary embolism, pleuritic chest pain. Recent right total knee replacement. EXAM: CT ANGIOGRAPHY CHEST WITH CONTRAST TECHNIQUE: Multidetector CT imaging of the chest was performed using the standard protocol during bolus administration of intravenous contrast. Multiplanar CT image reconstructions and MIPs were obtained to evaluate the vascular anatomy. CONTRAST:  100 mL Isovue 370 COMPARISON:  Chest CT 05/03/2013 FINDINGS: Mediastinum/Lymph Nodes: No evidence for pulmonary embolism. No significant pericardial fluid. No suspicious chest  lymphadenopathy. Normal caliber of the thoracic aorta. No evidence for an aortic dissection. Lungs/Pleura: The trachea and mainstem bronchi are patent. Mild atelectasis  or scarring along the medial right lower lobe associated with bone changes. No significant airspace disease or consolidation. Upper abdomen: No acute abnormality in the visualized upper abdominal structures. Musculoskeletal: Prominent osteophytes involving the thoracic vertebral bodies and some of the costovertebral junctions. This is most prominent along the right seventh rib. In addition, there is cortical thickening involving the posterior right seventh rib. The cortical thickening is new since 2014. Review of the MIP images confirms the above findings. IMPRESSION: Negative for pulmonary embolism. Cortical thickening of the right posterior seventh rib appears to be new. 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 Differential diagnosis would include fibrous dysplasia and metastatic disease in the appropriate clinical setting. This finding may be contributing to the patient's symptoms based on the history right-sided chest pain. Multilevel bridging osteophytes in the thoracic spine with degenerative changes along the right seventh costovertebral junction. Electronically Signed   By: Markus Daft M.D.   On: 12/25/2015 15:52   Dg Chest Port 1 View  12/25/2015  CLINICAL DATA:  Right upper quadrant abdominal pain extending to the right back beginning last night. EXAM: PORTABLE CHEST 1 VIEW COMPARISON:  09/17/2015 FINDINGS: Thoracic spondylosis. The lungs appear clear.  Cardiac and mediastinal contours normal. No pleural effusion identified. IMPRESSION: 1.  No active cardiopulmonary disease is radiographically apparent. 2. Thoracic spondylosis. Electronically Signed   By: Van Clines M.D.   On: 12/25/2015 11:57   I have personally reviewed and evaluated these images and lab results as part of my medical decision-making.   EKG  Interpretation None      MDM   Final diagnoses:  Chest wall pain    Patient with elevated d-dimer- likely secondary to her surgery. Scan negative of PE. 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  I have discussed the findngs with Dr. Anselm Pancoast who suggests a follow up bone scan. I have made an appointment with the patient's Physician Dr. Chapman Fitch and spoken with her CMA about the findings needing follow up . Patient will be discharged with close follow up. She has pain medication at home. Appears safe for discharge. Return precautions discussed,    Margarita Mail, PA-C 12/26/15 [place], MD 12/27/15 [phone]

## 2015-12-25 NOTE — ED Notes (Signed)
Pt taken to CT.

## 2015-12-25 NOTE — ED Notes (Signed)
Pt arrives with c/o sharp RUQ pain under breast, which radiates to R back. Reports that mild pain began last night. When she urinated last night at 0300, she noticed blood in urine. Denies n/v/d, cp or sob.

## 2015-12-25 NOTE — Discharge Instructions (Signed)

## 2015-12-25 NOTE — ED Notes (Signed)
The pt returned from c-t  Her pain is better but still hurts

## 2016-01-02 DIAGNOSIS — R938 Abnormal findings on diagnostic imaging of other specified body structures: Secondary | ICD-10-CM | POA: Diagnosis not present

## 2016-01-02 DIAGNOSIS — R319 Hematuria, unspecified: Secondary | ICD-10-CM | POA: Diagnosis not present

## 2016-01-02 DIAGNOSIS — R0781 Pleurodynia: Secondary | ICD-10-CM | POA: Diagnosis not present

## 2016-01-02 DIAGNOSIS — E876 Hypokalemia: Secondary | ICD-10-CM | POA: Diagnosis not present

## 2016-01-02 DIAGNOSIS — R772 Abnormality of alphafetoprotein: Secondary | ICD-10-CM | POA: Diagnosis not present

## 2016-01-10 ENCOUNTER — Encounter: Payer: Self-pay | Admitting: Hematology

## 2016-01-13 ENCOUNTER — Telehealth: Payer: Self-pay | Admitting: Hematology

## 2016-01-13 NOTE — Telephone Encounter (Signed)
Lt mess for pt regarding new pt referral. Verified address and insurance, referring provider aware of appt. Date/time

## 2016-01-16 DIAGNOSIS — R0781 Pleurodynia: Secondary | ICD-10-CM | POA: Diagnosis not present

## 2016-01-16 DIAGNOSIS — K219 Gastro-esophageal reflux disease without esophagitis: Secondary | ICD-10-CM | POA: Diagnosis not present

## 2016-01-21 DIAGNOSIS — R1013 Epigastric pain: Secondary | ICD-10-CM | POA: Diagnosis not present

## 2016-01-21 DIAGNOSIS — R197 Diarrhea, unspecified: Secondary | ICD-10-CM | POA: Diagnosis not present

## 2016-01-22 ENCOUNTER — Telehealth: Payer: Self-pay | Admitting: Hematology

## 2016-01-22 ENCOUNTER — Ambulatory Visit (HOSPITAL_BASED_OUTPATIENT_CLINIC_OR_DEPARTMENT_OTHER): Payer: BLUE CROSS/BLUE SHIELD

## 2016-01-22 ENCOUNTER — Ambulatory Visit (HOSPITAL_BASED_OUTPATIENT_CLINIC_OR_DEPARTMENT_OTHER): Payer: BLUE CROSS/BLUE SHIELD | Admitting: Hematology

## 2016-01-22 ENCOUNTER — Encounter: Payer: Self-pay | Admitting: Hematology

## 2016-01-22 VITALS — BP 116/51 | HR 88 | Temp 98.0°F | Resp 18 | Ht 66.0 in | Wt 248.8 lb

## 2016-01-22 DIAGNOSIS — D509 Iron deficiency anemia, unspecified: Secondary | ICD-10-CM

## 2016-01-22 DIAGNOSIS — M899 Disorder of bone, unspecified: Secondary | ICD-10-CM

## 2016-01-22 LAB — IRON AND TIBC
%SAT: 12 % — ABNORMAL LOW (ref 21–57)
IRON: 37 ug/dL — AB (ref 41–142)
TIBC: 315 ug/dL (ref 236–444)
UIBC: 278 ug/dL (ref 120–384)

## 2016-01-22 LAB — CBC & DIFF AND RETIC
BASO%: 0.4 % (ref 0.0–2.0)
Basophils Absolute: 0 10*3/uL (ref 0.0–0.1)
EOS ABS: 0.3 10*3/uL (ref 0.0–0.5)
EOS%: 4.1 % (ref 0.0–7.0)
HCT: 39.9 % (ref 34.8–46.6)
HEMOGLOBIN: 12.9 g/dL (ref 11.6–15.9)
IMMATURE RETIC FRACT: 9.6 % (ref 1.60–10.00)
LYMPH%: 24.7 % (ref 14.0–49.7)
MCH: 28.5 pg (ref 25.1–34.0)
MCHC: 32.3 g/dL (ref 31.5–36.0)
MCV: 88.3 fL (ref 79.5–101.0)
MONO#: 0.6 10*3/uL (ref 0.1–0.9)
MONO%: 8 % (ref 0.0–14.0)
NEUT%: 62.8 % (ref 38.4–76.8)
NEUTROS ABS: 4.9 10*3/uL (ref 1.5–6.5)
PLATELETS: 184 10*3/uL (ref 145–400)
RBC: 4.52 10*6/uL (ref 3.70–5.45)
RDW: 14.3 % (ref 11.2–14.5)
Retic %: 1.84 % (ref 0.70–2.10)
Retic Ct Abs: 83.17 10*3/uL (ref 33.70–90.70)
WBC: 7.7 10*3/uL (ref 3.9–10.3)
lymph#: 1.9 10*3/uL (ref 0.9–3.3)

## 2016-01-22 LAB — COMPREHENSIVE METABOLIC PANEL
ALBUMIN: 4 g/dL (ref 3.5–5.0)
ALK PHOS: 189 U/L — AB (ref 40–150)
ALT: 25 U/L (ref 0–55)
ANION GAP: 10 meq/L (ref 3–11)
AST: 26 U/L (ref 5–34)
BUN: 8.9 mg/dL (ref 7.0–26.0)
CALCIUM: 9.7 mg/dL (ref 8.4–10.4)
CO2: 29 mEq/L (ref 22–29)
Chloride: 106 mEq/L (ref 98–109)
Creatinine: 0.8 mg/dL (ref 0.6–1.1)
Glucose: 103 mg/dl (ref 70–140)
POTASSIUM: 3.8 meq/L (ref 3.5–5.1)
Sodium: 144 mEq/L (ref 136–145)
TOTAL PROTEIN: 7.6 g/dL (ref 6.4–8.3)

## 2016-01-22 LAB — FERRITIN: FERRITIN: 134 ng/mL (ref 9–269)

## 2016-01-22 NOTE — Telephone Encounter (Signed)
per po to sch pt appt-sent back to lab-gave pt copy of avs-gave contrast-adv [facility] will call to sch scan

## 2016-01-22 NOTE — Progress Notes (Signed)
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  HISTORY OF PRESENTING ILLNESS:  Wanda [name] is a wonderful 50 y.o. female who has been referred to [place] by Dr.FULP, CAMMIE, MD for evaluation and management of rib abnormality noted on the CT scan of the chest recently - concerning for possible metastatic disease.  Patient has a history of TIA,/complex migraine with left sided weakness in past, anxiety, depression, dyslipidemia, GERD, hypertension, severe osteoarthritis (has had 4 back surgeries) was seen in the emergency room on 12/25/2015 with some right sided chest pain that sounded somewhat pleuritic in nature as well as some back pain. No associated rash or injury. No overt cough, fever, hemoptysis or overt shortness of breath. She notes that she feels subjectively short of breath due to the pain. Had not had any previous history of DVT or pulmonary embolism.  In the ED the patient had a CTA of the chest on 12/25/2015 that was negative for pulmonary embolism. She was noted to have cortical thickening of the right posterior seventh rib which was new compared to previous CT scan on 05/03/2013. 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 She was noted to have significant multilevel bridging osteophytes in her thoracic spine with degenerative changes along the right seventh costovertebral junction. Recent labs have also shown an elevated alkaline phosphatase.   She reports having had issues with Iron deficiency anemia in early 2016 and reports having had an EGD/Colonoscopy with Dr Herbie Baltimore Bucin [facility])(i which she reports were normal. She notes that she was recommended a capsule  endoscopy which she has not yet scheduled. Currently is on oral iron replacement FESo4 1 tab po BID  Last MMG about 3 yrs ago -notes that she is due for another now and shall be scheduling with through her PCP. Hysterectomy in 2005.  Recently had bothersome diarrhea that has now resolved with immodium and probiotics. Notes some epigastric discomfort.  Has had RLE leg swelling in 10/2015 after her rt knee surgery and had an LE venous US which was neg for DVT.     MEDICAL HISTORY:  Past Medical History  Diagnosis Date  . Depression   . Hypercholesterolemia     no current med.  . Anxiety   . History of blood transfusion   . Stroke ([place]) 10/27/2012    TIA   1- 10/29/11-weakness left side  . PONV (postoperative nausea and vomiting)     states severe  . Migraines   . History of TIA (transient ischemic attack)     x 2; left-sided weakness  . GERD (gastroesophageal reflux disease)   . Hypertension     states under control with meds., has been on med. since 06/2011  . Seasonal allergies   . Chronic bronchitis ([place])   . Family history of adverse reaction to anesthesia     pt's mother has hx. of post-op N/V  . Acne   . Osteoarthritis   . Keloid 11/3662    umbilical  . Anemia     takes iron supplement  . Asthma     SURGICAL HISTORY: Past Surgical History  Procedure Laterality Date  . Vaginal hysterectomy      for DUB in 10/2003; partial  . Knee arthroscopy  Left 02/2000  . Total knee arthroplasty Left 12/31/2014    Procedure: TOTAL LEFT KNEE ARTHROPLASTY;  Surgeon: Frederik Pear, MD;  Location: [place];  Service: Orthopedics;  Laterality: Left;  . Diagnostic laparoscopy  early 2005  . Cholecystectomy  08/27/2009  . Laminotomy Left 03/27/2010    L5-S1; microdissection  . Lumbar fusion Left 07/18/2010    L4-5; L5-S1  . Carpal tunnel release Left 12/2013  . Carpal tunnel release Right 03/29/2015  . Functional endoscopic sinus surgery  04/30/2006  . Ethmoidectomy Bilateral 04/30/2006  . Nasal  turbinate reduction  04/30/2006    inferior  . Total abdominal hysterectomy  11/01/2003    with lysis of adhesions; ovaries retained  . Laminotomy Bilateral 07/12/2007    L4-5  . Hysteroscopy w/d&c  04/21/2002    with lysis of adhesions  . Lumbar fusion  04/10/2014    L3-4, L4-5, L5-S1  . Total knee arthroplasty Right 11/11/2015    Procedure: TOTAL KNEE ARTHROPLASTY;  Surgeon: Frederik Pear, MD;  Location: [place];  Service: Orthopedics;  Laterality: Right;    SOCIAL HISTORY: Social History   Social History  . Marital Status: Divorced    Spouse Name: N/A  . Number of Children: 2  . Years of Education: N/A   Occupational History  . health advisor     [employer]   Social History Main Topics  . Smoking status: Former Smoker -- 0.00 packs/day    Quit date: 07/20/2004  . Smokeless tobacco: Never Used  . Alcohol Use: No  . Drug Use: No  . Sexual Activity:    Partners: Male   Other Topics Concern  . Not on file   Social History Narrative   Lives in [place] with daughter      Servando Snare (c) [phone]   nigel       (c) [phone]    FAMILY HISTORY: Family History  Problem Relation Age of Onset  . Cervical cancer Maternal Grandmother   . Cancer Maternal Grandmother     mouth  . Heart attack Maternal Grandmother 55  . Hypertension Brother   . Heart failure Brother     ICD  . Anesthesia problems Mother     post-op N/V    ALLERGIES:  is allergic to lisinopril; topamax; latex; oxycodone-acetaminophen; and oxycodone-acetaminophen.  MEDICATIONS:  Current Outpatient Prescriptions  Medication Sig Dispense Refill  . albuterol (PROVENTIL HFA;VENTOLIN HFA) 108 (90 BASE) MCG/ACT inhaler Inhale into the lungs every 6 (six) hours as needed for wheezing or shortness of breath.    Marland Kitchen albuterol (PROVENTIL) (5 MG/ML) 0.5% nebulizer solution Take 2.5 mg by nebulization every 6 (six) hours as needed for wheezing or shortness of breath.    Marland Kitchen aspirin EC 325 MG tablet Take 1 tablet (325 mg  total) by mouth 2 (two) times daily. 30 tablet 0  . atenolol (TENORMIN) 50 MG tablet Take 50 mg by mouth daily.    . busPIRone (BUSPAR) 15 MG tablet Take 15 mg by mouth 2 (two) times daily.     . cetirizine (ZYRTEC) 10 MG chewable tablet Chew 10 mg by mouth daily.    . cyproheptadine (PERIACTIN) 4 MG tablet Take 8 mg by mouth 2 (two) times daily.    . Emollient (AQUA GLYCOLIC FACE) CREA Apply 1 application topically 2 (two) times daily.    . ferrous sulfate 325 (65 FE) MG tablet Take 325 mg by mouth 2 (two) times daily with a meal.    . Fluticasone-Salmeterol (ADVAIR) 250-50 MCG/DOSE  AEPB Inhale 1 puff into the lungs 2 (two) times daily.    Marland Kitchen gabapentin (NEURONTIN) 300 MG capsule Take 300 mg by mouth 2 (two) times daily.     . hydrochlorothiazide (MICROZIDE) 12.5 MG capsule Take 12.5 mg by mouth daily.    Marland Kitchen HYDROmorphone (DILAUDID) 2 MG tablet Take 1 tablet (2 mg total) by mouth every 4 (four) hours as needed for severe pain. 60 tablet 0  . lidocaine (LIDODERM) 5 % Place 1 patch onto the skin daily. Remove & Discard patch within 12 hours or as directed by MD 30 patch 0  . methocarbamol (ROBAXIN) 500 MG tablet Take 1 tablet (500 mg total) by mouth 2 (two) times daily with a meal. 60 tablet 0  . mometasone (NASONEX) 50 MCG/ACT nasal spray Place 2 sprays into the nose daily.    . montelukast (SINGULAIR) 10 MG tablet Take 10 mg by mouth at bedtime.    . ondansetron (ZOFRAN) 8 MG tablet Take 8 mg by mouth every 8 (eight) hours as needed for nausea or vomiting.    . pantoprazole (PROTONIX) 40 MG tablet Take 40 mg by mouth 2 (two) times daily.     . rosuvastatin (CRESTOR) 10 MG tablet Take 10 mg by mouth daily.    . sertraline (ZOLOFT) 100 MG tablet Take 100-200 mg by mouth 2 (two) times daily. Take 100 mg in the am and 200 mg in the pm    . tiZANidine (ZANAFLEX) 4 MG tablet Take 4 mg by mouth 2 (two) times daily.    . Vitamin D, Ergocalciferol, (DRISDOL) 50000 UNITS CAPS Take 50,000 Units by mouth  every 7 (seven) days. mondays    . zonisamide (ZONEGRAN) 100 MG capsule Take 200 mg by mouth 2 (two) times daily.     No current facility-administered medications for this visit.    REVIEW OF SYSTEMS:    10 Point review of Systems was done is negative except as noted above.  PHYSICAL EXAMINATION: ECOG PERFORMANCE STATUS: 2 - Symptomatic, <50% confined to bed  . Filed Vitals:   01/22/16 1052  BP: 116/51  Pulse: 88  Temp: 98 F (36.7 C)  Resp: 18   Filed Weights   01/22/16 1052  Weight: 248 lb 12.8 oz (112.855 kg)   .Body mass index is 40.18 kg/(m^2).  GENERAL:alert, in no acute distress and comfortable SKIN: skin color, texture, turgor are normal, no rashes or significant lesions EYES: normal, conjunctiva are pink and non-injected, sclera clear OROPHARYNX:no exudate, no erythema and lips, buccal mucosa, and tongue normal  NECK: supple, no JVD, thyroid normal size, non-tender, without nodularity LYMPH:  no palpable lymphadenopathy in the cervical, axillary or inguinal LUNGS: clear to auscultation with normal respiratory effort, TTP over the rt posterior lower chest wall. HEART: regular rate & rhythm,  no murmurs and no lower extremity edema ABDOMEN: abdomen soft, non-tender, normoactive bowel sounds  Musculoskeletal: no cyanosis of digits and no clubbing  PSYCH: alert & oriented x 3 with fluent speech NEURO: no focal motor/sensory deficits  LABORATORY DATA:  I have reviewed the data as listed  . CBC Latest Ref Rng 12/25/2015 11/14/2015 11/13/2015  WBC 4.0 - 10.5 K/uL 7.0 9.7 10.6(H)  Hemoglobin 12.0 - 15.0 g/dL 11.8(L) 9.2(L) 9.6(L)  Hematocrit 36.0 - 46.0 % 38.3 30.1(L) 30.3(L)  Platelets 150 - 400 K/uL 231 172 167   . CBC    Component Value Date/Time   WBC 7.0 12/25/2015 1115   RBC 4.28 12/25/2015 1115   HGB  11.8* 12/25/2015 1115   HCT 38.3 12/25/2015 1115   PLT 231 12/25/2015 1115   MCV 89.5 12/25/2015 1115   MCH 27.6 12/25/2015 1115   MCHC 30.8 12/25/2015  1115   RDW 14.4 12/25/2015 1115   LYMPHSABS 1.7 12/25/2015 1115   MONOABS 0.4 12/25/2015 1115   EOSABS 0.2 12/25/2015 1115   BASOSABS 0.0 12/25/2015 1115     . CMP Latest Ref Rng 01/22/2016 01/22/2016 12/25/2015  Glucose 70 - 140 mg/dl 103 - 114(H)  BUN 7.0 - 26.0 mg/dL 8.9 - 12  Creatinine 0.6 - 1.1 mg/dL 0.8 - 0.73  Sodium 136 - 145 mEq/L 144 - 143  Potassium 3.5 - 5.1 mEq/L 3.8 - 3.3(L)  Chloride 101 - 111 mmol/L - - 111  CO2 22 - 29 mEq/L 29 - 25  Calcium 8.4 - 10.4 mg/dL 9.7 - 9.3  Total Protein 6.0 - 8.5 g/dL 7.6 6.9 6.6  Total Bilirubin 0.20 - 1.20 mg/dL <0.30 - 0.2(L)  Alkaline Phos 40 - 150 U/L 189(H) - 169(H)  AST 5 - 34 U/L 26 - 23  ALT 0 - 55 U/L 25 - 19   Component     Latest Ref Rng 01/22/2016  IgG (Immunoglobin G), Serum     700 - 1600 mg/dL 881  IgA/Immunoglobulin A, Serum     87 - 352 mg/dL 147  IgM, Qn, Serum     26 - 217 mg/dL 88  Total Protein     6.0 - 8.5 g/dL 6.9  Albumin SerPl Elph-Mcnc     2.9 - 4.4 g/dL 3.7  Alpha 1     0.0 - 0.4 g/dL 0.3  Alpha2 Glob SerPl Elph-Mcnc     0.4 - 1.0 g/dL 0.7  B-Globulin SerPl Elph-Mcnc     0.7 - 1.3 g/dL 1.2  Gamma Glob SerPl Elph-Mcnc     0.4 - 1.8 g/dL 1.0  M Protein SerPl Elph-Mcnc     Not Observed g/dL Not Observed  Globulin, Total     2.2 - 3.9 g/dL 3.2  Albumin/Glob SerPl     0.7 - 1.7 1.2  IFE 1      Comment  Please Note (HCV):      Comment  Iron     41 - 142 ug/dL 37 (L)  TIBC     236 - 444 ug/dL 315  UIBC     120 - 384 ug/dL 278  %SAT     21 - 57 % 12 (L)  Ig Kappa Free Light Chain     3.3 - 19.4 mg/L 17.9  Ig Lambda Free Light Chain     5.7 - 26.3 mg/L 9.6  Kappa/Lambda FluidC Ratio     0.26 - 1.65 1.86 (H)  Sed Rate     0 - 40 mm/hr 9  CRP     0.0 - 4.9 mg/L 4.9  Ferritin     9 - 269 ng/ml 134    RADIOGRAPHIC STUDIES: I have personally reviewed the radiological images as listed and agreed with the findings in the report. Ct Angio Chest Pe W/cm &/or Wo Cm  12/25/2015   CLINICAL DATA:  Right-sided chest and scapular pain. Blood in urine. Rule out pulmonary embolism, pleuritic chest pain. Recent right total knee replacement. EXAM: CT ANGIOGRAPHY CHEST WITH CONTRAST TECHNIQUE: Multidetector CT imaging of the chest was performed using the standard protocol during bolus administration of intravenous contrast. Multiplanar CT image reconstructions and MIPs were obtained to evaluate the vascular anatomy. CONTRAST:  100 mL Isovue 370 COMPARISON:  Chest CT 05/03/2013 FINDINGS: Mediastinum/Lymph Nodes: No evidence for pulmonary embolism. No significant pericardial fluid. No suspicious chest lymphadenopathy. Normal caliber of the thoracic aorta. No evidence for an aortic dissection. Lungs/Pleura: The trachea and mainstem bronchi are patent. Mild atelectasis or scarring along the medial right lower lobe associated with bone changes. No significant airspace disease or consolidation. Upper abdomen: No acute abnormality in the visualized upper abdominal structures. Musculoskeletal: Prominent osteophytes involving the thoracic vertebral bodies and some of the costovertebral junctions. This is most prominent along the right seventh rib. In addition, there is cortical thickening involving the posterior right seventh rib. The cortical thickening is new since 2014. Review of the MIP images confirms the above findings. IMPRESSION: Negative for pulmonary embolism. Cortical thickening of the right posterior seventh rib appears to be new. 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 Differential diagnosis would include fibrous dysplasia and metastatic disease in the appropriate clinical setting. This finding may be contributing to the patient's symptoms based on the history right-sided chest pain. Multilevel bridging osteophytes in the thoracic spine with degenerative changes along the right seventh costovertebral junction. Electronically Signed   By: Markus Daft M.D.   On: 12/25/2015 15:52     Dg Chest Port 1 View  12/25/2015  CLINICAL DATA:  Right upper quadrant abdominal pain extending to the right back beginning last night. EXAM: PORTABLE CHEST 1 VIEW COMPARISON:  09/17/2015 FINDINGS: Thoracic spondylosis. The lungs appear clear.  Cardiac and mediastinal contours normal. No pleural effusion identified. IMPRESSION: 1.  No active cardiopulmonary disease is radiographically apparent. 2. Thoracic spondylosis. Electronically Signed   By: Van Clines M.D.   On: 12/25/2015 11:57    ASSESSMENT & PLAN:   50 yo with   1) Rt chest wall pain with CTA chest showing Cortical thickening of the right posterior seventh rib which appears to be new. In the setting of elevated alkaline phosphatase levels this could certain represent pagets disease. Myeloma panel unrevealing with no M protein spike, SFLC wnl Though less likely cannot r/o metastatic disease. Part of the pain could be from thoracic radiculopathy related to significant osteoarthritis of her t-spine. Plan -we shall complete her workup for bone pathology with a CT abd/pelvis to r/o metastatic disease especially since she is also having some upper abdominal discomfort. -we shall also get a whole body bone scan to evaluate for additional bone pathology that might direct diagnosis/treatment. -continue age appropriate cancer screening with PCP (due for MMG)  2) h/o Iron deficiency anemia - ferritin currently 134 and hgb near normal at 11.8 with normal MCV. Element of blood loss anemia related to her knee surgery in April 2017 and last year. Patient has been on FESO4 1 tab po BID which has corrected most of her anemia. Plan -she reports that she was recommended a capsule endoscopy and should f/u with GI recommendations. -might reduce FESO4 to 1 tab po daily and continue till iron sat close to 30% . -reasonable to take a daily multivitamin.  Continue f/u with PCP  RTC with Dr Irene Limbo in 4 weeks with CT abd/pelvis and WB bone  scan  . Orders Placed This Encounter  Procedures  . CT ABDOMEN PELVIS W CONTRAST    Standing Status: Future     Number of Occurrences:      Standing Expiration Date: 04/23/2017    Order Specific Question:  Reason for exam:    Answer:  bone lesion ? metastatic disease v/s pagets  bone disease    Order Specific Question:  Is the patient pregnant?    Answer:  No    Order Specific Question:  Preferred imaging location?    Answer:  [facility]    Standing Status: Future     Number of Occurrences:      Standing Expiration Date: 01/21/2017    Order Specific Question:  Reason for Exam (SYMPTOM  OR DIAGNOSIS REQUIRED)    Answer:  abnormal thickening of ribs ? paget disease v/s metastatic lesion    Order Specific Question:  Is the patient pregnant?    Answer:  No    Order Specific Question:  Preferred imaging location?    Answer:  [facility]    Order Specific Question:  If indicated for the ordered procedure, I authorize the administration of a radiopharmaceutical per Radiology protocol    Answer:  Yes  . CBC & Diff and Retic    Standing Status: Future     Number of Occurrences: 1     Standing Expiration Date: 01/21/2017  . Comprehensive metabolic panel    Standing Status: Future     Number of Occurrences: 1     Standing Expiration Date: 01/21/2017  . Sedimentation rate    Standing Status: Future     Number of Occurrences: 1     Standing Expiration Date: 01/21/2017  . C-reactive protein    Standing Status: Future     Number of Occurrences: 1     Standing Expiration Date: 01/21/2017  . Multiple Myeloma Panel (SPEP&IFE w/QIG)    Standing Status: Future     Number of Occurrences: 1     Standing Expiration Date: 01/21/2017  . Kappa/lambda light chains    Standing Status: Future     Number of Occurrences: 1     Standing Expiration Date: 02/25/2017  . Ferritin    Standing Status: Future     Number of Occurrences: 1     Standing Expiration  Date: 01/21/2017  . Iron and TIBC    Standing Status: Future     Number of Occurrences: 1     Standing Expiration Date: 01/21/2017     All of the patients questions were answered with apparent satisfaction. The patient knows to call the clinic with any problems, questions or concerns.  I spent 60 minutes counseling the patient face to face. The total time spent in the appointment was 60 minutes and more than 50% was on counseling and direct patient cares.    Sullivan Lone MD [place] [facility] [employer] [facility] Hematology/Oncology Physician [facility]  (Office):       [phone] (Work cell):  [phone] (Fax):           [phone]  01/22/2016 12:02 PM

## 2016-01-23 LAB — C-REACTIVE PROTEIN: CRP: 4.9 mg/L (ref 0.0–4.9)

## 2016-01-23 LAB — KAPPA/LAMBDA LIGHT CHAINS
Ig Kappa Free Light Chain: 17.9 mg/L (ref 3.3–19.4)
Ig Lambda Free Light Chain: 9.6 mg/L (ref 5.7–26.3)
Kappa/Lambda FluidC Ratio: 1.86 — ABNORMAL HIGH (ref 0.26–1.65)

## 2016-01-23 LAB — SEDIMENTATION RATE: SED RATE: 9 mm/h (ref 0–40)

## 2016-01-24 ENCOUNTER — Other Ambulatory Visit: Payer: Self-pay | Admitting: Family Medicine

## 2016-01-24 DIAGNOSIS — Z1231 Encounter for screening mammogram for malignant neoplasm of breast: Secondary | ICD-10-CM

## 2016-01-24 LAB — MULTIPLE MYELOMA PANEL, SERUM
ALBUMIN/GLOB SERPL: 1.2 (ref 0.7–1.7)
ALPHA 1: 0.3 g/dL (ref 0.0–0.4)
ALPHA2 GLOB SERPL ELPH-MCNC: 0.7 g/dL (ref 0.4–1.0)
Albumin SerPl Elph-Mcnc: 3.7 g/dL (ref 2.9–4.4)
B-GLOBULIN SERPL ELPH-MCNC: 1.2 g/dL (ref 0.7–1.3)
Gamma Glob SerPl Elph-Mcnc: 1 g/dL (ref 0.4–1.8)
Globulin, Total: 3.2 g/dL (ref 2.2–3.9)
IGG (IMMUNOGLOBIN G), SERUM: 881 mg/dL (ref 700–1600)
IgA, Qn, Serum: 147 mg/dL (ref 87–352)
IgM, Qn, Serum: 88 mg/dL (ref 26–217)
TOTAL PROTEIN: 6.9 g/dL (ref 6.0–8.5)

## 2016-01-27 ENCOUNTER — Ambulatory Visit
Admission: RE | Admit: 2016-01-27 | Discharge: 2016-01-27 | Disposition: A | Discharge status: Home / Self Care | Payer: Medicare HMO | Source: Ambulatory Visit | Attending: Family Medicine | Admitting: Family Medicine

## 2016-01-27 DIAGNOSIS — Z1231 Encounter for screening mammogram for malignant neoplasm of breast: Secondary | ICD-10-CM

## 2016-02-05 ENCOUNTER — Other Ambulatory Visit: Payer: Self-pay | Admitting: *Deleted

## 2016-02-10 ENCOUNTER — Encounter (HOSPITAL_COMMUNITY)
Admission: RE | Admit: 2016-02-10 | Discharge: 2016-02-10 | Disposition: A | Discharge status: Home / Self Care | Payer: Medicare HMO | Source: Ambulatory Visit | Attending: Hematology | Admitting: Hematology

## 2016-02-10 DIAGNOSIS — M899 Disorder of bone, unspecified: Secondary | ICD-10-CM

## 2016-02-10 MED ORDER — TECHNETIUM TC 99M MEDRONATE IV KIT
25.3000 | PACK | Freq: Once | INTRAVENOUS | Status: AC | PRN
  Administered 2016-02-10: 25.3 via INTRAVENOUS

## 2016-02-12 ENCOUNTER — Ambulatory Visit (HOSPITAL_COMMUNITY)
Admission: RE | Admit: 2016-02-12 | Discharge: 2016-02-12 | Disposition: A | Discharge status: Home / Self Care | Payer: Medicare HMO | Source: Ambulatory Visit | Attending: Hematology | Admitting: Hematology

## 2016-02-12 ENCOUNTER — Encounter (HOSPITAL_COMMUNITY): Payer: Self-pay

## 2016-02-12 DIAGNOSIS — R590 Localized enlarged lymph nodes: Secondary | ICD-10-CM | POA: Diagnosis not present

## 2016-02-12 DIAGNOSIS — I700 Atherosclerosis of aorta: Secondary | ICD-10-CM | POA: Diagnosis not present

## 2016-02-12 DIAGNOSIS — M899 Disorder of bone, unspecified: Secondary | ICD-10-CM | POA: Insufficient documentation

## 2016-02-12 MED ORDER — IOPAMIDOL (ISOVUE-300) INJECTION 61%
100.0000 mL | Freq: Once | INTRAVENOUS | Status: AC | PRN
  Administered 2016-02-12: 100 mL via INTRAVENOUS

## 2016-02-17 ENCOUNTER — Ambulatory Visit (HOSPITAL_BASED_OUTPATIENT_CLINIC_OR_DEPARTMENT_OTHER): Payer: Medicare HMO | Admitting: Hematology

## 2016-02-17 ENCOUNTER — Encounter: Payer: Self-pay | Admitting: Hematology

## 2016-02-17 ENCOUNTER — Telehealth: Payer: Self-pay | Admitting: Hematology

## 2016-02-17 VITALS — BP 127/45 | HR 93 | Temp 99.5°F | Resp 18 | Ht 66.0 in | Wt 225.7 lb

## 2016-02-17 DIAGNOSIS — M899 Disorder of bone, unspecified: Secondary | ICD-10-CM

## 2016-02-17 DIAGNOSIS — M8938 Hypertrophy of bone, other site: Secondary | ICD-10-CM

## 2016-02-17 DIAGNOSIS — R748 Abnormal levels of other serum enzymes: Secondary | ICD-10-CM

## 2016-02-17 DIAGNOSIS — D509 Iron deficiency anemia, unspecified: Secondary | ICD-10-CM

## 2016-02-17 DIAGNOSIS — M889 Osteitis deformans of unspecified bone: Secondary | ICD-10-CM

## 2016-02-17 NOTE — Telephone Encounter (Signed)
Per desk nurse patient does not need to return to lab today,

## 2016-02-17 NOTE — Telephone Encounter (Signed)
Gave patient avs report and appointments for September.  °

## 2016-02-17 NOTE — Progress Notes (Signed)
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  HISTORY OF PRESENTING ILLNESS:  Wanda [name] is a wonderful 50 y.o. female who has been referred to [place] by Dr.FULP, CAMMIE, MD for evaluation and management of rib abnormality noted on the CT scan of the chest recently - concerning for possible metastatic disease.  Patient has a history of TIA,/complex migraine with left sided weakness in past, anxiety, depression, dyslipidemia, GERD, hypertension, severe osteoarthritis (has had 4 back surgeries) was seen in the emergency room on 12/25/2015 with some right sided chest pain that sounded somewhat pleuritic in nature as well as some back pain. No associated rash or injury. No overt cough, fever, hemoptysis or overt shortness of breath. She notes that she feels subjectively short of breath due to the pain. Had not had any previous history of DVT or pulmonary embolism.  In the ED the patient had a CTA of the chest on 12/25/2015 that was negative for pulmonary embolism. She was noted to have cortical thickening of the right posterior seventh rib which was new compared to previous CT scan on 05/03/2013. 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 She was noted to have significant multilevel bridging osteophytes in her thoracic spine with degenerative changes along the right seventh costovertebral junction. Recent labs have also shown an elevated alkaline phosphatase.   She reports having had issues with Iron deficiency anemia in early 2016 and reports having had an EGD/Colonoscopy with Dr Herbie Baltimore [facility]i which she reports were normal. She notes that she was recommended a capsule  endoscopy which she has not yet scheduled. Currently is on oral iron replacement FESo4 1 tab po BID  Last MMG about 3 yrs ago -notes that she is due for another now and shall be scheduling with through her PCP. Hysterectomy in 2005.  Recently had bothersome diarrhea that has now resolved with immodium and probiotics. Notes some epigastric discomfort.  Has had RLE leg swelling in 10/2015 after her rt knee surgery and had an LE venous US which was neg for DVT.   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 She still notes some focal tenderness to palpation or her right chest wall. Pain in this area tends to backs and wane. No overt back pain in the area. CT of the abdomen and pelvis shows no overt evidence of metastatic disease. Bone scan does light up this area of the rib over the right posterior seventh rib. Some nonspecific uptake around the right knee likely related to her previous surgery and her right ankle related to her recent fall with significant pain and swelling. Patient recently had a fall over laundry basket and had requested a right ankle with suspicions of a possible fracture. She has been seen by orthopedics and her right foot is in a pneumatic boot. Recent mammogram on 01/27/2016 did not show any concerning lesions.     MEDICAL HISTORY:  Past Medical History:  Diagnosis Date  . Acne   . Anemia    takes iron supplement  . Anxiety   . Asthma   . Chronic bronchitis ([place])   . Depression   . Family history of adverse reaction to anesthesia  pt's mother has hx. of post-op N/V  . GERD (gastroesophageal reflux disease)   . History of blood transfusion   . History of TIA (transient ischemic attack)    x 2; left-sided weakness  . Hypercholesterolemia    no current med.  . Hypertension    states under control with meds., has been on med. since 06/2011  . Keloid [id number]   umbilical  . Migraines   . Osteoarthritis   . PONV  (postoperative nausea and vomiting)    states severe  . Seasonal allergies   . Stroke ([place]) 10/27/2012   TIA   1- 10/29/11-weakness left side    SURGICAL HISTORY: Past Surgical History:  Procedure Laterality Date  . CARPAL TUNNEL RELEASE Left 12/2013  . CARPAL TUNNEL RELEASE Right 03/29/2015  . CHOLECYSTECTOMY  08/27/2009  . DIAGNOSTIC LAPAROSCOPY  early 2005  . ETHMOIDECTOMY Bilateral 04/30/2006  . FUNCTIONAL ENDOSCOPIC SINUS SURGERY  04/30/2006  . HYSTEROSCOPY W/D&C  04/21/2002   with lysis of adhesions  . KNEE ARTHROSCOPY Left 02/2000  . LAMINOTOMY Left 03/27/2010   L5-S1; microdissection  . LAMINOTOMY Bilateral 07/12/2007   L4-5  . LUMBAR FUSION Left 07/18/2010   L4-5; L5-S1  . LUMBAR FUSION  04/10/2014   L3-4, L4-5, L5-S1  . NASAL TURBINATE REDUCTION  04/30/2006   inferior  . TOTAL ABDOMINAL HYSTERECTOMY  11/01/2003   with lysis of adhesions; ovaries retained  . TOTAL KNEE ARTHROPLASTY Left 12/31/2014   Procedure: TOTAL LEFT KNEE ARTHROPLASTY;  Surgeon: Frederik Pear, MD;  Location: [facility];  Service: Orthopedics;  Laterality: Left;  . TOTAL KNEE ARTHROPLASTY Right 11/11/2015   Procedure: TOTAL KNEE ARTHROPLASTY;  Surgeon: Frederik Pear, MD;  Location: [facility];  Service: Orthopedics;  Laterality: Right;  Marland Kitchen VAGINAL HYSTERECTOMY     for DUB in 10/2003; partial    SOCIAL HISTORY: Social History   Social History  . Marital status: Divorced    Spouse name: N/A  . Number of children: 2  . Years of education: N/A   Occupational History  . health advisor Unemployed    [employer]   Social History Main Topics  . Smoking status: Former Smoker    Packs/day: 0.00    Quit date: 07/20/2004  . Smokeless tobacco: Never Used  . Alcohol use No  . Drug use: No  . Sexual activity: Yes    Partners: Male   Other Topics Concern  . Not on file   Social History Narrative   Lives in [place] with daughter      Servando Snare (c) [phone]   nigel       (c) [phone]    FAMILY HISTORY: Family  History  Problem Relation Age of Onset  . Anesthesia problems Mother     post-op N/V  . Cervical cancer Maternal Grandmother   . Cancer Maternal Grandmother     mouth  . Heart attack Maternal Grandmother 55  . Hypertension Brother   . Heart failure Brother     ICD    ALLERGIES:  is allergic to lisinopril; topamax [topiramate]; latex; oxycodone-acetaminophen; and oxycodone-acetaminophen.  MEDICATIONS:  Current Outpatient Prescriptions  Medication Sig Dispense Refill  . albuterol (PROVENTIL HFA;VENTOLIN HFA) 108 (90 BASE) MCG/ACT inhaler Inhale into the lungs every 6 (six) hours as needed for wheezing or shortness of breath.    Marland Kitchen albuterol (PROVENTIL) (5 MG/ML) 0.5% nebulizer solution Take 2.5 mg by nebulization every 6 (six) hours as needed for wheezing or shortness of breath.    Marland Kitchen aspirin EC  325 MG tablet Take 1 tablet (325 mg total) by mouth 2 (two) times daily. 30 tablet 0  . atenolol (TENORMIN) 50 MG tablet Take 50 mg by mouth daily.    . busPIRone (BUSPAR) 15 MG tablet Take 15 mg by mouth 2 (two) times daily.     . cetirizine (ZYRTEC) 10 MG chewable tablet Chew 10 mg by mouth daily.    . cyproheptadine (PERIACTIN) 4 MG tablet Take 8 mg by mouth 2 (two) times daily.    . Emollient (AQUA GLYCOLIC FACE) CREA Apply 1 application topically 2 (two) times daily.    . ferrous sulfate 325 (65 FE) MG tablet Take 325 mg by mouth 2 (two) times daily with a meal.    . Fluticasone-Salmeterol (ADVAIR) 250-50 MCG/DOSE AEPB Inhale 1 puff into the lungs 2 (two) times daily.    Marland Kitchen gabapentin (NEURONTIN) 300 MG capsule Take 300 mg by mouth 2 (two) times daily.     . hydrochlorothiazide (MICROZIDE) 12.5 MG capsule Take 12.5 mg by mouth daily.    Marland Kitchen HYDROmorphone (DILAUDID) 2 MG tablet Take 1 tablet (2 mg total) by mouth every 4 (four) hours as needed for severe pain. 60 tablet 0  . lidocaine (LIDODERM) 5 % Place 1 patch onto the skin daily. Remove & Discard patch within 12 hours or as directed by MD 30  patch 0  . methocarbamol (ROBAXIN) 500 MG tablet Take 1 tablet (500 mg total) by mouth 2 (two) times daily with a meal. 60 tablet 0  . mometasone (NASONEX) 50 MCG/ACT nasal spray Place 2 sprays into the nose daily.    . montelukast (SINGULAIR) 10 MG tablet Take 10 mg by mouth at bedtime.    . ondansetron (ZOFRAN) 8 MG tablet Take 8 mg by mouth every 8 (eight) hours as needed for nausea or vomiting.    . pantoprazole (PROTONIX) 40 MG tablet Take 40 mg by mouth 2 (two) times daily.     . rosuvastatin (CRESTOR) 10 MG tablet Take 10 mg by mouth daily.    . sertraline (ZOLOFT) 100 MG tablet Take 100-200 mg by mouth 2 (two) times daily. Take 100 mg in the am and 200 mg in the pm    . tiZANidine (ZANAFLEX) 4 MG tablet Take 4 mg by mouth 2 (two) times daily.    . Vitamin D, Ergocalciferol, (DRISDOL) 50000 UNITS CAPS Take 50,000 Units by mouth every 7 (seven) days. mondays    . zonisamide (ZONEGRAN) 100 MG capsule Take 200 mg by mouth 2 (two) times daily.     No current facility-administered medications for this visit.     REVIEW OF SYSTEMS:    10 Point review of Systems was done is negative except as noted above.  PHYSICAL EXAMINATION: ECOG PERFORMANCE STATUS: 2 - Symptomatic, <50% confined to bed  . Vitals:   02/17/16 1009  BP: (!) 127/45  Pulse: 93  Resp: 18  Temp: 99.5 F (37.5 C)   Filed Weights   02/17/16 1009  Weight: 225 lb 11.2 oz (102.4 kg)   .Body mass index is 36.43 kg/m.  GENERAL:alert, in no acute distress and comfortable SKIN: skin color, texture, turgor are normal, no rashes or significant lesions EYES: normal, conjunctiva are pink and non-injected, sclera clear OROPHARYNX:no exudate, no erythema and lips, buccal mucosa, and tongue normal  NECK: supple, no JVD, thyroid normal size, non-tender, without nodularity LYMPH:  no palpable lymphadenopathy in the cervical, axillary or inguinal LUNGS: clear to auscultation with normal respiratory effort,  TTP over the rt  posterior lower chest wall. HEART: regular rate & rhythm,  no murmurs and no lower extremity edema ABDOMEN: abdomen soft, non-tender, normoactive bowel sounds  Musculoskeletal: no cyanosis of digits and no clubbing  PSYCH: alert & oriented x 3 with fluent speech NEURO: no focal motor/sensory deficits  LABORATORY DATA:  I have reviewed the data as listed  . CBC Latest Ref Rng & Units 01/22/2016 12/25/2015 11/14/2015  WBC 3.9 - 10.3 10e3/uL 7.7 7.0 9.7  Hemoglobin 11.6 - 15.9 g/dL 12.9 11.8(L) 9.2(L)  Hematocrit 34.8 - 46.6 % 39.9 38.3 30.1(L)  Platelets 145 - 400 10e3/uL 184 231 172   . CBC    Component Value Date/Time   WBC 7.7 01/22/2016 1309   WBC 7.0 12/25/2015 1115   RBC 4.52 01/22/2016 1309   RBC 4.28 12/25/2015 1115   HGB 12.9 01/22/2016 1309   HCT 39.9 01/22/2016 1309   PLT 184 01/22/2016 1309   MCV 88.3 01/22/2016 1309   MCH 28.5 01/22/2016 1309   MCH 27.6 12/25/2015 1115   MCHC 32.3 01/22/2016 1309   MCHC 30.8 12/25/2015 1115   RDW 14.3 01/22/2016 1309   LYMPHSABS 1.9 01/22/2016 1309   MONOABS 0.6 01/22/2016 1309   EOSABS 0.3 01/22/2016 1309   BASOSABS 0.0 01/22/2016 1309     . CMP Latest Ref Rng & Units 01/22/2016 01/22/2016 12/25/2015  Glucose 70 - 140 mg/dl 103 - 114(H)  BUN 7.0 - 26.0 mg/dL 8.9 - 12  Creatinine 0.6 - 1.1 mg/dL 0.8 - 0.73  Sodium 136 - 145 mEq/L 144 - 143  Potassium 3.5 - 5.1 mEq/L 3.8 - 3.3(L)  Chloride 101 - 111 mmol/L - - 111  CO2 22 - 29 mEq/L 29 - 25  Calcium 8.4 - 10.4 mg/dL 9.7 - 9.3  Total Protein 6.0 - 8.5 g/dL 7.6 6.9 6.6  Total Bilirubin 0.20 - 1.20 mg/dL <0.30 - 0.2(L)  Alkaline Phos 40 - 150 U/L 189(H) - 169(H)  AST 5 - 34 U/L 26 - 23  ALT 0 - 55 U/L 25 - 19   Component     Latest Ref Rng 01/22/2016  IgG (Immunoglobin G), Serum     700 - 1600 mg/dL 881  IgA/Immunoglobulin A, Serum     87 - 352 mg/dL 147  IgM, Qn, Serum     26 - 217 mg/dL 88  Total Protein     6.0 - 8.5 g/dL 6.9  Albumin SerPl Elph-Mcnc     2.9 -  4.4 g/dL 3.7  Alpha 1     0.0 - 0.4 g/dL 0.3  Alpha2 Glob SerPl Elph-Mcnc     0.4 - 1.0 g/dL 0.7  B-Globulin SerPl Elph-Mcnc     0.7 - 1.3 g/dL 1.2  Gamma Glob SerPl Elph-Mcnc     0.4 - 1.8 g/dL 1.0  M Protein SerPl Elph-Mcnc     Not Observed g/dL Not Observed  Globulin, Total     2.2 - 3.9 g/dL 3.2  Albumin/Glob SerPl     0.7 - 1.7 1.2  IFE 1      Comment  Please Note (HCV):      Comment  Iron     41 - 142 ug/dL 37 (L)  TIBC     236 - 444 ug/dL 315  UIBC     120 - 384 ug/dL 278  %SAT     21 - 57 % 12 (L)  Ig Kappa Free Light Chain     3.3 -  19.4 mg/L 17.9  Ig Lambda Free Light Chain     5.7 - 26.3 mg/L 9.6  Kappa/Lambda FluidC Ratio     0.26 - 1.65 1.86 (H)  Sed Rate     0 - 40 mm/hr 9  CRP     0.0 - 4.9 mg/L 4.9  Ferritin     9 - 269 ng/ml 134    RADIOGRAPHIC STUDIES: I have personally reviewed the radiological images as listed and agreed with the findings in the report. Nm Bone Scan Whole Body  Result Date: 02/10/2016 CLINICAL DATA:  Right-sided rib pain. Abnormal CT scan. History knee replacements and lumbar surgery. Recent fall. EXAM: NUCLEAR MEDICINE WHOLE BODY BONE SCAN TECHNIQUE: Whole body anterior and posterior images were obtained approximately 3 hours after intravenous injection of radiopharmaceutical. RADIOPHARMACEUTICALS:  25.3 mCi Technetium-49m MDP IV COMPARISON:  CT 12/25/2015.  Lumbar spine 04/10/2014 . FINDINGS: Bilateral renal function and excretion. Increased activity noted over the right posterior seventh rib region and adjacent mid thoracic spine. An active process including Paget's disease, fibrous dysplasia, metastatic disease again could present this fashion as noted on prior recent CT. Activity noted over both knees consistent with prior knee replacements. Increased activity noted over the right tibia may be positional however to exclude a a bony process right tib-fib series suggested. Increased activity noted over the right foot. This could be  degenerative. A focal right foot lesion cannot be excluded right foot series suggested for further evaluation . IMPRESSION: 1. Increased activity noted over the right posterior seventh rib region and adjacent mid thoracic spine in region of previously identified abnormality on recent CT. An active process including Paget's disease, fibrous dysplasia, and metastatic disease could again present in this fashion as noted on prior recent CT. 2. Increased activity noted the right tibia may be positional and/or related to prior knee replacement however to exclude a bony process a right tib-fib series is suggested. 3. Increased activity over the right foot. This could be degenerative. A focal right foot lesion including fracture cannot be excluded, right foot series suggest for further evaluation. Electronically Signed   By: Marcello Moores  Register   On: 02/10/2016 15:16   Ct Abdomen Pelvis W Contrast  Result Date: 02/12/2016 CLINICAL DATA:  Right upper quadrant pain for 6 months. EXAM: CT ABDOMEN AND PELVIS WITH CONTRAST TECHNIQUE: Multidetector CT imaging of the abdomen and pelvis was performed using the standard protocol following bolus administration of intravenous contrast. CONTRAST:  1106mL ISOVUE-300 IOPAMIDOL (ISOVUE-300) INJECTION 61% COMPARISON:  03/15/2014 FINDINGS: Lower chest:  The lung bases appear clear. Hepatobiliary: There is focal liver abnormality. Previous coli cystectomy. Biliary dilatation. Pancreas: No mass, inflammatory changes, or other significant abnormality. Spleen: Within normal limits in size and appearance. Adrenals/Urinary Tract: The adrenal glands are normal. Unremarkable appearance of both kidneys. The urinary bladder appears normal. Stomach/Bowel: The stomach appears normal. The small bowel loops have a normal course and caliber. There is no bowel obstruction identified. The appendix is visualized and appears normal. Vascular/Lymphatic: Calcified atherosclerotic disease involves the abdominal  aorta. No aneurysm. No enlarged retroperitoneal or mesenteric adenopathy. No enlarged pelvic or inguinal lymph nodes. There is a right external iliac lymph node which is enlarged measuring 1.4 cm, image 27 of series 2. Reproductive: Previous hysterectomy.  No adnexal mass. Other: There is no ascites or focal fluid collections within the abdomen or pelvis. Musculoskeletal: Previous laminectomy and posterior hardware fixation of L3 through S1. No suspicious bone lesions identified. IMPRESSION: 1. No acute findings within  the abdomen or pelvis. 2. Solitary enlarged right external iliac lymph node of questionable clinical significance. This could be followed up with a repeat CT of the pelvis in 3-6 months. 3. Aortic atherosclerosis. Electronically Signed   By: Kerby Moors M.D.   On: 02/12/2016 10:25   Mm Screening Breast Tomo Bilateral  Result Date: 01/27/2016 CLINICAL DATA:  Screening. EXAM: 2D DIGITAL SCREENING BILATERAL MAMMOGRAM WITH CAD AND ADJUNCT TOMO COMPARISON:  Previous exam(s). ACR Breast Density Category b: There are scattered areas of fibroglandular density. FINDINGS: There are no findings suspicious for malignancy. Images were processed with CAD. IMPRESSION: No mammographic evidence of malignancy. A result letter of this screening mammogram will be mailed directly to the patient. RECOMMENDATION: Screening mammogram in one year. (Code:SM-B-01Y) BI-RADS CATEGORY  1: Negative. Electronically Signed   By: Lajean Manes M.D.   On: 01/27/2016 15:50    ASSESSMENT & PLAN:   50 yo with   1) Rt chest wall pain with CTA chest showing Cortical thickening of the right posterior seventh rib which appears to be new. In the setting of elevated alkaline phosphatase levels this could certain represent pagets disease. 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  CT abdomen pelvis showed  no overt evidence of metastatic disease. Small 1.4 cm right pelvic lymph node appears to be nonspecifically enlarged but will need to be followed up.  No clinical or imaging evidence of a primary malignancy at this time. Plan -would recommend primary care physician to consider an MRI of the T-spine to rule out thoracic radiculopathy that could be causing her right-sided chest pain. -We discussed about the possibility of Paget's disease. We discussed the treatment indication for Paget's disease including symptomatic Paget's disease or asymptomatic Paget's disease with serum alkaline phosphatase 2-4 times ULN, planned surgery at active pagetic site, hypercalcemia associated with this, pagetic disease at the site where complications could occur. - we talked about the use of oral bisphosphonates or IV bisphosphonates vs calcitonin. -She is on high-dose ergocalciferol vitamin D replacement and was also informed to take adequate calcium intake. -She will follow-up with her primary care physician to consider further imaging of her T-spine to rule out other pathology for her chest wall pain. -Given her recent right ankle injury with possible fracture would hold off on bisphosphonates to have right ankle heals. -continue age appropriate cancer screening with PCP (due for MMG) -would consider repeat CT of the abdomen pelvis to reevaluate the small pelvic lymph node in about 6 months.  2) h/o Iron deficiency anemia - ferritin currently 134 and hgb near normal at 11.8 with normal MCV. Element of blood loss anemia related to her knee surgery in April 2017 and last year. Patient has been on FESO4 1 tab po BID which has corrected most of her anemia. Plan -she reports that she was recommended a capsule endoscopy and should f/u with GI recommendations. -might reduce FESO4 to 1 tab po daily and continue till iron sat close to 30% . -reasonable to take a daily multivitamin.  Continue f/u with PCP  RTC with Dr  Irene Limbo in 2-3 months with repeat labs  . Orders Placed This Encounter  Procedures  . CBC & Diff and Retic    Standing Status:   Future    Standing Expiration Date:   04/19/2016  . Comprehensive metabolic panel    Standing Status:   Future  Standing Expiration Date:   04/19/2016     All of the patients questions were answered with apparent satisfaction. The patient knows to call the clinic with any problems, questions or concerns.  I spent 25 minutes counseling the patient face to face. The total time spent in the appointment was 25 minutes and more than 50% was on counseling and direct patient cares.    Sullivan Lone MD [place] AAHIVMS [facility] Hematology/Oncology Physician [facility]  (Office):       [phone] (Work cell):  [phone] (Fax):           [phone]  02/17/2016 10:53 AM

## 2016-04-06 ENCOUNTER — Ambulatory Visit (INDEPENDENT_AMBULATORY_CARE_PROVIDER_SITE_OTHER): Payer: Medicare HMO | Admitting: Neurology

## 2016-04-06 ENCOUNTER — Other Ambulatory Visit: Payer: Self-pay

## 2016-04-06 ENCOUNTER — Telehealth: Payer: Self-pay | Admitting: Neurology

## 2016-04-06 ENCOUNTER — Encounter: Payer: Self-pay | Admitting: Neurology

## 2016-04-06 VITALS — BP 132/68 | HR 94 | Ht 66.0 in | Wt 259.0 lb

## 2016-04-06 DIAGNOSIS — I779 Disorder of arteries and arterioles, unspecified: Secondary | ICD-10-CM

## 2016-04-06 DIAGNOSIS — G43709 Chronic migraine without aura, not intractable, without status migrainosus: Secondary | ICD-10-CM | POA: Diagnosis not present

## 2016-04-06 DIAGNOSIS — G43409 Hemiplegic migraine, not intractable, without status migrainosus: Secondary | ICD-10-CM

## 2016-04-06 DIAGNOSIS — H8110 Benign paroxysmal vertigo, unspecified ear: Secondary | ICD-10-CM

## 2016-04-06 DIAGNOSIS — IMO0002 Reserved for concepts with insufficient information to code with codable children: Secondary | ICD-10-CM

## 2016-04-06 DIAGNOSIS — I739 Peripheral vascular disease, unspecified: Secondary | ICD-10-CM

## 2016-04-06 MED ORDER — ATENOLOL 25 MG PO TABS: 75.0000 mg | ORAL_TABLET | Freq: Every day | ORAL | 2 refills | Status: DC

## 2016-04-06 MED ORDER — DICLOFENAC POTASSIUM(MIGRAINE) 50 MG PO PACK
50.0000 mg | PACK | ORAL | 2 refills | Status: DC
Start: 2016-04-06 — End: 2016-04-06

## 2016-04-06 MED ORDER — DICLOFENAC POTASSIUM(MIGRAINE) 50 MG PO PACK
50.0000 mg | PACK | ORAL | 0 refills | Status: DC
Start: 2016-04-06 — End: 2016-07-10

## 2016-04-06 NOTE — Telephone Encounter (Signed)
Trent Cleare 04/25/1966 called and wanted to make sure the correct pharmacy was in her chart. She uses [facility] on [address] Thank you

## 2016-04-06 NOTE — Progress Notes (Addendum)
NEUROLOGY CONSULTATION NOTE  Wanda [name] MRN: [id number] DOB: 1966/06/06  Referring provider: Dr. Chapman Fitch Primary care provider: Dr. Chapman Fitch  Reason for consult:  migraine  HISTORY OF PRESENT ILLNESS: Wanda [name] is a 50 year old right-handed woman with Paget's disease, GERD, hyperlipidemia, carotid artery disease and bilateral knee replacements who presents for headaches.  History obtained by patient, ED notes and PCP note.  On 10/28/11, she was hospitalized for severe left sided headache associated with slurred speech, left facial droop and left sided arm and leg weakness and numbness.  Headache lasted a day and weakness lasted a few days but she reports mild residual weakness.  MRI and MRA of head were personally reviewed and were negative for acute stroke.  Carotid doppler showed 40-59% bilateral ICA stenosis with antegrade flow in the vertebral arteries.  Echo showed EF 60% with no PFO or ASD.  She was diagnosed with TIA and started on ASA.  She has subsequently had several other similar episodes.  CT head and MRI brain from 06/02/12, CT head and MRI of brain from 10/28/12, CT head and MRI brain from 06/02/15, and MRI brain from 10/03/15 were all personally reviewed and were unremarkable.  MRI/MRA/MRV of head from 02/28/13 were unremarkable.  She has left sided headache without the weakness as well  It is severe and associated with nausea and photophobia.  They last 1 to 2 days. They occur 2 to 3 days per week.  She has 25 headache days per month. Triggers/exacerbating factors:  Dairy, cigarette smoke Relieving factors:  rest Activity:  Cannot function with severe headache.  Past NSAIDS:  Naproxen (GI upset), ibuprofen Past analgesics:  Percocet (hives), Norco, tramadol Past abortive triptans:  Relpax Past muscle relaxants: no Past anti-emetic:  Phenergan 25mg  Past antihypertensive medications:  verapamil 24hr 180mg , HCTZ, lisinopril Past antidepressant medications:  nortriptyline  50mg  Past anticonvulsant medications:  Depakote, topiramate 100mg  twice daily (effective but caused memory problems and paresthesias) Past vitamins/Herbal/Supplements:  no Other past therapy:  Botox (2 rounds)  Current NSAIDS:  no Current analgesics:  Dilaudid Current triptans:  no Current anti-emetic:  Zofran 8mg  Current muscle relaxants:  Tizanidine 4mg  Current Antihypertensive medications:  atenolol 50mg  Current Antidepressant medications:  sertraline 100mg  Current Anticonvulsant medications:  zonisamide 200mg  twice daily, gabapentin 300mg  twice daily Current Vitamins/Herbal/Supplements:  ferrous sulfate Current Antihistamines/Decongestants:  cyproheptadine 4mg  BID Other therapy:  no  Caffeine:  1 cup coffee daily Alcohol:  no Smoker:  no Diet:  Needs to keep hydrated more Exercise:  No because of knees.  Uses cane. Depression/stress:  okay Sleep: fair Family history of headache:  mother  Labs from 01/22/16 include CBC with WBC 7.7, HGB 12.9, HCT 39.9 and PLT 184; CMP with Na 144, K 3.8, BUN 8.9, Cr 0.8, TB less than 0.30, ALP 189, AST 26 and ALT 25; Sed Rate 9, CRP 4.9.  She also reports brief positional spinning sensation with quick head movements.  They started several months ago and are fairly frequent. She also sees ENT for sinus fullness.  Imaging revealed no sinusitis.  PAST MEDICAL HISTORY: Past Medical History:  Diagnosis Date  . Acne   . Anemia    takes iron supplement  . Anxiety   . Asthma   . Chronic bronchitis ([place])   . Depression   . Family history of adverse reaction to anesthesia    pt's mother has hx. of post-op N/V  . GERD (gastroesophageal reflux disease)   . History of blood transfusion   .  History of TIA (transient ischemic attack)    x 2; left-sided weakness  . Hypercholesterolemia    no current med.  . Hypertension    states under control with meds., has been on med. since 06/2011  . Keloid [id number]   umbilical  . Migraines   .  Osteoarthritis   . PONV (postoperative nausea and vomiting)    states severe  . Seasonal allergies   . Stroke ([place]) 10/27/2012   TIA   1- 10/29/11-weakness left side    PAST SURGICAL HISTORY: Past Surgical History:  Procedure Laterality Date  . CARPAL TUNNEL RELEASE Left 12/2013  . CARPAL TUNNEL RELEASE Right 03/29/2015  . CHOLECYSTECTOMY  08/27/2009  . DIAGNOSTIC LAPAROSCOPY  early 2005  . ETHMOIDECTOMY Bilateral 04/30/2006  . FUNCTIONAL ENDOSCOPIC SINUS SURGERY  04/30/2006  . HYSTEROSCOPY W/D&C  04/21/2002   with lysis of adhesions  . KNEE ARTHROSCOPY Left 02/2000  . LAMINOTOMY Left 03/27/2010   L5-S1; microdissection  . LAMINOTOMY Bilateral 07/12/2007   L4-5  . LUMBAR FUSION Left 07/18/2010   L4-5; L5-S1  . LUMBAR FUSION  04/10/2014   L3-4, L4-5, L5-S1  . NASAL TURBINATE REDUCTION  04/30/2006   inferior  . TOTAL ABDOMINAL HYSTERECTOMY  11/01/2003   with lysis of adhesions; ovaries retained  . TOTAL KNEE ARTHROPLASTY Left 12/31/2014   Procedure: TOTAL LEFT KNEE ARTHROPLASTY;  Surgeon: Frederik Pear, MD;  Location: [facility];  Service: Orthopedics;  Laterality: Left;  . TOTAL KNEE ARTHROPLASTY Right 11/11/2015   Procedure: TOTAL KNEE ARTHROPLASTY;  Surgeon: Frederik Pear, MD;  Location: [facility];  Service: Orthopedics;  Laterality: Right;  Marland Kitchen VAGINAL HYSTERECTOMY     for DUB in 10/2003; partial    MEDICATIONS: Current Outpatient Prescriptions on File Prior to Visit  Medication Sig Dispense Refill  . albuterol (PROVENTIL HFA;VENTOLIN HFA) 108 (90 BASE) MCG/ACT inhaler Inhale into the lungs every 6 (six) hours as needed for wheezing or shortness of breath.    Marland Kitchen albuterol (PROVENTIL) (5 MG/ML) 0.5% nebulizer solution Take 2.5 mg by nebulization every 6 (six) hours as needed for wheezing or shortness of breath.    Marland Kitchen aspirin EC 325 MG tablet Take 1 tablet (325 mg total) by mouth 2 (two) times daily. 30 tablet 0  . busPIRone (BUSPAR) 15 MG tablet Take 15 mg by mouth 2 (two) times daily.     . cetirizine  (ZYRTEC) 10 MG chewable tablet Chew 10 mg by mouth daily.    . ferrous sulfate 325 (65 FE) MG tablet Take 325 mg by mouth 2 (two) times daily with a meal.    . Fluticasone-Salmeterol (ADVAIR) 250-50 MCG/DOSE AEPB Inhale 1 puff into the lungs 2 (two) times daily.    Marland Kitchen gabapentin (NEURONTIN) 300 MG capsule Take 300 mg by mouth 2 (two) times daily.     . hydrochlorothiazide (MICROZIDE) 12.5 MG capsule Take 12.5 mg by mouth daily.    . methocarbamol (ROBAXIN) 500 MG tablet Take 1 tablet (500 mg total) by mouth 2 (two) times daily with a meal. 60 tablet 0  . mometasone (NASONEX) 50 MCG/ACT nasal spray Place 2 sprays into the nose daily.    . montelukast (SINGULAIR) 10 MG tablet Take 10 mg by mouth at bedtime.    . ondansetron (ZOFRAN) 8 MG tablet Take 8 mg by mouth every 8 (eight) hours as needed for nausea or vomiting.    . pantoprazole (PROTONIX) 40 MG tablet Take 40 mg by mouth 2 (two) times daily.     Marland Kitchen  rosuvastatin (CRESTOR) 10 MG tablet Take 10 mg by mouth daily.    . sertraline (ZOLOFT) 100 MG tablet Take 100-200 mg by mouth 2 (two) times daily. Take 100 mg in the am and 200 mg in the pm    . tiZANidine (ZANAFLEX) 4 MG tablet Take 4 mg by mouth 2 (two) times daily.    . Vitamin D, Ergocalciferol, (DRISDOL) 50000 UNITS CAPS Take 50,000 Units by mouth every 7 (seven) days. mondays     No current facility-administered medications on file prior to visit.     ALLERGIES: Allergies  Allergen Reactions  . Lisinopril Swelling and Other (See Comments)    SWELLING FACE/LIPS/NECK  . Topamax [Topiramate] Other (See Comments)    SHORT-TERM MEMORY LOSS  . Latex Rash  . Oxycodone-Acetaminophen Itching  . Oxycodone-Acetaminophen Itching and Rash    Also percocet    FAMILY HISTORY: Family History  Problem Relation Age of Onset  . Anesthesia problems Mother     post-op N/V  . Anxiety disorder Father   . Depression Father   . Cervical cancer Maternal Grandmother   . Cancer Maternal Grandmother       mouth  . Heart attack Maternal Grandmother 55  . Hypertension Brother   . Heart failure Brother     ICD    SOCIAL HISTORY: Social History   Social History  . Marital status: Divorced    Spouse name: N/A  . Number of children: 2  . Years of education: N/A   Occupational History  . health advisor Unemployed    [employer]   Social History Main Topics  . Smoking status: Former Smoker    Packs/day: 0.00    Quit date: 07/20/2004  . Smokeless tobacco: Never Used  . Alcohol use No  . Drug use: No  . Sexual activity: Yes    Partners: Male   Other Topics Concern  . Not on file   Social History Narrative   Lives in [place] with daughter      Servando Snare (c) [phone]   nigel       (c) [phone]    REVIEW OF SYSTEMS: Constitutional: No fevers, chills, or sweats, no generalized fatigue, change in appetite Eyes: No visual changes, double vision, eye pain Ear, nose and throat: No hearing loss, ear pain, nasal congestion, sore throat Cardiovascular: No chest pain, palpitations Respiratory:  No shortness of breath at rest or with exertion, wheezes GastrointestinaI: No nausea, vomiting, diarrhea, abdominal pain, fecal incontinence Genitourinary:  No dysuria, urinary retention or frequency Musculoskeletal:  No neck pain, back pain Integumentary: No rash, pruritus, skin lesions Neurological: as above Psychiatric: No depression, insomnia, anxiety Endocrine: No palpitations, fatigue, diaphoresis, mood swings, change in appetite, change in weight, increased thirst Hematologic/Lymphatic:  No purpura, petechiae. Allergic/Immunologic: no itchy/runny eyes, nasal congestion, recent allergic reactions, rashes  PHYSICAL EXAM: Vitals:   04/06/16 1254  BP: 132/68  Pulse: 94   General: No acute distress.  Patient appears well-groomed.  Head:  Normocephalic/atraumatic Eyes:  fundi examined but not visualized Neck: supple, no paraspinal tenderness, full range of motion Back: No  paraspinal tenderness Heart: regular rate and rhythm Lungs: Clear to auscultation bilaterally. Vascular: No carotid bruits. Neurological Exam: Mental status: alert and oriented to person, place, and time, recent and remote memory intact, fund of knowledge intact, attention and concentration intact, speech fluent and not dysarthric, language intact. Cranial nerves: CN I: not tested CN II: pupils equal, round and reactive to light, visual fields intact CN III,  IV, VI:  full range of motion, no nystagmus, no ptosis CN V: reduced facial sensation on left V1-V3, including to bone conduction on forehead. CN VII: upper and lower face symmetric CN VIII: hearing intact CN IX, X: gag intact, uvula midline CN XI: sternocleidomastoid and trapezius muscles intact CN XII: tongue midline Bulk & Tone: normal, no fasciculations. Motor:  Give-way weakness 5-/5 left deltoid and grip, hip flexion.  Otherwise 5/5 throughout  Sensation:  Pinprick and vibration sensation reduced on left upper and lower extremities. Deep Tendon Reflexes:  2+ throughout, toes downgoing. Finger to nose testing:  Mild left dysmetria.   Heel to shin:  Difficult to assess due to knees. Gait:  Ambulates with cane and with right leg limp.  Unsteady when attempts Romberg.  IMPRESSION: 1.  Chronic migraine and hemiplegic migraine She exhibits some residual deficits in regards to left sided weakness and numbness, which appear functional. 2.  Carotid artery disease 3.  Dizziness.  Appears to be peripheral/BPPV)  PLAN: 1.  Will increase atenolol to 75mg  daily.  Will taper off zonisamide, since she says it has not been helpful.  Other options include increasing gabapentin, switching from atenolol to propranolol, or starting extended release topiramate which less likely causes side effects with paresthesias and memory. 2.  Continue gabapentin 3.  Will see if Cathren Harsh is pre-approved by insurance or affordable.  Otherwise, we can try  ketorolac.  She had not tolerated tramadol and Tylenol is ineffective.  Triptans are contraindicated in people with hemiplegic migraines. 4.  Continue ASA and Crestor.  Will recheck carotid doppler 5.  She will consider supplements as well (Mg, riboflavin, coenzyme Q10). 6.  Suggested vestibular rehab for vertigo.  She will discuss with ENT first. 7.  Contact us in 4 weeks with update.  Follow up in 3 months.l  Thank you for allowing me to take part in the care of this patient.  Metta Clines, DO  CC:  Antony Blackbird, MD

## 2016-04-06 NOTE — Patient Instructions (Addendum)
Migraine Recommendations: 1.  We will increase atenolol to 75mg  daily.  Call in 4 weeks with update and we can adjust dose if needed.  At the same time, we will taper off of zonisamide.  Take 1 pill twice daily for 3 days, then 1 pill at bedtime for 3 days, then stop. 2.  We will see if your insurance will ocer Cambia powder.  When you get a migraine, you mix packet of powder in cup of water as instructed.  Give on empty stomach but may take with food if you have GI upset.  No more than 1 packet in 24 hours.  If it is not covered or if it is too expensive, we will try something else. 3.  Limit use of pain relievers to no more than 2 days out of the week.  These medications include acetaminophen, ibuprofen, triptans and narcotics.  This will help reduce risk of rebound headaches. 4.  Be aware of common food triggers such as processed sweets, processed foods with nitrites (such as deli meat, hot dogs, sausages), foods with MSG, alcohol (such as wine), chocolate, certain cheeses, certain fruits (dried fruits, some citrus fruit), vinegar, diet soda. 4.  Avoid caffeine 5.  Routine exercise 6.  Proper sleep hygiene 7.  Stay adequately hydrated with water 8.  Keep a headache diary. 9.  Maintain proper stress management. 10.  Do not skip meals. 11.  Consider supplements:  Magnesium oxide 400mg  to 600mg  daily, riboflavin 400mg , Coenzyme Q 10 100mg  three times daily 12.  We will check a carotid doppler 13.  Continue aspirin 325mg  daily and Crestor. 14.  Follow up in 3 months but contact me in 4 weeks with update.

## 2016-04-06 NOTE — Telephone Encounter (Signed)
Corrected in chart. RX sent to correct pharmacy.

## 2016-04-13 ENCOUNTER — Other Ambulatory Visit: Payer: Medicare HMO

## 2016-04-13 ENCOUNTER — Ambulatory Visit: Payer: Medicare HMO | Admitting: Hematology

## 2016-04-13 ENCOUNTER — Telehealth: Payer: Self-pay | Admitting: Hematology

## 2016-04-13 NOTE — Telephone Encounter (Signed)
04/13/2016 Appointments canceled per patient request. Patient has migraine.

## 2016-04-15 ENCOUNTER — Telehealth: Payer: Self-pay | Admitting: Hematology

## 2016-04-15 NOTE — Telephone Encounter (Signed)
09/18 Appointment rescheduled to 10/09 per patient request.

## 2016-04-17 NOTE — Progress Notes (Signed)
Cambia P.A. Approved through 07/26/16.

## 2016-04-22 ENCOUNTER — Telehealth: Payer: Self-pay | Admitting: Neurology

## 2016-04-22 NOTE — Telephone Encounter (Signed)
PT called in regards to her medication and a change/Dawn CB# [phone]

## 2016-04-23 MED ORDER — DICLOFENAC POTASSIUM(MIGRAINE) 50 MG PO PACK: PACK | ORAL | 0 refills | Status: DC

## 2016-04-23 MED ORDER — ATENOLOL 100 MG PO TABS: 100.0000 mg | ORAL_TABLET | Freq: Every day | ORAL | 3 refills | Status: DC

## 2016-04-23 NOTE — Telephone Encounter (Signed)
I would like to increase atenolol to 100mg  daily (monitor for lightheadedness).  I would like to contact me in another 4 weeks with update and we can decide if any changes (medication or dosing) need to be made.

## 2016-04-23 NOTE — Telephone Encounter (Signed)
Spoke with patient. Cambia is helpful, but expensive. P.A. Initiated via covermymeds. Pt was given 9 samples. Pt also stated that she can tell no difference on higher dose of atenolol. Which was increased to 75 mg on 04/06/16. Please advise.

## 2016-04-23 NOTE — Telephone Encounter (Signed)
Message relayed to patient. Verbalized understanding and denied questions.   

## 2016-04-23 NOTE — Telephone Encounter (Signed)
Cambia p.A. Approved.

## 2016-04-23 NOTE — Telephone Encounter (Signed)
Please call.

## 2016-04-27 ENCOUNTER — Telehealth: Payer: Self-pay | Admitting: Neurology

## 2016-04-27 NOTE — Telephone Encounter (Signed)
Wanda [name] 08/05/1965. She came into the office today to pick up samples. While here she requested that you call her regarding getting her ultrasound scheduled for [facility]. She was needing to see if she should be hearing from them or our office. Thank you

## 2016-04-27 NOTE — Telephone Encounter (Signed)
Will send info via mychart.

## 2016-05-01 ENCOUNTER — Other Ambulatory Visit: Payer: Self-pay | Admitting: *Deleted

## 2016-05-01 DIAGNOSIS — M889 Osteitis deformans of unspecified bone: Secondary | ICD-10-CM

## 2016-05-04 ENCOUNTER — Ambulatory Visit (HOSPITAL_BASED_OUTPATIENT_CLINIC_OR_DEPARTMENT_OTHER): Payer: Medicare HMO | Admitting: Hematology

## 2016-05-04 ENCOUNTER — Other Ambulatory Visit (HOSPITAL_BASED_OUTPATIENT_CLINIC_OR_DEPARTMENT_OTHER): Payer: Medicare HMO

## 2016-05-04 ENCOUNTER — Encounter: Payer: Self-pay | Admitting: Hematology

## 2016-05-04 ENCOUNTER — Telehealth: Payer: Self-pay | Admitting: Hematology

## 2016-05-04 ENCOUNTER — Telehealth: Payer: Self-pay | Admitting: *Deleted

## 2016-05-04 VITALS — BP 136/59 | HR 84 | Temp 98.5°F | Resp 16 | Wt 254.9 lb

## 2016-05-04 DIAGNOSIS — R748 Abnormal levels of other serum enzymes: Secondary | ICD-10-CM

## 2016-05-04 DIAGNOSIS — M889 Osteitis deformans of unspecified bone: Secondary | ICD-10-CM

## 2016-05-04 DIAGNOSIS — D509 Iron deficiency anemia, unspecified: Secondary | ICD-10-CM

## 2016-05-04 LAB — COMPREHENSIVE METABOLIC PANEL
ALT: 20 U/L (ref 0–55)
AST: 22 U/L (ref 5–34)
Albumin: 3.9 g/dL (ref 3.5–5.0)
Alkaline Phosphatase: 158 U/L — ABNORMAL HIGH (ref 40–150)
Anion Gap: 10 mEq/L (ref 3–11)
BILIRUBIN TOTAL: 0.32 mg/dL (ref 0.20–1.20)
BUN: 11.5 mg/dL (ref 7.0–26.0)
CHLORIDE: 107 meq/L (ref 98–109)
CO2: 28 meq/L (ref 22–29)
CREATININE: 0.7 mg/dL (ref 0.6–1.1)
Calcium: 9.7 mg/dL (ref 8.4–10.4)
EGFR: >90 mL/min/{1.73_m2} (ref >90)
GLUCOSE: 109 mg/dL (ref 70–140)
Potassium: 3.7 mEq/L (ref 3.5–5.1)
SODIUM: 145 meq/L (ref 136–145)
TOTAL PROTEIN: 7.3 g/dL (ref 6.4–8.3)

## 2016-05-04 LAB — FERRITIN: Ferritin: 127 ng/ml (ref 9–269)

## 2016-05-04 LAB — CBC WITH DIFFERENTIAL/PLATELET
BASO%: 0.8 % (ref 0.0–2.0)
Basophils Absolute: 0.1 10*3/uL (ref 0.0–0.1)
EOS ABS: 0.2 10*3/uL (ref 0.0–0.5)
EOS%: 2.8 % (ref 0.0–7.0)
HCT: 41.1 % (ref 34.8–46.6)
HGB: 13.2 g/dL (ref 11.6–15.9)
LYMPH%: 24.6 % (ref 14.0–49.7)
MCH: 27.5 pg (ref 25.1–34.0)
MCHC: 32.1 g/dL (ref 31.5–36.0)
MCV: 85.9 fL (ref 79.5–101.0)
MONO#: 0.4 10*3/uL (ref 0.1–0.9)
MONO%: 6.1 % (ref 0.0–14.0)
NEUT%: 65.7 % (ref 38.4–76.8)
NEUTROS ABS: 4.8 10*3/uL (ref 1.5–6.5)
Platelets: 176 10*3/uL (ref 145–400)
RBC: 4.78 10*6/uL (ref 3.70–5.45)
RDW: 15.5 % — ABNORMAL HIGH (ref 11.2–14.5)
WBC: 7.3 10*3/uL (ref 3.9–10.3)
lymph#: 1.8 10*3/uL (ref 0.9–3.3)

## 2016-05-04 LAB — IRON AND TIBC
%SAT: 26 % (ref 21–57)
IRON: 82 ug/dL (ref 41–142)
TIBC: 310 ug/dL (ref 236–444)
UIBC: 228 ug/dL (ref 120–384)

## 2016-05-04 NOTE — Telephone Encounter (Signed)
Per LOS I have scheduled appt and notified the scheduler 

## 2016-05-04 NOTE — Telephone Encounter (Signed)
Message sent to chemo scheduler to add chemo inj, per 05/04/16 los. Also left voice message for Darlena, to retrieve prior authorization for injection. @ bottles of contrast given per CT orders. Avs report and appointment schedule given to patient, per 05/04/16 los.

## 2016-05-08 ENCOUNTER — Ambulatory Visit (HOSPITAL_BASED_OUTPATIENT_CLINIC_OR_DEPARTMENT_OTHER): Payer: Medicare HMO

## 2016-05-08 VITALS — BP 115/50 | HR 81 | Temp 98.3°F | Resp 18

## 2016-05-08 DIAGNOSIS — M889 Osteitis deformans of unspecified bone: Secondary | ICD-10-CM

## 2016-05-08 MED ORDER — ZOLEDRONIC ACID 4 MG/5ML IV CONC: 5.0000 mg | Freq: Once | INTRAVENOUS | Status: DC

## 2016-05-08 MED ORDER — SODIUM CHLORIDE 0.9 % IV SOLN
Freq: Once | INTRAVENOUS | Status: AC
  Administered 2016-05-08: 15:00:00 via INTRAVENOUS

## 2016-05-08 MED ORDER — ZOLEDRONIC ACID 4 MG/100ML IV SOLN
4.0000 mg | Freq: Once | INTRAVENOUS | Status: AC
  Administered 2016-05-08: 4 mg via INTRAVENOUS
  Filled 2016-05-08: qty 100

## 2016-05-08 NOTE — Patient Instructions (Signed)

## 2016-05-09 ENCOUNTER — Emergency Department (HOSPITAL_COMMUNITY)
Admission: EM | Admit: 2016-05-09 | Discharge: 2016-05-09 | Disposition: A | Discharge status: Home / Self Care | Payer: Medicare HMO | Attending: Emergency Medicine | Admitting: Emergency Medicine

## 2016-05-09 ENCOUNTER — Encounter (HOSPITAL_COMMUNITY): Payer: Self-pay | Admitting: Emergency Medicine

## 2016-05-09 DIAGNOSIS — T887XXA Unspecified adverse effect of drug or medicament, initial encounter: Secondary | ICD-10-CM | POA: Diagnosis not present

## 2016-05-09 DIAGNOSIS — J45909 Unspecified asthma, uncomplicated: Secondary | ICD-10-CM | POA: Insufficient documentation

## 2016-05-09 DIAGNOSIS — R502 Drug induced fever: Secondary | ICD-10-CM | POA: Diagnosis not present

## 2016-05-09 DIAGNOSIS — Y658 Other specified misadventures during surgical and medical care: Secondary | ICD-10-CM | POA: Diagnosis not present

## 2016-05-09 DIAGNOSIS — I10 Essential (primary) hypertension: Secondary | ICD-10-CM | POA: Insufficient documentation

## 2016-05-09 DIAGNOSIS — Z79899 Other long term (current) drug therapy: Secondary | ICD-10-CM | POA: Insufficient documentation

## 2016-05-09 DIAGNOSIS — M791 Myalgia, unspecified site: Secondary | ICD-10-CM

## 2016-05-09 DIAGNOSIS — Z91040 Latex allergy status: Secondary | ICD-10-CM | POA: Insufficient documentation

## 2016-05-09 DIAGNOSIS — Z96653 Presence of artificial knee joint, bilateral: Secondary | ICD-10-CM | POA: Diagnosis not present

## 2016-05-09 DIAGNOSIS — Z8673 Personal history of transient ischemic attack (TIA), and cerebral infarction without residual deficits: Secondary | ICD-10-CM | POA: Diagnosis not present

## 2016-05-09 DIAGNOSIS — Z87891 Personal history of nicotine dependence: Secondary | ICD-10-CM | POA: Insufficient documentation

## 2016-05-09 DIAGNOSIS — T50995A Adverse effect of other drugs, medicaments and biological substances, initial encounter: Secondary | ICD-10-CM | POA: Diagnosis not present

## 2016-05-09 DIAGNOSIS — Z7982 Long term (current) use of aspirin: Secondary | ICD-10-CM | POA: Insufficient documentation

## 2016-05-09 DIAGNOSIS — R509 Fever, unspecified: Secondary | ICD-10-CM | POA: Diagnosis present

## 2016-05-09 HISTORY — DX: Osteitis deformans of unspecified bone: M88.9

## 2016-05-09 LAB — URINALYSIS, ROUTINE W REFLEX MICROSCOPIC
Bilirubin Urine: NEGATIVE
GLUCOSE, UA: NEGATIVE mg/dL
HGB URINE DIPSTICK: NEGATIVE
Ketones, ur: NEGATIVE mg/dL
Leukocytes, UA: NEGATIVE
Nitrite: NEGATIVE
Protein, ur: NEGATIVE mg/dL
SPECIFIC GRAVITY, URINE: 1.012 (ref 1.005–1.030)
pH: 7 (ref 5.0–8.0)

## 2016-05-09 LAB — CBC
HCT: 39.2 % (ref 36.0–46.0)
Hemoglobin: 12.6 g/dL (ref 12.0–15.0)
MCH: 28.3 pg (ref 26.0–34.0)
MCHC: 32.1 g/dL (ref 30.0–36.0)
MCV: 87.9 fL (ref 78.0–100.0)
PLATELETS: 161 10*3/uL (ref 150–400)
RBC: 4.46 MIL/uL (ref 3.87–5.11)
RDW: 14.7 % (ref 11.5–15.5)
WBC: 6.8 10*3/uL (ref 4.0–10.5)

## 2016-05-09 LAB — BASIC METABOLIC PANEL
Anion gap: 7 (ref 5–15)
BUN: 10 mg/dL (ref 6–20)
CHLORIDE: 106 mmol/L (ref 101–111)
CO2: 30 mmol/L (ref 22–32)
CREATININE: 0.67 mg/dL (ref 0.44–1.00)
Calcium: 9.4 mg/dL (ref 8.9–10.3)
GFR calc non Af Amer: >60 mL/min (ref >60)
GLUCOSE: 106 mg/dL — AB (ref 65–99)
Potassium: 3.4 mmol/L — ABNORMAL LOW (ref 3.5–5.1)
Sodium: 143 mmol/L (ref 135–145)

## 2016-05-09 MED ORDER — SODIUM CHLORIDE 0.9 % IV BOLUS (SEPSIS)
500.0000 mL | Freq: Once | INTRAVENOUS | Status: AC
  Administered 2016-05-09: 500 mL via INTRAVENOUS

## 2016-05-09 MED ORDER — KETOROLAC TROMETHAMINE 30 MG/ML IJ SOLN
30.0000 mg | Freq: Once | INTRAMUSCULAR | Status: AC
  Administered 2016-05-09: 30 mg via INTRAVENOUS
  Filled 2016-05-09: qty 1

## 2016-05-09 MED ORDER — ONDANSETRON HCL 4 MG/2ML IJ SOLN
4.0000 mg | Freq: Once | INTRAMUSCULAR | Status: AC
  Administered 2016-05-09: 4 mg via INTRAVENOUS
  Filled 2016-05-09: qty 2

## 2016-05-09 NOTE — ED Provider Notes (Signed)
[facility] DEPT Provider Note   CSN: [id number] Arrival date & time: 05/09/16  1338     History   Chief Complaint Chief Complaint  Patient presents with  . Fever  . Weakness    HPI Wanda [name] is a 50 y.o. female.  She presents for evaluation of fever and achiness which started after she awoke this morning around 10 AM. She had a first infusion yesterday of zoledronic acid, for Paget's disease. She denies cough, chest pain, nausea, vomiting, weakness or dizziness. She did not take anything for the discomfort today. She decided to come here for evaluation after talking to "the nurse". There are no other known modifying factors.  HPI  Past Medical History:  Diagnosis Date  . Acne   . Anemia    takes iron supplement  . Anxiety   . Asthma   . Chronic bronchitis ([place])   . Depression   . Family history of adverse reaction to anesthesia    pt's mother has hx. of post-op N/V  . GERD (gastroesophageal reflux disease)   . History of blood transfusion   . History of TIA (transient ischemic attack)    x 2; left-sided weakness  . Hypercholesterolemia    no current med.  . Hypertension    states under control with meds., has been on med. since 06/2011  . Keloid [id number]   umbilical  . Migraines   . Osteoarthritis   . Paget disease of bone   . PONV (postoperative nausea and vomiting)    states severe  . Seasonal allergies   . Stroke ([place]) 10/27/2012   TIA   1- 10/29/11-weakness left side    Patient Active Problem List   Diagnosis Date Noted  . Paget disease of bone 05/04/2016  . Bone lesion 01/22/2016  . Osteoarthritis of right knee 11/11/2015  . Primary osteoarthritis of right knee 11/10/2015  . Arthritis of knee 12/31/2014  . Primary osteoarthritis of left knee 10/12/2014  . Lumbar spinal stenosis 04/10/2014  . 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 Cervical disc disorder with radiculopathy of cervical region 05/03/2013  . Weakness of left side of  body 10/28/2012  . Complicated migraine 123456  . Disturbance of skin sensation 06/02/2012  . CVA (cerebral infarction)? vs Atypical Migraine 10/28/2011  . Sinusitis 04/10/2011  . Borderline blood pressure 04/10/2011  . CHRONIC RHINITIS 04/28/2010  . Lumbar L5 diskektomy 02/17/2010  . GERD 08/20/2009  . ABDOMINAL PAIN, UNSPECIFIED 08/20/2009  . HYPERCHOLESTEROLEMIA 12/26/2007  . OBESITY 12/07/2007  . DEPRESSION 12/07/2007  . URI, ACUTE 12/07/2007  . TMJ PAIN 12/07/2007  . FATIGUE 12/07/2007  . HEADACHE, CHRONIC 12/07/2007  . TOBACCO ABUSE, HX OF 12/07/2007    Past Surgical History:  Procedure Laterality Date  . CARPAL TUNNEL RELEASE Left 12/2013  . CARPAL TUNNEL RELEASE Right 03/29/2015  . CHOLECYSTECTOMY  08/27/2009  . DIAGNOSTIC LAPAROSCOPY  early 2005  . ETHMOIDECTOMY Bilateral 04/30/2006  . FUNCTIONAL ENDOSCOPIC SINUS SURGERY  04/30/2006  . HYSTEROSCOPY W/D&C  04/21/2002   with lysis of adhesions  . KNEE ARTHROSCOPY Left 02/2000  . LAMINOTOMY Left 03/27/2010   L5-S1; microdissection  . LAMINOTOMY Bilateral 07/12/2007   L4-5  . LUMBAR FUSION Left 07/18/2010   L4-5; L5-S1  . LUMBAR FUSION  04/10/2014   L3-4, L4-5, L5-S1  . NASAL TURBINATE REDUCTION  04/30/2006   inferior  . TOTAL ABDOMINAL HYSTERECTOMY  11/01/2003   with lysis of adhesions; ovaries retained  . TOTAL KNEE ARTHROPLASTY Left 12/31/2014   Procedure: TOTAL  LEFT KNEE ARTHROPLASTY;  Surgeon: Frederik Pear, MD;  Location: [facility];  Service: Orthopedics;  Laterality: Left;  . TOTAL KNEE ARTHROPLASTY Right 11/11/2015   Procedure: TOTAL KNEE ARTHROPLASTY;  Surgeon: Frederik Pear, MD;  Location: [facility];  Service: Orthopedics;  Laterality: Right;  Marland Kitchen VAGINAL HYSTERECTOMY     for DUB in 10/2003; partial    OB History    No data available       Home Medications    Prior to Admission medications   Medication Sig Start Date End Date Taking? Authorizing Provider  albuterol (PROVENTIL HFA;VENTOLIN HFA) 108 (90 BASE) MCG/ACT  inhaler Inhale 1-2 puffs into the lungs every 6 (six) hours as needed for wheezing or shortness of breath.    Yes Historical Provider, MD  albuterol (PROVENTIL) (5 MG/ML) 0.5% nebulizer solution Take 2.5 mg by nebulization every 6 (six) hours as needed for wheezing or shortness of breath.   Yes Historical Provider, MD  aspirin EC 325 MG tablet Take 1 tablet (325 mg total) by mouth 2 (two) times daily. Patient taking differently: Take 325 mg by mouth daily.  11/11/15  Yes Leighton Parody, PA-C  atenolol (TENORMIN) 100 MG tablet Take 1 tablet (100 mg total) by mouth daily. 04/23/16  Yes Adam Telford Nab, DO  busPIRone (BUSPAR) 15 MG tablet Take 15 mg by mouth 2 (two) times daily.    Yes Historical Provider, MD  cetirizine (ZYRTEC) 10 MG chewable tablet Chew 10 mg by mouth at bedtime.    Yes Historical Provider, MD  Diclofenac Potassium 50 MG PACK Take 50 mg by mouth as directed. Mix with water and take at earliest onset of headace Patient taking differently: Take 50 mg by mouth daily as needed (headache). Mix with water and take at earliest onset of headace 04/06/16  Yes Adam Telford Nab, DO  ferrous sulfate 325 (65 FE) MG tablet Take 325 mg by mouth 2 (two) times daily with a meal.   Yes Historical Provider, MD  Fluticasone-Salmeterol (ADVAIR) 250-50 MCG/DOSE AEPB Inhale 1 puff into the lungs 2 (two) times daily.   Yes Historical Provider, MD  gabapentin (NEURONTIN) 300 MG capsule Take 300 mg by mouth 2 (two) times daily.    Yes Historical Provider, MD  hydrochlorothiazide (MICROZIDE) 12.5 MG capsule Take 12.5 mg by mouth daily.   Yes Historical Provider, MD  HYDROcodone-acetaminophen (NORCO) 7.5-325 MG tablet Take 1 tablet by mouth every 6 (six) hours as needed for moderate pain.    Yes Historical Provider, MD  mometasone (NASONEX) 50 MCG/ACT nasal spray Place 2 sprays into the nose daily.   Yes Historical Provider, MD  montelukast (SINGULAIR) 10 MG tablet Take 10 mg by mouth at bedtime.   Yes Historical  Provider, MD  ondansetron (ZOFRAN) 8 MG tablet Take 8 mg by mouth every 8 (eight) hours as needed for nausea or vomiting.   Yes Historical Provider, MD  pantoprazole (PROTONIX) 40 MG tablet Take 40 mg by mouth 2 (two) times daily before a meal.    Yes Historical Provider, MD  rosuvastatin (CRESTOR) 10 MG tablet Take 10 mg by mouth daily.   Yes Historical Provider, MD  sertraline (ZOLOFT) 100 MG tablet Take 100-200 mg by mouth 2 (two) times daily. Take 100 mg in the am and 200 mg in the pm   Yes Historical Provider, MD  tiZANidine (ZANAFLEX) 4 MG tablet Take 4 mg by mouth at bedtime as needed for muscle spasms.    Yes Historical Provider, MD  Vitamin  D, Ergocalciferol, (DRISDOL) 50000 UNITS CAPS Take 50,000 Units by mouth every 7 (seven) days. mondays   Yes Historical Provider, MD  Diclofenac Potassium (CAMBIA) 50 MG PACK As directed Patient not taking: Reported on 05/09/2016 04/23/16   Pieter Partridge, DO  methocarbamol (ROBAXIN) 500 MG tablet Take 1 tablet (500 mg total) by mouth 2 (two) times daily with a meal. Patient not taking: Reported on 05/09/2016 11/11/15   Leighton Parody, PA-C    Family History Family History  Problem Relation Age of Onset  . Anesthesia problems Mother     post-op N/V  . Anxiety disorder Father   . Depression Father   . Cervical cancer Maternal Grandmother   . Cancer Maternal Grandmother     mouth  . Heart attack Maternal Grandmother 55  . Hypertension Brother   . Heart failure Brother     ICD    Social History Social History  Substance Use Topics  . Smoking status: Former Smoker    Packs/day: 0.00    Quit date: 07/20/2004  . Smokeless tobacco: Never Used  . Alcohol use No     Allergies   Lisinopril; Topamax [topiramate]; Latex; and Oxycodone-acetaminophen   Review of Systems Review of Systems  All other systems reviewed and are negative.    Physical Exam Updated Vital Signs BP 130/67   Pulse 89   Temp 99.9 F (37.7 C) (Oral)   Resp 16    Wt 250 lb (113.4 kg)   SpO2 94%   BMI 40.35 kg/m   Physical Exam  Constitutional: She is oriented to person, place, and time. She appears well-developed.  Obese  HENT:  Head: Normocephalic and atraumatic.  Eyes: Conjunctivae and EOM are normal. Pupils are equal, round, and reactive to light.  Neck: Normal range of motion and phonation normal. Neck supple.  Cardiovascular: Normal rate and regular rhythm.   Pulmonary/Chest: Effort normal and breath sounds normal. She exhibits no tenderness.  Abdominal: Soft. She exhibits no distension. There is no tenderness. There is no guarding.  Musculoskeletal: Normal range of motion.  Neurological: She is alert and oriented to person, place, and time. She exhibits normal muscle tone.  Skin: Skin is warm and dry.  Psychiatric: She has a normal mood and affect. Her behavior is normal. Judgment and thought content normal.  Nursing note and vitals reviewed.    ED Treatments / Results  Labs (all labs ordered are listed, but only abnormal results are displayed) Labs Reviewed  BASIC METABOLIC PANEL - Abnormal; Notable for the following:       Result Value   Potassium 3.4 (*)    Glucose, Bld 106 (*)    All other components within normal limits  CBC  URINALYSIS, ROUTINE W REFLEX MICROSCOPIC (NOT AT [facility])    EKG  EKG Interpretation None       Radiology No results found.  Procedures Procedures (including critical care time)  Medications Ordered in ED Medications  sodium chloride 0.9 % bolus 500 mL (500 mLs Intravenous New Bag/Given 05/09/16 1615)  ondansetron (ZOFRAN) injection 4 mg (4 mg Intravenous Given 05/09/16 1619)  ketorolac (TORADOL) 30 MG/ML injection 30 mg (30 mg Intravenous Given 05/09/16 1620)     Initial Impression / Assessment and Plan / ED Course  I have reviewed the triage vital signs and the nursing notes.  Pertinent labs & imaging results that were available during my care of the patient were reviewed by me and  considered in my medical decision making (  see chart for details).  Clinical Course    Medications  sodium chloride 0.9 % bolus 500 mL (500 mLs Intravenous New Bag/Given 05/09/16 1615)  ondansetron (ZOFRAN) injection 4 mg (4 mg Intravenous Given 05/09/16 1619)  ketorolac (TORADOL) 30 MG/ML injection 30 mg (30 mg Intravenous Given 05/09/16 1620)    Patient Vitals for the past 24 hrs:  BP Temp Temp src Pulse Resp SpO2 Weight  05/09/16 1351 - - - - - - 250 lb (113.4 kg)  05/09/16 1350 130/67 99.9 F (37.7 C) Oral 89 16 94 % -    5:18 PM Reevaluation with update and discussion. After initial assessment and treatment, an updated evaluation reveals She states that her headache has resolved and she feels better. She is now sleepy. Findings discussed with patient and family members, all questions answered. Jillayne Witte L    Final Clinical Impressions(s) / ED Diagnoses   Final diagnoses:  Myalgia  Drug-induced fever    Myalgia and fever, following Zometa infusion. This is a common adverse reaction from this medication. No evidence for serious bacterial infection. Metabolic instability or impending vascular collapse.  Nursing Notes Reviewed/ Care Coordinated Applicable Imaging Reviewed Interpretation of Laboratory Data incorporated into ED treatment  The patient appears reasonably screened and/or stabilized for discharge and I doubt any other medical condition or other [facility] requiring further screening, evaluation, or treatment in the ED at this time prior to discharge.  Plan: Home Medications- continue usual, APAP/IBU for pain and fever; Home Treatments- rest, fluids; return here if the recommended treatment, does not improve the symptoms; Recommended follow up- usual providers prn   New Prescriptions New Prescriptions   No medications on file     Daleen Bo, MD 05/09/16 1722

## 2016-05-09 NOTE — Discharge Instructions (Signed)
Your muscle aches and fever secondary to the Zometa, infusion, the received yesterday.  For pain, use ibuprofen, or acetaminophen.  Get plenty of rest and drink a lot of fluids.  Follow-up with your doctor as needed for problems.

## 2016-05-09 NOTE — ED Triage Notes (Addendum)
Per EMS. Pt had infusion for Padget's disease yesterday. Today began to have a fever, chills, body aches, and weakness. Fever with EMS. Pt also reports R arm pain where infusion was and a migraine.  No acetaminophen or ibuprofen given by EMS

## 2016-06-01 ENCOUNTER — Other Ambulatory Visit: Payer: Self-pay | Admitting: Neurology

## 2016-06-25 ENCOUNTER — Ambulatory Visit
Admission: RE | Admit: 2016-06-25 | Discharge: 2016-06-25 | Disposition: A | Discharge status: Home / Self Care | Payer: Medicare HMO | Source: Ambulatory Visit | Attending: Neurology | Admitting: Neurology

## 2016-06-25 DIAGNOSIS — I739 Peripheral vascular disease, unspecified: Principal | ICD-10-CM

## 2016-06-25 DIAGNOSIS — I779 Disorder of arteries and arterioles, unspecified: Secondary | ICD-10-CM

## 2016-06-26 ENCOUNTER — Telehealth: Payer: Self-pay

## 2016-06-26 NOTE — Telephone Encounter (Signed)
-----   Message from Pieter Partridge, DO sent at 06/26/2016  8:51 AM EST ----- Carotid doppler looks okay

## 2016-06-26 NOTE — Telephone Encounter (Signed)
Sent via mychart

## 2016-07-10 ENCOUNTER — Encounter: Payer: Self-pay | Admitting: Neurology

## 2016-07-10 ENCOUNTER — Ambulatory Visit (INDEPENDENT_AMBULATORY_CARE_PROVIDER_SITE_OTHER): Payer: Medicare HMO | Admitting: Neurology

## 2016-07-10 VITALS — BP 126/84 | HR 68 | Ht 66.0 in | Wt 249.0 lb

## 2016-07-10 DIAGNOSIS — IMO0002 Reserved for concepts with insufficient information to code with codable children: Secondary | ICD-10-CM

## 2016-07-10 DIAGNOSIS — G43409 Hemiplegic migraine, not intractable, without status migrainosus: Secondary | ICD-10-CM

## 2016-07-10 DIAGNOSIS — G43709 Chronic migraine without aura, not intractable, without status migrainosus: Secondary | ICD-10-CM | POA: Diagnosis not present

## 2016-07-10 DIAGNOSIS — M542 Cervicalgia: Secondary | ICD-10-CM | POA: Diagnosis not present

## 2016-07-10 MED ORDER — ATENOLOL 50 MG PO TABS: 50.0000 mg | ORAL_TABLET | Freq: Every day | ORAL | 0 refills | Status: DC

## 2016-07-10 MED ORDER — GABAPENTIN 300 MG PO CAPS: 600.0000 mg | ORAL_CAPSULE | Freq: Two times a day (BID) | ORAL | 0 refills | Status: DC

## 2016-07-10 NOTE — Progress Notes (Signed)
NEUROLOGY FOLLOW UP OFFICE NOTE  Wanda [name] [id number]  HISTORY OF PRESENT ILLNESS: Wanda [name] is a 50 year old right-handed woman with Paget's disease, GERD, hyperlipidemia, carotid artery disease and bilateral knee replacements who follows up for chronic migraine and hemiplegic migraine.  UPDATE: Headaches are daily, right sided now. Current NSAIDS:  Cambia (effective but causes GI upset.  Has appointment with GI) Current analgesics:  Hydrocodone (for pain related to Paget's) Current triptans:  no Current anti-emetic:  Zofran 8mg  Current muscle relaxants:  Tizanidine 4mg  (helps neck pain but not headache) Current Antihypertensive medications:  atenolol 100mg  Current Antidepressant medications:  sertraline 100mg  Current Anticonvulsant medications:  gabapentin 300mg  twice daily Current Vitamins/Herbal/Supplements:  ferrous sulfate Current Antihistamines/Decongestants:  cyproheptadine 4mg  BID Other therapy:  No No hemiplegic migraines.   Caffeine:  1 cup coffee daily Alcohol:  no Smoker:  no Diet:  Needs to keep hydrated more Exercise:  No because of knees.  Uses cane. Depression/stress:  okay Sleep: fair  Carotid doppler from 06/26/16 showed minor carotid atherosclerosis but no hemodynamically significant stenosis. BMP from 05/09/16 was unremarkable.  HISTORY:  On 10/28/11, she was hospitalized for severe left sided headache associated with slurred speech, left facial droop and left sided arm and leg weakness and numbness.  Headache lasted a day and weakness lasted a few days but she reports mild residual weakness.  MRI and MRA of head were personally reviewed and were negative for acute stroke.  Carotid doppler showed 40-59% bilateral ICA stenosis with antegrade flow in the vertebral arteries.  Echo showed EF 60% with no PFO or ASD.  She was diagnosed with TIA and started on ASA.  She has subsequently had several other similar episodes.  CT head and MRI brain  from 06/02/12, CT head and MRI of brain from 10/28/12, CT head and MRI brain from 06/02/15, and MRI brain from 10/03/15 were all personally reviewed and were unremarkable.  MRI/MRA/MRV of head from 02/28/13 were unremarkable.   She has left sided headache without the weakness as well.  It is severe and associated with nausea and photophobia.  They last 1 to 2 days. They occur 2 to 3 days per week.  She has 25 headache days per month. Triggers/exacerbating factors:  Dairy, cigarette smoke Relieving factors:  rest Activity:  Cannot function with severe headache.   Past NSAIDS:  Naproxen (GI upset), ibuprofen Past analgesics:  Percocet (hives), Norco, tramadol (hot flashes) Past abortive triptans:  Relpax Past muscle relaxants: no Past anti-emetic:  Phenergan 25mg  Past antihypertensive medications:  verapamil 24hr 180mg , HCTZ, lisinopril Past antidepressant medications:  nortriptyline 50mg  Past anticonvulsant medications:  Depakote, zonisamide, topiramate 100mg  twice daily (effective but caused memory problems and paresthesias) Past vitamins/Herbal/Supplements:  no Other past therapy:  Botox (2 rounds)   Family history of headache:  mother  PAST MEDICAL HISTORY: Past Medical History:  Diagnosis Date  . Acne   . Anemia    takes iron supplement  . Anxiety   . Asthma   . Chronic bronchitis ([place])   . Depression   . Family history of adverse reaction to anesthesia    pt's mother has hx. of post-op N/V  . GERD (gastroesophageal reflux disease)   . History of blood transfusion   . History of TIA (transient ischemic attack)    x 2; left-sided weakness  . Hypercholesterolemia    no current med.  . Hypertension    states under control with meds., has been on med. since 06/2011  .  Keloid [id number]   umbilical  . Migraines   . Osteoarthritis   . Paget disease of bone   . PONV (postoperative nausea and vomiting)    states severe  . Seasonal allergies   . Stroke ([place]) 10/27/2012   TIA   1-  10/29/11-weakness left side    MEDICATIONS: Current Outpatient Prescriptions on File Prior to Visit  Medication Sig Dispense Refill  . albuterol (PROVENTIL HFA;VENTOLIN HFA) 108 (90 BASE) MCG/ACT inhaler Inhale 1-2 puffs into the lungs every 6 (six) hours as needed for wheezing or shortness of breath.     Marland Kitchen albuterol (PROVENTIL) (5 MG/ML) 0.5% nebulizer solution Take 2.5 mg by nebulization every 6 (six) hours as needed for wheezing or shortness of breath.    Marland Kitchen aspirin EC 325 MG tablet Take 1 tablet (325 mg total) by mouth 2 (two) times daily. (Patient taking differently: Take 325 mg by mouth daily. ) 30 tablet 0  . atenolol (TENORMIN) 100 MG tablet Take 1 tablet (100 mg total) by mouth daily. 30 tablet 3  . busPIRone (BUSPAR) 15 MG tablet Take 15 mg by mouth 2 (two) times daily.     . cetirizine (ZYRTEC) 10 MG chewable tablet Chew 10 mg by mouth at bedtime.     . ferrous sulfate 325 (65 FE) MG tablet Take 325 mg by mouth 2 (two) times daily with a meal.    . Fluticasone-Salmeterol (ADVAIR) 250-50 MCG/DOSE AEPB Inhale 1 puff into the lungs 2 (two) times daily.    . hydrochlorothiazide (MICROZIDE) 12.5 MG capsule Take 12.5 mg by mouth daily.    Marland Kitchen HYDROcodone-acetaminophen (NORCO) 7.5-325 MG tablet Take 1 tablet by mouth every 6 (six) hours as needed for moderate pain.     . mometasone (NASONEX) 50 MCG/ACT nasal spray Place 2 sprays into the nose daily.    . montelukast (SINGULAIR) 10 MG tablet Take 10 mg by mouth at bedtime.    . ondansetron (ZOFRAN) 8 MG tablet Take 8 mg by mouth every 8 (eight) hours as needed for nausea or vomiting.    . pantoprazole (PROTONIX) 40 MG tablet Take 40 mg by mouth 2 (two) times daily before a meal.     . rosuvastatin (CRESTOR) 10 MG tablet Take 10 mg by mouth daily.    . sertraline (ZOLOFT) 100 MG tablet Take 100-200 mg by mouth 2 (two) times daily. Take 100 mg in the am and 200 mg in the pm    . tiZANidine (ZANAFLEX) 4 MG tablet Take 4 mg by mouth at bedtime as  needed for muscle spasms.     . Vitamin D, Ergocalciferol, (DRISDOL) 50000 UNITS CAPS Take 50,000 Units by mouth every 7 (seven) days. mondays     No current facility-administered medications on file prior to visit.     ALLERGIES: Allergies  Allergen Reactions  . Lisinopril Swelling and Other (See Comments)    SWELLING FACE/LIPS/NECK  . Topamax [Topiramate] Other (See Comments)    SHORT-TERM MEMORY LOSS  . Latex Rash  . Oxycodone-Acetaminophen Itching and Rash    Also percocet    FAMILY HISTORY: Family History  Problem Relation Age of Onset  . Anesthesia problems Mother     post-op N/V  . Anxiety disorder Father   . Depression Father   . Cervical cancer Maternal Grandmother   . Cancer Maternal Grandmother     mouth  . Heart attack Maternal Grandmother 55  . Hypertension Brother   . Heart failure Brother  ICD    SOCIAL HISTORY: Social History   Social History  . Marital status: Divorced    Spouse name: N/A  . Number of children: 2  . Years of education: N/A   Occupational History  . health advisor Unemployed    [employer]   Social History Main Topics  . Smoking status: Former Smoker    Packs/day: 0.00    Quit date: 07/20/2004  . Smokeless tobacco: Never Used  . Alcohol use No  . Drug use: No  . Sexual activity: Yes    Partners: Male   Other Topics Concern  . Not on file   Social History Narrative   Lives in [place] with daughter      Servando Snare (c) [phone]   nigel       (c) [phone]    REVIEW OF SYSTEMS: Constitutional: No fevers, chills, or sweats, no generalized fatigue, change in appetite Eyes: No visual changes, double vision, eye pain Ear, nose and throat: No hearing loss, ear pain, nasal congestion, sore throat Cardiovascular: No chest pain, palpitations Respiratory:  No shortness of breath at rest or with exertion, wheezes GastrointestinaI: upset, rectal bleeding (seeing GI) Genitourinary:  No dysuria, urinary retention or  frequency Musculoskeletal:  Generalized pain Integumentary: easy bruising Neurological: as above Psychiatric: No depression, insomnia, anxiety Endocrine: No palpitations, fatigue, diaphoresis, mood swings, change in appetite, change in weight, increased thirst Hematologic/Lymphatic:  No purpura, petechiae. Allergic/Immunologic: no itchy/runny eyes, nasal congestion, recent allergic reactions, rashes  PHYSICAL EXAM: Vitals:   07/10/16 0937  BP: 126/84  Pulse: 68   General: No acute distress.  Patient appears well-groomed.  Morbidly obese body habitus. Head:  Normocephalic/atraumatic Eyes:  Fundi examined but not visualized Neck: supple, no paraspinal tenderness, full range of motion Heart:  Regular rate and rhythm Lungs:  Clear to auscultation bilaterally Back: No paraspinal tenderness Neurological Exam: alert and oriented to person, place, and time. Attention span and concentration intact, recent and remote memory intact, fund of knowledge intact.  Speech fluent and not dysarthric, language intact.  CN II-XII intact. Bulk and tone normal, muscle strength 5/5 throughout.  Sensation to light touch  intact.  Deep tendon reflexes 2+ throughout.  Finger to nose testing intact.  Gait antalgic with cane.  IMPRESSION: Chronic migraine, likely cervicogenic component (I wouldn't be surprised that she has arthritic changes in the cervical spine) Hemiplegic migraine Morbid obesity  PLAN: 1.  Will increase gabapentin to 600mg  twice daily.  She will contact us in 5 weeks with update.  If headaches not better, then would switch to extended release topiramate.  Extended release topiramate less likely causes memory problems and paresthesias. 2.  Decrease atenolol back down to 50mg  daily. 3.  Try to limit pain relievers to no more than 2 days out of the week.  Would avoid NSAIDs at this time due to GI upset, rectal bleeding and bruising.  She has appointment with GI and recommended speaking with her  hematologist regarding bruising. 4.  Hydration, weight loss, supplements 5.  Follow up in 3 months.  26 minutes spent face to face with patient, over 50% spent discussing symptoms, management.  Metta Clines, DO  CC:  Antony Blackbird, MD

## 2016-07-10 NOTE — Progress Notes (Signed)
Chart forwarded.  

## 2016-07-10 NOTE — Patient Instructions (Addendum)
1.  We will increase gabapentin (300mg  capsule) to 600mg  twice daily.  Take as follows:         300mg  (1 capsule)  in morning and 600mg  (2 capsules) at bedtime for 7 days, then         600mg  (2 capsules) in morning and 600mg  (2 capsules) at bedtime         Contact me in 5 weeks with update and we can try a different medication if needed. 2.  Continue to use Zofran as needed and tizanidine for neck pain 3.  Try to limit use of pain relievers to no more than 2 days out of the week 4.  Supplements:  Magnesium citrate 400mg  to 600mg  daily, riboflavin 400mg , Coenzyme Q 10 100mg  three times daily 5.  Hydration with water, diet 6.  Follow up in 3 months but CONTACT ME IN 5 WEEKS

## 2016-07-15 ENCOUNTER — Telehealth: Payer: Self-pay | Admitting: Neurology

## 2016-07-15 NOTE — Telephone Encounter (Signed)
In the beginning, the headaches will get worse because she is having to force herself to not take NSAIDs everyday.  However, the only way to get better is to stop overusing the NSAIDs.  To help ease it better, she can slowly titrate the use of NSAIDs down:  Take NSAIDs no more than 6 days out of the week for one week  Then take no more than 5 days out of the week for one week  Then take no more than 4 days out of the week for one week  Then take no more than 3 days out of the week for one week  Then take no more than 2 days out of the week.

## 2016-07-15 NOTE — Telephone Encounter (Signed)
Sent via [employer]. VM also left.

## 2016-07-15 NOTE — Telephone Encounter (Signed)
She should check with GI if it's okay to take the NSAIDs.  If she isn't, then the only medication that I can recommend to take for headache would be Tylenol

## 2016-07-15 NOTE — Telephone Encounter (Signed)
Medication was changed on 07/10/16, per A&P from that date,   "1.  Will increase gabapentin to 600mg  twice daily.  She will contact us in 5 weeks with update.  If headaches not better, then would switch to extended release topiramate.  Extended release topiramate less likely causes memory problems and paresthesias. 2.  Decrease atenolol back down to 50mg  daily."   Pt stating that headaches are increasing in intensity. Pt is trying to avoid Nsaids, but is having a hard time managing daily life w/o taking NSAIDs. Please advise.

## 2016-07-15 NOTE — Telephone Encounter (Signed)
Wanda [name] 07/26/1966. She LMOM regarding her headaches. She said they are getting worse. She said it has only been 1 week but they are worse. Her # is [id number]. Thank you

## 2016-07-16 NOTE — Telephone Encounter (Signed)
Message sent via my chart

## 2016-07-23 NOTE — Progress Notes (Signed)
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  HISTORY OF PRESENTING ILLNESS:  Wanda [name] is a wonderful 50 y.o. female who has been referred to [place] by Dr.FULP, CAMMIE, MD for evaluation and management of rib abnormality noted on the CT scan of the chest recently - concerning for possible metastatic disease.  Patient has a history of TIA,/complex migraine with left sided weakness in past, anxiety, depression, dyslipidemia, GERD, hypertension, severe osteoarthritis (has had 4 back surgeries) was seen in the emergency room on 12/25/2015 with some right sided chest pain that sounded somewhat pleuritic in nature as well as some back pain. No associated rash or injury. No overt cough, fever, hemoptysis or overt shortness of breath. She notes that she feels subjectively short of breath due to the pain. Had not had any previous history of DVT or pulmonary embolism.  In the ED the patient had a CTA of the chest on 12/25/2015 that was negative for pulmonary embolism. She was noted to have cortical thickening of the right posterior seventh rib which was new compared to previous CT scan on 05/03/2013. 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 She was noted to have significant multilevel bridging osteophytes in her thoracic spine with degenerative changes along the right seventh costovertebral junction. Recent labs have also shown an elevated alkaline phosphatase.   She reports having had issues with Iron deficiency anemia in early 2016 and reports having had an EGD/Colonoscopy with Dr Herbie Baltimore [facility]in [facility])(i which she reports were normal. She notes that she was recommended a capsule endoscopy  which she has not yet scheduled. Currently is on oral iron replacement FESo4 1 tab po BID  Last MMG about 3 yrs ago -notes that she is due for another now and shall be scheduling with through her PCP. Hysterectomy in 2005.  Recently had bothersome diarrhea that has now resolved with immodium and probiotics. Notes some epigastric discomfort.  Has had RLE leg swelling in 10/2015 after her rt knee surgery and had an LE venous US which was neg for DVT.   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 She notes that her chest wall pain is persistent. Ankle has healed up. She has decided to proceed with bisphosphonates to treat her pagets disease.    MEDICAL HISTORY:  Past Medical History:  Diagnosis Date  . Acne   . Anemia    takes iron supplement  . Anxiety   . Asthma   . Chronic bronchitis ([place])   . Depression   . Family history of adverse reaction to anesthesia    pt's mother has hx. of post-op N/V  . GERD (gastroesophageal reflux disease)   . History of blood transfusion   . History of TIA (transient ischemic attack)    x 2; left-sided weakness  . Hypercholesterolemia    no current med.  . Hypertension    states under control with meds., has been on med. since 06/2011  . Kel[place] [id number]   umbilical  . Migraines   . Osteoarthritis   . Paget disease of bone   . PONV (postoperative nausea and vomiting)    states severe  . Seasonal allergies   . Stroke [facility])  10/27/2012   TIA   1- 10/29/11-weakness left side    SURGICAL HISTORY: Past Surgical History:  Procedure Laterality Date  . CARPAL TUNNEL RELEASE Left 12/2013  . CARPAL TUNNEL RELEASE Right 03/29/2015  . CHOLECYSTECTOMY  08/27/2009  . DIAGNOSTIC LAPAROSCOPY  early 2005  . ETHMOIDECTOMY Bilateral 04/30/2006  . FUNCTIONAL ENDOSCOPIC SINUS SURGERY  04/30/2006  . HYSTEROSCOPY W/D&C  04/21/2002   with lysis of adhesions  . KNEE ARTHROSCOPY Left 02/2000  . LAMINOTOMY Left 03/27/2010    L5-S1; microdissection  . LAMINOTOMY Bilateral 07/12/2007   L4-5  . LUMBAR FUSION Left 07/18/2010   L4-5; L5-S1  . LUMBAR FUSION  04/10/2014   L3-4, L4-5, L5-S1  . NASAL TURBINATE REDUCTION  04/30/2006   inferior  . TOTAL ABDOMINAL HYSTERECTOMY  11/01/2003   with lysis of adhesions; ovaries retained  . TOTAL KNEE ARTHROPLASTY Left 12/31/2014   Procedure: TOTAL LEFT KNEE ARTHROPLASTY;  Surgeon: Frederik Pear, MD;  Location: [place];  Service: Orthopedics;  Laterality: Left;  . TOTAL KNEE ARTHROPLASTY Right 11/11/2015   Procedure: TOTAL KNEE ARTHROPLASTY;  Surgeon: Frederik Pear, MD;  Location: [place];  Service: Orthopedics;  Laterality: Right;  Marland Kitchen VAGINAL HYSTERECTOMY     for DUB in 10/2003; partial    SOCIAL HISTORY: Social History   Social History  . Marital status: Divorced    Spouse name: N/A  . Number of children: 2  . Years of education: N/A   Occupational History  . health advisor Unemployed    [employer]   Social History Main Topics  . Smoking status: Former Smoker    Packs/day: 0.00    Quit date: 07/20/2004  . Smokeless tobacco: Never Used  . Alcohol use No  . Drug use: No  . Sexual activity: Yes    Partners: Male   Other Topics Concern  . Not on file   Social History Narrative   Lives in [place] with daughter      Servando Snare (c) [phone]   nigel       (c) [phone]    FAMILY HISTORY: Family History  Problem Relation Age of Onset  . Anesthesia problems Mother     post-op N/V  . Anxiety disorder Father   . Depression Father   . Cervical cancer Maternal Grandmother   . Cancer Maternal Grandmother     mouth  . Heart attack Maternal Grandmother 55  . Hypertension Brother   . Heart failure Brother     ICD    ALLERGIES:  is allergic to lisinopril; topamax [topiramate]; latex; and oxycodone-acetaminophen.  MEDICATIONS:  Current Outpatient Prescriptions  Medication Sig Dispense Refill  . albuterol (PROVENTIL HFA;VENTOLIN HFA) 108 (90 BASE) MCG/ACT inhaler  Inhale 1-2 puffs into the lungs every 6 (six) hours as needed for wheezing or shortness of breath.     Marland Kitchen albuterol (PROVENTIL) (5 MG/ML) 0.5% nebulizer solution Take 2.5 mg by nebulization every 6 (six) hours as needed for wheezing or shortness of breath.    Marland Kitchen aspirin EC 325 MG tablet Take 1 tablet (325 mg total) by mouth 2 (two) times daily. (Patient taking differently: Take 325 mg by mouth daily. ) 30 tablet 0  . busPIRone (BUSPAR) 15 MG tablet Take 15 mg by mouth 2 (two) times daily.     . cetirizine (ZYRTEC) 10 MG chewable tablet Chew 10 mg by mouth at bedtime.     . ferrous sulfate 325 (65 FE) MG tablet Take 325 mg by mouth 2 (two) times daily with a  meal.    . Fluticasone-Salmeterol (ADVAIR) 250-50 MCG/DOSE AEPB Inhale 1 puff into the lungs 2 (two) times daily.    . hydrochlorothiazide (MICROZIDE) 12.5 MG capsule Take 12.5 mg by mouth daily.    Marland Kitchen HYDROcodone-acetaminophen (NORCO) 7.5-325 MG tablet Take 1 tablet by mouth every 6 (six) hours as needed for moderate pain.     . mometasone (NASONEX) 50 MCG/ACT nasal spray Place 2 sprays into the nose daily.    . montelukast (SINGULAIR) 10 MG tablet Take 10 mg by mouth at bedtime.    . ondansetron (ZOFRAN) 8 MG tablet Take 8 mg by mouth every 8 (eight) hours as needed for nausea or vomiting.    . pantoprazole (PROTONIX) 40 MG tablet Take 40 mg by mouth 2 (two) times daily before a meal.     . rosuvastatin (CRESTOR) 10 MG tablet Take 10 mg by mouth daily.    . sertraline (ZOLOFT) 100 MG tablet Take 100-200 mg by mouth 2 (two) times daily. Take 100 mg in the am and 200 mg in the pm    . tiZANidine (ZANAFLEX) 4 MG tablet Take 4 mg by mouth at bedtime as needed for muscle spasms.     . Vitamin D, Ergocalciferol, (DRISDOL) 50000 UNITS CAPS Take 50,000 Units by mouth every 7 (seven) days. mondays    . atenolol (TENORMIN) 50 MG tablet Take 1 tablet (50 mg total) by mouth daily. 30 tablet 0  . gabapentin (NEURONTIN) 300 MG capsule Take 2 capsules (600 mg  total) by mouth 2 (two) times daily. 120 capsule 0  . Probiotic Product (ALIGN PO) Take by mouth.     No current facility-administered medications for this visit.     REVIEW OF SYSTEMS:    10 Point review of Systems was done is negative except as noted above.  PHYSICAL EXAMINATION: ECOG PERFORMANCE STATUS: 2 - Symptomatic, <50% confined to bed  . Vitals:   05/04/16 1321  BP: (!) 136/59  Pulse: 84  Resp: 16  Temp: 98.5 F (36.9 C)   Filed Weights   05/04/16 1321  Weight: 254 lb 14.4 oz (115.6 kg)   .Body mass index is 41.14 kg/m.  GENERAL:alert, in no acute distress and comfortable SKIN: skin color, texture, turgor are normal, no rashes or significant lesions EYES: normal, conjunctiva are pink and non-injected, sclera clear OROPHARYNX:no exudate, no erythema and lips, buccal mucosa, and tongue normal  NECK: supple, no JVD, thyroid normal size, non-tender, without nodularity LYMPH:  no palpable lymphadenopathy in the cervical, axillary or inguinal LUNGS: clear to auscultation with normal respiratory effort, TTP over the rt posterior lower chest wall. HEART: regular rate & rhythm,  no murmurs and no lower extremity edema ABDOMEN: abdomen soft, non-tender, normoactive bowel sounds  Musculoskeletal: no cyanosis of digits and no clubbing  PSYCH: alert & oriented x 3 with fluent speech NEURO: no focal motor/sensory deficits  LABORATORY DATA:  I have reviewed the data as listed  . CBC Latest Ref Rng & Units 05/09/2016 05/04/2016 01/22/2016  WBC 4.0 - 10.5 K/uL 6.8 7.3 7.7  Hemoglobin 12.0 - 15.0 g/dL 12.6 13.2 12.9  Hematocrit 36.0 - 46.0 % 39.2 41.1 39.9  Platelets 150 - 400 K/uL 161 176 184   . CBC    Component Value Date/Time   WBC 6.8 05/09/2016 1400   RBC 4.46 05/09/2016 1400   HGB 12.6 05/09/2016 1400   HGB 13.2 05/04/2016 1245   HCT 39.2 05/09/2016 1400   HCT 41.1 05/04/2016 1245  PLT 161 05/09/2016 1400   PLT 176 05/04/2016 1245   MCV 87.9 05/09/2016  1400   MCV 85.9 05/04/2016 1245   MCH 28.3 05/09/2016 1400   MCHC 32.1 05/09/2016 1400   RDW 14.7 05/09/2016 1400   RDW 15.5 (H) 05/04/2016 1245   LYMPHSABS 1.8 05/04/2016 1245   MONOABS 0.4 05/04/2016 1245   EOSABS 0.2 05/04/2016 1245   BASOSABS 0.1 05/04/2016 1245     . CMP Latest Ref Rng & Units 05/09/2016 05/04/2016 01/22/2016  Glucose 65 - 99 mg/dL 106(H) 109 103  BUN 6 - 20 mg/dL 10 11.5 8.9  Creatinine 0.44 - 1.00 mg/dL 0.67 0.7 0.8  Sodium 135 - 145 mmol/L 143 145 144  Potassium 3.5 - 5.1 mmol/L 3.4(L) 3.7 3.8  Chloride 101 - 111 mmol/L 106 - -  CO2 22 - 32 mmol/L 30 28 29   Calcium 8.9 - 10.3 mg/dL 9.4 9.7 9.7  Total Protein 6.4 - 8.3 g/dL - 7.3 7.6  Total Bilirubin 0.20 - 1.20 mg/dL - 0.32 <0.30  Alkaline Phos 40 - 150 U/L - 158(H) 189(H)  AST 5 - 34 U/L - 22 26  ALT 0 - 55 U/L - 20 25   Component     Latest Ref Rng 01/22/2016  IgG (Immunoglobin G), Serum     700 - 1600 mg/dL 881  IgA/Immunoglobulin A, Serum     87 - 352 mg/dL 147  IgM, Qn, Serum     26 - 217 mg/dL 88  Total Protein     6.0 - 8.5 g/dL 6.9  Albumin SerPl Elph-Mcnc     2.9 - 4.4 g/dL 3.7  Alpha 1     0.0 - 0.4 g/dL 0.3  Alpha2 Glob SerPl Elph-Mcnc     0.4 - 1.0 g/dL 0.7  B-Globulin SerPl Elph-Mcnc     0.7 - 1.3 g/dL 1.2  Gamma Glob SerPl Elph-Mcnc     0.4 - 1.8 g/dL 1.0  M Protein SerPl Elph-Mcnc     Not Observed g/dL Not Observed  Globulin, Total     2.2 - 3.9 g/dL 3.2  Albumin/Glob SerPl     0.7 - 1.7 1.2  IFE 1      Comment  Please Note (HCV):      Comment  Iron     41 - 142 ug/dL 37 (L)  TIBC     236 - 444 ug/dL 315  UIBC     120 - 384 ug/dL 278  %SAT     21 - 57 % 12 (L)  Ig Kappa Free Light Chain     3.3 - 19.4 mg/L 17.9  Ig Lambda Free Light Chain     5.7 - 26.3 mg/L 9.6  Kappa/Lambda FluidC Ratio     0.26 - 1.65 1.86 (H)  Sed Rate     0 - 40 mm/hr 9  CRP     0.0 - 4.9 mg/L 4.9  Ferritin     9 - 269 ng/ml 134    RADIOGRAPHIC STUDIES: I have personally  reviewed the radiological images as listed and agreed with the findings in the report. US Carotid Bilateral  Result Date: 06/26/2016 CLINICAL DATA:  Carotid atherosclerosis, hypertension, hyperlipidemia EXAM: BILATERAL CAROTID DUPLEX ULTRASOUND TECHNIQUE: Pearline Cables scale imaging, color Doppler and duplex ultrasound were performed of bilateral carotid and vertebral arteries in the neck. COMPARISON:  None. FINDINGS: Criteria: Quantification of carotid stenosis is based on velocity parameters that correlate the residual internal carotid diameter with NASCET-based stenosis  levels, using the diameter of the distal internal carotid lumen as the denominator for stenosis measurement. The following velocity measurements were obtained: RIGHT ICA:  110/32 cm/sec CCA:  0000000 cm/sec SYSTOLIC ICA/CCA RATIO:  1.2 DIASTOLIC ICA/CCA RATIO:  1.5 ECA:  109/10 cm/sec LEFT ICA:  128/51 cm/sec CCA:  [id number] cm/sec SYSTOLIC ICA/CCA RATIO:  1.3 DIASTOLIC ICA/CCA RATIO:  2.1 ECA:  82/11 cm/sec RIGHT CAROTID ARTERY: Minor echogenic shadowing plaque formation. No hemodynamically significant right ICA stenosis, velocity elevation, or turbulent flow. Degree of narrowing less than 50%. RIGHT VERTEBRAL ARTERY:  Antegrade LEFT CAROTID ARTERY: Similar scattered minor echogenic plaque formation. No hemodynamically significant left ICA stenosis, velocity elevation, or turbulent flow. LEFT VERTEBRAL ARTERY:  Antegrade IMPRESSION: Minor carotid atherosclerosis. No hemodynamically significant ICA stenosis. Degree of narrowing less than 50% bilaterally. Patent antegrade vertebral flow bilaterally Electronically Signed   By: Jerilynn Mages.  Shick M.D.   On: 06/26/2016 08:11    ASSESSMENT & PLAN:   50 yo with   1) Rt chest wall pain with CTA chest showing Cortical thickening of the right posterior seventh rib which appears to be new. In the setting of elevated alkaline phosphatase levels this could certain represent pagets disease. Myeloma panel unrevealing with  no M protein spike, SFLC wnl Part of the pain could be from thoracic radiculopathy related to significant osteoarthritis of her t-spine.  CT abdomen pelvis showed no overt evidence of metastatic disease. Small 1.4 cm right pelvic lymph node appears to be nonspecifically enlarged but will need to be followed up.  No clinical or imaging evidence of a primary malignancy at this time. Plan -would recommend primary care physician to consider an MRI of the T-spine to rule out thoracic radiculopathy that could be causing her right-sided chest pain. -We had discussed about the possibility of Paget's disease. We discussed the treatment indication for Paget's disease including symptomatic Paget's disease or asymptomatic Paget's disease with serum alkaline phosphatase 2-4 times ULN, planned surgery at active pagetic site, hypercalcemia associated with this, pagetic disease at the site where complications could occur. -patient notes that her chest wall tenderness and discomfort persists and she would like to proceed with treatment. -we will set her up for treatment with Zolendronic acid. -She is on high-dose ergocalciferol vitamin D replacement and was also informed to take adequate calcium intake. -She will follow-up with her primary care physician to consider further imaging of her T-spine to rule out other pathology for her chest wall pain. -MMG 01/27/2016 negative. -would consider repeat CT of the abdomen pelvis to reevaluate the small pelvic lymph node in about 4 months  2) h/o Iron deficiency anemia - ferritin currently 134 and hgb near normal at 11.8 with normal MCV. Element of blood loss anemia related to her knee surgery in April 2017 and last year. Patient has been on FESO4 1 tab po BID which has corrected most of her anemia. Plan -she reports that she was recommended a capsule endoscopy and should f/u with GI recommendations. -might reduce FESO4 to 1 tab po daily and continue till iron sat close to  30% . -reasonable to take a daily multivitamin.  Continue f/u with PCP  Schedule for Zometa for paget's disease CT abd/pelvis in 4 months RTC with Dr Irene Limbo in 4 months with labs  . Orders Placed This Encounter  Procedures  . CT ABDOMEN PELVIS W CONTRAST    Standing Status:   Future    Standing Expiration Date:   08/04/2017    Order Specific  Question:   Reason for exam:    Answer:   pelvic lymphadenopathy followup    Order Specific Question:   Is the patient pregnant?    Answer:   No    Order Specific Question:   Preferred imaging location?    Answer:   [facility]  . CBC & Diff and Retic    Standing Status:   Future    Standing Expiration Date:   05/04/2017  . Comprehensive metabolic panel    Standing Status:   Future    Standing Expiration Date:   05/04/2017  . Ferritin    Standing Status:   Future    Standing Expiration Date:   05/04/2017     All of the patients questions were answered with apparent satisfaction. The patient knows to call the clinic with any problems, questions or concerns.  I spent 25 minutes counseling the patient face to face. The total time spent in the appointment was 25 minutes and more than 50% was on counseling and direct patient cares.    Sullivan Lone MD [place] AAHIVMS [facility] Hematology/Oncology Physician [facility]  (Office):       [phone] (Work cell):  [phone] (Fax):           [phone]  07/23/2016 2:10 PM

## 2016-08-06 ENCOUNTER — Encounter: Payer: Self-pay | Admitting: Neurology

## 2016-08-07 NOTE — Telephone Encounter (Signed)
Wanda [name] 10/31/1965. She would like you to please call her regarding her E mail that was sent to Dr. Tomi Likens on 08/06/16. Her # is [id number]. Thank you

## 2016-08-10 ENCOUNTER — Telehealth: Payer: Self-pay | Admitting: Neurology

## 2016-08-10 ENCOUNTER — Other Ambulatory Visit: Payer: Self-pay

## 2016-08-10 MED ORDER — TOPIRAMATE ER 50 MG PO CAP24: 1.0000 | ORAL_CAPSULE | Freq: Every day | ORAL | 0 refills | Status: DC

## 2016-08-10 NOTE — Telephone Encounter (Signed)
I would like to appeal this.  She has failed multiple migraine preventatives.  Topamax was effective but caused paresthesias and memory problems.  Therefore, I would like her to tryTrokendi (or extended release topiramate) has been shown to less likely cause these side effects.

## 2016-08-10 NOTE — Telephone Encounter (Signed)
Called patient. Gave instructions per Dr. Georgie Chard previous MyChart message. Sent Rx. Confirmed pharmacy on file. Patient verbalized understanding.

## 2016-08-10 NOTE — Telephone Encounter (Signed)
The medication that we called in is not covered under patient ins please call in something else and call patient at [phone]

## 2016-08-10 NOTE — Telephone Encounter (Signed)
Wanda [name] 06/23/1966. [facility] will fax over a Prior authorization for her medication that you and her had discussed earlier. She said without Insurance it is $400. She said they should be faxing that over. Thank you

## 2016-08-11 NOTE — Telephone Encounter (Signed)
Returned call. No answer. Left vmail to confirm her message was received and will work on [place].

## 2016-08-14 NOTE — Telephone Encounter (Signed)
Spoke w/ [facility]. Answered additional questions for Trokendi PA. [id number] still in review.

## 2016-08-14 NOTE — Telephone Encounter (Signed)
Spoke to patient.  Gave up date per previous note.  Patient verbalized understanding; still waiting on PA result.

## 2016-08-14 NOTE — Telephone Encounter (Signed)
Patient [phone] calling to check the prior auth  Please call her back to give her a update on trokendi

## 2016-08-17 ENCOUNTER — Other Ambulatory Visit: Payer: Self-pay | Admitting: Neurology

## 2016-08-17 ENCOUNTER — Telehealth: Payer: Self-pay | Admitting: Neurology

## 2016-08-17 NOTE — Telephone Encounter (Signed)
Wanda [name] 06/21/1966. She was calling regarding her medication and the denial from [employer]. Her # [id number]. Thank you

## 2016-08-18 ENCOUNTER — Ambulatory Visit (INDEPENDENT_AMBULATORY_CARE_PROVIDER_SITE_OTHER): Payer: Medicare Other | Admitting: Neurology

## 2016-08-18 ENCOUNTER — Other Ambulatory Visit: Payer: Self-pay | Admitting: *Deleted

## 2016-08-18 DIAGNOSIS — G43709 Chronic migraine without aura, not intractable, without status migrainosus: Secondary | ICD-10-CM | POA: Diagnosis not present

## 2016-08-18 DIAGNOSIS — IMO0002 Reserved for concepts with insufficient information to code with codable children: Secondary | ICD-10-CM

## 2016-08-18 MED ORDER — KETOROLAC TROMETHAMINE 60 MG/2ML IM SOLN
60.0000 mg | Freq: Once | INTRAMUSCULAR | Status: AC
  Administered 2016-08-18: 60 mg via INTRAMUSCULAR

## 2016-08-18 MED ORDER — METOCLOPRAMIDE HCL 5 MG/ML IJ SOLN
10.0000 mg | Freq: Once | INTRAVENOUS | Status: AC
  Administered 2016-08-18: 10 mg via INTRAMUSCULAR

## 2016-08-18 MED ORDER — GABAPENTIN 300 MG PO CAPS: 600.0000 mg | ORAL_CAPSULE | Freq: Two times a day (BID) | ORAL | 3 refills | Status: DC

## 2016-08-18 MED ORDER — DIPHENHYDRAMINE HCL 50 MG/ML IJ SOLN
25.0000 mg | Freq: Once | INTRAMUSCULAR | Status: AC
  Administered 2016-08-18: 25 mg via INTRAMUSCULAR

## 2016-08-18 NOTE — Telephone Encounter (Signed)
Spoke to patient per previous phone note. Thanks

## 2016-08-18 NOTE — Progress Notes (Signed)
Headache cocktail injection to right ventrogluteal with no apparent complications.

## 2016-08-18 NOTE — Telephone Encounter (Signed)
Rx sent 

## 2016-08-18 NOTE — Telephone Encounter (Signed)
Advised patient yesterday, aware of denial. Submitted addt'l info, PA still under review. Waiting on fax.  Called patient. Advised received fax; gave [facility] same addt'l info as before b/c case had to be reopened. Still waiting on response. Patient says she is very aware of the process b/c she has been speaking w/ [facility] also. Having daily HA's. Will get driver to bring her in today for HA cocktail before 3:30pm.

## 2016-08-18 NOTE — Telephone Encounter (Signed)
PT called in regards to the [facility] denial and wanted to know if anything has been solved yet/Dawn CB# [phone]

## 2016-08-19 ENCOUNTER — Other Ambulatory Visit: Payer: Self-pay

## 2016-08-19 MED ORDER — TIZANIDINE HCL 4 MG PO TABS: 4.0000 mg | ORAL_TABLET | Freq: Every evening | ORAL | 0 refills | Status: DC | PRN

## 2016-08-19 MED ORDER — ONDANSETRON HCL 8 MG PO TABS
8.0000 mg | ORAL_TABLET | Freq: Three times a day (TID) | ORAL | 0 refills | Status: DC | PRN
Start: 2016-08-19 — End: 2016-09-16

## 2016-08-25 ENCOUNTER — Telehealth: Payer: Self-pay | Admitting: Neurology

## 2016-08-25 NOTE — Telephone Encounter (Signed)
PT called regarding update on authorization for her medication/Dawn CB# [phone]

## 2016-08-27 NOTE — Telephone Encounter (Signed)
PA appeal still under review per [facility]/ Prior Auth. Ph#[phone] Called patient left vmail w/ status.

## 2016-08-28 ENCOUNTER — Telehealth: Payer: Self-pay | Admitting: Neurology

## 2016-08-28 NOTE — Telephone Encounter (Signed)
Called patient to inform samples available. No answer. Left vmail.

## 2016-08-28 NOTE — Telephone Encounter (Signed)
Wanda [name] 06/14/1966. She was returning your call. She said she will come by to pick the samples up. Thank you

## 2016-08-28 NOTE — Telephone Encounter (Signed)
Spoke to patient. She will be by to p/u Trokendi XR 50mg  samples.

## 2016-09-02 ENCOUNTER — Encounter (HOSPITAL_COMMUNITY): Payer: Self-pay

## 2016-09-02 ENCOUNTER — Ambulatory Visit (HOSPITAL_COMMUNITY)
Admission: RE | Admit: 2016-09-02 | Discharge: 2016-09-02 | Disposition: A | Discharge status: Home / Self Care | Payer: Medicare Other | Source: Ambulatory Visit | Attending: Hematology | Admitting: Hematology

## 2016-09-02 DIAGNOSIS — M889 Osteitis deformans of unspecified bone: Secondary | ICD-10-CM | POA: Diagnosis present

## 2016-09-02 DIAGNOSIS — R748 Abnormal levels of other serum enzymes: Secondary | ICD-10-CM

## 2016-09-02 DIAGNOSIS — K5730 Diverticulosis of large intestine without perforation or abscess without bleeding: Secondary | ICD-10-CM | POA: Diagnosis not present

## 2016-09-02 MED ORDER — IOPAMIDOL (ISOVUE-300) INJECTION 61%
INTRAVENOUS | Status: AC
  Filled 2016-09-02: qty 100

## 2016-09-02 MED ORDER — IOPAMIDOL (ISOVUE-300) INJECTION 61%
100.0000 mL | Freq: Once | INTRAVENOUS | Status: AC | PRN
  Administered 2016-09-02: 100 mL via INTRAVENOUS

## 2016-09-02 MED ORDER — SODIUM CHLORIDE 0.9 % IJ SOLN
INTRAMUSCULAR | Status: AC
  Filled 2016-09-02: qty 50

## 2016-09-07 ENCOUNTER — Ambulatory Visit (HOSPITAL_BASED_OUTPATIENT_CLINIC_OR_DEPARTMENT_OTHER): Payer: Medicare Other | Admitting: Hematology

## 2016-09-07 ENCOUNTER — Encounter: Payer: Self-pay | Admitting: Hematology

## 2016-09-07 ENCOUNTER — Other Ambulatory Visit (HOSPITAL_BASED_OUTPATIENT_CLINIC_OR_DEPARTMENT_OTHER): Payer: Medicare Other

## 2016-09-07 VITALS — BP 123/46 | HR 96 | Temp 98.6°F | Resp 18 | Ht 66.0 in | Wt 250.4 lb

## 2016-09-07 DIAGNOSIS — M889 Osteitis deformans of unspecified bone: Secondary | ICD-10-CM | POA: Diagnosis not present

## 2016-09-07 DIAGNOSIS — R748 Abnormal levels of other serum enzymes: Secondary | ICD-10-CM

## 2016-09-07 LAB — CBC & DIFF AND RETIC
BASO%: 0.3 % (ref 0.0–2.0)
Basophils Absolute: 0 10*3/uL (ref 0.0–0.1)
EOS%: 2.3 % (ref 0.0–7.0)
Eosinophils Absolute: 0.2 10*3/uL (ref 0.0–0.5)
HCT: 38.9 % (ref 34.8–46.6)
HGB: 12.8 g/dL (ref 11.6–15.9)
Immature Retic Fract: 12.7 % — ABNORMAL HIGH (ref 1.60–10.00)
LYMPH#: 1.9 10*3/uL (ref 0.9–3.3)
LYMPH%: 26.8 % (ref 14.0–49.7)
MCH: 28.5 pg (ref 25.1–34.0)
MCHC: 32.9 g/dL (ref 31.5–36.0)
MCV: 86.6 fL (ref 79.5–101.0)
MONO#: 0.4 10*3/uL (ref 0.1–0.9)
MONO%: 5.5 % (ref 0.0–14.0)
NEUT#: 4.7 10*3/uL (ref 1.5–6.5)
NEUT%: 65.1 % (ref 38.4–76.8)
PLATELETS: 181 10*3/uL (ref 145–400)
RBC: 4.49 10*6/uL (ref 3.70–5.45)
RDW: 13.9 % (ref 11.2–14.5)
RETIC CT ABS: 87.56 10*3/uL (ref 33.70–90.70)
Retic %: 1.95 % (ref 0.70–2.10)
WBC: 7.2 10*3/uL (ref 3.9–10.3)

## 2016-09-07 LAB — COMPREHENSIVE METABOLIC PANEL
ALT: 28 U/L (ref 0–55)
ANION GAP: 8 meq/L (ref 3–11)
AST: 24 U/L (ref 5–34)
Albumin: 4.3 g/dL (ref 3.5–5.0)
Alkaline Phosphatase: 129 U/L (ref 40–150)
BUN: 14.4 mg/dL (ref 7.0–26.0)
CHLORIDE: 106 meq/L (ref 98–109)
CO2: 29 meq/L (ref 22–29)
CREATININE: 0.8 mg/dL (ref 0.6–1.1)
Calcium: 9.9 mg/dL (ref 8.4–10.4)
Glucose: 123 mg/dl (ref 70–140)
POTASSIUM: 3.5 meq/L (ref 3.5–5.1)
Sodium: 143 mEq/L (ref 136–145)
Total Bilirubin: 0.35 mg/dL (ref 0.20–1.20)
Total Protein: 7.5 g/dL (ref 6.4–8.3)

## 2016-09-07 LAB — FERRITIN: FERRITIN: 105 ng/mL (ref 9–269)

## 2016-09-08 ENCOUNTER — Ambulatory Visit (HOSPITAL_COMMUNITY)
Admission: EM | Admit: 2016-09-08 | Discharge: 2016-09-08 | Disposition: A | Discharge status: Home / Self Care | Payer: Medicare Other | Attending: Family Medicine | Admitting: Family Medicine

## 2016-09-08 ENCOUNTER — Encounter (HOSPITAL_COMMUNITY): Payer: Self-pay | Admitting: Emergency Medicine

## 2016-09-08 DIAGNOSIS — J208 Acute bronchitis due to other specified organisms: Secondary | ICD-10-CM | POA: Diagnosis not present

## 2016-09-08 DIAGNOSIS — R05 Cough: Secondary | ICD-10-CM

## 2016-09-08 DIAGNOSIS — R059 Cough, unspecified: Secondary | ICD-10-CM

## 2016-09-08 MED ORDER — PSEUDOEPH-BROMPHEN-DM 30-2-10 MG/5ML PO SYRP: 5.0000 mL | ORAL_SOLUTION | Freq: Four times a day (QID) | ORAL | 0 refills | Status: DC | PRN

## 2016-09-08 MED ORDER — METHYLPREDNISOLONE 4 MG PO TBPK: ORAL_TABLET | ORAL | 0 refills | Status: DC

## 2016-09-08 MED ORDER — AMOXICILLIN 875 MG PO TABS: 875.0000 mg | ORAL_TABLET | Freq: Two times a day (BID) | ORAL | 0 refills | Status: DC

## 2016-09-08 NOTE — ED Provider Notes (Signed)
CSN: [id number]     Arrival date & time 09/08/16  1152 History   None    Chief Complaint  Patient presents with  . Cough   (Consider location/radiation/quality/duration/timing/severity/associated sxs/prior Treatment) Patient c/o cough and uri sx's that have present for a week.  She states she gets pain in her ribs from coughing.  She c/o nonproductive cough and wheezing.  She states the cough is worse at HS.   The history is provided by the patient.  Cough  Cough characteristics:  Non-productive Severity:  Severe Onset quality:  Sudden Duration:  1 week Timing:  Constant Progression:  Worsening Chronicity:  New Smoker: no   Context: upper respiratory infection   Relieved by:  None tried Worsened by:  Deep breathing Ineffective treatments:  None tried Associated symptoms: chest pain and myalgias     Past Medical History:  Diagnosis Date  . Acne   . Anemia    takes iron supplement  . Anxiety   . Asthma   . Chronic bronchitis ([place])   . Depression   . Family history of adverse reaction to anesthesia    pt's mother has hx. of post-op N/V  . GERD (gastroesophageal reflux disease)   . History of blood transfusion   . History of TIA (transient ischemic attack)    x 2; left-sided weakness  . Hypercholesterolemia    no current med.  . Hypertension    states under control with meds., has been on med. since 06/2011  . Keloid [id number]   umbilical  . Migraines   . Osteoarthritis   . Paget disease of bone   . PONV (postoperative nausea and vomiting)    states severe  . Seasonal allergies   . Stroke ([place]) 10/27/2012   TIA   1- 10/29/11-weakness left side   Past Surgical History:  Procedure Laterality Date  . CARPAL TUNNEL RELEASE Left 12/2013  . CARPAL TUNNEL RELEASE Right 03/29/2015  . CHOLECYSTECTOMY  08/27/2009  . DIAGNOSTIC LAPAROSCOPY  early 2005  . ETHMOIDECTOMY Bilateral 04/30/2006  . FUNCTIONAL ENDOSCOPIC SINUS SURGERY  04/30/2006  . HYSTEROSCOPY W/D&C  04/21/2002   with  lysis of adhesions  . KNEE ARTHROSCOPY Left 02/2000  . LAMINOTOMY Left 03/27/2010   L5-S1; microdissection  . LAMINOTOMY Bilateral 07/12/2007   L4-5  . LUMBAR FUSION Left 07/18/2010   L4-5; L5-S1  . LUMBAR FUSION  04/10/2014   L3-4, L4-5, L5-S1  . NASAL TURBINATE REDUCTION  04/30/2006   inferior  . TOTAL ABDOMINAL HYSTERECTOMY  11/01/2003   with lysis of adhesions; ovaries retained  . TOTAL KNEE ARTHROPLASTY Left 12/31/2014   Procedure: TOTAL LEFT KNEE ARTHROPLASTY;  Surgeon: Frederik Pear, MD;  Location: [facility];  Service: Orthopedics;  Laterality: Left;  . TOTAL KNEE ARTHROPLASTY Right 11/11/2015   Procedure: TOTAL KNEE ARTHROPLASTY;  Surgeon: Frederik Pear, MD;  Location: [place];  Service: Orthopedics;  Laterality: Right;  Marland Kitchen VAGINAL HYSTERECTOMY     for DUB in 10/2003; partial   Family History  Problem Relation Age of Onset  . Anesthesia problems Mother     post-op N/V  . Anxiety disorder Father   . Depression Father   . Cervical cancer Maternal Grandmother   . Cancer Maternal Grandmother     mouth  . Heart attack Maternal Grandmother 55  . Hypertension Brother   . Heart failure Brother     ICD   Social History  Substance Use Topics  . Smoking status: Former Smoker    Packs/day: 0.00  Quit date: 07/20/2004  . Smokeless tobacco: Never Used  . Alcohol use No   OB History    No data available     Review of Systems  Constitutional: Positive for fatigue.  HENT: Positive for congestion.   Eyes: Negative.   Respiratory: Positive for cough.   Cardiovascular: Positive for chest pain.  Gastrointestinal: Negative.   Endocrine: Negative.   Genitourinary: Negative.   Musculoskeletal: Positive for myalgias.  Allergic/Immunologic: Negative.   Neurological: Negative.   Hematological: Negative.   Psychiatric/Behavioral: Negative.     Allergies  Lisinopril; Latex; and Oxycodone-acetaminophen  Home Medications   Prior to Admission medications   Medication Sig Start Date End Date  Taking? Authorizing Provider  albuterol (PROVENTIL HFA;VENTOLIN HFA) 108 (90 BASE) MCG/ACT inhaler Inhale 1-2 puffs into the lungs every 6 (six) hours as needed for wheezing or shortness of breath.     Historical Provider, MD  albuterol (PROVENTIL) (5 MG/ML) 0.5% nebulizer solution Take 2.5 mg by nebulization every 6 (six) hours as needed for wheezing or shortness of breath.    Historical Provider, MD  amoxicillin (AMOXIL) 875 MG tablet Take 1 tablet (875 mg total) by mouth 2 (two) times daily. 09/08/16   Lysbeth Penner, FNP  aspirin EC 325 MG tablet Take 1 tablet (325 mg total) by mouth 2 (two) times daily. Patient taking differently: Take 325 mg by mouth daily.  11/11/15   Leighton Parody, PA-C  atenolol (TENORMIN) 50 MG tablet Take 1 tablet (50 mg total) by mouth daily. 07/10/16   Pieter Partridge, DO  brompheniramine-pseudoephedrine-DM 30-2-10 MG/5ML syrup Take 5 mLs by mouth 4 (four) times daily as needed. 09/08/16   Lysbeth Penner, FNP  busPIRone (BUSPAR) 15 MG tablet Take 15 mg by mouth 2 (two) times daily.     Historical Provider, MD  cetirizine (ZYRTEC) 10 MG chewable tablet Chew 10 mg by mouth at bedtime.     Historical Provider, MD  ferrous sulfate 325 (65 FE) MG tablet Take 325 mg by mouth 2 (two) times daily with a meal.    Historical Provider, MD  Fluticasone-Salmeterol (ADVAIR) 250-50 MCG/DOSE AEPB Inhale 1 puff into the lungs 2 (two) times daily.    Historical Provider, MD  gabapentin (NEURONTIN) 300 MG capsule TAKE 2 CAPSULES(600 MG) BY MOUTH TWICE DAILY 08/18/16   Donika K Patel, DO  gabapentin (NEURONTIN) 300 MG capsule Take 2 capsules (600 mg total) by mouth 2 (two) times daily. 08/18/16   Pieter Partridge, DO  hydrochlorothiazide (MICROZIDE) 12.5 MG capsule Take 12.5 mg by mouth daily.    Historical Provider, MD  HYDROcodone-acetaminophen (NORCO) 7.5-325 MG tablet Take 1 tablet by mouth every 6 (six) hours as needed for moderate pain.     Historical Provider, MD  methylPREDNISolone  (MEDROL DOSEPAK) 4 MG TBPK tablet Take 6-5-4-3-2-1 po qd 09/08/16   Lysbeth Penner, FNP  mometasone (NASONEX) 50 MCG/ACT nasal spray Place 2 sprays into the nose daily.    Historical Provider, MD  montelukast (SINGULAIR) 10 MG tablet Take 10 mg by mouth at bedtime.    Historical Provider, MD  ondansetron (ZOFRAN) 8 MG tablet Take 1 tablet (8 mg total) by mouth every 8 (eight) hours as needed for nausea or vomiting. 08/19/16   Pieter Partridge, DO  pantoprazole (PROTONIX) 40 MG tablet Take 40 mg by mouth 2 (two) times daily before a meal.     Historical Provider, MD  Probiotic Product (ALIGN PO) Take by mouth.  Historical Provider, MD  rosuvastatin (CRESTOR) 10 MG tablet Take 10 mg by mouth daily.    Historical Provider, MD  sertraline (ZOLOFT) 100 MG tablet Take 100-200 mg by mouth 2 (two) times daily. Take 100 mg in the am and 200 mg in the pm    Historical Provider, MD  tiZANidine (ZANAFLEX) 4 MG tablet Take 1 tablet (4 mg total) by mouth at bedtime as needed for muscle spasms. 08/19/16   Pieter Partridge, DO  Topiramate ER (TROKENDI XR) 50 MG CP24 Take 1 capsule by mouth at bedtime. 08/10/16   Pieter Partridge, DO  Vitamin D, Ergocalciferol, (DRISDOL) 50000 UNITS CAPS Take 50,000 Units by mouth every 7 (seven) days. mondays    Historical Provider, MD   Meds Ordered and Administered this Visit  Medications - No data to display  BP 128/72 (BP Location: Right Arm)   Pulse 81   Temp 98 F (36.7 C) (Oral)   Resp 20   SpO2 97%  No data found.   Physical Exam  Constitutional: She is oriented to person, place, and time. She appears well-developed and well-nourished.  HENT:  Head: Normocephalic and atraumatic.  Right Ear: External ear normal.  Left Ear: External ear normal.  Mouth/Throat: Oropharynx is clear and moist.  Eyes: Conjunctivae and EOM are normal. Pupils are equal, round, and reactive to light.  Neck: Normal range of motion. Neck supple.  Cardiovascular: Normal rate, regular rhythm and  normal heart sounds.   Pulmonary/Chest: Effort normal and breath sounds normal.  Abdominal: Soft. Bowel sounds are normal.  Musculoskeletal: Normal range of motion.  Neurological: She is alert and oriented to person, place, and time.  Nursing note and vitals reviewed.   Urgent Care Course     Procedures (including critical care time)  Labs Review Labs Reviewed - No data to display  Imaging Review No results found.   Visual Acuity Review  Right Eye Distance:   Left Eye Distance:   Bilateral Distance:    Right Eye Near:   Left Eye Near:    Bilateral Near:         MDM   1. Cough   2. Acute bronchitis due to other specified organisms    Amoxicillin 875mg  one po bid x 7 days #14 Medrol dose pack as directed Bromfed DM 5 ml po q 4 hours prn #145ml  Push po fluids, rest, tylenol and motrin otc prn as directed for fever, arthralgias, and myalgias.  Follow up prn if sx's continue or persist.    Lysbeth Penner, FNP 09/08/16 1309

## 2016-09-08 NOTE — ED Triage Notes (Signed)
The patient presented to the [facility] with a cough and chest congestion x 3 days.

## 2016-09-12 NOTE — Progress Notes (Signed)
  HEMATOLOGY/ONCOLOGY CLINIC NOTE  Date of Service: .09/07/2016  Patient Care Team: Robert Thacker, MD as PCP - General (Family Medicine)  CHIEF COMPLAINTS/PURPOSE OF CONSULTATION:  Bony abnormality of the ribs- Paget's disease versus metastatic disease.  HISTORY OF PRESENTING ILLNESS:  Wanda [name] is a wonderful 51 y.o. female who has been referred to us by Dr.THACKER,ROBERT KELLER, MD for evaluation and management of rib abnormality noted on the CT scan of the chest recently - concerning for possible metastatic disease.  Patient has a history of TIA,/complex migraine with left sided weakness in past, anxiety, depression, dyslipidemia, GERD, hypertension, severe osteoarthritis (has had 4 back surgeries) was seen in the emergency room on 12/25/2015 with some right sided chest pain that sounded somewhat pleuritic in nature as well as some back pain. No associated rash or injury. No overt cough, fever, hemoptysis or overt shortness of breath. She notes that she feels subjectively short of breath due to the pain. Had not had any previous history of DVT or pulmonary embolism.  In the ED the patient had a CTA of the chest on 12/25/2015 that was negative for pulmonary embolism. She was noted to have cortical thickening of the right posterior seventh rib which was new compared to previous CT scan on 05/03/2013. 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 She was noted to have significant multilevel bridging osteophytes in her thoracic spine with degenerative changes along the right seventh costovertebral junction. Recent labs have also shown an elevated alkaline phosphatase.   She reports having had issues with Iron deficiency anemia in early 2016 and reports having had an EGD/Colonoscopy with Dr RObert Bucin ([facility])(i which she reports were normal. She notes that she was recommended a capsule  endoscopy which she has not yet scheduled. Currently is on oral iron replacement FESo4 1 tab po BID  Last MMG about 3 yrs ago -notes that she is due for another now and shall be scheduling with through her PCP. Hysterectomy in 2005.  Recently had bothersome diarrhea that has now resolved with immodium and probiotics. Notes some epigastric discomfort.  Has had RLE leg swelling in 10/2015 after her rt knee surgery and had an LE venous US which was neg for DVT.   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 She received 1 dose of Zometa and notes that she developed a fever requiring ED evaluation. ALK Po4 levels normalized. Has some issues with arthralgias and notes that chest wall pain is better. Notes financial stressors. Has URI which is resolving.    MEDICAL HISTORY:  Past Medical History:  Diagnosis Date  . Acne   . Anemia    takes iron supplement  . Anxiety   . Asthma   . Chronic bronchitis (HCC)   . Depression   . Family history of adverse reaction to anesthesia    pt's mother has hx. of post-op N/V  . GERD (gastroesophageal reflux disease)   . History of blood transfusion   . History of TIA (transient ischemic attack)    x 2; left-sided weakness  . Hypercholesterolemia    no current med.  . Hypertension    states under control with meds., has been on med. since 06/2011  . Keloid 03/2015   umbilical  . Migraines   . Osteoarthritis   . Paget disease of bone   . PONV (postoperative nausea   and vomiting)    states severe  . Seasonal allergies   . Stroke (HCC) 10/27/2012   TIA   1- 10/29/11-weakness left side    SURGICAL HISTORY: Past Surgical History:  Procedure Laterality Date  . CARPAL TUNNEL RELEASE Left 12/2013  . CARPAL TUNNEL RELEASE Right 03/29/2015  . CHOLECYSTECTOMY  08/27/2009  . DIAGNOSTIC LAPAROSCOPY  early 2005  . ETHMOIDECTOMY Bilateral 04/30/2006  . FUNCTIONAL ENDOSCOPIC SINUS SURGERY  04/30/2006  . HYSTEROSCOPY  W/D&C  04/21/2002   with lysis of adhesions  . KNEE ARTHROSCOPY Left 02/2000  . LAMINOTOMY Left 03/27/2010   L5-S1; microdissection  . LAMINOTOMY Bilateral 07/12/2007   L4-5  . LUMBAR FUSION Left 07/18/2010   L4-5; L5-S1  . LUMBAR FUSION  04/10/2014   L3-4, L4-5, L5-S1  . NASAL TURBINATE REDUCTION  04/30/2006   inferior  . TOTAL ABDOMINAL HYSTERECTOMY  11/01/2003   with lysis of adhesions; ovaries retained  . TOTAL KNEE ARTHROPLASTY Left 12/31/2014   Procedure: TOTAL LEFT KNEE ARTHROPLASTY;  Surgeon: Frank Rowan, MD;  Location: [facility] OR;  Service: Orthopedics;  Laterality: Left;  . TOTAL KNEE ARTHROPLASTY Right 11/11/2015   Procedure: TOTAL KNEE ARTHROPLASTY;  Surgeon: Frank Rowan, MD;  Location: [facility] OR;  Service: Orthopedics;  Laterality: Right;  . VAGINAL HYSTERECTOMY     for DUB in 10/2003; partial    SOCIAL HISTORY: Social History   Social History  . Marital status: Divorced    Spouse name: N/A  . Number of children: 2  . Years of education: N/A   Occupational History  . health advisor Unemployed    [employer]   Social History Main Topics  . Smoking status: Former Smoker    Packs/day: 0.00    Quit date: 07/20/2004  . Smokeless tobacco: Never Used  . Alcohol use No  . Drug use: No  . Sexual activity: Yes    Partners: Male   Other Topics Concern  . Not on file   Social History Narrative   Lives in [place] with daughter      Gabriele (c) [phone]   nigel       (c) [phone]    FAMILY HISTORY: Family History  Problem Relation Age of Onset  . Anesthesia problems Mother     post-op N/V  . Anxiety disorder Father   . Depression Father   . Cervical cancer Maternal Grandmother   . Cancer Maternal Grandmother     mouth  . Heart attack Maternal Grandmother 55  . Hypertension Brother   . Heart failure Brother     ICD    ALLERGIES:  is allergic to lisinopril; latex; and oxycodone-acetaminophen.  MEDICATIONS:  Current Outpatient Prescriptions  Medication Sig  Dispense Refill  . albuterol (PROVENTIL HFA;VENTOLIN HFA) 108 (90 BASE) MCG/ACT inhaler Inhale 1-2 puffs into the lungs every 6 (six) hours as needed for wheezing or shortness of breath.     . albuterol (PROVENTIL) (5 MG/ML) 0.5% nebulizer solution Take 2.5 mg by nebulization every 6 (six) hours as needed for wheezing or shortness of breath.    . aspirin EC 325 MG tablet Take 1 tablet (325 mg total) by mouth 2 (two) times daily. (Patient taking differently: Take 325 mg by mouth daily. ) 30 tablet 0  . atenolol (TENORMIN) 50 MG tablet Take 1 tablet (50 mg total) by mouth daily. 30 tablet 0  . busPIRone (BUSPAR) 15 MG tablet Take 15 mg by mouth 2 (two) times daily.     . cetirizine (ZYRTEC) 10   MG chewable tablet Chew 10 mg by mouth at bedtime.     . ferrous sulfate 325 (65 FE) MG tablet Take 325 mg by mouth 2 (two) times daily with a meal.    . Fluticasone-Salmeterol (ADVAIR) 250-50 MCG/DOSE AEPB Inhale 1 puff into the lungs 2 (two) times daily.    . gabapentin (NEURONTIN) 300 MG capsule TAKE 2 CAPSULES(600 MG) BY MOUTH TWICE DAILY 120 capsule 0  . gabapentin (NEURONTIN) 300 MG capsule Take 2 capsules (600 mg total) by mouth 2 (two) times daily. 120 capsule 3  . hydrochlorothiazide (MICROZIDE) 12.5 MG capsule Take 12.5 mg by mouth daily.    . HYDROcodone-acetaminophen (NORCO) 7.5-325 MG tablet Take 1 tablet by mouth every 6 (six) hours as needed for moderate pain.     . mometasone (NASONEX) 50 MCG/ACT nasal spray Place 2 sprays into the nose daily.    . montelukast (SINGULAIR) 10 MG tablet Take 10 mg by mouth at bedtime.    . ondansetron (ZOFRAN) 8 MG tablet Take 1 tablet (8 mg total) by mouth every 8 (eight) hours as needed for nausea or vomiting. 20 tablet 0  . pantoprazole (PROTONIX) 40 MG tablet Take 40 mg by mouth 2 (two) times daily before a meal.     . Probiotic Product (ALIGN PO) Take by mouth.    . rosuvastatin (CRESTOR) 10 MG tablet Take 10 mg by mouth daily.    . sertraline (ZOLOFT) 100  MG tablet Take 100-200 mg by mouth 2 (two) times daily. Take 100 mg in the am and 200 mg in the pm    . tiZANidine (ZANAFLEX) 4 MG tablet Take 1 tablet (4 mg total) by mouth at bedtime as needed for muscle spasms. 90 tablet 0  . Topiramate ER (TROKENDI XR) 50 MG CP24 Take 1 capsule by mouth at bedtime. 30 capsule 0  . Vitamin D, Ergocalciferol, (DRISDOL) 50000 UNITS CAPS Take 50,000 Units by mouth every 7 (seven) days. mondays    . amoxicillin (AMOXIL) 875 MG tablet Take 1 tablet (875 mg total) by mouth 2 (two) times daily. 14 tablet 0  . brompheniramine-pseudoephedrine-DM 30-2-10 MG/5ML syrup Take 5 mLs by mouth 4 (four) times daily as needed. 120 mL 0  . methylPREDNISolone (MEDROL DOSEPAK) 4 MG TBPK tablet Take 6-5-4-3-2-1 po qd 21 tablet 0   No current facility-administered medications for this visit.     REVIEW OF SYSTEMS:    10 Point review of Systems was done is negative except as noted above.  PHYSICAL EXAMINATION: ECOG PERFORMANCE STATUS: 2 - Symptomatic, <50% confined to bed  . Vitals:   09/07/16 0953  BP: (!) 123/46  Pulse: 96  Resp: 18  Temp: 98.6 F (37 C)   Filed Weights   09/07/16 0953  Weight: 250 lb 6.4 oz (113.6 kg)   .Body mass index is 40.42 kg/m.  GENERAL:alert, in no acute distress and comfortable SKIN: skin color, texture, turgor are normal, no rashes or significant lesions EYES: normal, conjunctiva are pink and non-injected, sclera clear OROPHARYNX:no exudate, no erythema and lips, buccal mucosa, and tongue normal  NECK: supple, no JVD, thyroid normal size, non-tender, without nodularity LYMPH:  no palpable lymphadenopathy in the cervical, axillary or inguinal LUNGS: clear to auscultation with normal respiratory effort, TTP over the rt posterior lower chest wall. HEART: regular rate & rhythm,  no murmurs and no lower extremity edema ABDOMEN: abdomen soft, non-tender, normoactive bowel sounds  Musculoskeletal: no cyanosis of digits and no clubbing    PSYCH:   alert & oriented x 3 with fluent speech NEURO: no focal motor/sensory deficits  LABORATORY DATA:  I have reviewed the data as listed  . CBC Latest Ref Rng & Units 09/07/2016 05/09/2016 05/04/2016  WBC 3.9 - 10.3 10e3/uL 7.2 6.8 7.3  Hemoglobin 11.6 - 15.9 g/dL 12.8 12.6 13.2  Hematocrit 34.8 - 46.6 % 38.9 39.2 41.1  Platelets 145 - 400 10e3/uL 181 161 176   . CBC    Component Value Date/Time   WBC 7.2 09/07/2016 0929   WBC 6.8 05/09/2016 1400   RBC 4.49 09/07/2016 0929   RBC 4.46 05/09/2016 1400   HGB 12.8 09/07/2016 0929   HCT 38.9 09/07/2016 0929   PLT 181 09/07/2016 0929   MCV 86.6 09/07/2016 0929   MCH 28.5 09/07/2016 0929   MCH 28.3 05/09/2016 1400   MCHC 32.9 09/07/2016 0929   MCHC 32.1 05/09/2016 1400   RDW 13.9 09/07/2016 0929   LYMPHSABS 1.9 09/07/2016 0929   MONOABS 0.4 09/07/2016 0929   EOSABS 0.2 09/07/2016 0929   BASOSABS 0.0 09/07/2016 0929     . CMP Latest Ref Rng & Units 09/07/2016 05/09/2016 05/04/2016  Glucose 70 - 140 mg/dl 123 106(H) 109  BUN 7.0 - 26.0 mg/dL 14.4 10 11.5  Creatinine 0.6 - 1.1 mg/dL 0.8 0.67 0.7  Sodium 136 - 145 mEq/L 143 143 145  Potassium 3.5 - 5.1 mEq/L 3.5 3.4(L) 3.7  Chloride 101 - 111 mmol/L - 106 -  CO2 22 - 29 mEq/L _0 Calcium 8.4 - 10.4 mg/dL 9.9 9.4 9.7  Total Protein 6.4 - 8.3 g/dL 7.5 - 7.3  Total Bilirubin 0.20 - 1.20 mg/dL 0.35 - 0.32  Alkaline Phos 40 - 150 U/L 129 - 158(H)  AST 5 - 34 U/L 24 - 22  ALT 0 - 55 U/L 28 - 20    RADIOGRAPHIC STUDIES: I have personally reviewed the radiological images as listed and agreed with the findings in the report. Ct Abdomen Pelvis W Contrast  Result Date: 09/02/2016 CLINICAL DATA:  Followup right iliac lymphadenopathy. Abdominal pain and diarrhea. EXAM: CT ABDOMEN AND PELVIS WITH CONTRAST TECHNIQUE: Multidetector CT imaging of the abdomen and pelvis was performed using the standard protocol following bolus administration of intravenous contrast. CONTRAST:   151m ISOVUE-300 IOPAMIDOL (ISOVUE-300) INJECTION 61% COMPARISON:  02/12/2016 FINDINGS: Lower Chest: No acute findings. Hepatobiliary: No masses identified. Prior cholecystectomy noted. No evidence of biliary dilatation. Pancreas:  No mass or inflammatory changes. Spleen: Within normal limits in size and appearance. Adrenals/Urinary Tract: No masses identified. No evidence of hydronephrosis. Stomach/Bowel: No evidence of obstruction, inflammatory process or abnormal fluid collections. Mild sigmoid diverticulosis. No evidence of diverticulitis. Normal appendix visualized. Vascular/Lymphatic: No pathologically enlarged lymph nodes. 9 mm right external iliac lymph node on image 70/2 has decreased in size from 14 mm on previous study. Other sub-cm bilateral external iliac lymph nodes are stable. No abdominal aortic aneurysm. Aortic atherosclerosis. Reproductive: Prior hysterectomy noted. Adnexal regions are unremarkable in appearance. Other:  None. Musculoskeletal: No suspicious bone lesions identified. Lower lumbar spine PLIF again noted. IMPRESSION: Mild sigmoid colonic diverticulosis. No radiographic evidence of diverticulitis or other acute findings. Decreased size of right external iliac lymph node, consistent with benign etiology. No pathologically enlarged lymph nodes identified. Electronically Signed   By: JEarle GellM.D.   On: 09/02/2016 11:54    ASSESSMENT & PLAN:   51yo with   1) Rt chest wall pain with CTA chest showing Cortical thickening of the  right posterior seventh rib which appears to be new. In the setting of elevated alkaline phosphatase levels this could certain represent pagets disease. 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  CT abdomen pelvis showed no overt evidence of metastatic disease. Small 1.4 cm right pelvic lymph node appears to be nonspecifically  enlarged but will need to be followed up.  No clinical or imaging evidence of a primary malignancy at this time.  Patient treated with IV Zometa for paget's disease with normalization of ALKPO4 levels.  2) ?Zometa related drug fevers  Plan -would recommend primary care physician to consider an MRI of the T-spine to rule out thoracic radiculopathy that could be causing her right-sided chest pain. -would recommend rheumatology evaluation for w/u fo arthralgias/mylagias. -no additional Zometa given significant drug fevers No strong indications for treatment of Paget's disease except if obvious indications including symptomatic Paget's disease or asymptomatic Paget's disease with serum alkaline phosphatase 2-4 times ULN, planned surgery at active pagetic site, hypercalcemia associated with this, pagetic disease at the site where complications could occur. -She will follow-up with her primary care physician to consider further imaging of her T-spine to rule out other pathology for her chest wall pain. -MMG 01/27/2016 negative. -rpt CT abd/pelvis with resolution of plevic LN consistent with benign etiology.  2) h/o Iron deficiency anemia - ferritin currently 134 and hgb near normal at 11.8 with normal MCV. Element of blood loss anemia related to her knee surgery in April 2017 and last year. Patient has been on FESO4 1 tab po BID which has corrected most of her anemia. Plan -she reports that she was recommended a capsule endoscopy and should f/u with GI recommendations. -might reduce FESO4 to 1 tab po daily and continue till iron sat close to 30% . -reasonable to take a daily multivitamin.  Continue f/u with PCP RTC with Dr Irene Limbo if any new questions or concerns arise. . No orders of the defined types were placed in this encounter.    All of the patients questions were answered with apparent satisfaction. The patient knows to call the clinic with any problems, questions or concerns.  I spent 20  minutes counseling the patient face to face. The total time spent in the appointment was 25 minutes and more than 50% was on counseling and direct patient cares.    Sullivan Lone MD MS AAHIVMS [facility] Hematology/Oncology Physician [place]  09/12/2016 11:22 PM

## 2016-09-16 ENCOUNTER — Other Ambulatory Visit: Payer: Self-pay | Admitting: *Deleted

## 2016-09-16 ENCOUNTER — Telehealth: Payer: Self-pay | Admitting: Neurology

## 2016-09-16 MED ORDER — ONDANSETRON HCL 8 MG PO TABS: 8.0000 mg | ORAL_TABLET | Freq: Three times a day (TID) | ORAL | 0 refills | Status: DC | PRN

## 2016-09-16 MED ORDER — TIZANIDINE HCL 4 MG PO TABS: 4.0000 mg | ORAL_TABLET | Freq: Every evening | ORAL | 0 refills | Status: DC | PRN

## 2016-09-16 NOTE — Telephone Encounter (Signed)
Patient needs her rx called in to the [facility] to [place] for the ondansetron and the tizandinie and also she wants to know the status of the approval on the trokendi  Please call her at [phone]

## 2016-09-16 NOTE — Telephone Encounter (Signed)
Prescriptions sent in.  Don't know the status of the trokendi.

## 2016-09-17 MED ORDER — ONDANSETRON HCL 8 MG PO TABS: 8.0000 mg | ORAL_TABLET | Freq: Three times a day (TID) | ORAL | 0 refills | Status: DC | PRN

## 2016-09-17 MED ORDER — TIZANIDINE HCL 4 MG PO TABS: 4.0000 mg | ORAL_TABLET | Freq: Every evening | ORAL | 0 refills | Status: DC | PRN

## 2016-09-17 NOTE — Telephone Encounter (Signed)
Spoke to [facility] about Trokendi PA status.  Provided addt'l info again as requested. [id number] under review decision to be made in 24hrs per [facility] rep Marley @[phone] .

## 2016-09-29 ENCOUNTER — Telehealth: Payer: Self-pay | Admitting: Neurology

## 2016-09-29 ENCOUNTER — Other Ambulatory Visit: Payer: Self-pay | Admitting: Neurology

## 2016-09-29 NOTE — Telephone Encounter (Signed)
Attempted to call to find out about appeal. Appeals dept stated that the appeal had been cancelled due to the fact that "the appeals dept doesn't handle that problem" line was then disconnected by [facility] after a 27 minute phone call. Unsure of what this means. Will attempt to call back again later.

## 2016-09-29 NOTE — Telephone Encounter (Signed)
Called to let pt know that I had been working to find an answer about the appeals but did have some difficult. Pt understood. Advised that I would attempt again after last pt of the day.

## 2016-09-29 NOTE — Telephone Encounter (Signed)
Pt is calling to check on the appeal for Trokendi XR.  Have we heard any thing?  Please call.

## 2016-09-29 NOTE — Telephone Encounter (Signed)
  [id number]

## 2016-10-15 NOTE — Telephone Encounter (Signed)
Patient called about her trokendi xr she is almost and has not heard anything from [facility] and wants to know the status please call [phone]

## 2016-10-16 NOTE — Telephone Encounter (Signed)
Called pt to inform called [facility] to check PA status Was on the phone for 2 hours. [facility] supervisor resoloution: resubmit new PA since after over 60 days. Not sure what happen to old appeal but no decision made. Will fax info to office to fill out and send clinicals. Supervisor Cory Munch [facility]: [phone] [place] PA dept: [phone] New HN#[phone]

## 2016-10-21 ENCOUNTER — Ambulatory Visit (INDEPENDENT_AMBULATORY_CARE_PROVIDER_SITE_OTHER): Payer: Medicare Other | Admitting: Neurology

## 2016-10-21 ENCOUNTER — Encounter: Payer: Self-pay | Admitting: Neurology

## 2016-10-21 ENCOUNTER — Other Ambulatory Visit: Payer: Self-pay | Admitting: Neurology

## 2016-10-21 ENCOUNTER — Telehealth: Payer: Self-pay | Admitting: Neurology

## 2016-10-21 VITALS — BP 126/72 | HR 82 | Ht 66.0 in | Wt 247.1 lb

## 2016-10-21 DIAGNOSIS — G43409 Hemiplegic migraine, not intractable, without status migrainosus: Secondary | ICD-10-CM | POA: Diagnosis not present

## 2016-10-21 MED ORDER — CHLORPROMAZINE HCL 25 MG PO TABS: 25.0000 mg | ORAL_TABLET | Freq: Three times a day (TID) | ORAL | 0 refills | Status: DC | PRN

## 2016-10-21 MED ORDER — TOPIRAMATE ER 50 MG PO CAP24: 1.0000 | ORAL_CAPSULE | Freq: Every day | ORAL | 0 refills | Status: DC

## 2016-10-21 NOTE — Addendum Note (Signed)
Addended by: Milta Deiters on: 10/21/2016 11:38 AM   Modules accepted: Orders

## 2016-10-21 NOTE — Telephone Encounter (Signed)
[facility] prior Auth is Approved for Trokendi XR 50 MG. She would like you to please call her. Thank you

## 2016-10-21 NOTE — Patient Instructions (Signed)
1.  We will increase Trokendi XR to 100mg  daily 2.  I will check with Dr. Kalman Shan and Chapman Moss regarding use of Reglan or antipsychotic for acute migraine attackes. 3.  Follow up in 3 months.

## 2016-10-21 NOTE — Progress Notes (Signed)
NEUROLOGY FOLLOW UP OFFICE NOTE  Wanda [name] [id number]  HISTORY OF PRESENT ILLNESS: Wanda [name] is a 51 year old right-handed woman with Paget's disease, GERD, IBSD, hyperlipidemia, carotid artery disease and bilateral knee replacements who follows up for chronic migraine and hemiplegic migraine.   UPDATE: She has been taking Trokendi but it has been difficult getting it approved by her insurance.  She reports aura with left facial numbness as well as photophobia, is more intense.  Still has left sided weakness. Intensity:  6-10/10 Duration:  Up to 2 days.  Takes tizanidine Frequency:  1 to 2 days a week. Current NSAIDS:  .  no Current analgesics:  Hydrocodone (for pain related to Paget's) Current triptans:  no Current anti-emetic:  Zofran 8mg  Current muscle relaxants:  Tizanidine 4mg  (helps neck pain but not headache) Current Antihypertensive medications:  atenolol 50mg  Current Antidepressant medications:  sertraline 100mg  Current Anxiolytic:  BuSpar Current Anticonvulsant medications:  Trokendi XR 50mg  Current Vitamins/Herbal/Supplements:  ferrous sulfate Current Antihistamines/Decongestants:  no Other therapy:  No   Caffeine:  1 cup coffee daily Alcohol:  no Smoker:  no Diet:  Needs to keep hydrated more Exercise:  No because of knees.  Uses cane. Depression/stress:  okay Sleep: fair   Carotid doppler from 06/26/16 showed minor carotid atherosclerosis but no hemodynamically significant stenosis. BMP from 05/09/16 was unremarkable.   HISTORY:  On 10/28/11, she was hospitalized for severe left sided headache associated with slurred speech, left facial droop and left sided arm and leg weakness and numbness.  Headache lasted a day and weakness lasted a few days but she reports mild residual weakness.  MRI and MRA of head were personally reviewed and were negative for acute stroke.  Carotid doppler showed 40-59% bilateral ICA stenosis with antegrade flow in the  vertebral arteries.  Echo showed EF 60% with no PFO or ASD.  She was diagnosed with TIA and started on ASA.  She has subsequently had several other similar episodes.  CT head and MRI brain from 06/02/12, CT head and MRI of brain from 10/28/12, CT head and MRI brain from 06/02/15, and MRI brain from 10/03/15 were all personally reviewed and were unremarkable.  MRI/MRA/MRV of head from 02/28/13 were unremarkable.   She has left sided headache without the weakness as well.  It is severe and associated with nausea and photophobia.  They last 1 to 2 days. They occur 2 to 3 days per week.  She has 25 headache days per month. Triggers/exacerbating factors:  Dairy, cigarette smoke Relieving factors:  rest Activity:  Cannot function with severe headache.   Past NSAIDS:  Naproxen (GI upset), ibuprofen, Cambia (GI upset) Past analgesics:  Excedrin, Tylenol, Percocet (hives), Norco, tramadol (hot flashes) Past abortive triptans:  Relpax Past muscle relaxants: no Past anti-emetic:  Phenergan 25mg  Past antihypertensive medications:  verapamil 24hr 180mg , HCTZ, lisinopril Past antidepressant medications:  nortriptyline 50mg  Past anticonvulsant medications:  Depakote, zonisamide, topiramate 100mg  twice daily (effective but caused memory problems and paresthesias), gabapentin 300mg  three times daily Past vitamins/Herbal/Supplements:  no Other past therapy:  Botox (2 rounds)   Family history of headache:  mother  PAST MEDICAL HISTORY: Past Medical History:  Diagnosis Date  . Acne   . Anemia    takes iron supplement  . Anxiety   . Asthma   . Chronic bronchitis ([place])   . Depression   . Family history of adverse reaction to anesthesia    pt's mother has hx. of post-op N/V  .  GERD (gastroesophageal reflux disease)   . History of blood transfusion   . History of TIA (transient ischemic attack)    x 2; left-sided weakness  . Hypercholesterolemia    no current med.  . Hypertension    states under control with  meds., has been on med. since 06/2011  . Keloid 02/6760   umbilical  . Migraines   . Osteoarthritis   . Paget disease of bone   . PONV (postoperative nausea and vomiting)    states severe  . Seasonal allergies   . Stroke ([place]) 10/27/2012   TIA   1- 10/29/11-weakness left side    MEDICATIONS: Current Outpatient Prescriptions on File Prior to Visit  Medication Sig Dispense Refill  . albuterol (PROVENTIL HFA;VENTOLIN HFA) 108 (90 BASE) MCG/ACT inhaler Inhale 1-2 puffs into the lungs every 6 (six) hours as needed for wheezing or shortness of breath.     Marland Kitchen albuterol (PROVENTIL) (5 MG/ML) 0.5% nebulizer solution Take 2.5 mg by nebulization every 6 (six) hours as needed for wheezing or shortness of breath.    Marland Kitchen aspirin EC 325 MG tablet Take 1 tablet (325 mg total) by mouth 2 (two) times daily. (Patient taking differently: Take 325 mg by mouth daily. ) 30 tablet 0  . atenolol (TENORMIN) 50 MG tablet Take 1 tablet (50 mg total) by mouth daily. 30 tablet 0  . busPIRone (BUSPAR) 15 MG tablet Take 15 mg by mouth 2 (two) times daily.     . cetirizine (ZYRTEC) 10 MG chewable tablet Chew 10 mg by mouth at bedtime.     . ferrous sulfate 325 (65 FE) MG tablet Take 325 mg by mouth 2 (two) times daily with a meal.    . Fluticasone-Salmeterol (ADVAIR) 250-50 MCG/DOSE AEPB Inhale 1 puff into the lungs 2 (two) times daily.    . hydrochlorothiazide (MICROZIDE) 12.5 MG capsule Take 12.5 mg by mouth daily.    Marland Kitchen HYDROcodone-acetaminophen (NORCO) 7.5-325 MG tablet Take 1 tablet by mouth every 6 (six) hours as needed for moderate pain.     . mometasone (NASONEX) 50 MCG/ACT nasal spray Place 2 sprays into the nose daily.    . montelukast (SINGULAIR) 10 MG tablet Take 10 mg by mouth at bedtime.    . ondansetron (ZOFRAN) 8 MG tablet Take 1 tablet (8 mg total) by mouth every 8 (eight) hours as needed for nausea or vomiting. 20 tablet 0  . pantoprazole (PROTONIX) 40 MG tablet Take 40 mg by mouth 2 (two) times daily before a  meal.     . Probiotic Product (ALIGN PO) Take by mouth.    . rosuvastatin (CRESTOR) 10 MG tablet Take 10 mg by mouth daily.    . sertraline (ZOLOFT) 100 MG tablet Take 100-200 mg by mouth 2 (two) times daily. Take 100 mg in the am and 200 mg in the pm    . tiZANidine (ZANAFLEX) 4 MG tablet Take 1 tablet (4 mg total) by mouth at bedtime as needed for muscle spasms. 90 tablet 0  . Vitamin D, Ergocalciferol, (DRISDOL) 50000 UNITS CAPS Take 50,000 Units by mouth every 7 (seven) days. mondays    . gabapentin (NEURONTIN) 300 MG capsule TAKE 2 CAPSULES(600 MG) BY MOUTH TWICE DAILY (Patient not taking: Reported on 10/21/2016) 120 capsule 0  . gabapentin (NEURONTIN) 300 MG capsule Take 2 capsules (600 mg total) by mouth 2 (two) times daily. (Patient not taking: Reported on 10/21/2016) 120 capsule 3   No current facility-administered medications on file prior  to visit.     ALLERGIES: Allergies  Allergen Reactions  . Lisinopril Swelling and Other (See Comments)    SWELLING FACE/LIPS/NECK  . Latex Rash  . Oxycodone-Acetaminophen Itching and Rash    Also percocet    FAMILY HISTORY: Family History  Problem Relation Age of Onset  . Anesthesia problems Mother     post-op N/V  . Anxiety disorder Father   . Depression Father   . Cervical cancer Maternal Grandmother   . Cancer Maternal Grandmother     mouth  . Heart attack Maternal Grandmother 55  . Hypertension Brother   . Heart failure Brother     ICD    SOCIAL HISTORY: Social History   Social History  . Marital status: Divorced    Spouse name: N/A  . Number of children: 2  . Years of education: N/A   Occupational History  . health advisor Unemployed    [employer]   Social History Main Topics  . Smoking status: Former Smoker    Packs/day: 0.00    Quit date: 07/20/2004  . Smokeless tobacco: Never Used  . Alcohol use No  . Drug use: No  . Sexual activity: Yes    Partners: Male   Other Topics Concern  . Not on file    Social History Narrative   Lives in [place] with daughter      Servando Snare (c) [phone]   nigel       (c) [phone]    REVIEW OF SYSTEMS: Constitutional: No fevers, chills, or sweats, no generalized fatigue, change in appetite Eyes: No visual changes, double vision, eye pain Ear, nose and throat: No hearing loss, ear pain, nasal congestion, sore throat Cardiovascular: No chest pain, palpitations Respiratory:  No shortness of breath at rest or with exertion, wheezes GastrointestinaI: diarrhea, stomach upset Genitourinary:  No dysuria, urinary retention or frequency Musculoskeletal:  Joint pain Integumentary: No rash, pruritus, skin lesions Neurological: as above Psychiatric: No depression, insomnia, anxiety Endocrine: No palpitations, fatigue, diaphoresis, mood swings, change in appetite, change in weight, increased thirst Hematologic/Lymphatic:  No purpura, petechiae. Allergic/Immunologic: no itchy/runny eyes, nasal congestion, recent allergic reactions, rashes  PHYSICAL EXAM: Vitals:   10/21/16 0939  BP: 126/72  Pulse: 82   General: No acute distress.  Patient appears well-groomed.  Head:  Normocephalic/atraumatic Eyes:  Fundi examined but not visualized Neck: supple, no paraspinal tenderness, full range of motion Heart:  Regular rate and rhythm Lungs:  Clear to auscultation bilaterally Back: No paraspinal tenderness Neurological Exam: alert and oriented to person, place, and time. Attention span and concentration intact, recent and remote memory intact, fund of knowledge intact.  Speech fluent and not dysarthric, language intact.  CN II-XII intact. Bulk and tone normal, muscle strength 5/5 throughout.  Sensation to light touch  intact.  Deep tendon reflexes 2+ throughout.  Finger to nose testing intact.  Gait normal  IMPRESSION: Hemiplegic migraines  PLAN: 1.  Increase Trokendi XR to 100mg  daily 2.  Abortive therapy is limited as she cannot take triptans, NSAIDs, tramadol  and Tylenol is ineffective.  Other options include Thorazine, Compazine, thithixene or Reglan.  With Reglan, we would have to monitor for extrapyramidal side effects.  I got authorization from [place] to speak with Janeth Rase, ANP, her psychiatric provider about these medication options.  She said it would be okay to use these options (monitoring for extrapyramidal symptoms with Reglan).  I will start her on Thorazine 25mg  to take every 8 hours as needed for acute  migraine headache. 3.  Follow up in 3 months.  Metta Clines, DO  CC:  Aura Dials, MD  Janeth Rase, ANP

## 2016-10-22 MED ORDER — TOPIRAMATE ER 100 MG PO CAP24: 1.0000 | ORAL_CAPSULE | Freq: Every day | ORAL | 0 refills | Status: DC

## 2016-10-22 NOTE — Telephone Encounter (Signed)
Spoke to patient advised submitted PA for Trokendi increase from 50mg  to 100mg  after yesterday's OV. Trokendi 50mg  was finally approved yesterday also. [facility] will review increase. PA result will be faxed to our office in 24-48 hrs. New FM#[id number]. Will check status of PA Monday. Pt agreed and verbalized understanding.

## 2016-10-27 MED ORDER — TOPIRAMATE ER 100 MG PO CAP24: 1.0000 | ORAL_CAPSULE | Freq: Every day | ORAL | 0 refills | Status: DC

## 2016-10-27 NOTE — Telephone Encounter (Signed)
Recvd fax PA for Trokendi 100mg  approved.  Sent Rx to [facility].

## 2016-10-27 NOTE — Addendum Note (Signed)
Addended by: Milta Deiters on: 10/27/2016 02:00 PM   Modules accepted: Orders

## 2016-10-29 ENCOUNTER — Telehealth: Payer: Self-pay | Admitting: Neurology

## 2016-10-29 MED ORDER — TOPIRAMATE ER 100 MG PO CAP24: 1.0000 | ORAL_CAPSULE | Freq: Every day | ORAL | 1 refills | Status: DC

## 2016-10-29 NOTE — Telephone Encounter (Signed)
Spoke to pt. Advised spoke w/ [facility].  Trokendi 100mg  was approved.  Pt requested Rx sent to [facility].

## 2016-10-29 NOTE — Telephone Encounter (Signed)
Pt would like a status update on her Trokendi XR 100mg  rx approval  and if you have samples.

## 2016-12-10 ENCOUNTER — Telehealth: Payer: Self-pay

## 2016-12-10 MED ORDER — TIZANIDINE HCL 4 MG PO TABS: 4.0000 mg | ORAL_TABLET | Freq: Every evening | ORAL | 1 refills | Status: DC | PRN

## 2016-12-10 NOTE — Telephone Encounter (Signed)
Refilled Tizanidine

## 2016-12-13 ENCOUNTER — Emergency Department (HOSPITAL_COMMUNITY): Payer: Medicare Other

## 2016-12-13 ENCOUNTER — Emergency Department (HOSPITAL_COMMUNITY)
Admission: EM | Admit: 2016-12-13 | Discharge: 2016-12-13 | Disposition: A | Discharge status: Home / Self Care | Payer: Medicare Other | Attending: Emergency Medicine | Admitting: Emergency Medicine

## 2016-12-13 ENCOUNTER — Encounter (HOSPITAL_COMMUNITY): Payer: Self-pay | Admitting: Emergency Medicine

## 2016-12-13 DIAGNOSIS — Z96653 Presence of artificial knee joint, bilateral: Secondary | ICD-10-CM | POA: Diagnosis not present

## 2016-12-13 DIAGNOSIS — I10 Essential (primary) hypertension: Secondary | ICD-10-CM | POA: Diagnosis not present

## 2016-12-13 DIAGNOSIS — J45909 Unspecified asthma, uncomplicated: Secondary | ICD-10-CM | POA: Diagnosis not present

## 2016-12-13 DIAGNOSIS — Z8673 Personal history of transient ischemic attack (TIA), and cerebral infarction without residual deficits: Secondary | ICD-10-CM | POA: Diagnosis not present

## 2016-12-13 DIAGNOSIS — Z91040 Latex allergy status: Secondary | ICD-10-CM | POA: Diagnosis not present

## 2016-12-13 DIAGNOSIS — Z87891 Personal history of nicotine dependence: Secondary | ICD-10-CM | POA: Insufficient documentation

## 2016-12-13 DIAGNOSIS — Z7982 Long term (current) use of aspirin: Secondary | ICD-10-CM | POA: Insufficient documentation

## 2016-12-13 DIAGNOSIS — M546 Pain in thoracic spine: Secondary | ICD-10-CM | POA: Diagnosis not present

## 2016-12-13 DIAGNOSIS — M542 Cervicalgia: Secondary | ICD-10-CM

## 2016-12-13 DIAGNOSIS — Z79899 Other long term (current) drug therapy: Secondary | ICD-10-CM | POA: Insufficient documentation

## 2016-12-13 DIAGNOSIS — M549 Dorsalgia, unspecified: Secondary | ICD-10-CM

## 2016-12-13 MED ORDER — TIZANIDINE HCL 4 MG PO CAPS: 4.0000 mg | ORAL_CAPSULE | Freq: Three times a day (TID) | ORAL | 0 refills | Status: DC

## 2016-12-13 MED ORDER — HYDROCODONE-ACETAMINOPHEN 5-325 MG PO TABS: ORAL_TABLET | ORAL | 0 refills | Status: DC

## 2016-12-13 MED ORDER — TIZANIDINE HCL 4 MG PO TABS
4.0000 mg | ORAL_TABLET | Freq: Once | ORAL | Status: AC
  Administered 2016-12-13: 4 mg via ORAL
  Filled 2016-12-13: qty 1

## 2016-12-13 MED ORDER — KETOROLAC TROMETHAMINE 15 MG/ML IJ SOLN
15.0000 mg | Freq: Once | INTRAMUSCULAR | Status: AC
  Administered 2016-12-13: 15 mg via INTRAVENOUS
  Filled 2016-12-13: qty 1

## 2016-12-13 MED ORDER — DIPHENHYDRAMINE HCL 50 MG/ML IJ SOLN
25.0000 mg | Freq: Once | INTRAMUSCULAR | Status: AC
  Administered 2016-12-13: 25 mg via INTRAVENOUS
  Filled 2016-12-13: qty 1

## 2016-12-13 MED ORDER — HYDROCODONE-ACETAMINOPHEN 5-325 MG PO TABS
2.0000 | ORAL_TABLET | Freq: Once | ORAL | Status: AC
  Administered 2016-12-13: 2 via ORAL
  Filled 2016-12-13: qty 2

## 2016-12-13 MED ORDER — METOCLOPRAMIDE HCL 5 MG/ML IJ SOLN
10.0000 mg | Freq: Once | INTRAMUSCULAR | Status: AC
Start: 2016-12-13 — End: 2016-12-13
  Administered 2016-12-13: 10 mg via INTRAVENOUS
  Filled 2016-12-13: qty 2

## 2016-12-13 NOTE — ED Provider Notes (Signed)
[facility] DEPT Provider Note   CSN: [id number] Arrival date & time: 12/13/16  2952     History   Chief Complaint Chief Complaint  Patient presents with  . Neck Pain  . Back Pain    HPI Wanda [name] is a 51 y.o. female.  Patient with history of hemiplegic migraine, chronic neck pain, Paget disease of the spine -- presents with one week of right-sided neck and posterior shoulder pain without injury. Patient states that the pain started as a mild muscle pain. She applied topical diclofenac and Tylenol. It has gradually progressed. This morning pain was severe prompting emergency department visit. She states that movement of her right arm, turning of her head, causes pain in her shoulder and neck. No vision change, weakness in arms or legs, difficulty walking. She states that opening and closing her mouth like the pain worse as well, however she has not had any difficulty with swallowing or sore throat. No fevers. Patient has been taking Zanaflex as migraine maintenance at bedtime.      Past Medical History:  Diagnosis Date  . Acne   . Anemia    takes iron supplement  . Anxiety   . Asthma   . Chronic bronchitis ([place])   . Depression   . Family history of adverse reaction to anesthesia    pt's mother has hx. of post-op N/V  . GERD (gastroesophageal reflux disease)   . History of blood transfusion   . History of TIA (transient ischemic attack)    x 2; left-sided weakness  . Hypercholesterolemia    no current med.  . Hypertension    states under control with meds., has been on med. since 06/2011  . Keloid 02/4131   umbilical  . Migraines   . Osteoarthritis   . Paget disease of bone   . PONV (postoperative nausea and vomiting)    states severe  . Seasonal allergies   . Stroke ([place]) 10/27/2012   TIA   1- 10/29/11-weakness left side    Patient Active Problem List   Diagnosis Date Noted  . Paget disease of bone 05/04/2016  . Bone lesion 01/22/2016  .  Osteoarthritis of right knee 11/11/2015  . Primary osteoarthritis of right knee 11/10/2015  . Arthritis of knee 12/31/2014  . Primary osteoarthritis of left knee 10/12/2014  . Lumbar spinal stenosis 04/10/2014  . 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 Cervical disc disorder with radiculopathy of cervical region 05/03/2013  . Weakness of left side of body 10/28/2012  . Complicated migraine 44/07/270  . Disturbance of skin sensation 06/02/2012  . CVA (cerebral infarction)? vs Atypical Migraine 10/28/2011  . Sinusitis 04/10/2011  . Borderline blood pressure 04/10/2011  . CHRONIC RHINITIS 04/28/2010  . Lumbar L5 diskektomy 02/17/2010  . GERD 08/20/2009  . ABDOMINAL PAIN, UNSPECIFIED 08/20/2009  . HYPERCHOLESTEROLEMIA 12/26/2007  . OBESITY 12/07/2007  . DEPRESSION 12/07/2007  . URI, ACUTE 12/07/2007  . TMJ PAIN 12/07/2007  . FATIGUE 12/07/2007  . HEADACHE, CHRONIC 12/07/2007  . TOBACCO ABUSE, HX OF 12/07/2007    Past Surgical History:  Procedure Laterality Date  . CARPAL TUNNEL RELEASE Left 12/2013  . CARPAL TUNNEL RELEASE Right 03/29/2015  . CHOLECYSTECTOMY  08/27/2009  . DIAGNOSTIC LAPAROSCOPY  early 2005  . ETHMOIDECTOMY Bilateral 04/30/2006  . FUNCTIONAL ENDOSCOPIC SINUS SURGERY  04/30/2006  . HYSTEROSCOPY W/D&C  04/21/2002   with lysis of adhesions  . KNEE ARTHROSCOPY Left 02/2000  . LAMINOTOMY Left 03/27/2010   L5-S1; microdissection  . LAMINOTOMY Bilateral  07/12/2007   L4-5  . LUMBAR FUSION Left 07/18/2010   L4-5; L5-S1  . LUMBAR FUSION  04/10/2014   L3-4, L4-5, L5-S1  . NASAL TURBINATE REDUCTION  04/30/2006   inferior  . TOTAL ABDOMINAL HYSTERECTOMY  11/01/2003   with lysis of adhesions; ovaries retained  . TOTAL KNEE ARTHROPLASTY Left 12/31/2014   Procedure: TOTAL LEFT KNEE ARTHROPLASTY;  Surgeon: Frederik Pear, MD;  Location: [facility];  Service: Orthopedics;  Laterality: Left;  . TOTAL KNEE ARTHROPLASTY Right 11/11/2015   Procedure: TOTAL KNEE ARTHROPLASTY;  Surgeon: Frederik Pear, MD;  Location: [facility];  Service: Orthopedics;  Laterality: Right;  Marland Kitchen VAGINAL HYSTERECTOMY     for DUB in 10/2003; partial    OB History    No data available       Home Medications    Prior to Admission medications   Medication Sig Start Date End Date Taking? Authorizing Provider  albuterol (PROVENTIL HFA;VENTOLIN HFA) 108 (90 BASE) MCG/ACT inhaler Inhale 1-2 puffs into the lungs every 6 (six) hours as needed for wheezing or shortness of breath.     [provider]  albuterol (PROVENTIL) (5 MG/ML) 0.5% nebulizer solution Take 2.5 mg by nebulization every 6 (six) hours as needed for wheezing or shortness of breath.    [provider]  aspirin EC 325 MG tablet Take 1 tablet (325 mg total) by mouth 2 (two) times daily. Patient taking differently: Take 325 mg by mouth daily.  11/11/15   Leighton Parody, PA-C  atenolol (TENORMIN) 50 MG tablet Take 1 tablet (50 mg total) by mouth daily. 07/10/16   Pieter Partridge, DO  busPIRone (BUSPAR) 15 MG tablet Take 15 mg by mouth 2 (two) times daily.     [provider]  cetirizine (ZYRTEC) 10 MG chewable tablet Chew 10 mg by mouth at bedtime.     [provider]  chlorproMAZINE (THORAZINE) 25 MG tablet TAKE 1 TABLET(25 MG) BY MOUTH THREE TIMES DAILY AS NEEDED 10/21/16   Tomi Likens, Adam R, DO  ferrous sulfate 325 (65 FE) MG tablet Take 325 mg by mouth 2 (two) times daily with a meal.    [provider]  Fluticasone-Salmeterol (ADVAIR) 250-50 MCG/DOSE AEPB Inhale 1 puff into the lungs 2 (two) times daily.    [provider]  gabapentin (NEURONTIN) 300 MG capsule TAKE 2 CAPSULES(600 MG) BY MOUTH TWICE DAILY Patient not taking: Reported on 10/21/2016 08/18/16   Narda Amber K, DO  gabapentin (NEURONTIN) 300 MG capsule Take 2 capsules (600 mg total) by mouth 2 (two) times daily. Patient not taking: Reported on 10/21/2016 08/18/16   Pieter Partridge, DO  hydrochlorothiazide (MICROZIDE) 12.5 MG capsule Take 12.5 mg  by mouth daily.    [provider]  HYDROcodone-acetaminophen (NORCO) 7.5-325 MG tablet Take 1 tablet by mouth every 6 (six) hours as needed for moderate pain.     [provider]  loperamide (IMODIUM A-D) 2 MG tablet Take 2 mg by mouth 4 (four) times daily as needed for diarrhea or loose stools.    [provider]  mometasone (NASONEX) 50 MCG/ACT nasal spray Place 2 sprays into the nose daily.    [provider]  montelukast (SINGULAIR) 10 MG tablet Take 10 mg by mouth at bedtime.    [provider]  ondansetron (ZOFRAN) 8 MG tablet Take 1 tablet (8 mg total) by mouth every 8 (eight) hours as needed for nausea or vomiting. 09/17/16   Pieter Partridge, DO  pantoprazole (PROTONIX) 40 MG tablet Take 40 mg by mouth 2 (two) times daily before a meal.     [provider]  Probiotic Product (ALIGN PO) Take by mouth.    [provider]  rosuvastatin (CRESTOR) 10 MG tablet Take 10 mg by mouth daily.    [provider]  sertraline (ZOLOFT) 100 MG tablet Take 100-200 mg by mouth 2 (two) times daily. Take 100 mg in the am and 200 mg in the pm    [provider]  tiZANidine (ZANAFLEX) 4 MG tablet Take 1 tablet (4 mg total) by mouth at bedtime as needed for muscle spasms. 12/10/16   Pieter Partridge, DO  Topiramate ER (TROKENDI XR) 100 MG CP24 Take 1 capsule by mouth daily. 10/22/16   Pieter Partridge, DO  Topiramate ER 100 MG CP24 Take 1 capsule by mouth daily. 10/29/16   Pieter Partridge, DO  Vitamin D, Ergocalciferol, (DRISDOL) 50000 UNITS CAPS Take 50,000 Units by mouth every 7 (seven) days. mondays    [provider]    Family History Family History  Problem Relation Age of Onset  . Anesthesia problems Mother        post-op N/V  . Anxiety disorder Father   . Depression Father   . Cervical cancer Maternal Grandmother   . Cancer Maternal Grandmother        mouth  . Heart attack Maternal Grandmother 55  . Hypertension Brother     . Heart failure Brother        ICD    Social History Social History  Substance Use Topics  . Smoking status: Former Smoker    Packs/day: 0.00    Quit date: 07/20/2004  . Smokeless tobacco: Never Used  . Alcohol use No     Allergies   Lisinopril; Latex; and Oxycodone-acetaminophen   Review of Systems Review of Systems  Constitutional: Negative for fever and unexpected weight change.  HENT: Negative for rhinorrhea, sore throat and trouble swallowing.   Eyes: Negative for redness.  Respiratory: Negative for cough.   Cardiovascular: Negative for chest pain.  Gastrointestinal: Negative for abdominal pain, constipation, diarrhea, nausea and vomiting.       Negative for fecal incontinence.   Genitourinary: Negative for dysuria, flank pain, hematuria, pelvic pain, vaginal bleeding and vaginal discharge.       Negative for urinary incontinence or retention.  Musculoskeletal: Positive for back pain and neck pain. Negative for myalgias.  Skin: Negative for rash.  Neurological: Positive for numbness (paresthesia right hand) and headaches. Negative for weakness.       Denies saddle paresthesias.     Physical Exam Updated Vital Signs BP 123/65 (BP Location: Left Arm)   Pulse 84   Temp 99.6 F (37.6 C) (Oral)   Resp 17   Ht 5\' 6"  (1.676 m)   Wt 238 lb (108 kg)   SpO2 98%   BMI 38.41 kg/m   Physical Exam  Constitutional: She appears well-developed and well-nourished.  HENT:  Head: Normocephalic and atraumatic.  Mouth/Throat: Oropharynx is clear and moist.  Eyes: Conjunctivae are normal. Right eye exhibits no discharge. Left eye exhibits no discharge.  Neck: Normal range of motion. Neck supple.  Cardiovascular: Normal rate, regular rhythm and normal heart sounds.   Pulmonary/Chest: Effort normal and breath sounds normal.  Abdominal: Soft. There is no tenderness. There is no CVA tenderness.  Musculoskeletal:       Right shoulder: She exhibits decreased range of motion and  tenderness. She exhibits no bony tenderness.       Left shoulder: Normal. She exhibits normal range of motion, no tenderness and no bony tenderness.       Right elbow: Normal.      Right wrist: Normal.       Cervical back: She exhibits decreased range of motion and tenderness (Paraspinous, right neck and right upper back). She exhibits no bony tenderness.       Thoracic back: She exhibits normal range of motion, no tenderness and no bony tenderness.       Lumbar back: She exhibits normal range of motion, no tenderness and no bony tenderness.       Back:       Right upper arm: Normal.       Right forearm: Normal.       Right hand: Normal.  No step-off noted with palpation of spine.   Neurological: She is alert. She has normal strength and normal reflexes. No sensory deficit.  5/5 strength in entire lower extremities bilaterally. No sensation deficit.   Skin: Skin is warm and dry. No rash noted.  Psychiatric: She has a normal mood and affect.  Nursing note and vitals reviewed.    ED Treatments / Results   Radiology Dg Cervical Spine Complete  Result Date: 12/13/2016 CLINICAL DATA:  Pt c/o right side neck pain and right scapular pain for 1 week. Pt denies previous injury. Hx of paget's disease, HTN, osteoarthritis, stroke. EXAM: CERVICAL SPINE - COMPLETE 4+ VIEW COMPARISON:  None. FINDINGS: No fracture. No spondylolisthesis. There is some straightening of the normal cervical lordosis. Cervical disc spaces are well maintained. Neural foramina are well maintained. The soft tissues are unremarkable. IMPRESSION: 1. Mild straightening of the normal cervical lordosis. No fracture or acute finding. No degenerative change. Electronically Signed   By: Lajean Manes M.D.   On: 12/13/2016 11:05   Dg Scapula Right  Result Date: 12/13/2016 CLINICAL DATA:  Right neck pain and right scapular pain for 1 week EXAM: RIGHT SCAPULA - 2+ VIEWS COMPARISON:  Chest CT 12/25/2015 FINDINGS: Moderate degenerative  changes in the right AC and glenohumeral joints. No acute bony abnormality. Specifically, no fracture, subluxation, or dislocation. Soft tissues are intact. No acute or focal scapular abnormality. IMPRESSION: No acute findings. Electronically Signed   By: Rolm Baptise M.D.   On: 12/13/2016 11:04    Procedures Procedures (including critical care time)  Medications Ordered in ED Medications  metoCLOPramide (REGLAN) injection 10 mg (10 mg Intravenous Given 12/13/16 0927)  diphenhydrAMINE (BENADRYL) injection 25 mg (25 mg Intravenous Given 12/13/16 0921)  ketorolac (TORADOL) 15 MG/ML injection 15 mg (15 mg Intravenous Given 12/13/16 0623)  HYDROcodone-acetaminophen (NORCO/VICODIN) 5-325 MG per tablet 2 tablet (2 tablets Oral Given 12/13/16 1152)  tiZANidine (ZANAFLEX) tablet 4 mg (4 mg Oral Given 12/13/16 1153)     Initial Impression / Assessment and Plan / ED Course  I have reviewed the triage vital signs and the nursing notes.  Pertinent labs & imaging results that were available during my care of the patient were reviewed by me and considered in my medical decision making (see chart for details).     Patient seen and examined.   Vernard Gambles is very tearful regarding her current symptoms. She feels like her complaints, especially the pain around her right scapula, has gone poorly addressed for a long time. We discussed utility of x-rays and scans, what is appropriate and not appropriate to perform today. She has seen neurosurgery  for lower back fusions in the past, but her doctor retired. To help reassure the patient, I have ordered imaging of the neck and scapula, especially in light of her Paget's disease. Discussed that these may very well, and hopefully will be negative.  I will treat her symptoms today with Reglan and Benadryl for her headache and Toradol for musculoskeletal pain in hopes of improving her pain level at the current time. She does not have any neurological deficits which would  necessitate advanced imaging of the head or neck today.  Vital signs reviewed and are as follows: BP 123/65 (BP Location: Left Arm)   Pulse 84   Temp 99.6 F (37.6 C) (Oral)   Resp 17   Ht 5\' 6"  (1.676 m)   Wt 238 lb (108 kg)   SpO2 98%   BMI 38.41 kg/m   Patient with HA improvement after treatment. Back and shoulder continues to hurt. Pending x-ray. Oral pain medication and muscle relaxer ordered.    Patient informed of results. Will discharge to home with additional pain medication and muscle relaxer. Encouraged PCP follow-up and follow-up with her orthopedist for further evaluation. She sees Dr. Mayer Camel.   Encourage patient to return with uncontrolled pain, new symptoms such as arm weakness, vision changes, or other concerns.  Patient counseled on use of narcotic pain medications. Counseled not to combine these medications with others containing tylenol. Urged not to drink alcohol, drive, or perform any other activities that requires focus while taking these medications. The patient verbalizes understanding and agrees with the plan.  Patient counseled on proper use of muscle relaxant medication.  They were told not to drink alcohol, drive any vehicle, or do any dangerous activities while taking this medication.  Patient verbalized understanding.   Final Clinical Impressions(s) / ED Diagnoses   Final diagnoses:  Neck pain  Upper back pain   Patient with history of migraine headaches, followed by neurology, with neck and upper back pain worsening over the past week. No neurological deficits. Pain is changed with movement and palpation and seems musculoskeletal in nature. X-ray imaging reveals some arthritis in the shoulder, cervical spine is normal. Patient somewhat improved in emergency department with treatment. No concern for vascular etiology. Do not suspect pneumothorax or other pulmonary etiology. Will continue symptomatic treatment with follow-up as above.  New  Prescriptions Discharge Medication List as of 12/13/2016 12:54 PM    START taking these medications   Details  HYDROcodone-acetaminophen (NORCO/VICODIN) 5-325 MG tablet Take 1-2 tablets every 6 hours as needed for severe pain, Print    tiZANidine (ZANAFLEX) 4 MG capsule Take 1 capsule (4 mg total) by mouth 3 (three) times daily., Starting Sun 12/13/2016, Print         Carlisle Cater, PA-C 12/13/16 1353    Daleen Bo, MD 12/13/16 [phone]

## 2016-12-13 NOTE — Discharge Instructions (Signed)
Please read and follow all provided instructions.  Your diagnoses today include:  1. Neck pain   2. Upper back pain     Tests performed today include:  Vital signs - see below for your results today  X-ray of your scapula - shows some arthritis in the shoulder  X-ray of the neck - no concerning findings  Medications prescribed:   Vicodin (hydrocodone/acetaminophen) - narcotic pain medication  DO NOT drive or perform any activities that require you to be awake and alert because this medicine can make you drowsy. BE VERY CAREFUL not to take multiple medicines containing Tylenol (also called acetaminophen). Doing so can lead to an overdose which can damage your liver and cause liver failure and possibly death.   Zanaflex - muscle relaxer medication  DO NOT drive or perform any activities that require you to be awake and alert because this medicine can make you drowsy.   Take any prescribed medications only as directed.  Home care instructions:   Follow any educational materials contained in this packet  Please rest, use ice or heat on your back for the next several days  Do not lift, push, pull anything more than 10 pounds for the next week  Follow-up instructions: Please follow-up with your primary care provider in the next 1 week for further evaluation of your symptoms.   Return instructions:  SEEK IMMEDIATE MEDICAL ATTENTION IF YOU HAVE:  New numbness, tingling, weakness, or problem with the use of your arms or legs  Severe back pain not relieved with medications  Loss control of your bowels or bladder  Increasing pain in any areas of the body (such as chest or abdominal pain)  Shortness of breath, dizziness, or fainting.   Worsening nausea (feeling sick to your stomach), vomiting, fever, or sweats  Any other emergent concerns regarding your health   Additional Information:  Your vital signs today were: BP (!) 103/58    Pulse 64    Temp 99.6 F (37.6 C)  (Oral)    Resp 20    Ht 5\' 6"  (1.676 m)    Wt 238 lb (108 kg)    LMP 10/14/2002 (Within Weeks)    SpO2 100%    BMI 38.41 kg/m  If your blood pressure (BP) was elevated above 135/85 this visit, please have this repeated by your doctor within one month. --------------

## 2016-12-14 ENCOUNTER — Encounter: Payer: Self-pay | Admitting: Neurology

## 2016-12-14 ENCOUNTER — Other Ambulatory Visit: Payer: Self-pay | Admitting: Neurology

## 2016-12-14 MED ORDER — PREDNISONE 10 MG PO TABS: ORAL_TABLET | ORAL | 0 refills | Status: DC

## 2016-12-14 NOTE — Progress Notes (Signed)
Will prescribe prednisone taper to treat acute neck pain.

## 2016-12-17 ENCOUNTER — Other Ambulatory Visit: Payer: Self-pay | Admitting: Nurse Practitioner

## 2016-12-17 DIAGNOSIS — Z1231 Encounter for screening mammogram for malignant neoplasm of breast: Secondary | ICD-10-CM

## 2016-12-24 ENCOUNTER — Other Ambulatory Visit: Payer: Self-pay | Admitting: Neurology

## 2017-01-22 ENCOUNTER — Encounter: Payer: Self-pay | Admitting: Neurology

## 2017-01-22 ENCOUNTER — Ambulatory Visit (INDEPENDENT_AMBULATORY_CARE_PROVIDER_SITE_OTHER): Payer: Medicare Other | Admitting: Neurology

## 2017-01-22 VITALS — BP 112/76 | HR 81 | Ht 66.0 in | Wt 237.0 lb

## 2017-01-22 DIAGNOSIS — G43409 Hemiplegic migraine, not intractable, without status migrainosus: Secondary | ICD-10-CM

## 2017-01-22 DIAGNOSIS — G43009 Migraine without aura, not intractable, without status migrainosus: Secondary | ICD-10-CM

## 2017-01-22 MED ORDER — CHLORPROMAZINE HCL 25 MG PO TABS: ORAL_TABLET | ORAL | 0 refills | Status: DC

## 2017-01-22 MED ORDER — TOPIRAMATE ER 100 MG PO CAP24
ORAL_CAPSULE | ORAL | 0 refills | Status: DC
Start: 2017-01-22 — End: 2017-08-03

## 2017-01-22 NOTE — Addendum Note (Signed)
Addended by: Oda Kilts on: 01/22/2017 01:28 PM   Modules accepted: Orders

## 2017-01-22 NOTE — Progress Notes (Signed)
NEUROLOGY FOLLOW UP OFFICE NOTE  CYNDE MENARD [id number]  HISTORY OF PRESENT ILLNESS: Wanda [name] is a 51 year old right-handed woman with Paget's disease, GERD, IBSD, hyperlipidemia, carotid artery disease and bilateral knee replacements who follows up for chronic migraine and hemiplegic migraine.   UPDATE: She has been diagnosed with fibromyalgia and osteoarthritis.  She is experiencing severe pain in the neck, shoulders, hands, feet and back.  Thorazine has been helpful to relieve the migraines.  The muscle spasms in the neck trigger the migraines.  She plans on seeing PT specializing in fibromyalgia. Intensity:  6-10; March:  6-10/10 Duration:  A couple of hours or until sleeps; March:  Up to 2 days.  Frequency:  1 to 2 days a week; March: 1 to 2 days a week. Current NSAIDS:  .  no Current analgesics:  Hydrocodone (for pain related to Paget's) Current triptans:  no Current anti-emetic:  Zofran 8mg  Current muscle relaxants:  Tizanidine 4mg  (helps neck pain but not headache) Current Antihypertensive medications:  atenolol 50mg  Current Antidepressant medications:  sertraline 100mg  Current Anxiolytic:  BuSpar Current Anticonvulsant medications:  Trokendi XR 100mg  Current Vitamins/Herbal/Supplements:  ferrous sulfate Current Antihistamines/Decongestants:  no Other therapy: thorazine 25mg  for abortive therapy.   Caffeine:  No Alcohol:  no Smoker:  no Diet:  Hydrates, no fried foods or dairy, no sweets or soda Exercise:  Walks twice daily with the dog Depression/stress:  Affected by pain Sleep: fair   Carotid doppler from 06/26/16 showed minor carotid atherosclerosis but no hemodynamically significant stenosis. BMP from 05/09/16 was unremarkable.   HISTORY:  On 10/28/11, she was hospitalized for severe left sided headache associated with slurred speech, left facial droop and left sided arm and leg weakness and numbness.  Headache lasted a day and weakness lasted a  few days but she reports mild residual weakness.  MRI and MRA of head were personally reviewed and were negative for acute stroke.  Carotid doppler showed 40-59% bilateral ICA stenosis with antegrade flow in the vertebral arteries.  Echo showed EF 60% with no PFO or ASD.  She was diagnosed with TIA and started on ASA.  She has subsequently had several other similar episodes.  CT head and MRI brain from 06/02/12, CT head and MRI of brain from 10/28/12, CT head and MRI brain from 06/02/15, and MRI brain from 10/03/15 were all personally reviewed and were unremarkable.  MRI/MRA/MRV of head from 02/28/13 were unremarkable.   She has left sided headache without the weakness as well.  It is severe and associated with nausea and photophobia.  They last 1 to 2 days. They occur 2 to 3 days per week.  She has 25 headache days per month. Triggers/exacerbating factors:  Dairy, cigarette smoke Relieving factors:  rest Activity:  Cannot function with severe headache.   Past NSAIDS:  Naproxen (GI upset), ibuprofen, Cambia (GI upset) Past analgesics:  Excedrin, Tylenol, Percocet (hives), Norco, tramadol (hot flashes) Past abortive triptans:  Relpax Past muscle relaxants: no Past anti-emetic:  Phenergan 25mg  Past antihypertensive medications:  verapamil 24hr 180mg , HCTZ, lisinopril Past antidepressant medications:  nortriptyline 50mg  Past anticonvulsant medications:  Depakote, zonisamide, topiramate 100mg  twice daily (effective but caused memory problems and paresthesias), gabapentin 300mg  three times daily Past vitamins/Herbal/Supplements:  no Other past therapy:  Botox (2 rounds)   Family history of headache:  mother  PAST MEDICAL HISTORY: Past Medical History:  Diagnosis Date  . Acne   . Anemia    takes iron supplement  .  Anxiety   . Asthma   . Chronic bronchitis ([place])   . Depression   . Family history of adverse reaction to anesthesia    pt's mother has hx. of post-op N/V  . Fibromyalgia   . GERD  (gastroesophageal reflux disease)   . History of blood transfusion   . History of TIA (transient ischemic attack)    x 2; left-sided weakness  . Hypercholesterolemia    no current med.  . Hypertension    states under control with meds., has been on med. since 06/2011  . Keloid 03/8337   umbilical  . Migraines   . Osteoarthritis   . Osteoarthritis   . Paget disease of bone   . PONV (postoperative nausea and vomiting)    states severe  . Seasonal allergies   . Stroke ([place]) 10/27/2012   TIA   1- 10/29/11-weakness left side    MEDICATIONS: Current Outpatient Prescriptions on File Prior to Visit  Medication Sig Dispense Refill  . albuterol (PROVENTIL HFA;VENTOLIN HFA) 108 (90 BASE) MCG/ACT inhaler Inhale 1-2 puffs into the lungs every 6 (six) hours as needed for wheezing or shortness of breath.     Marland Kitchen albuterol (PROVENTIL) (5 MG/ML) 0.5% nebulizer solution Take 2.5 mg by nebulization every 6 (six) hours as needed for wheezing or shortness of breath.    Marland Kitchen aspirin EC 325 MG tablet Take 1 tablet (325 mg total) by mouth 2 (two) times daily. (Patient taking differently: Take 325 mg by mouth daily. ) 30 tablet 0  . atenolol (TENORMIN) 50 MG tablet TAKE 1 TABLET(50 MG) BY MOUTH DAILY 30 tablet 5  . busPIRone (BUSPAR) 15 MG tablet Take 15 mg by mouth 2 (two) times daily.     . cetirizine (ZYRTEC) 10 MG chewable tablet Chew 10 mg by mouth at bedtime.     . ferrous sulfate 325 (65 FE) MG tablet Take 325 mg by mouth 2 (two) times daily with a meal.    . Fluticasone-Salmeterol (ADVAIR) 250-50 MCG/DOSE AEPB Inhale 1 puff into the lungs 2 (two) times daily.    . hydrochlorothiazide (MICROZIDE) 12.5 MG capsule Take 12.5 mg by mouth daily.    Marland Kitchen HYDROcodone-acetaminophen (NORCO/VICODIN) 5-325 MG tablet Take 1-2 tablets every 6 hours as needed for severe pain 8 tablet 0  . loperamide (IMODIUM A-D) 2 MG tablet Take 2 mg by mouth 4 (four) times daily as needed for diarrhea or loose stools.    . mometasone  (NASONEX) 50 MCG/ACT nasal spray Place 2 sprays into the nose daily.    . montelukast (SINGULAIR) 10 MG tablet Take 10 mg by mouth at bedtime.    . ondansetron (ZOFRAN) 8 MG tablet Take 1 tablet (8 mg total) by mouth every 8 (eight) hours as needed for nausea or vomiting. 20 tablet 0  . pantoprazole (PROTONIX) 40 MG tablet Take 40 mg by mouth 2 (two) times daily before a meal.     . predniSONE (DELTASONE) 10 MG tablet Take 6tabs x1day, then 5tabs x1day, then 4tabs x1day, then 3tabs x1day, then 2tabs x1day, then 1tab x1day, then STOP 21 tablet 0  . Probiotic Product (ALIGN PO) Take by mouth.    . rosuvastatin (CRESTOR) 10 MG tablet Take 10 mg by mouth daily.    . sertraline (ZOLOFT) 100 MG tablet Take 100-200 mg by mouth 2 (two) times daily. Take 100 mg in the am and 200 mg in the pm    . tiZANidine (ZANAFLEX) 4 MG capsule Take 1 capsule (4  mg total) by mouth 3 (three) times daily. 15 capsule 0  . tiZANidine (ZANAFLEX) 4 MG tablet Take 1 tablet (4 mg total) by mouth at bedtime as needed for muscle spasms. 90 tablet 1  . Topiramate ER (TROKENDI XR) 100 MG CP24 Take 1 capsule by mouth daily. 14 capsule 0  . Vitamin D, Ergocalciferol, (DRISDOL) 50000 UNITS CAPS Take 50,000 Units by mouth every 7 (seven) days. mondays     No current facility-administered medications on file prior to visit.     ALLERGIES: Allergies  Allergen Reactions  . Lisinopril Swelling and Other (See Comments)    SWELLING FACE/LIPS/NECK  . Latex Rash  . Oxycodone-Acetaminophen Itching and Rash    Also percocet    FAMILY HISTORY: Family History  Problem Relation Age of Onset  . Anesthesia problems Mother        post-op N/V  . Anxiety disorder Father   . Depression Father   . Cervical cancer Maternal Grandmother   . Cancer Maternal Grandmother        mouth  . Heart attack Maternal Grandmother 55  . Hypertension Brother   . Heart failure Brother        ICD    SOCIAL HISTORY: Social History   Social History    . Marital status: Divorced    Spouse name: N/A  . Number of children: 2  . Years of education: N/A   Occupational History  . health advisor Unemployed    [employer]   Social History Main Topics  . Smoking status: Former Smoker    Packs/day: 0.00    Quit date: 07/20/2004  . Smokeless tobacco: Never Used  . Alcohol use No  . Drug use: No  . Sexual activity: Yes    Partners: Male   Other Topics Concern  . Not on file   Social History Narrative   Lives in [place] with daughter      Servando Snare (c) [phone]   nigel       (c) [phone]    REVIEW OF SYSTEMS: Constitutional: No fevers, chills, or sweats, no generalized fatigue, change in appetite Eyes: No visual changes, double vision, eye pain Ear, nose and throat: No hearing loss, ear pain, nasal congestion, sore throat Cardiovascular: No chest pain, palpitations Respiratory:  No shortness of breath at rest or with exertion, wheezes GastrointestinaI: No nausea, vomiting, diarrhea, abdominal pain, fecal incontinence Genitourinary:  No dysuria, urinary retention or frequency Musculoskeletal:  Diffuse pain Integumentary: No rash, pruritus, skin lesions Neurological: as above Psychiatric: depression Endocrine: No palpitations, fatigue, diaphoresis, mood swings, change in appetite, change in weight, increased thirst Hematologic/Lymphatic:  No purpura, petechiae. Allergic/Immunologic: no itchy/runny eyes, nasal congestion, recent allergic reactions, rashes  PHYSICAL EXAM: Vitals:   01/22/17 1140  BP: 112/76  Pulse: 81   General: No acute distress.  Patient appears well-groomed.   Head:  Normocephalic/atraumatic Eyes:  Fundi examined but not visualized Neck: supple but diffuse pain and decreased range of motion Heart:  Regular rate and rhythm Lungs:  Clear to auscultation bilaterally Back:  paraspinal tenderness Neurological Exam: alert and oriented to person, place, and time. Attention span and concentration intact,  recent and remote memory intact, fund of knowledge intact.  Speech fluent and not dysarthric, language intact.  CN II-XII intact. Bulk and tone normal, difficult to assess due to pain and decreased range of motion but muscle strength grossly 5/5 throughout.  Sensation to light touch, temperature and vibration intact.  Deep tendon reflexes 2+ throughout, toes  downgoing.  Finger to nose  testing intact.  Antalgic gait.  With cane.  IMPRESSION: Migraine without aura Hemiplegic migraine  PLAN: 1.  Trokendi XR 100mg  at bedtime (sample given) 2.  Thorazine for abortive therapy 3.  Tizanidine for neck pain 4.  Agree with PT 5.  Follow up in 5 months.  25 minutes spent face to face with patient, over 50% spent discussing management.  Metta Clines, DO  CC:  Milagros Evener, MD

## 2017-01-22 NOTE — Patient Instructions (Signed)
Continue Trokendi XR 100mg  daily Continue tizanidine and thorazine as directed Follow up in 5 months

## 2017-01-28 ENCOUNTER — Ambulatory Visit
Admission: RE | Admit: 2017-01-28 | Discharge: 2017-01-28 | Disposition: A | Discharge status: Home / Self Care | Payer: Medicare Other | Source: Ambulatory Visit | Attending: Nurse Practitioner | Admitting: Nurse Practitioner

## 2017-01-28 DIAGNOSIS — Z1231 Encounter for screening mammogram for malignant neoplasm of breast: Secondary | ICD-10-CM

## 2017-01-29 ENCOUNTER — Other Ambulatory Visit: Payer: Self-pay | Admitting: Neurology

## 2017-05-03 ENCOUNTER — Other Ambulatory Visit: Payer: Self-pay | Admitting: Neurology

## 2017-05-05 ENCOUNTER — Other Ambulatory Visit: Payer: Self-pay | Admitting: Neurology

## 2017-06-17 ENCOUNTER — Other Ambulatory Visit: Payer: Self-pay | Admitting: Neurology

## 2017-06-27 ENCOUNTER — Encounter: Payer: Self-pay | Admitting: Neurology

## 2017-06-28 ENCOUNTER — Ambulatory Visit: Payer: Medicare Other | Admitting: Neurology

## 2017-08-03 ENCOUNTER — Ambulatory Visit: Payer: Medicare Other | Admitting: Neurology

## 2017-08-03 ENCOUNTER — Encounter: Payer: Self-pay | Admitting: Neurology

## 2017-08-03 ENCOUNTER — Other Ambulatory Visit: Payer: Self-pay | Admitting: Neurology

## 2017-08-03 VITALS — BP 100/70 | HR 79 | Ht 66.0 in | Wt 236.8 lb

## 2017-08-03 DIAGNOSIS — G43009 Migraine without aura, not intractable, without status migrainosus: Secondary | ICD-10-CM | POA: Diagnosis not present

## 2017-08-03 DIAGNOSIS — G43409 Hemiplegic migraine, not intractable, without status migrainosus: Secondary | ICD-10-CM | POA: Diagnosis not present

## 2017-08-03 MED ORDER — TOPIRAMATE ER 100 MG PO CAP24: 1.0000 | ORAL_CAPSULE | Freq: Every day | ORAL | 1 refills | Status: DC

## 2017-08-03 MED ORDER — CHLORPROMAZINE HCL 25 MG PO TABS: 25.0000 mg | ORAL_TABLET | Freq: Three times a day (TID) | ORAL | 1 refills | Status: DC | PRN

## 2017-08-03 MED ORDER — TIZANIDINE HCL 4 MG PO TABS: ORAL_TABLET | ORAL | 1 refills | Status: DC

## 2017-08-03 NOTE — Progress Notes (Signed)
NEUROLOGY FOLLOW UP OFFICE NOTE  [name] [id number]  HISTORY OF PRESENT ILLNESS: Wanda [name] is a 52 year old right-handed woman with Paget's disease, osteoarthritis, fibromyalgia, GERD, IBSD, hyperlipidemia, carotid artery disease and bilateral knee replacements who follows up for chronic migraine and hemiplegic migraine.   UPDATE: Intensity:  6-10  June: 6-10/10 Duration:  Takes Thorazine and goes to sleep.  Wakes up feeling well, June:  Up to 2 days.  Frequency:  1 day a week; June: 1 to 2 days a week. Current NSAIDS:  no Current analgesics:  Hydrocodone (for pain related to Paget's) Current triptans:  no Current anti-emetic: Thorazine 25mg  (for abortive therapy),  Zofran 8mg  Current muscle relaxants:  Tizanidine 8mg  at bedtime Current Antihypertensive medications:  atenolol 50mg  Current Antidepressant medications:  sertraline 100mg  Current Anxiolytic:  BuSpar Current Anticonvulsant medications:  Trokendi XR 100mg  Current Vitamins/Herbal/Supplements:  ferrous sulfate Current Antihistamines/Decongestants:  No Other therapy: dry needling  Caffeine:  No Alcohol:  no Smoker:  no Diet:  Hydrates, no fried foods or dairy, no sweets or soda Exercise:  Walks twice daily with the dog Depression/stress:  Affected by pain Sleep: fair   BMP from 05/09/16 was unremarkable.   HISTORY:  On 10/28/11, she was hospitalized for severe left sided headache associated with slurred speech, left facial droop and left sided arm and leg weakness and numbness.  Headache lasted a day and weakness lasted a few days but she reports mild residual weakness.  MRI and MRA of head were personally reviewed and were negative for acute stroke.  Carotid doppler showed 40-59% bilateral ICA stenosis with antegrade flow in the vertebral arteries.  Echo showed EF 60% with no PFO or ASD.  She was diagnosed with TIA and started on ASA.  She has subsequently had several other similar episodes.  CT head  and MRI brain from 06/02/12, CT head and MRI of brain from 10/28/12, CT head and MRI brain from 06/02/15, and MRI brain from 10/03/15 were all personally reviewed and were unremarkable.  MRI/MRA/MRV of head from 02/28/13 were unremarkable.  Carotid doppler from 06/26/16 showed minor carotid atherosclerosis but no hemodynamically significant stenosis.   She has left sided headache without the weakness as well.  It is severe and associated with nausea and photophobia.  They last 1 to 2 days. They occur 2 to 3 days per week.  She has 25 headache days per month. Triggers/exacerbating factors:  Dairy, cigarette smoke Relieving factors:  rest Activity:  Cannot function with severe headache.   Past NSAIDS:  Naproxen (GI upset), ibuprofen, Cambia (GI upset) Past analgesics:  Excedrin, Tylenol, Percocet (hives), Norco, tramadol (hot flashes) Past abortive triptans:  Relpax Past muscle relaxants: no Past anti-emetic:  Phenergan 25mg  Past antihypertensive medications:  verapamil 24hr 180mg , HCTZ, lisinopril Past antidepressant medications:  nortriptyline 50mg  Past anticonvulsant medications:  Depakote, zonisamide, topiramate 100mg  twice daily (effective but caused memory problems and paresthesias), gabapentin 300mg  three times daily Past vitamins/Herbal/Supplements:  no Other past therapy:  Botox (2 rounds)   Family history of headache:  mother  PAST MEDICAL HISTORY: Past Medical History:  Diagnosis Date  . Acne   . Anemia    takes iron supplement  . Anxiety   . Asthma   . Chronic bronchitis ([place])   . Depression   . Family history of adverse reaction to anesthesia    pt's mother has hx. of post-op N/V  . Fibromyalgia   . GERD (gastroesophageal reflux disease)   . History of  blood transfusion   . History of TIA (transient ischemic attack)    x 2; left-sided weakness  . Hypercholesterolemia    no current med.  . Hypertension    states under control with meds., has been on med. since 06/2011  .  Keloid 08/3760   umbilical  . Migraines   . Osteoarthritis   . Osteoarthritis   . Paget disease of bone   . PONV (postoperative nausea and vomiting)    states severe  . Seasonal allergies   . Stroke ([place]) 10/27/2012   TIA   1- 10/29/11-weakness left side    MEDICATIONS: Current Outpatient Medications on File Prior to Visit  Medication Sig Dispense Refill  . doxycycline (DORYX) 100 MG EC tablet Take 100 mg by mouth.    Marland Kitchen albuterol (PROVENTIL HFA;VENTOLIN HFA) 108 (90 BASE) MCG/ACT inhaler Inhale 1-2 puffs into the lungs every 6 (six) hours as needed for wheezing or shortness of breath.     Marland Kitchen albuterol (PROVENTIL) (5 MG/ML) 0.5% nebulizer solution Take 2.5 mg by nebulization every 6 (six) hours as needed for wheezing or shortness of breath.    Marland Kitchen aspirin EC 325 MG tablet Take 1 tablet (325 mg total) by mouth 2 (two) times daily. (Patient taking differently: Take 325 mg by mouth daily. ) 30 tablet 0  . atenolol (TENORMIN) 50 MG tablet TAKE 1 TABLET(50 MG) BY MOUTH DAILY 30 tablet 5  . busPIRone (BUSPAR) 15 MG tablet Take 15 mg by mouth 2 (two) times daily.     . cetirizine (ZYRTEC) 10 MG chewable tablet Chew 10 mg by mouth at bedtime.     . ferrous sulfate 325 (65 FE) MG tablet Take 325 mg by mouth 2 (two) times daily with a meal.    . Fluticasone-Salmeterol (ADVAIR) 250-50 MCG/DOSE AEPB Inhale 1 puff into the lungs 2 (two) times daily.    . hydrochlorothiazide (MICROZIDE) 12.5 MG capsule Take 12.5 mg by mouth daily.    . mometasone (NASONEX) 50 MCG/ACT nasal spray Place 2 sprays into the nose daily.    . montelukast (SINGULAIR) 10 MG tablet Take 10 mg by mouth at bedtime.    . ondansetron (ZOFRAN) 8 MG tablet TAKE 1 TABLET(8 MG) BY MOUTH EVERY 8 HOURS AS NEEDED FOR NAUSEA OR VOMITING 20 tablet 0  . pantoprazole (PROTONIX) 40 MG tablet Take 40 mg by mouth 2 (two) times daily before a meal.     . predniSONE (DELTASONE) 10 MG tablet Take 6tabs x1day, then 5tabs x1day, then 4tabs x1day, then 3tabs  x1day, then 2tabs x1day, then 1tab x1day, then STOP (Patient not taking: Reported on 08/03/2017) 21 tablet 0  . Probiotic Product (ALIGN PO) Take by mouth.    . rosuvastatin (CRESTOR) 10 MG tablet Take 10 mg by mouth daily.    . sertraline (ZOLOFT) 100 MG tablet Take 100-200 mg by mouth 2 (two) times daily. Take 100 mg in the am and 200 mg in the pm    . Vitamin D, Ergocalciferol, (DRISDOL) 50000 UNITS CAPS Take 50,000 Units by mouth every 7 (seven) days. mondays     No current facility-administered medications on file prior to visit.     ALLERGIES: Allergies  Allergen Reactions  . Lisinopril Swelling and Other (See Comments)    SWELLING FACE/LIPS/NECK  . Latex Rash  . Oxycodone-Acetaminophen Itching and Rash    Also percocet    FAMILY HISTORY: Family History  Problem Relation Age of Onset  . Anesthesia problems Mother  post-op N/V  . Anxiety disorder Father   . Depression Father   . Cervical cancer Maternal Grandmother   . Cancer Maternal Grandmother        mouth  . Heart attack Maternal Grandmother 55  . Hypertension Brother   . Heart failure Brother        ICD    SOCIAL HISTORY: Social History   Socioeconomic History  . Marital status: Divorced    Spouse name: Not on file  . Number of children: 2  . Years of education: Not on file  . Highest education level: Not on file  Social Needs  . Financial resource strain: Not on file  . Food insecurity - worry: Not on file  . Food insecurity - inability: Not on file  . Transportation needs - medical: Not on file  . Transportation needs - non-medical: Not on file  Occupational History  . Occupation: Engineer, maintenance: UNEMPLOYED    Comment: [employer]  Tobacco Use  . Smoking status: Former Smoker    Packs/day: 0.00    Last attempt to quit: 07/20/2004    Years since quitting: 13.0  . Smokeless tobacco: Never Used  Substance and Sexual Activity  . Alcohol use: No  . Drug use: No  . Sexual  activity: Yes    Partners: Male  Other Topics Concern  . Not on file  Social History Narrative   Lives in [place] with daughter      Servando Snare (c) [phone]   nigel       (c) [phone]    REVIEW OF SYSTEMS: Constitutional: No fevers, chills, or sweats, no generalized fatigue, change in appetite Eyes: No visual changes, double vision, eye pain Ear, nose and throat: No hearing loss, ear pain, nasal congestion, sore throat Cardiovascular: No chest pain, palpitations Respiratory:  No shortness of breath at rest or with exertion, wheezes GastrointestinaI: No nausea, vomiting, diarrhea, abdominal pain, fecal incontinence Genitourinary:  No dysuria, urinary retention or frequency Musculoskeletal:  Generalized pain Integumentary: No rash, pruritus, skin lesions Neurological: as above Psychiatric: No depression, insomnia, anxiety Endocrine: No palpitations, fatigue, diaphoresis, mood swings, change in appetite, change in weight, increased thirst Hematologic/Lymphatic:  No purpura, petechiae. Allergic/Immunologic: no itchy/runny eyes, nasal congestion, recent allergic reactions, rashes  PHYSICAL EXAM: Vitals:   08/03/17 1311  BP: 100/70  Pulse: 79  SpO2: 94%   General: No acute distress.  Patient appears well-groomed.    Head:  Normocephalic/atraumatic Eyes:  Fundi examined but not visualized Neck: supple, no paraspinal tenderness, full range of motion Heart:  Regular rate and rhythm Lungs:  Clear to auscultation bilaterally Back: No paraspinal tenderness Neurological Exam: alert and oriented to person, place, and time. Attention span and concentration intact, recent and remote memory intact, fund of knowledge intact.  Speech fluent and not dysarthric, language intact.  CN II-XII intact. Bulk and tone normal, muscle strength 5/5 throughout.  Sensation to light touch  intact.  Deep tendon reflexes 2+ throughout.  Finger to nose testing intact.  Gait normal, Romberg  negative.  IMPRESSION: Migraine/hemiplegic migraine  PLAN: 1.  Refill Trokendi XR 100mg  2.  Refill tizanidine 8mg  at bedtime 3.  Refill Thorazine 25mg  as needed for abortive migraine therapy and nausea 4.  Follow up in 6 months.  Metta Clines, DO  CC:  Waldron Labs, MD

## 2017-08-03 NOTE — Patient Instructions (Signed)
1.  Refilled Thorazine, Trokendi and tizanidine 2.  Follow up in 6 months.

## 2017-09-23 ENCOUNTER — Encounter (HOSPITAL_COMMUNITY): Payer: Self-pay | Admitting: *Deleted

## 2017-09-23 ENCOUNTER — Inpatient Hospital Stay (HOSPITAL_COMMUNITY)
Admission: EM | Admit: 2017-09-23 | Discharge: 2017-09-28 | DRG: 202 | Disposition: A | Discharge status: Home / Self Care | Payer: Medicare Other | Attending: Internal Medicine | Admitting: Internal Medicine

## 2017-09-23 ENCOUNTER — Emergency Department (HOSPITAL_COMMUNITY): Payer: Medicare Other

## 2017-09-23 DIAGNOSIS — R000 Tachycardia, unspecified: Secondary | ICD-10-CM | POA: Diagnosis present

## 2017-09-23 DIAGNOSIS — F419 Anxiety disorder, unspecified: Secondary | ICD-10-CM | POA: Diagnosis present

## 2017-09-23 DIAGNOSIS — Z886 Allergy status to analgesic agent status: Secondary | ICD-10-CM

## 2017-09-23 DIAGNOSIS — Z792 Long term (current) use of antibiotics: Secondary | ICD-10-CM

## 2017-09-23 DIAGNOSIS — I69854 Hemiplegia and hemiparesis following other cerebrovascular disease affecting left non-dominant side: Secondary | ICD-10-CM

## 2017-09-23 DIAGNOSIS — R0602 Shortness of breath: Secondary | ICD-10-CM

## 2017-09-23 DIAGNOSIS — M889 Osteitis deformans of unspecified bone: Secondary | ICD-10-CM | POA: Diagnosis present

## 2017-09-23 DIAGNOSIS — F329 Major depressive disorder, single episode, unspecified: Secondary | ICD-10-CM | POA: Diagnosis present

## 2017-09-23 DIAGNOSIS — E860 Dehydration: Secondary | ICD-10-CM | POA: Diagnosis present

## 2017-09-23 DIAGNOSIS — Z888 Allergy status to other drugs, medicaments and biological substances status: Secondary | ICD-10-CM

## 2017-09-23 DIAGNOSIS — M1990 Unspecified osteoarthritis, unspecified site: Secondary | ICD-10-CM | POA: Diagnosis present

## 2017-09-23 DIAGNOSIS — B9729 Other coronavirus as the cause of diseases classified elsewhere: Secondary | ICD-10-CM | POA: Diagnosis present

## 2017-09-23 DIAGNOSIS — Z96653 Presence of artificial knee joint, bilateral: Secondary | ICD-10-CM | POA: Diagnosis present

## 2017-09-23 DIAGNOSIS — Z981 Arthrodesis status: Secondary | ICD-10-CM

## 2017-09-23 DIAGNOSIS — Z6838 Body mass index (BMI) 38.0-38.9, adult: Secondary | ICD-10-CM

## 2017-09-23 DIAGNOSIS — Z885 Allergy status to narcotic agent status: Secondary | ICD-10-CM

## 2017-09-23 DIAGNOSIS — Z7984 Long term (current) use of oral hypoglycemic drugs: Secondary | ICD-10-CM

## 2017-09-23 DIAGNOSIS — E872 Acidosis: Secondary | ICD-10-CM | POA: Diagnosis present

## 2017-09-23 DIAGNOSIS — Z91040 Latex allergy status: Secondary | ICD-10-CM

## 2017-09-23 DIAGNOSIS — Z7951 Long term (current) use of inhaled steroids: Secondary | ICD-10-CM

## 2017-09-23 DIAGNOSIS — J45909 Unspecified asthma, uncomplicated: Secondary | ICD-10-CM

## 2017-09-23 DIAGNOSIS — R791 Abnormal coagulation profile: Secondary | ICD-10-CM | POA: Diagnosis present

## 2017-09-23 DIAGNOSIS — J209 Acute bronchitis, unspecified: Secondary | ICD-10-CM | POA: Diagnosis not present

## 2017-09-23 DIAGNOSIS — R042 Hemoptysis: Secondary | ICD-10-CM | POA: Diagnosis present

## 2017-09-23 DIAGNOSIS — G43409 Hemiplegic migraine, not intractable, without status migrainosus: Secondary | ICD-10-CM | POA: Diagnosis present

## 2017-09-23 DIAGNOSIS — E669 Obesity, unspecified: Secondary | ICD-10-CM | POA: Diagnosis present

## 2017-09-23 DIAGNOSIS — E7800 Pure hypercholesterolemia, unspecified: Secondary | ICD-10-CM | POA: Diagnosis present

## 2017-09-23 DIAGNOSIS — J42 Unspecified chronic bronchitis: Secondary | ICD-10-CM | POA: Diagnosis present

## 2017-09-23 DIAGNOSIS — M25511 Pain in right shoulder: Secondary | ICD-10-CM | POA: Diagnosis present

## 2017-09-23 DIAGNOSIS — J45901 Unspecified asthma with (acute) exacerbation: Secondary | ICD-10-CM | POA: Diagnosis present

## 2017-09-23 DIAGNOSIS — K219 Gastro-esophageal reflux disease without esophagitis: Secondary | ICD-10-CM | POA: Diagnosis present

## 2017-09-23 DIAGNOSIS — B9789 Other viral agents as the cause of diseases classified elsewhere: Secondary | ICD-10-CM | POA: Diagnosis present

## 2017-09-23 DIAGNOSIS — Z79899 Other long term (current) drug therapy: Secondary | ICD-10-CM

## 2017-09-23 DIAGNOSIS — M797 Fibromyalgia: Secondary | ICD-10-CM | POA: Diagnosis present

## 2017-09-23 DIAGNOSIS — D649 Anemia, unspecified: Secondary | ICD-10-CM | POA: Diagnosis present

## 2017-09-23 DIAGNOSIS — I10 Essential (primary) hypertension: Secondary | ICD-10-CM | POA: Diagnosis present

## 2017-09-23 DIAGNOSIS — Z87891 Personal history of nicotine dependence: Secondary | ICD-10-CM

## 2017-09-23 DIAGNOSIS — R0789 Other chest pain: Secondary | ICD-10-CM | POA: Diagnosis present

## 2017-09-23 LAB — INFLUENZA PANEL BY PCR (TYPE A & B)
INFLAPCR: NEGATIVE
Influenza B By PCR: NEGATIVE

## 2017-09-23 LAB — LACTIC ACID, PLASMA: LACTIC ACID, VENOUS: 2.8 mmol/L — AB (ref 0.5–1.9)

## 2017-09-23 LAB — I-STAT TROPONIN, ED
Troponin i, poc: 0 ng/mL (ref 0.00–0.08)
Troponin i, poc: 0.01 ng/mL (ref 0.00–0.08)

## 2017-09-23 LAB — CBC
HEMATOCRIT: 40.4 % (ref 36.0–46.0)
Hemoglobin: 12.9 g/dL (ref 12.0–15.0)
MCH: 29.5 pg (ref 26.0–34.0)
MCHC: 31.9 g/dL (ref 30.0–36.0)
MCV: 92.4 fL (ref 78.0–100.0)
Platelets: 163 10*3/uL (ref 150–400)
RBC: 4.37 MIL/uL (ref 3.87–5.11)
RDW: 14.4 % (ref 11.5–15.5)
WBC: 9.5 10*3/uL (ref 4.0–10.5)

## 2017-09-23 LAB — BASIC METABOLIC PANEL
ANION GAP: 6 (ref 5–15)
BUN: 10 mg/dL (ref 6–20)
CHLORIDE: 110 mmol/L (ref 101–111)
CO2: 28 mmol/L (ref 22–32)
Calcium: 9.1 mg/dL (ref 8.9–10.3)
Creatinine, Ser: 0.86 mg/dL (ref 0.44–1.00)
GFR calc Af Amer: >60 mL/min (ref >60)
GFR calc non Af Amer: >60 mL/min (ref >60)
GLUCOSE: 117 mg/dL — AB (ref 65–99)
POTASSIUM: 4 mmol/L (ref 3.5–5.1)
Sodium: 144 mmol/L (ref 135–145)

## 2017-09-23 LAB — I-STAT BETA HCG BLOOD, ED (MC, WL, AP ONLY): I-stat hCG, quantitative: <5 m[IU]/mL (ref <5)

## 2017-09-23 LAB — D-DIMER, QUANTITATIVE (NOT AT ARMC): D DIMER QUANT: 1.29 ug{FEU}/mL — AB (ref 0.00–0.50)

## 2017-09-23 MED ORDER — ALBUTEROL SULFATE (2.5 MG/3ML) 0.083% IN NEBU
2.5000 mg | INHALATION_SOLUTION | Freq: Two times a day (BID) | RESPIRATORY_TRACT | Status: DC
  Administered 2017-09-24 – 2017-09-28 (×9): 2.5 mg via RESPIRATORY_TRACT
  Filled 2017-09-23 (×10): qty 3

## 2017-09-23 MED ORDER — GUAIFENESIN-CODEINE 100-10 MG/5ML PO SOLN
10.0000 mL | Freq: Four times a day (QID) | ORAL | Status: DC | PRN
  Administered 2017-09-25 – 2017-09-27 (×4): 10 mL via ORAL
  Filled 2017-09-23 (×4): qty 10

## 2017-09-23 MED ORDER — ROSUVASTATIN CALCIUM 10 MG PO TABS
10.0000 mg | ORAL_TABLET | Freq: Every day | ORAL | Status: DC
  Administered 2017-09-24 – 2017-09-28 (×5): 10 mg via ORAL
  Filled 2017-09-23 (×5): qty 1

## 2017-09-23 MED ORDER — TOPIRAMATE 25 MG PO TABS
50.0000 mg | ORAL_TABLET | Freq: Two times a day (BID) | ORAL | Status: DC
  Filled 2017-09-23: qty 2

## 2017-09-23 MED ORDER — IPRATROPIUM BROMIDE 0.02 % IN SOLN
0.5000 mg | Freq: Once | RESPIRATORY_TRACT | Status: AC
  Administered 2017-09-23: 0.5 mg via RESPIRATORY_TRACT
  Filled 2017-09-23: qty 2.5

## 2017-09-23 MED ORDER — IOPAMIDOL (ISOVUE-370) INJECTION 76%
INTRAVENOUS | Status: AC
  Filled 2017-09-23: qty 100

## 2017-09-23 MED ORDER — ALBUTEROL SULFATE (2.5 MG/3ML) 0.083% IN NEBU
5.0000 mg | INHALATION_SOLUTION | Freq: Once | RESPIRATORY_TRACT | Status: AC
  Administered 2017-09-23: 5 mg via RESPIRATORY_TRACT
  Filled 2017-09-23: qty 6

## 2017-09-23 MED ORDER — METHYLPREDNISOLONE SODIUM SUCC 125 MG IJ SOLR
125.0000 mg | Freq: Once | INTRAMUSCULAR | Status: AC
  Administered 2017-09-23: 125 mg via INTRAVENOUS
  Filled 2017-09-23: qty 2

## 2017-09-23 MED ORDER — PANTOPRAZOLE SODIUM 40 MG PO TBEC
40.0000 mg | DELAYED_RELEASE_TABLET | Freq: Two times a day (BID) | ORAL | Status: DC
  Administered 2017-09-24 – 2017-09-28 (×9): 40 mg via ORAL
  Filled 2017-09-23 (×9): qty 1

## 2017-09-23 MED ORDER — ATENOLOL 50 MG PO TABS
50.0000 mg | ORAL_TABLET | Freq: Every day | ORAL | Status: DC
  Administered 2017-09-24 – 2017-09-28 (×5): 50 mg via ORAL
  Filled 2017-09-23 (×5): qty 1

## 2017-09-23 MED ORDER — IPRATROPIUM-ALBUTEROL 0.5-2.5 (3) MG/3ML IN SOLN
3.0000 mL | Freq: Once | RESPIRATORY_TRACT | Status: AC
  Administered 2017-09-23: 3 mL via RESPIRATORY_TRACT

## 2017-09-23 MED ORDER — MAGNESIUM SULFATE 2 GM/50ML IV SOLN
2.0000 g | Freq: Once | INTRAVENOUS | Status: AC
  Administered 2017-09-23: 2 g via INTRAVENOUS
  Filled 2017-09-23: qty 50

## 2017-09-23 MED ORDER — IOPAMIDOL (ISOVUE-370) INJECTION 76%
100.0000 mL | Freq: Once | INTRAVENOUS | Status: AC | PRN
  Administered 2017-09-23: 88 mL via INTRAVENOUS

## 2017-09-23 MED ORDER — TIZANIDINE HCL 4 MG PO TABS
8.0000 mg | ORAL_TABLET | Freq: Every day | ORAL | Status: DC
  Administered 2017-09-23 – 2017-09-27 (×5): 8 mg via ORAL
  Filled 2017-09-23 (×5): qty 2

## 2017-09-23 MED ORDER — FERROUS SULFATE 325 (65 FE) MG PO TABS
325.0000 mg | ORAL_TABLET | Freq: Every day | ORAL | Status: DC
Start: 2017-09-24 — End: 2017-09-28
  Administered 2017-09-24 – 2017-09-28 (×5): 325 mg via ORAL
  Filled 2017-09-23 (×6): qty 1

## 2017-09-23 MED ORDER — ALBUTEROL SULFATE (2.5 MG/3ML) 0.083% IN NEBU
2.5000 mg | INHALATION_SOLUTION | Freq: Four times a day (QID) | RESPIRATORY_TRACT | Status: DC | PRN
Start: 2017-09-23 — End: 2017-09-28

## 2017-09-23 MED ORDER — MOMETASONE FURO-FORMOTEROL FUM 200-5 MCG/ACT IN AERO
2.0000 | INHALATION_SPRAY | Freq: Two times a day (BID) | RESPIRATORY_TRACT | Status: DC
  Administered 2017-09-24 – 2017-09-25 (×3): 2 via RESPIRATORY_TRACT
  Filled 2017-09-23: qty 8.8

## 2017-09-23 MED ORDER — SODIUM CHLORIDE 0.9 % IV BOLUS (SEPSIS)
1000.0000 mL | Freq: Once | INTRAVENOUS | Status: AC
  Administered 2017-09-24: 1000 mL via INTRAVENOUS

## 2017-09-23 MED ORDER — MONTELUKAST SODIUM 10 MG PO TABS
10.0000 mg | ORAL_TABLET | Freq: Every day | ORAL | Status: DC
  Administered 2017-09-23 – 2017-09-27 (×5): 10 mg via ORAL
  Filled 2017-09-23 (×5): qty 1

## 2017-09-23 MED ORDER — ALBUTEROL SULFATE (2.5 MG/3ML) 0.083% IN NEBU: 5.0000 mg | INHALATION_SOLUTION | RESPIRATORY_TRACT | Status: DC

## 2017-09-23 MED ORDER — CHLORPROMAZINE HCL 25 MG PO TABS
25.0000 mg | ORAL_TABLET | Freq: Three times a day (TID) | ORAL | Status: DC
  Administered 2017-09-23 – 2017-09-28 (×14): 25 mg via ORAL
  Filled 2017-09-23 (×15): qty 1

## 2017-09-23 MED ORDER — ACETAMINOPHEN 325 MG PO TABS
650.0000 mg | ORAL_TABLET | Freq: Once | ORAL | Status: AC
  Administered 2017-09-23: 650 mg via ORAL
  Filled 2017-09-23: qty 2

## 2017-09-23 MED ORDER — SODIUM CHLORIDE 0.9 % IJ SOLN
INTRAMUSCULAR | Status: AC
  Administered 2017-09-23: 15:00:00
  Filled 2017-09-23: qty 50

## 2017-09-23 MED ORDER — SERTRALINE HCL 100 MG PO TABS
200.0000 mg | ORAL_TABLET | Freq: Every day | ORAL | Status: DC
  Administered 2017-09-23 – 2017-09-27 (×5): 200 mg via ORAL
  Filled 2017-09-23 (×3): qty 2
  Filled 2017-09-23: qty 4
  Filled 2017-09-23: qty 2

## 2017-09-23 MED ORDER — IPRATROPIUM-ALBUTEROL 0.5-2.5 (3) MG/3ML IN SOLN
3.0000 mL | Freq: Once | RESPIRATORY_TRACT | Status: DC
  Filled 2017-09-23: qty 3

## 2017-09-23 MED ORDER — LORATADINE 10 MG PO TABS
10.0000 mg | ORAL_TABLET | Freq: Every day | ORAL | Status: DC
  Administered 2017-09-24 – 2017-09-28 (×5): 10 mg via ORAL
  Filled 2017-09-23 (×6): qty 1

## 2017-09-23 MED ORDER — ASPIRIN EC 325 MG PO TBEC
325.0000 mg | DELAYED_RELEASE_TABLET | Freq: Every day | ORAL | Status: DC
  Administered 2017-09-24 – 2017-09-28 (×5): 325 mg via ORAL
  Filled 2017-09-23 (×5): qty 1

## 2017-09-23 MED ORDER — BUSPIRONE HCL 5 MG PO TABS
15.0000 mg | ORAL_TABLET | Freq: Two times a day (BID) | ORAL | Status: DC
  Administered 2017-09-23 – 2017-09-28 (×10): 15 mg via ORAL
  Filled 2017-09-23: qty 1
  Filled 2017-09-23: qty 2
  Filled 2017-09-23 (×8): qty 1

## 2017-09-23 NOTE — ED Provider Notes (Signed)
[facility] DEPT Provider Note   CSN: [id number] Arrival date & time: 09/23/17  1540     History   Chief Complaint Chief Complaint  Patient presents with  . Shortness of Breath  . Chest Pain    HPI Wanda [name] is a 52 y.o. female.  HPI   Patient is a 52 year old female with a history of asthma, chronic bronchitis, depression, fibromyalgia, GERD, TIA, Paget disease of the bone, migraine headaches, hypercholesterolemia, and hypertension presenting for cough, shortness of breath, and chest pain.  Patient reports that she has had a respiratory infection on and off since January 25.  Patient reports she had a deep cough starting on August 20, 2017 that was worse than she ever had.  Patient was prescribed Augmentin, prednisone, promethazine cough syrup by her primary care provider, and had interim improvement.  Patient continued to do her Advair Singulair, and seasonal allergy regimen at this time.  Patient reports that she re-presented to her primary care provider on February 19th.  Patient reports that for the past 2 days, after sitting in the [facility] waiting room with her children's father for a respiratory infection, she began having worsening cough that she has had over this entire course.  No fever or chills over past 48 hours. Patient reports she has had pain in the right shoulder blade, shortness of breath, and central chest pressure.  Patient did not experience the symptoms with her other respiratory infections.  Patient denies any personal history of DVT or PE.  Patient reports that she does have a brother with a history of an unprovoked DVT.  Patient denies any estrogen use, history of cancer, recent immobilization, hospitalization, surgery.  Patient does report that she has had some scant hemoptysis over the past 48 hours.  Past Medical History:  Diagnosis Date  . Acne   . Anemia    takes iron supplement  . Anxiety   . Asthma   .  Chronic bronchitis ([place])   . Depression   . Family history of adverse reaction to anesthesia    pt's mother has hx. of post-op N/V  . Fibromyalgia   . GERD (gastroesophageal reflux disease)   . History of blood transfusion   . History of TIA (transient ischemic attack)    x 2; left-sided weakness  . Hypercholesterolemia    no current med.  . Hypertension    states under control with meds., has been on med. since 06/2011  . Keloid 0/8676   umbilical  . Migraines   . Osteoarthritis   . Osteoarthritis   . Paget disease of bone   . PONV (postoperative nausea and vomiting)    states severe  . Seasonal allergies   . Stroke ([place]) 10/27/2012   TIA   1- 10/29/11-weakness left side    Patient Active Problem List   Diagnosis Date Noted  . Paget disease of bone 05/04/2016  . Bone lesion 01/22/2016  . Osteoarthritis of right knee 11/11/2015  . Primary osteoarthritis of right knee 11/10/2015  . Arthritis of knee 12/31/2014  . Primary osteoarthritis of left knee 10/12/2014  . Lumbar spinal stenosis 04/10/2014  . 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 Cervical disc disorder with radiculopathy of cervical region 05/03/2013  . Weakness of left side of body 10/28/2012  . Complicated migraine 19/50/9326  . Disturbance of skin sensation 06/02/2012  . CVA (cerebral infarction)? vs Atypical Migraine 10/28/2011  . Sinusitis 04/10/2011  . Borderline blood pressure 04/10/2011  . CHRONIC  RHINITIS 04/28/2010  . Lumbar L5 diskektomy 02/17/2010  . GERD 08/20/2009  . ABDOMINAL PAIN, UNSPECIFIED 08/20/2009  . HYPERCHOLESTEROLEMIA 12/26/2007  . OBESITY 12/07/2007  . DEPRESSION 12/07/2007  . URI, ACUTE 12/07/2007  . TMJ PAIN 12/07/2007  . FATIGUE 12/07/2007  . HEADACHE, CHRONIC 12/07/2007  . TOBACCO ABUSE, HX OF 12/07/2007    Past Surgical History:  Procedure Laterality Date  . CARPAL TUNNEL RELEASE Left 12/2013  . CARPAL TUNNEL RELEASE Right 03/29/2015  . CHOLECYSTECTOMY  08/27/2009  .  DIAGNOSTIC LAPAROSCOPY  early 2005  . ETHMOIDECTOMY Bilateral 04/30/2006  . FUNCTIONAL ENDOSCOPIC SINUS SURGERY  04/30/2006  . HYSTEROSCOPY W/D&C  04/21/2002   with lysis of adhesions  . KNEE ARTHROSCOPY Left 02/2000  . LAMINOTOMY Left 03/27/2010   L5-S1; microdissection  . LAMINOTOMY Bilateral 07/12/2007   L4-5  . LUMBAR FUSION Left 07/18/2010   L4-5; L5-S1  . LUMBAR FUSION  04/10/2014   L3-4, L4-5, L5-S1  . NASAL TURBINATE REDUCTION  04/30/2006   inferior  . TOTAL ABDOMINAL HYSTERECTOMY  11/01/2003   with lysis of adhesions; ovaries retained  . TOTAL KNEE ARTHROPLASTY Left 12/31/2014   Procedure: TOTAL LEFT KNEE ARTHROPLASTY;  Surgeon: Frederik Pear, MD;  Location: [facility];  Service: Orthopedics;  Laterality: Left;  . TOTAL KNEE ARTHROPLASTY Right 11/11/2015   Procedure: TOTAL KNEE ARTHROPLASTY;  Surgeon: Frederik Pear, MD;  Location: [facility];  Service: Orthopedics;  Laterality: Right;  Marland Kitchen VAGINAL HYSTERECTOMY     for DUB in 10/2003; partial    OB History    No data available       Home Medications    Prior to Admission medications   Medication Sig Start Date End Date Taking? Authorizing Provider  albuterol (PROVENTIL HFA;VENTOLIN HFA) 108 (90 BASE) MCG/ACT inhaler Inhale 1-2 puffs into the lungs every 6 (six) hours as needed for wheezing or shortness of breath.    Yes [provider]  albuterol (PROVENTIL) (5 MG/ML) 0.5% nebulizer solution Take 2.5 mg by nebulization every 6 (six) hours as needed for wheezing or shortness of breath.   Yes [provider]  aspirin EC 325 MG tablet Take 1 tablet (325 mg total) by mouth 2 (two) times daily. Patient taking differently: Take 325 mg by mouth daily.  11/11/15  Yes Joanell Rising K, PA-C  atenolol (TENORMIN) 50 MG tablet TAKE 1 TABLET(50 MG) BY MOUTH DAILY 12/24/16  Yes Jaffe, Adam R, DO  busPIRone (BUSPAR) 15 MG tablet Take 15 mg by mouth 2 (two) times daily.    Yes [provider]  cetirizine (ZYRTEC) 10 MG chewable tablet  Chew 10 mg by mouth at bedtime.    Yes [provider]  chlorproMAZINE (THORAZINE) 25 MG tablet TAKE 1 TABLET(25 MG) BY MOUTH THREE TIMES DAILY AS NEEDED 08/03/17  Yes Tomi Likens, Adam R, DO  doxycycline (DORYX) 100 MG EC tablet Take 100 mg by mouth.   Yes [provider]  ferrous sulfate 325 (65 FE) MG tablet Take 325 mg by mouth at bedtime.    Yes [provider]  Fluticasone-Salmeterol (ADVAIR) 250-50 MCG/DOSE AEPB Inhale 1 puff into the lungs 2 (two) times daily.   Yes [provider]  hydrochlorothiazide (MICROZIDE) 12.5 MG capsule Take 12.5 mg by mouth daily.   Yes [provider]  HYDROcodone-homatropine (HYCODAN) 5-1.5 MG/5ML syrup Take 5 mLs by mouth every 6 (six) hours as needed for cough.   Yes [provider]  mometasone (NASONEX) 50 MCG/ACT nasal spray Place 2 sprays into the  nose daily.   Yes [provider]  montelukast (SINGULAIR) 10 MG tablet Take 10 mg by mouth at bedtime.   Yes [provider]  ondansetron (ZOFRAN) 8 MG tablet TAKE 1 TABLET(8 MG) BY MOUTH EVERY 8 HOURS AS NEEDED FOR NAUSEA OR VOMITING 01/29/17  Yes Tomi Likens, Adam R, DO  pantoprazole (PROTONIX) 40 MG tablet Take 40 mg by mouth 2 (two) times daily before a meal.    Yes [provider]  predniSONE (DELTASONE) 10 MG tablet Take 6tabs x1day, then 5tabs x1day, then 4tabs x1day, then 3tabs x1day, then 2tabs x1day, then 1tab x1day, then STOP 12/14/16  Yes Jaffe, Adam R, DO  promethazine-dextromethorphan (PROMETHAZINE-DM) 6.25-15 MG/5ML syrup Take by mouth every 6 (six) hours as needed for cough.   Yes [provider]  rosuvastatin (CRESTOR) 10 MG tablet Take 10 mg by mouth daily.   Yes [provider]  sertraline (ZOLOFT) 100 MG tablet Take 100-200 mg by mouth 2 (two) times daily. Take 100 mg in the am and 200 mg in the pm   Yes [provider]  tiZANidine (ZANAFLEX) 4 MG tablet TAKE 2 TABLETS BY MOUTH AT BEDTIME AS NEEDED FOR  MUSCLE SPASMS 08/03/17  Yes Tomi Likens, Adam R, DO  Topiramate ER (TROKENDI XR) 100 MG CP24 Take 1 capsule by mouth daily. 08/03/17  Yes Jaffe, Adam R, DO  Vitamin D, Ergocalciferol, (DRISDOL) 50000 UNITS CAPS Take 50,000 Units by mouth every 7 (seven) days. mondays   Yes [provider]    Family History Family History  Problem Relation Age of Onset  . Anesthesia problems Mother        post-op N/V  . Anxiety disorder Father   . Depression Father   . Cervical cancer Maternal Grandmother   . Cancer Maternal Grandmother        mouth  . Heart attack Maternal Grandmother 55  . Hypertension Brother   . Heart failure Brother        ICD    Social History Social History   Tobacco Use  . Smoking status: Former Smoker    Packs/day: 0.00    Last attempt to quit: 07/20/2004    Years since quitting: 13.1  . Smokeless tobacco: Never Used  Substance Use Topics  . Alcohol use: No  . Drug use: No     Allergies   Lisinopril; Latex; and Oxycodone-acetaminophen   Review of Systems Review of Systems  Constitutional: Negative for chills and fever.  HENT: Positive for congestion and rhinorrhea.   Respiratory: Positive for cough and shortness of breath.   Cardiovascular: Positive for chest pain. Negative for leg swelling.  Gastrointestinal: Negative for abdominal pain, nausea and vomiting.  Genitourinary: Negative for dysuria and flank pain.  Musculoskeletal: Positive for back pain.  Neurological: Positive for headaches. Negative for syncope and light-headedness.     Physical Exam Updated Vital Signs BP (!) 104/57 (BP Location: Left Arm)   Pulse 99   Temp 98.4 F (36.9 C) (Oral)   Resp 17   LMP 10/14/2002 (Within Weeks)   SpO2 96%   Physical Exam  Constitutional: She appears well-developed and well-nourished. No distress.  HENT:  Head: Normocephalic and atraumatic.  Mild cobblestoning of posterior pharynx.  Eyes: Conjunctivae and EOM are normal. Pupils are equal, round,  and reactive to light.  Neck: Normal range of motion. Neck supple.  Cardiovascular: Normal rate, S1 normal and S2 normal.  No murmur heard. Tachycardia noted.  Pulmonary/Chest: She has no wheezes. She has  rhonchi. She has no rales.  Tachypnea noted.  No prolonged expiratory phase.  Rhonchi noted in upper lung fields.  No wheezing.  No rales.  Abdominal: Soft. She exhibits no distension. There is no tenderness. There is no guarding.  Musculoskeletal: Normal range of motion. She exhibits no edema or deformity.  No lower extremity edema.  No calf tenderness.  Lymphadenopathy:    She has no cervical adenopathy.  Neurological: She is alert.  Cranial nerves grossly intact. Patient moves extremities symmetrically and with good coordination.  Skin: Skin is warm and dry. No rash noted. No erythema.  Psychiatric: She has a normal mood and affect. Her behavior is normal. Judgment and thought content normal.  Nursing note and vitals reviewed.    ED Treatments / Results  Labs (all labs ordered are listed, but only abnormal results are displayed) Labs Reviewed  BASIC METABOLIC PANEL - Abnormal; Notable for the following components:      Result Value   Glucose, Bld 117 (*)    All other components within normal limits  CBC  D-DIMER, QUANTITATIVE (NOT AT [facility])  I-STAT TROPONIN, ED  I-STAT BETA HCG BLOOD, ED ([facility], [facility], [facility] ONLY)    EKG  EKG Interpretation None       Radiology Dg Chest 2 View  Result Date: 09/23/2017 CLINICAL DATA:  Dry cough beginning in January of this year which has become productive. Shortness of breath and chest pain. EXAM: CHEST  2 VIEW COMPARISON:  PA and lateral chest 09/14/2017, 09/23/2017 and 09/03/2015. FINDINGS: The lungs are clear. Heart size is normal. There is no pneumothorax or pleural effusion. No acute bony abnormality. Thoracic spondylosis is noted. IMPRESSION: No acute disease. Electronically Signed   By: Inge Rise M.D.   On: 09/23/2017 12:10     Procedures Procedures (including critical care time)  Medications Ordered in ED Medications  methylPREDNISolone sodium succinate (SOLU-MEDROL) 125 mg/2 mL injection 125 mg (not administered)  magnesium sulfate IVPB 2 g 50 mL (not administered)  albuterol (PROVENTIL) (2.5 MG/3ML) 0.083% nebulizer solution 5 mg (not administered)  ipratropium (ATROVENT) nebulizer solution 0.5 mg (not administered)  acetaminophen (TYLENOL) tablet 650 mg (not administered)     Initial Impression / Assessment and Plan / ED Course  I have reviewed the triage vital signs and the nursing notes.  Pertinent labs & imaging results that were available during my care of the patient were reviewed by me and considered in my medical decision making (see chart for details).  Clinical Course as of Sep 23 2022  Thu Sep 23, 2017  1413 EKG is normal sinus rhythm rate of 96 normal intervals nonspecific T wave flattening  [MB]  5257 52 year old female with 2 months of shortness of breath chest tightness here with increased symptoms.  She is nontoxic-appearing but her lab work is significant for an elevated d-dimer.  Is in for a CT if we can find a good site for IV access.  [MB]  6433 Patient reevaluated. Patient is more tachycardic but had just had albuterol.   [AM]  2024 Lactic acid noted. Patient admitted at this time.  [AM]    Clinical Course User Index [AM] Albesa Seen, PA-C [MB] Hayden Rasmussen, MD    Patient is nontoxic-appearing, afebrile, no acute distress at this time.  Patient is tachypneic and tachycardic up to 103 on my examination.  Patient may be representing with respiratory infection, most likely viral, however pretest probability remains high for pulmonary embolism.  Chest x-ray is  negative for clear infiltrate or other cardiopulmonary abnormality.  Lab work demonstrates a d-dimer of 1.29, but otherwise unremarkable.  EKG unchanged from prior, and troponin negative.  Will obtain CT PA to rule out  pulmonary embolism as well as examined pulmonary parenchyma given patient's long course of cough.   CTPA is negative for PE.  Delta troponin negative.  Flu swab pending.  Despite reassurance of CTPA and laboratory analysis, patient is persistently tachycardic, and had pulse of 122-125 while ambulating.  Patient felt "dizzy" while performing this.   Patient reevaluated by Dr. Deno Etienne. Will place hospital medicine consult regarding this patient to discuss observation of patient. Spoke with Dr. Theresa Duty of [facility] hospitalist, who will admit the patient.  Additional DuoNeb's ordered to maximize therapy.  Final Clinical Impressions(s) / ED Diagnoses   Final diagnoses:  Shortness of breath  Tachycardia    ED Discharge Orders    None       Tamala Julian 09/24/17 0200    Hayden Rasmussen, MD 09/25/17 1134

## 2017-09-23 NOTE — H&P (Signed)
History and Physical    Wanda [name] ZOX:[id number] DOB: [birth year] DOA: 09/23/2017  PCP: Aretta Nip, MD   Patient coming from: Home   Chief Complaint: SOB, Cough  HPI: Wanda [name] is a 52 y.o. female with medical history significant for hemiplegic migraines, Asthma, pagets dx, presented to the ED with complaints of Cough and shortness of breath of 2 days duration associated with wheezing.  Cough is productive of greenish yellow sputum.  Patient reports since January 25 when she woke up with a dry cough, fatigue she has mostly being feeling poorly. She saw her PCP and was prescribed a course of Augmentin and prednisone taper, noted some improvement after 3 days, but said by February 12 she was feeling poorly again saw her provider as prescribed Z-Pak and a prednisone taper, did not have much improvement. Saw a provider again February 19, another course of antibiotics and steroids. She was beginning to feel better and back to baseline with mostly resolved cough by the 25th march, when she was in the ED because her husband was sick, so she was exposed to sick contacts, and then this present episode of shortness of breath and new productive cough started.  Flu check had always been negative.  Reports cough is worse when lying down. She denies fevers or chills, but has myalgias from fibromyalgia, and persistent coughing and SOB.  She endorses some chest pain, center of her chest and back pain.  Positive family history of heart attack in her 62 younger brother.  She did quit smoking cigarettes 2005, previously smoked half pack a day.  ED Course: Temperature 99.1 a few hours after she was given Tylenol for headache.  Patient also tachycardic with ambulation up to 125, O2 sats greater than 95% on room air.  Chest x-ray negative for acute abnormality.  D-dimer elevated 1.29 subsequent CT angiogram chest-no PE. Lactic acid elevated 2.8, negative influenza.  Patient was given duo nebs  and albuterol nebulizer in ED, 125mg  Solu-Medrol also given.  I-STAT troponin 0.01.  EKG sinus tachycardia  Review of Systems: As per HPI otherwise 10 point review of systems negative.  Past Medical History:  Diagnosis Date  . Acne   . Anemia    takes iron supplement  . Anxiety   . Asthma   . Chronic bronchitis ([place])   . Depression   . Family history of adverse reaction to anesthesia    pt's mother has hx. of post-op N/V  . Fibromyalgia   . GERD (gastroesophageal reflux disease)   . History of blood transfusion   . History of TIA (transient ischemic attack)    x 2; left-sided weakness  . Hypercholesterolemia    no current med.  . Hypertension    states under control with meds., has been on med. since 06/2011  . Keloid 02/1190   umbilical  . Migraines   . Osteoarthritis   . Osteoarthritis   . Paget disease of bone   . PONV (postoperative nausea and vomiting)    states severe  . Seasonal allergies   . Stroke ([place]) 10/27/2012   TIA   1- 10/29/11-weakness left side    Past Surgical History:  Procedure Laterality Date  . CARPAL TUNNEL RELEASE Left 12/2013  . CARPAL TUNNEL RELEASE Right 03/29/2015  . CHOLECYSTECTOMY  08/27/2009  . DIAGNOSTIC LAPAROSCOPY  early 2005  . ETHMOIDECTOMY Bilateral 04/30/2006  . FUNCTIONAL ENDOSCOPIC SINUS SURGERY  04/30/2006  . HYSTEROSCOPY W/D&C  04/21/2002   with lysis of  adhesions  . KNEE ARTHROSCOPY Left 02/2000  . LAMINOTOMY Left 03/27/2010   L5-S1; microdissection  . LAMINOTOMY Bilateral 07/12/2007   L4-5  . LUMBAR FUSION Left 07/18/2010   L4-5; L5-S1  . LUMBAR FUSION  04/10/2014   L3-4, L4-5, L5-S1  . NASAL TURBINATE REDUCTION  04/30/2006   inferior  . TOTAL ABDOMINAL HYSTERECTOMY  11/01/2003   with lysis of adhesions; ovaries retained  . TOTAL KNEE ARTHROPLASTY Left 12/31/2014   Procedure: TOTAL LEFT KNEE ARTHROPLASTY;  Surgeon: Frederik Pear, MD;  Location: [facility];  Service: Orthopedics;  Laterality: Left;  . TOTAL KNEE ARTHROPLASTY Right 11/11/2015     Procedure: TOTAL KNEE ARTHROPLASTY;  Surgeon: Frederik Pear, MD;  Location: [facility];  Service: Orthopedics;  Laterality: Right;  Marland Kitchen VAGINAL HYSTERECTOMY     for DUB in 10/2003; partial    reports that she quit smoking about 13 years ago. She smoked 0.00 packs per day. she has never used smokeless tobacco. She reports that she does not drink alcohol or use drugs.  Allergies  Allergen Reactions  . Lisinopril Swelling and Other (See Comments)    SWELLING FACE/LIPS/NECK  . Latex Rash  . Oxycodone-Acetaminophen Itching and Rash    Also percocet, Patient states "can tolerate"    Family History  Problem Relation Age of Onset  . Anesthesia problems Mother        post-op N/V  . Anxiety disorder Father   . Depression Father   . Cervical cancer Maternal Grandmother   . Cancer Maternal Grandmother        mouth  . Heart attack Maternal Grandmother 55  . Hypertension Brother   . Heart failure Brother        ICD    Prior to Admission medications   Medication Sig Start Date End Date Taking? Authorizing Provider  albuterol (PROVENTIL HFA;VENTOLIN HFA) 108 (90 BASE) MCG/ACT inhaler Inhale 1-2 puffs into the lungs every 6 (six) hours as needed for wheezing or shortness of breath.    Yes [provider]  albuterol (PROVENTIL) (5 MG/ML) 0.5% nebulizer solution Take 2.5 mg by nebulization every 6 (six) hours as needed for wheezing or shortness of breath.   Yes [provider]  aspirin EC 325 MG tablet Take 1 tablet (325 mg total) by mouth 2 (two) times daily. Patient taking differently: Take 325 mg by mouth daily.  11/11/15  Yes Joanell Rising K, PA-C  atenolol (TENORMIN) 50 MG tablet TAKE 1 TABLET(50 MG) BY MOUTH DAILY 12/24/16  Yes Jaffe, Adam R, DO  busPIRone (BUSPAR) 15 MG tablet Take 15 mg by mouth 2 (two) times daily.    Yes [provider]  cetirizine (ZYRTEC) 10 MG chewable tablet Chew 10 mg by mouth at bedtime.    Yes [provider]  chlorproMAZINE  (THORAZINE) 25 MG tablet TAKE 1 TABLET(25 MG) BY MOUTH THREE TIMES DAILY AS NEEDED 08/03/17  Yes Tomi Likens, Adam R, DO  doxycycline (DORYX) 100 MG EC tablet Take 100 mg by mouth.   Yes [provider]  ferrous sulfate 325 (65 FE) MG tablet Take 325 mg by mouth at bedtime.    Yes [provider]  Fluticasone-Salmeterol (ADVAIR) 250-50 MCG/DOSE AEPB Inhale 1 puff into the lungs 2 (two) times daily.   Yes [provider]  hydrochlorothiazide (MICROZIDE) 12.5 MG capsule Take 12.5 mg by mouth daily.   Yes [provider]  HYDROcodone-homatropine (HYCODAN) 5-1.5 MG/5ML syrup Take 5 mLs by mouth every 6 (six) hours as needed for  cough.   Yes [provider]  mometasone (NASONEX) 50 MCG/ACT nasal spray Place 2 sprays into the nose daily.   Yes [provider]  montelukast (SINGULAIR) 10 MG tablet Take 10 mg by mouth at bedtime.   Yes [provider]  ondansetron (ZOFRAN) 8 MG tablet TAKE 1 TABLET(8 MG) BY MOUTH EVERY 8 HOURS AS NEEDED FOR NAUSEA OR VOMITING 01/29/17  Yes Tomi Likens, Adam R, DO  pantoprazole (PROTONIX) 40 MG tablet Take 40 mg by mouth 2 (two) times daily before a meal.    Yes [provider]  predniSONE (DELTASONE) 10 MG tablet Take 6tabs x1day, then 5tabs x1day, then 4tabs x1day, then 3tabs x1day, then 2tabs x1day, then 1tab x1day, then STOP 12/14/16  Yes Jaffe, Adam R, DO  promethazine-dextromethorphan (PROMETHAZINE-DM) 6.25-15 MG/5ML syrup Take by mouth every 6 (six) hours as needed for cough.   Yes [provider]  rosuvastatin (CRESTOR) 10 MG tablet Take 10 mg by mouth daily.   Yes [provider]  sertraline (ZOLOFT) 100 MG tablet Take 100-200 mg by mouth 2 (two) times daily. Take 100 mg in the am and 200 mg in the pm   Yes [provider]  tiZANidine (ZANAFLEX) 4 MG tablet TAKE 2 TABLETS BY MOUTH AT BEDTIME AS NEEDED FOR MUSCLE SPASMS 08/03/17  Yes Tomi Likens, Adam R, DO  Topiramate ER (TROKENDI XR) 100 MG  CP24 Take 1 capsule by mouth daily. 08/03/17  Yes Jaffe, Adam R, DO  Vitamin D, Ergocalciferol, (DRISDOL) 50000 UNITS CAPS Take 50,000 Units by mouth every 7 (seven) days. mondays   Yes [provider]    Physical Exam: Vitals:   09/23/17 1830 09/23/17 2000 09/23/17 2050 09/23/17 2100  BP: 132/69 (!) 163/98  (!) 177/90  Pulse: (!) 117 (!) 111  (!) 104  Resp: (!) 30 17  14   Temp: 99.1 F (37.3 C)     TempSrc: Oral     SpO2: 100% 97% 99% 99%    Constitutional: NAD, calm, comfortable, obese Vitals:   09/23/17 1830 09/23/17 2000 09/23/17 2050 09/23/17 2100  BP: 132/69 (!) 163/98  (!) 177/90  Pulse: (!) 117 (!) 111  (!) 104  Resp: (!) 30 17  14   Temp: 99.1 F (37.3 C)     TempSrc: Oral     SpO2: 100% 97% 99% 99%   Eyes: PERRL, lids and conjunctivae normal ENMT: Mucous membranes are moist. Posterior pharynx clear of any exudate or lesions. Neck: normal, supple, no masses, no thyromegaly Respiratory: clear to auscultation bilaterally, no wheezing, no crackles. Normal respiratory effort. No accessory muscle use.  Cardiovascular: Tachycardic, Regular rate and rhythm, no murmurs / rubs / gallops. No extremity edema. 2+ pedal pulses. No carotid bruits.  Abdomen: no tenderness, no masses palpated. No hepatosplenomegaly. Bowel sounds positive.  Musculoskeletal: no clubbing / cyanosis. No joint deformity upper and lower extremities. Good ROM, no contractures. Normal muscle tone.  Skin: no rashes, lesions, ulcers. No induration Neurologic: CN 2-12 grossly intact. Strength 5/5 in all 4.  Psychiatric: Normal judgment and insight. Alert and oriented x 3. Normal mood.   Labs on Admission: I have personally reviewed following labs and imaging studies  CBC: Recent Labs  Lab 09/23/17 1126  WBC 9.5  HGB 12.9  HCT 40.4  MCV 92.4  PLT 169   Basic Metabolic Panel: Recent Labs  Lab 09/23/17 1126  NA 144  K 4.0  CL 110  CO2 28  GLUCOSE 117*  BUN 10  CREATININE 0.86  CALCIUM  9.1   Urine analysis:    Component Value Date/Time   COLORURINE YELLOW 05/09/2016 [place] 05/09/2016 1355   LABSPEC 1.012 05/09/2016 1355   PHURINE 7.0 05/09/2016 1355   GLUCOSEU NEGATIVE 05/09/2016 1355   GLUCOSEU NEGATIVE 08/20/2009 1049   HGBUR NEGATIVE 05/09/2016 [place] 05/09/2016 1355   KETONESUR NEGATIVE 05/09/2016 1355   PROTEINUR NEGATIVE 05/09/2016 1355   UROBILINOGEN 0.2 06/02/2015 1447   NITRITE NEGATIVE 05/09/2016 1355   LEUKOCYTESUR NEGATIVE 05/09/2016 1355    Radiological Exams on Admission: Dg Chest 2 View  Result Date: 09/23/2017 CLINICAL DATA:  Dry cough beginning in January of this year which has become productive. Shortness of breath and chest pain. EXAM: CHEST  2 VIEW COMPARISON:  PA and lateral chest 09/14/2017, 09/23/2017 and 09/03/2015. FINDINGS: The lungs are clear. Heart size is normal. There is no pneumothorax or pleural effusion. No acute bony abnormality. Thoracic spondylosis is noted. IMPRESSION: No acute disease. Electronically Signed   By: Inge Rise M.D.   On: 09/23/2017 12:10   Ct Angio Chest Pe W/cm &/or Wo Cm  Result Date: 09/23/2017 CLINICAL DATA:  Shortness of breath and chest pain. EXAM: CT ANGIOGRAPHY CHEST WITH CONTRAST TECHNIQUE: Multidetector CT imaging of the chest was performed using the standard protocol during bolus administration of intravenous contrast. Multiplanar CT image reconstructions and MIPs were obtained to evaluate the vascular anatomy. CONTRAST:  65mL ISOVUE-370 IOPAMIDOL (ISOVUE-370) INJECTION 76% COMPARISON:  12/25/2015 and chest radiograph from 09/23/2017 FINDINGS: Body habitus reduces diagnostic sensitivity and specificity. Cardiovascular: No filling defect is identified in the pulmonary arterial tree to suggest pulmonary embolus. Mild cardiomegaly. Mediastinum/Nodes: No pathologic adenopathy or additional significant mediastinal abnormality. Lungs/Pleura: Unremarkable Upper Abdomen:  Cholecystectomy. Stable 9 mm right gastric node, likely benign/incidental. 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 Musculoskeletal: Considerable thoracic spondylosis. Review of the MIP images confirms the above findings. IMPRESSION: 1. No filling defect is identified in the pulmonary arterial tree to suggest pulmonary embolus. 2. Mild cardiomegaly. 3. 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 4. Thoracic spondylosis. Electronically Signed   By: Van Clines M.D.   On: 09/23/2017 16:14    EKG: Sinus rhythm, T wave flattening lead III and aVF.  Assessment/Plan Active Problems:   OBESITY   Acute bronchitis   Asthma  Acute bronchitis and Asthma exacerbation- Wheezing, productive cough, SOB.  Mild low-grade temp few hrs after Tylenol, WBC 9.5.  Lactic acid elevated 2.8.  Likely viral etiology.  Flu negative. CTA chest- no PE, no PNA. Iv solumedrol 125, 2g magnesium given in ED. -Albuterol nebulizer scheduled -Guaifenesin-codeine -IV Solu-Medrol 60 mg twice daily -Respiratory virus panel -Will hold off on antibiotics at this time as patient has recently been exposed to azithromycin and Augmentin doxycycline -Follow-up blood cultures ordered for low-grade temp, lactic acidosis - cont home Protonix, likely reflux disease contributing to cough worse when lying down. -Negative sleep study for OSA several years ago, May need repeat sleep study on discharge -Continue home steroid inhaler  Chest pain, tachycardia-substernal, most likely from coughing. CTA chest neg. family history of premature coronary artery disease. -Trend troponins -EKG a.m., doubt need for further workup  as  inpatient  Lactic acidosis-likely from dehydration, viral illness. -Hydrate 1L bolus, then NS 100cc/hr x 12 hrs. -Follow-up cultures -Trend lactic acid  Migraine-  -Cont home medications -Patient specifically requesting Trokendi XR-available on formulary patient can bring home med   DVT prophylaxis: lovenox Code Status: Full  Family Communication: None at bedside Disposition Plan: 2 days Consults called: None Admission status: Obs, tele   Bethena Roys MD [facility] Pager [phone] From 6PM-2AM.  Otherwise please contact night-coverage [email or web address] Password [facility]  09/23/2017, 10:28 PM

## 2017-09-23 NOTE — ED Notes (Signed)
ED TO INPATIENT HANDOFF REPORT  Name/Age/Gender Wanda [name] 52 y.o. female  Code Status Code Status History    Date Active Date Inactive Code Status Order ID Comments User Context   12/31/2014 16:02 01/03/2015 15:13 Full Code [id number]  Frederik Pear, MD Inpatient   04/10/2014 15:27 04/16/2014 15:59 Full Code [id number]  Faythe Ghee, MD Inpatient   03/15/2014 12:44 03/16/2014 03:36 Full Code [id number]  Dereck Ligas, MD HOV   10/28/2012 18:57 10/29/2012 23:47 Full Code [id number]  Sheila Oats, MD Inpatient      Home/SNF/Other Home  Chief Complaint cough/shob  Level of Care/Admitting Diagnosis ED Disposition    ED Disposition Condition Comment   Admit  Hospital Area: [facility] [[id number]]  Level of Care: Telemetry [5]  Admit to tele based on following criteria: Complex arrhythmia (Bradycardia/Tachycardia)  Diagnosis: Acute bronchitis [466.0.ICD-9-CM]  Admitting Physician: Bethena Roys [[id number]]  Attending Physician: Bethena Roys [phone]  PT Class (Do Not Modify): Observation [104]  PT Acc Code (Do Not Modify): Observation [10022]       Medical History Past Medical History:  Diagnosis Date  . Acne   . Anemia    takes iron supplement  . Anxiety   . Asthma   . Chronic bronchitis ([place])   . Depression   . Family history of adverse reaction to anesthesia    pt's mother has hx. of post-op N/V  . Fibromyalgia   . GERD (gastroesophageal reflux disease)   . History of blood transfusion   . History of TIA (transient ischemic attack)    x 2; left-sided weakness  . Hypercholesterolemia    no current med.  . Hypertension    states under control with meds., has been on med. since 06/2011  . Keloid 12/5033   umbilical  . Migraines   . Osteoarthritis   . Osteoarthritis   . Paget disease of bone   . PONV (postoperative nausea and vomiting)    states severe  . Seasonal allergies   . Stroke ([place]) 10/27/2012   TIA   1- 10/29/11-weakness  left side    Allergies Allergies  Allergen Reactions  . Lisinopril Swelling and Other (See Comments)    SWELLING FACE/LIPS/NECK  . Latex Rash  . Oxycodone-Acetaminophen Itching and Rash    Also percocet, Patient states "can tolerate"    IV Location/Drains/Wounds Patient Lines/Drains/Airways Status   Active Line/Drains/Airways    Name:   Placement date:   Placement time:   Site:   Days:   Peripheral IV 09/23/17 Left;Lateral Forearm   09/23/17    1500    Forearm   less than 1          Labs/Imaging Results for orders placed or performed during the hospital encounter of 09/23/17 (from the past 48 hour(s))  D-dimer, quantitative     Status: Abnormal   Collection Time: 09/23/17 11:25 AM  Result Value Ref Range   D-Dimer, Quant 1.29 (H) 0.00 - 0.50 ug/mL-FEU    Comment: (NOTE) At the manufacturer cut-off of 0.50 ug/mL FEU, this assay has been documented to exclude PE with a sensitivity and negative predictive value of 97 to 99%.  At this time, this assay has not been approved by the FDA to exclude DVT/VTE. Results should be correlated with clinical presentation. Performed at [facility], [place] [address], [place], [place] [address]   Basic metabolic panel     Status: Abnormal   Collection Time: 09/23/17 11:26 AM  Result  Value Ref Range   Sodium 144 135 - 145 mmol/L   Potassium 4.0 3.5 - 5.1 mmol/L   Chloride 110 101 - 111 mmol/L   CO2 28 22 - 32 mmol/L   Glucose, Bld 117 (H) 65 - 99 mg/dL   BUN 10 6 - 20 mg/dL   Creatinine, Ser 0.86 0.44 - 1.00 mg/dL   Calcium 9.1 8.9 - 10.3 mg/dL   GFR calc non Af Amer >60 >60 mL/min   GFR calc Af Amer >60 >60 mL/min    Comment: (NOTE) The eGFR has been calculated using the CKD EPI equation. This calculation has not been validated in all clinical situations. eGFR's persistently <60 mL/min signify possible Chronic Kidney Disease.    Anion gap 6 5 - 15    Comment: Performed at [facility], [place]  [address], [place], [place] [address]  CBC     Status: None   Collection Time: 09/23/17 11:26 AM  Result Value Ref Range   WBC 9.5 4.0 - 10.5 K/uL   RBC 4.37 3.87 - 5.11 MIL/uL   Hemoglobin 12.9 12.0 - 15.0 g/dL   HCT 40.4 36.0 - 46.0 %   MCV 92.4 78.0 - 100.0 fL   MCH 29.5 26.0 - 34.0 pg   MCHC 31.9 30.0 - 36.0 g/dL   RDW 14.4 11.5 - 15.5 %   Platelets 163 150 - 400 K/uL    Comment: Performed at [facility], [place] [address] [place], [place] [address]  I-stat troponin, ED     Status: None   Collection Time: 09/23/17 11:40 AM  Result Value Ref Range   Troponin i, poc 0.00 0.00 - 0.08 ng/mL   Comment 3            Comment: Due to the release kinetics of cTnI, a negative result within the first hours of the onset of symptoms does not rule out myocardial infarction with certainty. If myocardial infarction is still suspected, repeat the test at appropriate intervals.   I-Stat beta hCG blood, ED     Status: None   Collection Time: 09/23/17 11:41 AM  Result Value Ref Range   I-stat hCG, quantitative <5.0 <5 mIU/mL   Comment 3            Comment:   GEST. AGE      CONC.  (mIU/mL)   <=1 WEEK        5 - 50     2 WEEKS       50 - 500     3 WEEKS       100 - 10,000     4 WEEKS     1,000 - 30,000        FEMALE AND NON-PREGNANT FEMALE:     LESS THAN 5 mIU/mL   Influenza panel by PCR (type A & B)     Status: None   Collection Time: 09/23/17  6:13 PM  Result Value Ref Range   Influenza A By PCR NEGATIVE NEGATIVE   Influenza B By PCR NEGATIVE NEGATIVE    Comment: (NOTE) The Xpert Xpress Flu assay is intended as an aid in the diagnosis of  influenza and should not be used as a sole basis for treatment.  This  assay is FDA approved for nasopharyngeal swab specimens only. Nasal  washings and aspirates are unacceptable for Xpert Xpress Flu testing. Performed at [facility], [place] [address] [place], [place] [address]   I-Stat  Troponin, ED (not at [facility])      Status: None   Collection Time: 09/23/17  6:57 PM  Result Value Ref Range   Troponin i, poc 0.01 0.00 - 0.08 ng/mL   Comment 3            Comment: Due to the release kinetics of cTnI, a negative result within the first hours of the onset of symptoms does not rule out myocardial infarction with certainty. If myocardial infarction is still suspected, repeat the test at appropriate intervals.   Lactic acid, plasma     Status: Abnormal   Collection Time: 09/23/17  7:35 PM  Result Value Ref Range   Lactic Acid, Venous 2.8 (HH) 0.5 - 1.9 mmol/L    Comment: CRITICAL RESULT CALLED TO, READ BACK BY AND VERIFIED WITH: L.ADKINS RN 09/23/2017 2022 JR Performed at [facility], [place] [address] [place], [place] [address]    Dg Chest 2 View  Result Date: 09/23/2017 CLINICAL DATA:  Dry cough beginning in January of this year which has become productive. Shortness of breath and chest pain. EXAM: CHEST  2 VIEW COMPARISON:  PA and lateral chest 09/14/2017, 09/23/2017 and 09/03/2015. FINDINGS: The lungs are clear. Heart size is normal. There is no pneumothorax or pleural effusion. No acute bony abnormality. Thoracic spondylosis is noted. IMPRESSION: No acute disease. Electronically Signed   By: Inge Rise M.D.   On: 09/23/2017 12:10   Ct Angio Chest Pe W/cm &/or Wo Cm  Result Date: 09/23/2017 CLINICAL DATA:  Shortness of breath and chest pain. EXAM: CT ANGIOGRAPHY CHEST WITH CONTRAST TECHNIQUE: Multidetector CT imaging of the chest was performed using the standard protocol during bolus administration of intravenous contrast. Multiplanar CT image reconstructions and MIPs were obtained to evaluate the vascular anatomy. CONTRAST:  60m ISOVUE-370 IOPAMIDOL (ISOVUE-370) INJECTION 76% COMPARISON:  12/25/2015 and chest radiograph from 09/23/2017 FINDINGS: Body habitus reduces diagnostic sensitivity and specificity. Cardiovascular: No filling defect is identified in the pulmonary arterial  tree to suggest pulmonary embolus. Mild cardiomegaly. Mediastinum/Nodes: No pathologic adenopathy or additional significant mediastinal abnormality. Lungs/Pleura: Unremarkable Upper Abdomen: Cholecystectomy. Stable 9 mm right gastric node, likely benign/incidental. 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 Musculoskeletal: Considerable thoracic spondylosis. Review of the MIP images confirms the above findings. IMPRESSION: 1. No filling defect is identified in the pulmonary arterial tree to suggest pulmonary embolus. 2. Mild cardiomegaly. 3. 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 4. Thoracic spondylosis. Electronically Signed   By: WVan ClinesM.D.   On: 09/23/2017 16:14    Pending Labs Unresulted Labs (From admission, onward)   Start     Ordered   09/24/17 0030  Lactic acid, plasma  Once,   R     09/23/17 2231   09/23/17 2229  Respiratory Panel by PCR  (Respiratory virus panel)  Once,   R     09/23/17 2228   09/23/17 2217  Troponin I (q 6hr x 3)  Now then every 6 hours,   R     09/23/17 2216   09/23/17 2215  Culture, blood (routine x 2)  BLOOD CULTURE X 2,   R     09/23/17 2215  Signed and Held  HIV antibody (Routine Testing)  Once,   R     Signed and Held      Vitals/Pain Today's Vitals   09/23/17 2000 09/23/17 2050 09/23/17 2100 09/23/17 2150  BP: (!) 163/98  (!) 177/90   Pulse: (!) 111  (!) 104   Resp: 17  14   Temp:      TempSrc:      SpO2: 97% 99% 99%   PainSc:    4     Isolation Precautions Droplet precaution  Medications Medications  ipratropium-albuterol (DUONEB) 0.5-2.5 (3) MG/3ML nebulizer solution 3 mL (3 mLs Nebulization Not Given 09/23/17 2050)  sodium  chloride 0.9 % bolus 1,000 mL (not administered)  aspirin EC tablet 325 mg (not administered)  atenolol (TENORMIN) tablet 50 mg (not administered)  busPIRone (BUSPAR) tablet 15 mg (15 mg Oral Given 09/23/17 2258)  loratadine (CLARITIN) tablet 10 mg (not administered)  chlorproMAZINE (THORAZINE) tablet 25 mg (25 mg Oral Given 09/23/17 2259)  mometasone-formoterol (DULERA) 200-5 MCG/ACT inhaler 2 puff (not administered)  montelukast (SINGULAIR) tablet 10 mg (10 mg Oral Given 09/23/17 2310)  pantoprazole (PROTONIX) EC tablet 40 mg (not administered)  rosuvastatin (CRESTOR) tablet 10 mg (not administered)  sertraline (ZOLOFT) tablet 200 mg (200 mg Oral Given 09/23/17 2259)  tiZANidine (ZANAFLEX) tablet 8 mg (8 mg Oral Given 09/23/17 2259)  topiramate (TOPAMAX) tablet 50 mg (50 mg Oral Not Given 09/23/17 2311)  guaiFENesin-codeine 100-10 MG/5ML solution 10 mL (not administered)  albuterol (PROVENTIL) (2.5 MG/3ML) 0.083% nebulizer solution 2.5 mg (not administered)  ferrous sulfate tablet 325 mg (not administered)  methylPREDNISolone sodium succinate (SOLU-MEDROL) 125 mg/2 mL injection 125 mg (125 mg Intravenous Given 09/23/17 1503)  magnesium sulfate IVPB 2 g 50 mL (0 g Intravenous Stopped 09/23/17 1653)  albuterol (PROVENTIL) (2.5 MG/3ML) 0.083% nebulizer solution 5 mg (5 mg Nebulization Given 09/23/17 1410)  ipratropium (ATROVENT) nebulizer solution 0.5 mg (0.5 mg Nebulization Given 09/23/17 1410)  acetaminophen (TYLENOL) tablet 650 mg (650 mg Oral Given 09/23/17 1503)  iopamidol (ISOVUE-370) 76 % injection 100 mL (88 mLs Intravenous Contrast Given 09/23/17 1541)  sodium chloride 0.9 % injection (  Given by Other 09/23/17 1500)  ipratropium-albuterol (DUONEB) 0.5-2.5 (3) MG/3ML nebulizer solution 3 mL (3 mLs Nebulization Given 09/23/17 2050)    Mobility walks

## 2017-09-23 NOTE — ED Provider Notes (Addendum)
Medical screening examination/treatment/procedure(s) were conducted as a shared visit with non-physician practitioner(s) and myself.  I personally evaluated the patient during the encounter.  ED ECG REPORT   Date: 10/04/2017  Rate: 96  Rhythm: normal sinus rhythm  QRS Axis: left  Intervals: normal  ST/T Wave abnormalities: nonspecific ST changes  Conduction Disutrbances:none  Narrative Interpretation:   Old EKG Reviewed: unchanged  I have personally reviewed the EKG tracing and agree with the computerized printout as noted.   See the written copy of this report in the patient's paper medical record.  These results did not interface directly into the electronic medical record and are summarized here.  53 yo F with a chief complaint of shortness of breath.  This is been a progressive issue for her.  Going on for many months.  She has had a cough that she does not able to get rid of.  Has seen 5 different providers for this.  She has been feeling a little bit feverish over the past few days.  Some mild muscle aches but really she is most concerned about the severity of her cough.  Feels that it takes her breath away.  Denies exertional symptoms.  She has a history of fibromyalgia and feels that the coughing exacerbates her fibromyalgia pain.  While in the ED she had worsening changes of her vital signs heart rate went into the 110s.  On my exam the patient is well-appearing and nontoxic.  She is morbidly obese.  She has a bronchospastic cough.  Given a couple more duo nebs without significant improvement.  Will discuss with the hospitalist for possible admission.   Deno Etienne, DO 09/24/17 [place], [place], DO 10/04/17 [phone]

## 2017-09-23 NOTE — ED Triage Notes (Signed)
Pt complains of shortness of breath and chest pain. Pt states she has had shortness of breath for 1 month, has been on abx and prednisone. Pt states she woke up today feeling worse with pressure in her chest.

## 2017-09-23 NOTE — ED Notes (Signed)
Ambulated Pt to bathroom with Pulse Ox. Pt stated she was feeling weak and was holding on to things on the way to restroom. Pt's O2 started at 98 and when Pt coughed while ambulating O2 dropped to 96 and her heartrate remained at 122-125. Pt coughed throughout duration of ambulation. After ambulation Pt stated she felt weak and SOB. After lying down Pt's HR returned to 103 and O2 at 98.

## 2017-09-24 ENCOUNTER — Other Ambulatory Visit: Payer: Self-pay

## 2017-09-24 DIAGNOSIS — E872 Acidosis: Secondary | ICD-10-CM | POA: Diagnosis not present

## 2017-09-24 DIAGNOSIS — J208 Acute bronchitis due to other specified organisms: Secondary | ICD-10-CM | POA: Diagnosis not present

## 2017-09-24 DIAGNOSIS — J45901 Unspecified asthma with (acute) exacerbation: Secondary | ICD-10-CM | POA: Diagnosis not present

## 2017-09-24 LAB — RESPIRATORY PANEL BY PCR
ADENOVIRUS-RVPPCR: NOT DETECTED
Bordetella pertussis: NOT DETECTED
CHLAMYDOPHILA PNEUMONIAE-RVPPCR: NOT DETECTED
CORONAVIRUS HKU1-RVPPCR: NOT DETECTED
CORONAVIRUS NL63-RVPPCR: NOT DETECTED
Coronavirus 229E: NOT DETECTED
Coronavirus OC43: DETECTED — AB
INFLUENZA A-RVPPCR: NOT DETECTED
Influenza B: NOT DETECTED
Metapneumovirus: NOT DETECTED
Mycoplasma pneumoniae: NOT DETECTED
PARAINFLUENZA VIRUS 2-RVPPCR: NOT DETECTED
PARAINFLUENZA VIRUS 3-RVPPCR: NOT DETECTED
PARAINFLUENZA VIRUS 4-RVPPCR: NOT DETECTED
Parainfluenza Virus 1: NOT DETECTED
RHINOVIRUS / ENTEROVIRUS - RVPPCR: DETECTED — AB
Respiratory Syncytial Virus: NOT DETECTED

## 2017-09-24 LAB — TROPONIN I: Troponin I: <0.03 ng/mL (ref <0.03)

## 2017-09-24 LAB — HIV ANTIBODY (ROUTINE TESTING W REFLEX): HIV SCREEN 4TH GENERATION: NONREACTIVE

## 2017-09-24 LAB — LACTIC ACID, PLASMA: Lactic Acid, Venous: 4.6 mmol/L (ref 0.5–1.9)

## 2017-09-24 MED ORDER — CHLORHEXIDINE GLUCONATE 0.12 % MT SOLN
15.0000 mL | Freq: Two times a day (BID) | OROMUCOSAL | Status: DC
  Administered 2017-09-24 – 2017-09-27 (×5): 15 mL via OROMUCOSAL
  Filled 2017-09-24 (×6): qty 15

## 2017-09-24 MED ORDER — ONDANSETRON HCL 4 MG/2ML IJ SOLN
4.0000 mg | Freq: Four times a day (QID) | INTRAMUSCULAR | Status: DC | PRN
Start: 2017-09-24 — End: 2017-09-28

## 2017-09-24 MED ORDER — SODIUM CHLORIDE 0.9 % IV SOLN: INTRAVENOUS | Status: AC

## 2017-09-24 MED ORDER — ENOXAPARIN SODIUM 40 MG/0.4ML ~~LOC~~ SOLN
40.0000 mg | Freq: Every day | SUBCUTANEOUS | Status: DC
  Administered 2017-09-24 – 2017-09-27 (×4): 40 mg via SUBCUTANEOUS
  Filled 2017-09-24 (×4): qty 0.4

## 2017-09-24 MED ORDER — TOPIRAMATE ER 100 MG PO CAP24
100.0000 mg | ORAL_CAPSULE | Freq: Every day | ORAL | Status: DC
  Administered 2017-09-24: 100 mg via ORAL

## 2017-09-24 MED ORDER — SODIUM CHLORIDE 0.9 % IV SOLN
INTRAVENOUS | Status: AC
  Administered 2017-09-24: 05:00:00 via INTRAVENOUS

## 2017-09-24 MED ORDER — ORAL CARE MOUTH RINSE
15.0000 mL | Freq: Two times a day (BID) | OROMUCOSAL | Status: DC
  Administered 2017-09-24 – 2017-09-26 (×4): 15 mL via OROMUCOSAL

## 2017-09-24 MED ORDER — ONDANSETRON HCL 4 MG PO TABS: 4.0000 mg | ORAL_TABLET | Freq: Four times a day (QID) | ORAL | Status: DC | PRN

## 2017-09-24 MED ORDER — SODIUM CHLORIDE 0.9 % IV BOLUS (SEPSIS)
1000.0000 mL | Freq: Once | INTRAVENOUS | Status: AC
  Administered 2017-09-24: 1000 mL via INTRAVENOUS

## 2017-09-24 MED ORDER — METHYLPREDNISOLONE SODIUM SUCC 125 MG IJ SOLR
60.0000 mg | Freq: Two times a day (BID) | INTRAMUSCULAR | Status: DC
  Administered 2017-09-24 – 2017-09-28 (×9): 60 mg via INTRAVENOUS
  Filled 2017-09-24 (×9): qty 2

## 2017-09-24 NOTE — Progress Notes (Signed)
CRITICAL VALUE ALERT  Critical Value:  Lactic acid 4.6  Date & Time Notied:  09/24/2017 0137  Provider Notified: Silas [place]  Orders Received/Actions taken: 1L bolus NS @ 543ml/hr

## 2017-09-24 NOTE — Progress Notes (Signed)
PROGRESS NOTE  Wanda [name] LPF:[id number] DOB: February 04, 1966 DOA: 09/23/2017 PCP: Aretta Nip, MD  HPI/Recap of past 24 hours:  Coughing with intermittent wheezing, no fever, no edema, no chest pain, no hypoxia at rest Family at bedside  Assessment/Plan: Active Problems:   OBESITY   Acute bronchitis   Asthma  Acute bronchitis and asthma exacerbation -Chest x-ray no acute findings, CTA negative for PE -Flu negative, respiratory viral panel positive for coronavirus and rhinovirus -she Reported finished steroids taper x2 , once in January, once a few days ago in [place]  -sHe has sinus tachycardia, significant lactic acidosis on presentation, she is improving on nebulizer, steroid,  -Consider taper steroid if she continue to improve  Hypertension :continue atenolol, hold hydrochlorothiazide  History of hemiplegic migraine Currently no headache, no weakness Continue home meds  Obesity Body mass index is 38.95 kg/m.  Code Status: full  Family Communication: patient and family at bedside  Disposition Plan: home in am if continue to improve   Consultants:  none  Procedures:  none  Antibiotics:  none   Objective: BP 127/73   Pulse (!) 103   Temp 97.7 F (36.5 C) (Oral)   Resp 17   Ht 5\' 6"  (1.676 m)   Wt 109.5 kg (241 lb 4.8 oz)   LMP 10/14/2002 (Within Weeks)   SpO2 98%   BMI 38.95 kg/m   Intake/Output Summary (Last 24 hours) at 09/24/2017 1246 Last data filed at 09/24/2017 5329 Gross per 24 hour  Intake 2790 ml  Output -  Net 2790 ml   Filed Weights   09/24/17 0033  Weight: 109.5 kg (241 lb 4.8 oz)    Exam: Patient is examined daily including today on 09/24/2017, exams remain the same as of yesterday except that has changed    General:  NAD  Cardiovascular: RRR  Respiratory: intermittent wheezing  Abdomen: Soft/ND/NT, positive BS  Musculoskeletal: No Edema  Neuro: alert, oriented   Data Reviewed: Basic Metabolic  Panel: Recent Labs  Lab 09/23/17 1126  NA 144  K 4.0  CL 110  CO2 28  GLUCOSE 117*  BUN 10  CREATININE 0.86  CALCIUM 9.1   Liver Function Tests: No results for input(s): AST, ALT, ALKPHOS, BILITOT, PROT, ALBUMIN in the last 168 hours. No results for input(s): LIPASE, AMYLASE in the last 168 hours. No results for input(s): AMMONIA in the last 168 hours. CBC: Recent Labs  Lab 09/23/17 1126  WBC 9.5  HGB 12.9  HCT 40.4  MCV 92.4  PLT 163   Cardiac Enzymes:   Recent Labs  Lab 09/24/17 0025 09/24/17 0635  TROPONINI <0.03 <0.03   BNP (last 3 results) No results for input(s): BNP in the last 8760 hours.  ProBNP (last 3 results) No results for input(s): PROBNP in the last 8760 hours.  CBG: No results for input(s): GLUCAP in the last 168 hours.  Recent Results (from the past 240 hour(s))  Respiratory Panel by PCR     Status: Abnormal   Collection Time: 09/24/17  3:02 AM  Result Value Ref Range Status   Adenovirus NOT DETECTED NOT DETECTED Final   Coronavirus 229E NOT DETECTED NOT DETECTED Final   Coronavirus HKU1 NOT DETECTED NOT DETECTED Final   Coronavirus NL63 NOT DETECTED NOT DETECTED Final   Coronavirus OC43 DETECTED (A) NOT DETECTED Final   Metapneumovirus NOT DETECTED NOT DETECTED Final   Rhinovirus / Enterovirus DETECTED (A) NOT DETECTED Final   Influenza A NOT DETECTED NOT DETECTED Final  Influenza B NOT DETECTED NOT DETECTED Final   Parainfluenza Virus 1 NOT DETECTED NOT DETECTED Final   Parainfluenza Virus 2 NOT DETECTED NOT DETECTED Final   Parainfluenza Virus 3 NOT DETECTED NOT DETECTED Final   Parainfluenza Virus 4 NOT DETECTED NOT DETECTED Final   Respiratory Syncytial Virus NOT DETECTED NOT DETECTED Final   Bordetella pertussis NOT DETECTED NOT DETECTED Final   Chlamydophila pneumoniae NOT DETECTED NOT DETECTED Final   Mycoplasma pneumoniae NOT DETECTED NOT DETECTED Final     Studies: Ct Angio Chest Pe W/cm &/or Wo Cm  Result Date:  09/23/2017 CLINICAL DATA:  Shortness of breath and chest pain. EXAM: CT ANGIOGRAPHY CHEST WITH CONTRAST TECHNIQUE: Multidetector CT imaging of the chest was performed using the standard protocol during bolus administration of intravenous contrast. Multiplanar CT image reconstructions and MIPs were obtained to evaluate the vascular anatomy. CONTRAST:  71mL ISOVUE-370 IOPAMIDOL (ISOVUE-370) INJECTION 76% COMPARISON:  12/25/2015 and chest radiograph from 09/23/2017 FINDINGS: Body habitus reduces diagnostic sensitivity and specificity. Cardiovascular: No filling defect is identified in the pulmonary arterial tree to suggest pulmonary embolus. Mild cardiomegaly. Mediastinum/Nodes: No pathologic adenopathy or additional significant mediastinal abnormality. Lungs/Pleura: Unremarkable Upper Abdomen: Cholecystectomy. Stable 9 mm right gastric node, likely benign/incidental. 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 Musculoskeletal: Considerable thoracic spondylosis. Review of the MIP images confirms the above findings. IMPRESSION: 1. No filling defect is identified in the pulmonary arterial tree to suggest pulmonary embolus. 2. Mild cardiomegaly. 3. 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 4. Thoracic spondylosis. Electronically Signed   By: Van Clines M.D.   On: 09/23/2017 16:14    Scheduled Meds: . albuterol  2.5 mg Nebulization BID  . aspirin EC  325 mg Oral Daily  . atenolol  50 mg Oral Daily  . busPIRone  15 mg Oral BID  . chlorhexidine  15 mL Mouth Rinse BID  . chlorproMAZINE  25 mg Oral TID  . enoxaparin (LOVENOX) injection  40 mg Subcutaneous QHS  . ferrous sulfate  325 mg Oral Q breakfast  .  ipratropium-albuterol  3 mL Nebulization Once  . loratadine  10 mg Oral Daily  . mouth rinse  15 mL Mouth Rinse q12n4p  . methylPREDNISolone (SOLU-MEDROL) injection  60 mg Intravenous Q12H  . mometasone-formoterol  2 puff Inhalation BID  . montelukast  10 mg Oral QHS  . pantoprazole  40 mg Oral BID AC  . rosuvastatin  10 mg Oral Daily  . sertraline  200 mg Oral QHS  . tiZANidine  8 mg Oral QHS  . topiramate  50 mg Oral BID    Continuous Infusions: . sodium chloride 100 mL/hr at 09/24/17 0518     Time spent: 27mins I have personally reviewed and interpreted on  09/24/2017 daily labs, tele strips, imagings as discussed above under date review session and assessment and plans.  I reviewed all nursing notes,  vitals, pertinent old records  I have discussed plan of care as described above with RN , patient and family on 09/24/2017   Florencia Reasons MD, PhD  [facility] Pager [phone]. If 7PM-7AM, please contact night-coverage at [email or web address], password [facility] 09/24/2017, 12:46 PM  LOS: 0 days

## 2017-09-24 NOTE — Care Management Obs Status (Signed)
[place] NOTIFICATION   Patient Details  Name: Wanda [name] MRN: [id number] Date of Birth: October 26, 1965   Medicare Observation Status Notification Given:  Yes    MahabirJuliann Pulse, RN 09/24/2017, 4:06 PM

## 2017-09-25 DIAGNOSIS — G43409 Hemiplegic migraine, not intractable, without status migrainosus: Secondary | ICD-10-CM | POA: Diagnosis present

## 2017-09-25 DIAGNOSIS — K219 Gastro-esophageal reflux disease without esophagitis: Secondary | ICD-10-CM | POA: Diagnosis present

## 2017-09-25 DIAGNOSIS — E669 Obesity, unspecified: Secondary | ICD-10-CM | POA: Diagnosis present

## 2017-09-25 DIAGNOSIS — J42 Unspecified chronic bronchitis: Secondary | ICD-10-CM | POA: Diagnosis present

## 2017-09-25 DIAGNOSIS — J4541 Moderate persistent asthma with (acute) exacerbation: Secondary | ICD-10-CM | POA: Diagnosis not present

## 2017-09-25 DIAGNOSIS — R0789 Other chest pain: Secondary | ICD-10-CM | POA: Diagnosis present

## 2017-09-25 DIAGNOSIS — E872 Acidosis: Secondary | ICD-10-CM | POA: Diagnosis present

## 2017-09-25 DIAGNOSIS — J208 Acute bronchitis due to other specified organisms: Secondary | ICD-10-CM | POA: Diagnosis not present

## 2017-09-25 DIAGNOSIS — R000 Tachycardia, unspecified: Secondary | ICD-10-CM | POA: Diagnosis present

## 2017-09-25 DIAGNOSIS — F419 Anxiety disorder, unspecified: Secondary | ICD-10-CM | POA: Diagnosis present

## 2017-09-25 DIAGNOSIS — I69854 Hemiplegia and hemiparesis following other cerebrovascular disease affecting left non-dominant side: Secondary | ICD-10-CM | POA: Diagnosis not present

## 2017-09-25 DIAGNOSIS — B9729 Other coronavirus as the cause of diseases classified elsewhere: Secondary | ICD-10-CM | POA: Diagnosis present

## 2017-09-25 DIAGNOSIS — M797 Fibromyalgia: Secondary | ICD-10-CM | POA: Diagnosis present

## 2017-09-25 DIAGNOSIS — M25511 Pain in right shoulder: Secondary | ICD-10-CM | POA: Diagnosis present

## 2017-09-25 DIAGNOSIS — R0602 Shortness of breath: Secondary | ICD-10-CM | POA: Diagnosis not present

## 2017-09-25 DIAGNOSIS — M1990 Unspecified osteoarthritis, unspecified site: Secondary | ICD-10-CM | POA: Diagnosis present

## 2017-09-25 DIAGNOSIS — J45901 Unspecified asthma with (acute) exacerbation: Secondary | ICD-10-CM | POA: Diagnosis present

## 2017-09-25 DIAGNOSIS — E860 Dehydration: Secondary | ICD-10-CM | POA: Diagnosis present

## 2017-09-25 DIAGNOSIS — R042 Hemoptysis: Secondary | ICD-10-CM | POA: Diagnosis present

## 2017-09-25 DIAGNOSIS — F329 Major depressive disorder, single episode, unspecified: Secondary | ICD-10-CM | POA: Diagnosis present

## 2017-09-25 DIAGNOSIS — I10 Essential (primary) hypertension: Secondary | ICD-10-CM | POA: Diagnosis present

## 2017-09-25 DIAGNOSIS — E7800 Pure hypercholesterolemia, unspecified: Secondary | ICD-10-CM | POA: Diagnosis present

## 2017-09-25 DIAGNOSIS — J209 Acute bronchitis, unspecified: Secondary | ICD-10-CM | POA: Diagnosis present

## 2017-09-25 DIAGNOSIS — D649 Anemia, unspecified: Secondary | ICD-10-CM | POA: Diagnosis present

## 2017-09-25 DIAGNOSIS — M889 Osteitis deformans of unspecified bone: Secondary | ICD-10-CM | POA: Diagnosis present

## 2017-09-25 DIAGNOSIS — R791 Abnormal coagulation profile: Secondary | ICD-10-CM | POA: Diagnosis present

## 2017-09-25 DIAGNOSIS — B9789 Other viral agents as the cause of diseases classified elsewhere: Secondary | ICD-10-CM | POA: Diagnosis present

## 2017-09-25 LAB — CBC WITH DIFFERENTIAL/PLATELET
BASOS ABS: 0 10*3/uL (ref 0.0–0.1)
Basophils Relative: 0 %
Eosinophils Absolute: 0 10*3/uL (ref 0.0–0.7)
Eosinophils Relative: 0 %
HEMATOCRIT: 36.2 % (ref 36.0–46.0)
HEMOGLOBIN: 11.7 g/dL — AB (ref 12.0–15.0)
LYMPHS PCT: 10 %
Lymphs Abs: 1 10*3/uL (ref 0.7–4.0)
MCH: 28.9 pg (ref 26.0–34.0)
MCHC: 32.3 g/dL (ref 30.0–36.0)
MCV: 89.4 fL (ref 78.0–100.0)
Monocytes Absolute: 0.5 10*3/uL (ref 0.1–1.0)
Monocytes Relative: 5 %
NEUTROS ABS: 8.4 10*3/uL — AB (ref 1.7–7.7)
NEUTROS PCT: 85 %
Platelets: 159 10*3/uL (ref 150–400)
RBC: 4.05 MIL/uL (ref 3.87–5.11)
RDW: 14.4 % (ref 11.5–15.5)
WBC: 9.9 10*3/uL (ref 4.0–10.5)

## 2017-09-25 LAB — BASIC METABOLIC PANEL
ANION GAP: 7 (ref 5–15)
BUN: 10 mg/dL (ref 6–20)
CHLORIDE: 112 mmol/L — AB (ref 101–111)
CO2: 23 mmol/L (ref 22–32)
Calcium: 8.8 mg/dL — ABNORMAL LOW (ref 8.9–10.3)
Creatinine, Ser: 0.79 mg/dL (ref 0.44–1.00)
GFR calc Af Amer: >60 mL/min (ref >60)
GFR calc non Af Amer: >60 mL/min (ref >60)
GLUCOSE: 251 mg/dL — AB (ref 65–99)
POTASSIUM: 4 mmol/L (ref 3.5–5.1)
Sodium: 142 mmol/L (ref 135–145)

## 2017-09-25 LAB — MAGNESIUM: Magnesium: 2.2 mg/dL (ref 1.7–2.4)

## 2017-09-25 LAB — LACTIC ACID, PLASMA: Lactic Acid, Venous: 2.2 mmol/L (ref 0.5–1.9)

## 2017-09-25 MED ORDER — BENZONATATE 100 MG PO CAPS
200.0000 mg | ORAL_CAPSULE | Freq: Three times a day (TID) | ORAL | Status: DC
  Administered 2017-09-25 – 2017-09-26 (×3): 200 mg via ORAL
  Filled 2017-09-25 (×3): qty 2

## 2017-09-25 MED ORDER — TOPIRAMATE ER 100 MG PO CAP24
100.0000 mg | ORAL_CAPSULE | Freq: Every day | ORAL | Status: DC
  Administered 2017-09-25 – 2017-09-27 (×3): 100 mg via ORAL

## 2017-09-25 MED ORDER — SODIUM CHLORIDE 0.9 % IV BOLUS (SEPSIS)
500.0000 mL | Freq: Once | INTRAVENOUS | Status: AC
  Administered 2017-09-25: 500 mL via INTRAVENOUS

## 2017-09-25 NOTE — Progress Notes (Signed)
CRITICAL VALUE ALERT  Critical Value:  Lactic acid 2.2  Date & Time Notied:  09/25/2017 0520  Provider Notified: Silas [place]  Orders Received/Actions taken: order obtained for 546ml normal saline bolus

## 2017-09-25 NOTE — Progress Notes (Signed)
PROGRESS NOTE  Wanda [name] [id number] DOB: [birth year] DOA: 09/23/2017 PCP: Aretta Nip, MD  HPI/Subjective: Pt presented with cough and dyspnea, now overall improving but still with fatigue and ongoing dyspnea.   Assessment/Plan: Acute bronchitis and asthma exacerbation -Chest x-ray no acute findings, CTA negative for PE -Flu negative, respiratory viral panel positive for coronavirus and rhinovirus -she Reported finished steroids taper x2 , once in January, once a few days ago in [place]  -still on solumedrol IV and will kep on current regimen as pt still wheezing   Hypertension - cont Atenolol  - reasonable inpatient control    History of hemiplegic migraine - no headaches this am  Obesity - Body mass index is 38.95 kg/m.  Code Status: full Family Communication: pt at bedside  Disposition Plan: home in 1-2 days   Consultants:  None   Procedures:  None   Antibiotics:  None    Objective: BP (!) 126/59 (BP Location: Left Arm)   Pulse 74   Temp 98.2 F (36.8 C) (Oral)   Resp 18   Ht 5\' 6"  (1.676 m)   Wt 109.5 kg (241 lb 4.8 oz)   LMP 10/14/2002 (Within Weeks)   SpO2 97%   BMI 38.95 kg/m   Intake/Output Summary (Last 24 hours) at 09/25/2017 1531 Last data filed at 09/25/2017 1000 Gross per 24 hour  Intake 1100 ml  Output -  Net 1100 ml   Filed Weights   09/24/17 0033  Weight: 109.5 kg (241 lb 4.8 oz)   Physical Exam  Constitutional: Appears calm, NAD CVS: RRR, S1/S2 +, no murmurs, no gallops, no carotid bruit.  Pulmonary: exp wheezing still noted, rhonchi at bases  Abdominal: Soft. BS +,  no distension, tenderness, rebound or guarding.  Musculoskeletal: Normal range of motion. No edema and no tenderness.   Data Reviewed: Basic Metabolic Panel: Recent Labs  Lab 09/23/17 1126 09/25/17 0400  NA 144 142  K 4.0 4.0  CL 110 112*  CO2 28 23  GLUCOSE 117* 251*  BUN 10 10  CREATININE 0.86 0.79  CALCIUM 9.1 8.8*  MG  --  2.2    CBC: Recent Labs  Lab 09/23/17 1126 09/25/17 0400  WBC 9.5 9.9  NEUTROABS  --  8.4*  HGB 12.9 11.7*  HCT 40.4 36.2  MCV 92.4 89.4  PLT 163 159   Cardiac Enzymes:   Recent Labs  Lab 09/24/17 0025 09/24/17 0635 09/24/17 1159  TROPONINI <0.03 <0.03 <0.03    Recent Results (from the past 240 hour(s))  Culture, blood (routine x 2)     Status: None (Preliminary result)   Collection Time: 09/24/17 12:25 AM  Result Value Ref Range Status   Specimen Description   Final    BLOOD LEFT ANTECUBITAL Performed at [facility], [place] [address] [place], [place] [address]    Special Requests   Final    BOTTLES DRAWN AEROBIC AND ANAEROBIC Blood Culture adequate volume Performed at [place] [address] [place], [place] [address]    Culture   Final    NO GROWTH 1 DAY Performed at [facility], [place] [address], [place],  [address]    Report Status PENDING  Incomplete  Culture, blood (routine x 2)     Status: None (Preliminary result)   Collection Time: 09/24/17 12:35 AM  Result Value Ref Range Status   Specimen Description   Final    BLOOD RIGHT HAND Performed at [facility]  [place] [address] [place], [place] [address]    Special Requests   Final    BOTTLES DRAWN AEROBIC AND ANAEROBIC Blood Culture adequate volume Performed at [place] [address], [place], [place] [address]    Culture   Final    NO GROWTH 1 DAY Performed at [facility], [place] [address] [place], [place] [address]    Report Status PENDING  Incomplete  Respiratory Panel by PCR     Status: Abnormal   Collection Time: 09/24/17  3:02 AM  Result Value Ref Range Status   Adenovirus NOT DETECTED NOT DETECTED Final   Coronavirus 229E NOT DETECTED NOT DETECTED Final   Coronavirus HKU1 NOT DETECTED NOT DETECTED Final   Coronavirus NL63 NOT DETECTED NOT DETECTED Final   Coronavirus OC43 DETECTED (A) NOT  DETECTED Final   Metapneumovirus NOT DETECTED NOT DETECTED Final   Rhinovirus / Enterovirus DETECTED (A) NOT DETECTED Final   Influenza A NOT DETECTED NOT DETECTED Final   Influenza B NOT DETECTED NOT DETECTED Final   Parainfluenza Virus 1 NOT DETECTED NOT DETECTED Final   Parainfluenza Virus 2 NOT DETECTED NOT DETECTED Final   Parainfluenza Virus 3 NOT DETECTED NOT DETECTED Final   Parainfluenza Virus 4 NOT DETECTED NOT DETECTED Final   Respiratory Syncytial Virus NOT DETECTED NOT DETECTED Final   Bordetella pertussis NOT DETECTED NOT DETECTED Final   Chlamydophila pneumoniae NOT DETECTED NOT DETECTED Final   Mycoplasma pneumoniae NOT DETECTED NOT DETECTED Final    Studies: No results found.  Scheduled Meds: . albuterol  2.5 mg Nebulization BID  . aspirin EC  325 mg Oral Daily  . atenolol  50 mg Oral Daily  . busPIRone  15 mg Oral BID  . chlorhexidine  15 mL Mouth Rinse BID  . chlorproMAZINE  25 mg Oral TID  . enoxaparin (LOVENOX) injection  40 mg Subcutaneous QHS  . ferrous sulfate  325 mg Oral Q breakfast  . ipratropium-albuterol  3 mL Nebulization Once  . loratadine  10 mg Oral Daily  . mouth rinse  15 mL Mouth Rinse q12n4p  . methylPREDNISolone (SOLU-MEDROL) injection  60 mg Intravenous Q12H  . mometasone-formoterol  2 puff Inhalation BID  . montelukast  10 mg Oral QHS  . pantoprazole  40 mg Oral BID AC  . rosuvastatin  10 mg Oral Daily  . sertraline  200 mg Oral QHS  . tiZANidine  8 mg Oral QHS  . Topiramate ER  100 mg Oral QHS   Time spent:  25 minutes with > 50% of time discussing current diagnostic test results, clinical impression and plan of care.  Faye Ramsay MD [facility] Pager [phone] If 7PM-7AM, please contact night-coverage at [email or web address], password [facility]  09/25/2017, 3:31 PM  LOS: 0 days

## 2017-09-26 LAB — BASIC METABOLIC PANEL
ANION GAP: 7 (ref 5–15)
BUN: 12 mg/dL (ref 6–20)
CALCIUM: 8.8 mg/dL — AB (ref 8.9–10.3)
CO2: 24 mmol/L (ref 22–32)
Chloride: 111 mmol/L (ref 101–111)
Creatinine, Ser: 0.84 mg/dL (ref 0.44–1.00)
Glucose, Bld: 170 mg/dL — ABNORMAL HIGH (ref 65–99)
Potassium: 4 mmol/L (ref 3.5–5.1)
SODIUM: 142 mmol/L (ref 135–145)

## 2017-09-26 LAB — CBC
HCT: 38.8 % (ref 36.0–46.0)
Hemoglobin: 12 g/dL (ref 12.0–15.0)
MCH: 28.8 pg (ref 26.0–34.0)
MCHC: 30.9 g/dL (ref 30.0–36.0)
MCV: 93.3 fL (ref 78.0–100.0)
PLATELETS: 180 10*3/uL (ref 150–400)
RBC: 4.16 MIL/uL (ref 3.87–5.11)
RDW: 14.7 % (ref 11.5–15.5)
WBC: 10.8 10*3/uL — AB (ref 4.0–10.5)

## 2017-09-26 MED ORDER — NON FORMULARY: 2.0000 | Freq: Every morning | Status: DC

## 2017-09-26 MED ORDER — GUAIFENESIN ER 600 MG PO TB12
600.0000 mg | ORAL_TABLET | Freq: Two times a day (BID) | ORAL | Status: DC
  Administered 2017-09-26 – 2017-09-28 (×5): 600 mg via ORAL
  Filled 2017-09-26 (×5): qty 1

## 2017-09-26 MED ORDER — BENZONATATE 100 MG PO CAPS
200.0000 mg | ORAL_CAPSULE | Freq: Four times a day (QID) | ORAL | Status: DC
  Administered 2017-09-26 – 2017-09-28 (×8): 200 mg via ORAL
  Filled 2017-09-26 (×8): qty 2

## 2017-09-26 MED ORDER — HYDROCOD POLST-CPM POLST ER 10-8 MG/5ML PO SUER: 5.0000 mL | Freq: Every evening | ORAL | Status: DC | PRN

## 2017-09-26 MED ORDER — MOMETASONE FUROATE 50 MCG/ACT NA SUSP
2.0000 | Freq: Every morning | NASAL | Status: DC
  Administered 2017-09-26 – 2017-09-28 (×3): 2 via NASAL

## 2017-09-26 NOTE — Plan of Care (Signed)
  Clinical Measurements: Respiratory complications will improve 09/26/2017 2113 - Progressing by Ashley Murrain, RN

## 2017-09-26 NOTE — Plan of Care (Signed)
  Progressing Health Behavior/Discharge Planning: Ability to manage health-related needs will improve 09/26/2017 1734 - Progressing by Starsky Nanna, Scarlett Presto, RN Clinical Measurements: Ability to maintain clinical measurements within normal limits will improve 09/26/2017 1734 - Progressing by Airyn Ellzey W, RN Will remain free from infection 09/26/2017 1734 - Progressing by Edwing Figley, Scarlett Presto, RN Respiratory complications will improve 09/26/2017 1734 - Progressing by Daimian [place], Scarlett Presto, RN

## 2017-09-26 NOTE — Progress Notes (Signed)
PROGRESS NOTE  Wanda [name] [id number] DOB: July 14, 1966 DOA: 09/23/2017 PCP: Aretta Nip, MD  HPI/Subjective: Pt presented with cough and dyspnea, now overall improving but still with diffuse rhonchi and wheezing.   Assessment/Plan: Acute bronchitis and asthma exacerbation -Chest x-ray no acute findings, CTA negative for PE -Flu negative, respiratory viral panel positive for coronavirus and rhinovirus -she Reported finished steroids taper x2 , once in January, once a few days ago in [place]  -pt still with diffuse rhonchi and wheezing, will keep on IV solumedrol for now with no plan to taper down yet until pt clinically improving  -added antitussives to see if that will help   Hypertension - cont Atenolol  - reasonable inpatient control   History of hemiplegic migraine - no headaches this am  Obesity - Body mass index is 38.95 kg/m.  Code Status: full DVT prophylaxis: Lovenox SQ Family Communication: pt at bedside  Disposition Plan: suspect in 1-2 days when able to taper off IV solumedrol   Consultants:  None   Procedures:  None   Antibiotics:  None    Objective: BP 135/69 (BP Location: Right Arm)   Pulse 75   Temp 97.7 F (36.5 C) (Oral)   Resp 18   Ht 5\' 6"  (1.676 m)   Wt 109.5 kg (241 lb 4.8 oz)   LMP 10/14/2002 (Within Weeks)   SpO2 98%   BMI 38.95 kg/m   Intake/Output Summary (Last 24 hours) at 09/26/2017 1438 Last data filed at 09/26/2017 0830 Gross per 24 hour  Intake 480 ml  Output -  Net 480 ml   Filed Weights   09/24/17 0033  Weight: 109.5 kg (241 lb 4.8 oz)   Physical Exam  Constitutional: Appears calm, NAD CVS: RRR, S1/S2 +, no murmurs, no gallops, no carotid bruit.  Pulmonary: diffuse rhonchi and exp wheezing noted Abdominal: Soft. BS +,  no distension, tenderness, rebound or guarding.  Neuro: Alert. Normal reflexes, muscle tone coordination. No cranial nerve deficit.  Data Reviewed: Basic Metabolic  Panel: Recent Labs  Lab 09/23/17 1126 09/25/17 0400 09/26/17 0438  NA 144 142 142  K 4.0 4.0 4.0  CL 110 112* 111  CO2 28 23 24   GLUCOSE 117* 251* 170*  BUN 10 10 12   CREATININE 0.86 0.79 0.84  CALCIUM 9.1 8.8* 8.8*  MG  --  2.2  --    CBC: Recent Labs  Lab 09/23/17 1126 09/25/17 0400 09/26/17 0438  WBC 9.5 9.9 10.8*  NEUTROABS  --  8.4*  --   HGB 12.9 11.7* 12.0  HCT 40.4 36.2 38.8  MCV 92.4 89.4 93.3  PLT 163 159 180   Cardiac Enzymes:   Recent Labs  Lab 09/24/17 0025 09/24/17 0635 09/24/17 1159  TROPONINI <0.03 <0.03 <0.03    Recent Results (from the past 240 hour(s))  Culture, blood (routine x 2)     Status: None (Preliminary result)   Collection Time: 09/24/17 12:25 AM  Result Value Ref Range Status   Specimen Description   Final    BLOOD LEFT ANTECUBITAL Performed at [facility], [place] [address], [place], [place] [address]    Special Requests   Final    BOTTLES DRAWN AEROBIC AND ANAEROBIC Blood Culture adequate volume Performed at [place] [address], [place], [place] [address]    Culture   Final    NO GROWTH 1 DAY Performed at [facility], [place] [address] [place], [place] [address]  Report Status PENDING  Incomplete  Culture, blood (routine x 2)     Status: None (Preliminary result)   Collection Time: 09/24/17 12:35 AM  Result Value Ref Range Status   Specimen Description   Final    BLOOD RIGHT HAND Performed at [facility] [address], [place], [place] [address]    Special Requests   Final    BOTTLES DRAWN AEROBIC AND ANAEROBIC Blood Culture adequate volume Performed at [place] [address], [place], [place] [address]    Culture   Final    NO GROWTH 1 DAY Performed at [facility], [place] [address], [place], [place] [address]    Report Status PENDING  Incomplete  Respiratory Panel by PCR     Status: Abnormal   Collection Time:  09/24/17  3:02 AM  Result Value Ref Range Status   Adenovirus NOT DETECTED NOT DETECTED Final   Coronavirus 229E NOT DETECTED NOT DETECTED Final   Coronavirus HKU1 NOT DETECTED NOT DETECTED Final   Coronavirus NL63 NOT DETECTED NOT DETECTED Final   Coronavirus OC43 DETECTED (A) NOT DETECTED Final   Metapneumovirus NOT DETECTED NOT DETECTED Final   Rhinovirus / Enterovirus DETECTED (A) NOT DETECTED Final   Influenza A NOT DETECTED NOT DETECTED Final   Influenza B NOT DETECTED NOT DETECTED Final   Parainfluenza Virus 1 NOT DETECTED NOT DETECTED Final   Parainfluenza Virus 2 NOT DETECTED NOT DETECTED Final   Parainfluenza Virus 3 NOT DETECTED NOT DETECTED Final   Parainfluenza Virus 4 NOT DETECTED NOT DETECTED Final   Respiratory Syncytial Virus NOT DETECTED NOT DETECTED Final   Bordetella pertussis NOT DETECTED NOT DETECTED Final   Chlamydophila pneumoniae NOT DETECTED NOT DETECTED Final   Mycoplasma pneumoniae NOT DETECTED NOT DETECTED Final    Studies: No results found.  Scheduled Meds: . albuterol  2.5 mg Nebulization BID  . aspirin EC  325 mg Oral Daily  . atenolol  50 mg Oral Daily  . benzonatate  200 mg Oral QID  . busPIRone  15 mg Oral BID  . chlorhexidine  15 mL Mouth Rinse BID  . chlorproMAZINE  25 mg Oral TID  . enoxaparin (LOVENOX) injection  40 mg Subcutaneous QHS  . ferrous sulfate  325 mg Oral Q breakfast  . guaiFENesin  600 mg Oral BID  . ipratropium-albuterol  3 mL Nebulization Once  . loratadine  10 mg Oral Daily  . mouth rinse  15 mL Mouth Rinse q12n4p  . methylPREDNISolone (SOLU-MEDROL) injection  60 mg Intravenous Q12H  . mometasone  2 spray Each Nare q morning - 10a  . montelukast  10 mg Oral QHS  . pantoprazole  40 mg Oral BID AC  . rosuvastatin  10 mg Oral Daily  . sertraline  200 mg Oral QHS  . tiZANidine  8 mg Oral QHS  . Topiramate ER  100 mg Oral QHS   Time spent:  25 minutes with > 50% of time discussing current diagnostic test results,  clinical impression and plan of care.  Faye Ramsay MD [facility] Pager [phone] If 7PM-7AM, please contact night-coverage at [email or web address], password [facility]  09/26/2017, 2:38 PM  LOS: 1 day

## 2017-09-27 DIAGNOSIS — J45901 Unspecified asthma with (acute) exacerbation: Secondary | ICD-10-CM

## 2017-09-27 DIAGNOSIS — J208 Acute bronchitis due to other specified organisms: Secondary | ICD-10-CM

## 2017-09-27 DIAGNOSIS — R0602 Shortness of breath: Secondary | ICD-10-CM

## 2017-09-27 NOTE — Progress Notes (Signed)
[facility] PROGRESS NOTE  Wanda [name] [id number] DOB: [birth year] DOA: 09/23/2017 PCP: Aretta Nip, MD  Brief summary   52 y.o. female with medical history significant for hemiplegic migraines, Asthma, pagets dx, presented to the ED with complaints of Cough and shortness of breath of 2 days duration associated with wheezing.  Cough is productive of greenish yellow sputum. Patient reports since January 25 when she woke up with a dry cough, fatigue she has mostly being feeling poorly. She saw her PCP and was prescribed a course of Augmentin and prednisone taper, noted some improvement after 3 days, but said by February 12 she was feeling poorly again saw her provider as prescribed Z-Pak and a prednisone taper, did not have much improvement. Saw a provider again February 19, another course of antibiotics and steroids. She was beginning to feel better and back to baseline with mostly resolved cough by the 25th march, when she was in the ED because her husband was sick, so she was exposed to sick contacts, and then this present episode of shortness of breath and new productive cough started.  Flu check had always been negative.     Assessment/Plan:  Acute asthma exacerbation, acute bronchitis . Chest x-ray no acute findings, CTA negative for PE. Flu negative, respiratory viral panel positive for coronavirus and rhinovirus. Blood cultures: NGTD. Reported finished steroids taper x2 , once in January, once a few days ago in [place]. Noted,  diffuse rhonchi and wheezing on admission.  -Slowly improving with IV solumedrol, bronchodilators, antitussives. Will transition to oral steroids in AM  Hypertension. cont Atenolol. reasonable inpatient control   History of hemiplegic migraine. no headaches this am  Obesity. Body mass index is 38.95 kg/m.   Code Status: full Family Communication: d/w patient, rn (indicate person spoken with, relationship, and if by phone, the  number) Disposition Plan: home in 24-48 hrs    Consultants:  None   Procedures:  none  Antibiotics:  none (indicate start date, and stop date if known)  HPI/Subjective: Alert. Coughing, afebrile   Objective: Vitals:   09/27/17 0626 09/27/17 0811  BP: (!) 147/92   Pulse: 71   Resp: 18   Temp: 98.1 F (36.7 C)   SpO2: 97% 97%    Intake/Output Summary (Last 24 hours) at 09/27/2017 1116 Last data filed at 09/27/2017 0543 Gross per 24 hour  Intake 1800 ml  Output -  Net 1800 ml   Filed Weights   09/24/17 0033  Weight: 109.5 kg (241 lb 4.8 oz)    Exam:   General:  No distress   Cardiovascular: s1,s2 rrr  Respiratory: few wheezing   Abdomen: soft, nt, nd   Musculoskeletal: no leg edema    Data Reviewed: Basic Metabolic Panel: Recent Labs  Lab 09/23/17 1126 09/25/17 0400 09/26/17 0438  NA 144 142 142  K 4.0 4.0 4.0  CL 110 112* 111  CO2 28 23 24   GLUCOSE 117* 251* 170*  BUN 10 10 12   CREATININE 0.86 0.79 0.84  CALCIUM 9.1 8.8* 8.8*  MG  --  2.2  --    Liver Function Tests: No results for input(s): AST, ALT, ALKPHOS, BILITOT, PROT, ALBUMIN in the last 168 hours. No results for input(s): LIPASE, AMYLASE in the last 168 hours. No results for input(s): AMMONIA in the last 168 hours. CBC: Recent Labs  Lab 09/23/17 1126 09/25/17 0400 09/26/17 0438  WBC 9.5 9.9 10.8*  NEUTROABS  --  8.4*  --   HGB 12.9  11.7* 12.0  HCT 40.4 36.2 38.8  MCV 92.4 89.4 93.3  PLT 163 159 180   Cardiac Enzymes: Recent Labs  Lab 09/24/17 0025 09/24/17 0635 09/24/17 1159  TROPONINI <0.03 <0.03 <0.03   BNP (last 3 results) No results for input(s): BNP in the last 8760 hours.  ProBNP (last 3 results) No results for input(s): PROBNP in the last 8760 hours.  CBG: No results for input(s): GLUCAP in the last 168 hours.  Recent Results (from the past 240 hour(s))  Culture, blood (routine x 2)     Status: None (Preliminary result)   Collection Time: 09/24/17  12:25 AM  Result Value Ref Range Status   Specimen Description   Final    BLOOD LEFT ANTECUBITAL Performed at [facility] [address], [place], [place] [address]    Special Requests   Final    BOTTLES DRAWN AEROBIC AND ANAEROBIC Blood Culture adequate volume Performed at [facility] [address], [place], [place] [address]    Culture   Final    NO GROWTH 2 DAYS Performed at [facility] [address], [place], [place] [address]    Report Status PENDING  Incomplete  Culture, blood (routine x 2)     Status: None (Preliminary result)   Collection Time: 09/24/17 12:35 AM  Result Value Ref Range Status   Specimen Description   Final    BLOOD RIGHT HAND Performed at [facility] [address], [place], [place] [address]    Special Requests   Final    BOTTLES DRAWN AEROBIC AND ANAEROBIC Blood Culture adequate volume Performed at [facility] [address], [place], [place] [address]    Culture   Final    NO GROWTH 2 DAYS Performed at [facility] [address], [place], [place] [address]    Report Status PENDING  Incomplete  Respiratory Panel by PCR     Status: Abnormal   Collection Time: 09/24/17  3:02 AM  Result Value Ref Range Status   Adenovirus NOT DETECTED NOT DETECTED Final   Coronavirus 229E NOT DETECTED NOT DETECTED Final   Coronavirus HKU1 NOT DETECTED NOT DETECTED Final   Coronavirus NL63 NOT DETECTED NOT DETECTED Final   Coronavirus OC43 DETECTED (A) NOT DETECTED Final   Metapneumovirus NOT DETECTED NOT DETECTED Final   Rhinovirus / Enterovirus DETECTED (A) NOT DETECTED Final   Influenza A NOT DETECTED NOT DETECTED Final   Influenza B NOT DETECTED NOT DETECTED Final   Parainfluenza Virus 1 NOT DETECTED NOT DETECTED Final   Parainfluenza Virus 2 NOT DETECTED NOT DETECTED Final   Parainfluenza Virus 3 NOT DETECTED NOT DETECTED Final   Parainfluenza Virus 4 NOT  DETECTED NOT DETECTED Final   Respiratory Syncytial Virus NOT DETECTED NOT DETECTED Final   Bordetella pertussis NOT DETECTED NOT DETECTED Final   Chlamydophila pneumoniae NOT DETECTED NOT DETECTED Final   Mycoplasma pneumoniae NOT DETECTED NOT DETECTED Final     Studies: No results found.  Scheduled Meds: . albuterol  2.5 mg Nebulization BID  . aspirin EC  325 mg Oral Daily  . atenolol  50 mg Oral Daily  . benzonatate  200 mg Oral QID  . busPIRone  15 mg Oral BID  . chlorhexidine  15 mL Mouth Rinse BID  . chlorproMAZINE  25 mg Oral TID  . enoxaparin (LOVENOX) injection  40 mg Subcutaneous QHS  . ferrous sulfate  325 mg Oral Q  breakfast  . guaiFENesin  600 mg Oral BID  . ipratropium-albuterol  3 mL Nebulization Once  . loratadine  10 mg Oral Daily  . mouth rinse  15 mL Mouth Rinse q12n4p  . methylPREDNISolone (SOLU-MEDROL) injection  60 mg Intravenous Q12H  . mometasone  2 spray Each Nare q morning - 10a  . montelukast  10 mg Oral QHS  . pantoprazole  40 mg Oral BID AC  . rosuvastatin  10 mg Oral Daily  . sertraline  200 mg Oral QHS  . tiZANidine  8 mg Oral QHS  . Topiramate ER  100 mg Oral QHS   Continuous Infusions:  Active Problems:   OBESITY   Acute bronchitis   Asthma    Time spent: >25 minutes     Kinnie Feil  [facility] Pager [phone]. If 7PM-7AM, please contact night-coverage at [email or web address], password [facility] 09/27/2017, 11:16 AM  LOS: 2 days

## 2017-09-27 NOTE — Care Management Note (Signed)
Case Management Note  Patient Details  Name: Wanda [name] MRN: [id number] Date of Birth: Apr 12, 1966  Subjective/Objective:   Pt admitted as IP with acute bronchitis, on Solu-Medrol               Action/Plan: Plan to discharge home. Will follow for any discharge needs.    Expected Discharge Date:  (unknown)               Expected Discharge Plan:  Home/Self Care  In-House Referral:     Discharge planning Services  CM Consult  Post Acute Care Choice:    Choice offered to:     DME Arranged:    DME Agency:     HH Arranged:    HH Agency:     Status of Service:  In process, will continue to follow  If discussed at Long Length of Stay Meetings, dates discussed:    Additional CommentsPurcell Mouton, RN 09/27/2017, 10:56 AM

## 2017-09-28 DIAGNOSIS — J4541 Moderate persistent asthma with (acute) exacerbation: Secondary | ICD-10-CM

## 2017-09-28 MED ORDER — BENZONATATE 200 MG PO CAPS: 200.0000 mg | ORAL_CAPSULE | Freq: Four times a day (QID) | ORAL | 0 refills | Status: DC

## 2017-09-28 MED ORDER — PREDNISONE 10 MG PO TABS: ORAL_TABLET | ORAL | 0 refills | Status: DC

## 2017-09-28 MED ORDER — ASPIRIN EC 325 MG PO TBEC: 325.0000 mg | DELAYED_RELEASE_TABLET | Freq: Every day | ORAL | Status: DC

## 2017-09-28 NOTE — Discharge Summary (Signed)
Physician Discharge Summary  Wanda [name]:[id number] DOB: 11/18/1965 DOA: 09/23/2017  PCP: Aretta Nip, MD  Admit date: 09/23/2017 Discharge date: 09/28/2017  Time spent: >35 minutes  Recommendations for Outpatient Follow-up:  F/u with PCP in 3-7 days as needed F/u with Pulmonology I n 2-3 weeks. needs PFTs  Discharge Diagnoses:  Active Problems:   OBESITY   Acute bronchitis   Asthma   Discharge Condition: stable   Diet recommendation: regular   Filed Weights   09/24/17 0033  Weight: 109.5 kg (241 lb 4.8 oz)    History of present illness:   52 y.o.femalewith medical history significantfor hemiplegicmigraines, Asthma,pagets dx,presented to the ED with complaints ofCoughand shortness of breath of 2 days duration associated with wheezing.Cough is productive of greenish yellow sputum.Patient reports since January 25 when she woke up with adrycough,fatigue she hasmostlybeingfeeling poorly. She saw her PCPand was prescribed a course of Augmentin and prednisone taper,noted some improvement after 3 days,but said by February 12 she was feeling poorly again saw her provider as prescribed Z-Pak and a prednisone taper,did not have much improvement. [place] provider again February 19,another course of antibiotics and steroids. She was beginning to feel better and back to baseline with mostly resolved cough by the 25th march,when she was in the ED because her husbandwas sick, so shewas exposed to sick contacts, and then this presentepisode of shortness of breathand newproductive cough started.Flu check had always been negative.    Hospital Course:   Acute asthma exacerbation, acute bronchitis, severe cough. Chest x-ray no acute findings, CTA negative for PE. Flu negative, respiratory viral panel positive for coronavirus and rhinovirus. Blood cultures: NGTD. Reported finished steroids taper x2 , once in January, once a few days ago in february. Noted,   diffuse rhonchi and wheezing on admission. She is started on iv steroids, scheduled/[prn bronchodilators, antitussives and slowly improved. No hypoxia, afebrile, wheezing ->resolved. Then she is transitioned to oral tapering steroid regimen, cont  bronchodilators, antitussives. Recommended to f/u with pulmonology in 2-3 weeks to have PFTs   Hypertension. cont Atenolol. reasonable inpatient control  History of hemiplegic migraine. no headaches   Obesity. Body mass index is 38.95 kg/m.     Procedures:  none (i.e. Studies not automatically included, echos, thoracentesis, etc; not x-rays)  Consultations:  none  Discharge Exam: Vitals:   09/28/17 0351 09/28/17 0854  BP: 139/69   Pulse: 71   Resp: 18   Temp: 98 F (36.7 C)   SpO2: 96% 95%    General: alert. No dsitress Cardiovascular: s1,s2 rrr Respiratory: CTA BL  Discharge Instructions  Discharge Instructions    Diet - low sodium heart healthy   Complete by:  As directed    Discharge instructions   Complete by:  As directed    Please follow up with primary car doctor in 3-7 days Please follow up with pulmonology in 2-3 weeks   Increase activity slowly   Complete by:  As directed      Allergies as of 09/28/2017      Reactions   Lisinopril Swelling, Other (See Comments)   SWELLING FACE/LIPS/NECK   Latex Rash   Oxycodone-acetaminophen Itching, Rash   Also percocet, Patient states "can tolerate"      Medication List    STOP taking these medications   doxycycline 100 MG EC tablet Commonly known as:  DORYX   ondansetron 8 MG tablet Commonly known as:  ZOFRAN   promethazine-dextromethorphan 6.25-15 MG/5ML syrup Commonly known as:  PROMETHAZINE-DM  TAKE these medications   albuterol 108 (90 Base) MCG/ACT inhaler Commonly known as:  PROVENTIL HFA;VENTOLIN HFA Inhale 1-2 puffs into the lungs every 6 (six) hours as needed for wheezing or shortness of breath.   albuterol (5 MG/ML) 0.5% nebulizer  solution Commonly known as:  PROVENTIL Take 2.5 mg by nebulization every 6 (six) hours as needed for wheezing or shortness of breath.   aspirin EC 325 MG tablet Take 1 tablet (325 mg total) by mouth daily.   atenolol 50 MG tablet Commonly known as:  TENORMIN TAKE 1 TABLET(50 MG) BY MOUTH DAILY   benzonatate 200 MG capsule Commonly known as:  TESSALON Take 1 capsule (200 mg total) by mouth 4 (four) times daily.   busPIRone 15 MG tablet Commonly known as:  BUSPAR Take 15 mg by mouth 2 (two) times daily.   cetirizine 10 MG chewable tablet Commonly known as:  ZYRTEC Chew 10 mg by mouth at bedtime.   chlorproMAZINE 25 MG tablet Commonly known as:  THORAZINE TAKE 1 TABLET(25 MG) BY MOUTH THREE TIMES DAILY AS NEEDED   ferrous sulfate 325 (65 FE) MG tablet Take 325 mg by mouth at bedtime.   Fluticasone-Salmeterol 250-50 MCG/DOSE Aepb Commonly known as:  ADVAIR Inhale 1 puff into the lungs 2 (two) times daily.   hydrochlorothiazide 12.5 MG capsule Commonly known as:  MICROZIDE Take 12.5 mg by mouth daily.   HYDROcodone-homatropine 5-1.5 MG/5ML syrup Commonly known as:  HYCODAN Take 5 mLs by mouth every 6 (six) hours as needed for cough.   mometasone 50 MCG/ACT nasal spray Commonly known as:  NASONEX Place 2 sprays into the nose daily.   montelukast 10 MG tablet Commonly known as:  SINGULAIR Take 10 mg by mouth at bedtime.   pantoprazole 40 MG tablet Commonly known as:  PROTONIX Take 40 mg by mouth 2 (two) times daily before a meal.   predniSONE 10 MG tablet Commonly known as:  DELTASONE Prednisone take 4 tablets daily for 3 days then 3 tablets daily for 3 days then 2 tablets daily for 3 days then 1 tablet daily for 3 days What changed:  additional instructions   rosuvastatin 10 MG tablet Commonly known as:  CRESTOR Take 10 mg by mouth daily.   sertraline 100 MG tablet Commonly known as:  ZOLOFT Take 100-200 mg by mouth 2 (two) times daily. Take 100 mg in the am  and 200 mg in the pm   tiZANidine 4 MG tablet Commonly known as:  ZANAFLEX TAKE 2 TABLETS BY MOUTH AT BEDTIME AS NEEDED FOR MUSCLE SPASMS   Topiramate ER 100 MG Cp24 Commonly known as:  TROKENDI XR Take 1 capsule by mouth daily.   Vitamin D (Ergocalciferol) 50000 units Caps capsule Commonly known as:  DRISDOL Take 50,000 Units by mouth every 7 (seven) days. mondays      Allergies  Allergen Reactions  . Lisinopril Swelling and Other (See Comments)    SWELLING FACE/LIPS/NECK  . Latex Rash  . Oxycodone-Acetaminophen Itching and Rash    Also percocet, Patient states "can tolerate"      The results of significant diagnostics from this hospitalization (including imaging, microbiology, ancillary and laboratory) are listed below for reference.    Significant Diagnostic Studies: Dg Chest 2 View  Result Date: 09/23/2017 CLINICAL DATA:  Dry cough beginning in January of this year which has become productive. Shortness of breath and chest pain. EXAM: CHEST  2 VIEW COMPARISON:  PA and lateral chest 09/14/2017, 09/23/2017 and 09/03/2015. FINDINGS:  The lungs are clear. Heart size is normal. There is no pneumothorax or pleural effusion. No acute bony abnormality. Thoracic spondylosis is noted. IMPRESSION: No acute disease. Electronically Signed   By: Inge Rise M.D.   On: 09/23/2017 12:10   Ct Angio Chest Pe W/cm &/or Wo Cm  Result Date: 09/23/2017 CLINICAL DATA:  Shortness of breath and chest pain. EXAM: CT ANGIOGRAPHY CHEST WITH CONTRAST TECHNIQUE: Multidetector CT imaging of the chest was performed using the standard protocol during bolus administration of intravenous contrast. Multiplanar CT image reconstructions and MIPs were obtained to evaluate the vascular anatomy. CONTRAST:  78mL ISOVUE-370 IOPAMIDOL (ISOVUE-370) INJECTION 76% COMPARISON:  12/25/2015 and chest radiograph from 09/23/2017 FINDINGS: Body habitus reduces diagnostic sensitivity and specificity. Cardiovascular: No  filling defect is identified in the pulmonary arterial tree to suggest pulmonary embolus. Mild cardiomegaly. Mediastinum/Nodes: No pathologic adenopathy or additional significant mediastinal abnormality. Lungs/Pleura: Unremarkable Upper Abdomen: Cholecystectomy. Stable 9 mm right gastric node, likely benign/incidental. 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 Musculoskeletal: Considerable thoracic spondylosis. Review of the MIP images confirms the above findings. IMPRESSION: 1. No filling defect is identified in the pulmonary arterial tree to suggest pulmonary embolus. 2. Mild cardiomegaly. 3. 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 4. Thoracic spondylosis. Electronically Signed   By: Van Clines M.D.   On: 09/23/2017 16:14    Microbiology: Recent Results (from the past 240 hour(s))  Culture, blood (routine x 2)     Status: None (Preliminary result)   Collection Time: 09/24/17 12:25 AM  Result Value Ref Range Status   Specimen Description   Final    BLOOD LEFT ANTECUBITAL Performed at [facility] [address] [place], [place] [address]    Special Requests   Final    BOTTLES DRAWN AEROBIC AND ANAEROBIC Blood Culture adequate volume Performed at [facility] [address] [place], [place] [address]    Culture   Final    NO GROWTH 3 DAYS Performed at [facility], [place] [address], [place], [place] [address]    Report Status PENDING  Incomplete  Culture, blood (routine x 2)     Status: None (Preliminary result)   Collection Time: 09/24/17 12:35 AM  Result Value Ref Range Status   Specimen Description   Final    BLOOD RIGHT  HAND Performed at [facility] [address] [place], [place] [address]    Special Requests   Final    BOTTLES DRAWN AEROBIC AND ANAEROBIC Blood Culture adequate volume Performed at [facility] [address], [place],  [address]    Culture   Final    NO GROWTH 3 DAYS Performed at [facility], [place] [address] [place],  [address]    Report Status PENDING  Incomplete  Respiratory Panel by PCR     Status: Abnormal   Collection Time: 09/24/17  3:02 AM  Result Value Ref Range Status   Adenovirus NOT DETECTED NOT DETECTED Final   Coronavirus 229E NOT DETECTED NOT DETECTED Final   Coronavirus HKU1 NOT DETECTED NOT DETECTED Final   Coronavirus NL63 NOT DETECTED NOT DETECTED Final   Coronavirus  OC43 DETECTED (A) NOT DETECTED Final   Metapneumovirus NOT DETECTED NOT DETECTED Final   Rhinovirus / Enterovirus DETECTED (A) NOT DETECTED Final   Influenza A NOT DETECTED NOT DETECTED Final   Influenza B NOT DETECTED NOT DETECTED Final   Parainfluenza Virus 1 NOT DETECTED NOT DETECTED Final   Parainfluenza Virus 2 NOT DETECTED NOT DETECTED Final   Parainfluenza Virus 3 NOT DETECTED NOT DETECTED Final   Parainfluenza Virus 4 NOT DETECTED NOT DETECTED Final   Respiratory Syncytial Virus NOT DETECTED NOT DETECTED Final   Bordetella pertussis NOT DETECTED NOT DETECTED Final   Chlamydophila pneumoniae NOT DETECTED NOT DETECTED Final   Mycoplasma pneumoniae NOT DETECTED NOT DETECTED Final     Labs: Basic Metabolic Panel: Recent Labs  Lab 09/23/17 1126 09/25/17 0400 09/26/17 0438  NA 144 142 142  K 4.0 4.0 4.0  CL 110 112* 111  CO2 28 23 24   GLUCOSE 117* 251* 170*  BUN 10 10 12   CREATININE 0.86 0.79 0.84  CALCIUM 9.1 8.8* 8.8*  MG  --  2.2  --    Liver Function Tests: No results for input(s): AST, ALT, ALKPHOS, BILITOT, PROT, ALBUMIN in the last 168 hours. No results for input(s): LIPASE, AMYLASE in the last 168 hours. No  results for input(s): AMMONIA in the last 168 hours. CBC: Recent Labs  Lab 09/23/17 1126 09/25/17 0400 09/26/17 0438  WBC 9.5 9.9 10.8*  NEUTROABS  --  8.4*  --   HGB 12.9 11.7* 12.0  HCT 40.4 36.2 38.8  MCV 92.4 89.4 93.3  PLT 163 159 180   Cardiac Enzymes: Recent Labs  Lab 09/24/17 0025 09/24/17 0635 09/24/17 1159  TROPONINI <0.03 <0.03 <0.03   BNP: BNP (last 3 results) No results for input(s): BNP in the last 8760 hours.  ProBNP (last 3 results) No results for input(s): PROBNP in the last 8760 hours.  CBG: No results for input(s): GLUCAP in the last 168 hours.     SignedKinnie Feil  [facility] 09/28/2017, 9:10 AM

## 2017-09-28 NOTE — Plan of Care (Signed)
  Health Behavior/Discharge Planning: Ability to manage health-related needs will improve 09/28/2017 0313 - Progressing by Ashley Murrain, RN   Clinical Measurements: Ability to maintain clinical measurements within normal limits will improve 09/28/2017 0313 - Progressing by Ashley Murrain, RN   Clinical Measurements: Diagnostic test results will improve 09/28/2017 0313 - Progressing by Ashley Murrain, RN   Clinical Measurements: Respiratory complications will improve 09/28/2017 0313 - Progressing by Ashley Murrain, RN

## 2017-09-28 NOTE — Care Management Important Message (Signed)
Important Message  Patient Details  Name: Wanda [name] MRN: [id number] Date of Birth: 11/25/1965   Medicare Important Message Given:  Yes    Kerin Salen 09/28/2017, 11:49 AMImportant Message  Patient Details  Name: Wanda [name] MRN: [id number] Date of Birth: 15-Jan-1966   Medicare Important Message Given:  Yes    Kerin Salen 09/28/2017, 11:49 AM

## 2017-09-29 LAB — CULTURE, BLOOD (ROUTINE X 2)
CULTURE: NO GROWTH
CULTURE: NO GROWTH
SPECIAL REQUESTS: ADEQUATE
Special Requests: ADEQUATE

## 2017-10-06 ENCOUNTER — Ambulatory Visit: Payer: Medicare Other | Admitting: Neurology

## 2017-10-11 ENCOUNTER — Emergency Department (HOSPITAL_COMMUNITY)
Admission: EM | Admit: 2017-10-11 | Discharge: 2017-10-11 | Disposition: A | Discharge status: Home / Self Care | Payer: Medicare Other | Attending: Emergency Medicine | Admitting: Emergency Medicine

## 2017-10-11 ENCOUNTER — Emergency Department (HOSPITAL_COMMUNITY): Payer: Medicare Other

## 2017-10-11 ENCOUNTER — Encounter (HOSPITAL_COMMUNITY): Payer: Self-pay | Admitting: Radiology

## 2017-10-11 ENCOUNTER — Other Ambulatory Visit: Payer: Self-pay

## 2017-10-11 DIAGNOSIS — M79605 Pain in left leg: Secondary | ICD-10-CM | POA: Diagnosis not present

## 2017-10-11 DIAGNOSIS — Z79899 Other long term (current) drug therapy: Secondary | ICD-10-CM | POA: Diagnosis not present

## 2017-10-11 DIAGNOSIS — Z91040 Latex allergy status: Secondary | ICD-10-CM | POA: Diagnosis not present

## 2017-10-11 DIAGNOSIS — R200 Anesthesia of skin: Secondary | ICD-10-CM

## 2017-10-11 DIAGNOSIS — Z8673 Personal history of transient ischemic attack (TIA), and cerebral infarction without residual deficits: Secondary | ICD-10-CM | POA: Diagnosis not present

## 2017-10-11 DIAGNOSIS — Z87891 Personal history of nicotine dependence: Secondary | ICD-10-CM | POA: Diagnosis not present

## 2017-10-11 DIAGNOSIS — R51 Headache: Secondary | ICD-10-CM | POA: Diagnosis present

## 2017-10-11 DIAGNOSIS — J45909 Unspecified asthma, uncomplicated: Secondary | ICD-10-CM | POA: Diagnosis not present

## 2017-10-11 DIAGNOSIS — Z96653 Presence of artificial knee joint, bilateral: Secondary | ICD-10-CM | POA: Diagnosis not present

## 2017-10-11 DIAGNOSIS — G43109 Migraine with aura, not intractable, without status migrainosus: Secondary | ICD-10-CM | POA: Diagnosis not present

## 2017-10-11 DIAGNOSIS — R531 Weakness: Secondary | ICD-10-CM

## 2017-10-11 LAB — CBC
HCT: 40 % (ref 36.0–46.0)
HEMOGLOBIN: 13.1 g/dL (ref 12.0–15.0)
MCH: 29.4 pg (ref 26.0–34.0)
MCHC: 32.8 g/dL (ref 30.0–36.0)
MCV: 89.9 fL (ref 78.0–100.0)
PLATELETS: 180 10*3/uL (ref 150–400)
RBC: 4.45 MIL/uL (ref 3.87–5.11)
RDW: 14.4 % (ref 11.5–15.5)
WBC: 8.5 10*3/uL (ref 4.0–10.5)

## 2017-10-11 LAB — I-STAT CHEM 8, ED
BUN: 9 mg/dL (ref 6–20)
CREATININE: 0.7 mg/dL (ref 0.44–1.00)
Calcium, Ion: 1.18 mmol/L (ref 1.15–1.40)
Chloride: 106 mmol/L (ref 101–111)
Glucose, Bld: 122 mg/dL — ABNORMAL HIGH (ref 65–99)
HEMATOCRIT: 39 % (ref 36.0–46.0)
Hemoglobin: 13.3 g/dL (ref 12.0–15.0)
POTASSIUM: 3.3 mmol/L — AB (ref 3.5–5.1)
SODIUM: 143 mmol/L (ref 135–145)
TCO2: 25 mmol/L (ref 22–32)

## 2017-10-11 LAB — I-STAT TROPONIN, ED: Troponin i, poc: 0 ng/mL (ref 0.00–0.08)

## 2017-10-11 LAB — RAPID URINE DRUG SCREEN, HOSP PERFORMED
Amphetamines: NOT DETECTED
BARBITURATES: NOT DETECTED
BENZODIAZEPINES: NOT DETECTED
COCAINE: NOT DETECTED
OPIATES: NOT DETECTED
TETRAHYDROCANNABINOL: NOT DETECTED

## 2017-10-11 LAB — COMPREHENSIVE METABOLIC PANEL
ALT: 27 U/L (ref 14–54)
ANION GAP: 12 (ref 5–15)
AST: 25 U/L (ref 15–41)
Albumin: 3.9 g/dL (ref 3.5–5.0)
Alkaline Phosphatase: 111 U/L (ref 38–126)
BILIRUBIN TOTAL: 0.8 mg/dL (ref 0.3–1.2)
BUN: 9 mg/dL (ref 6–20)
CHLORIDE: 108 mmol/L (ref 101–111)
CO2: 23 mmol/L (ref 22–32)
Calcium: 9 mg/dL (ref 8.9–10.3)
Creatinine, Ser: 0.82 mg/dL (ref 0.44–1.00)
GFR calc Af Amer: >60 mL/min (ref >60)
GFR calc non Af Amer: >60 mL/min (ref >60)
GLUCOSE: 125 mg/dL — AB (ref 65–99)
POTASSIUM: 3.3 mmol/L — AB (ref 3.5–5.1)
SODIUM: 143 mmol/L (ref 135–145)
TOTAL PROTEIN: 6.9 g/dL (ref 6.5–8.1)

## 2017-10-11 LAB — URINALYSIS, ROUTINE W REFLEX MICROSCOPIC
Bilirubin Urine: NEGATIVE
GLUCOSE, UA: NEGATIVE mg/dL
HGB URINE DIPSTICK: NEGATIVE
Ketones, ur: NEGATIVE mg/dL
LEUKOCYTES UA: NEGATIVE
Nitrite: NEGATIVE
PH: 8 (ref 5.0–8.0)
PROTEIN: NEGATIVE mg/dL
Specific Gravity, Urine: 1.023 (ref 1.005–1.030)

## 2017-10-11 LAB — CBG MONITORING, ED: GLUCOSE-CAPILLARY: 106 mg/dL — AB (ref 65–99)

## 2017-10-11 LAB — DIFFERENTIAL
BASOS PCT: 0 %
Basophils Absolute: 0 10*3/uL (ref 0.0–0.1)
EOS ABS: 0.3 10*3/uL (ref 0.0–0.7)
EOS PCT: 4 %
Lymphocytes Relative: 27 %
Lymphs Abs: 2.3 10*3/uL (ref 0.7–4.0)
Monocytes Absolute: 0.6 10*3/uL (ref 0.1–1.0)
Monocytes Relative: 7 %
NEUTROS PCT: 62 %
Neutro Abs: 5.3 10*3/uL (ref 1.7–7.7)

## 2017-10-11 LAB — I-STAT BETA HCG BLOOD, ED (MC, WL, AP ONLY): I-stat hCG, quantitative: <5 m[IU]/mL (ref <5)

## 2017-10-11 LAB — APTT: APTT: 29 s (ref 24–36)

## 2017-10-11 LAB — PROTIME-INR
INR: 0.99
PROTHROMBIN TIME: 13 s (ref 11.4–15.2)

## 2017-10-11 LAB — ETHANOL

## 2017-10-11 MED ORDER — KETOROLAC TROMETHAMINE 30 MG/ML IJ SOLN
30.0000 mg | Freq: Four times a day (QID) | INTRAMUSCULAR | Status: DC
  Administered 2017-10-11: 30 mg via INTRAVENOUS
  Filled 2017-10-11: qty 1

## 2017-10-11 MED ORDER — DIPHENHYDRAMINE HCL 50 MG/ML IJ SOLN
25.0000 mg | Freq: Four times a day (QID) | INTRAMUSCULAR | Status: DC
  Administered 2017-10-11: 25 mg via INTRAVENOUS
  Filled 2017-10-11: qty 1

## 2017-10-11 MED ORDER — IOPAMIDOL (ISOVUE-370) INJECTION 76%
INTRAVENOUS | Status: AC
  Administered 2017-10-11: 90 mL
  Filled 2017-10-11: qty 100

## 2017-10-11 MED ORDER — PROCHLORPERAZINE EDISYLATE 5 MG/ML IJ SOLN
10.0000 mg | Freq: Four times a day (QID) | INTRAMUSCULAR | Status: DC
  Administered 2017-10-11: 10 mg via INTRAVENOUS
  Filled 2017-10-11: qty 2

## 2017-10-11 NOTE — ED Notes (Addendum)
Pt has greater movement of left atrm and left foot to command but Still minimal movement of left foot on command.

## 2017-10-11 NOTE — Code Documentation (Signed)
Patient on the phone this morning with family member when she started to complain Left hand then arm and left side tingling.  LKW at 1100.  She called EMS.  EMS noted Left side weakness and slurred speech.  Patient arrived at 1220.  Stat labs and head CT done.  Neurological exam inconsistent. She denies HA.  Unable to hold Left arm or leg off the bed, but arm does not touch face when arm falling toward face.  Patient also splits midline of forehead with sensory change.  NIHSS 8.  CTA and CTP done.  Dr Carloyn Jaeger at bedside to assess patient.  Family at bedside when pt returned to ED.   Plan MRI Hand off with ED RN Everlene Farrier

## 2017-10-11 NOTE — ED Notes (Signed)
Placed purewick on pt. 

## 2017-10-11 NOTE — ED Triage Notes (Signed)
Pt arrives [facility] from home with c/o left sided weakness and facial weakness. Symptoms started at 1100 . LSN 1100

## 2017-10-11 NOTE — Discharge Instructions (Signed)
The testing today indicates that you have had a complicated migraine as a cause of your symptoms today.  Continue your usual treatments at home.  Call your neurologist for follow-up appointment to discuss the symptoms today and arrange for further treatment.

## 2017-10-11 NOTE — Consult Note (Addendum)
Requesting Physician: ED MD    Chief Complaint: Sudden onset of left-sided weakness and decreased sensation  History obtained from:  Patient     HPI:                                                                                                                                         Wanda [name] is an 52 y.o. female with past medical history of stroke, migraines, hypertension, hypercholesterolemia, fibromyalgia.  Patient comes by EMS to the emergency department due to having sudden onset of left-sided weakness and decreased sensation at 11:00 in the morning.  Upon arrival to the ED patient had right-sided deviation of her tongue, allowed her left arm to fall when lifted however had significant strength as if she was pushing down.  She was stating that she felt as though the left face arm and leg had Novocain and it.  EMS states when they arrived she was slumped over.  Patient was seen on January 2019 Dr. Tomi Likens for migraine headache.  She does also have a history seen on the third 2013 for left-sided headache associated with slurred speech, left facial droop and left side arm and leg weakness and numbness.  At that time she had an MRI and MRA which was negative.  She had a carotid Doppler which showed 40-59% bilateral ICA.  Her echo showed an EF of 60% with no PFO or ASD.  CT of head while in the ED did not show any intracranial abnormalities, head bleed, mass or lesion.  For this reason CTA of head and neck were obtained.    Date last known well: Date: 10/11/2017 Time last known well: Time: 11:00 tPA Given: No: Low stroke scale, inconsistent exam, Modified Rankin: Rankin Score=0  Past Medical History:  Diagnosis Date  . Acne   . Anemia    takes iron supplement  . Anxiety   . Asthma   . Chronic bronchitis ([place])   . Depression   . Family history of adverse reaction to anesthesia    pt's mother has hx. of post-op N/V  . Fibromyalgia   . GERD (gastroesophageal reflux disease)   .  History of blood transfusion   . History of TIA (transient ischemic attack)    x 2; left-sided weakness  . Hypercholesterolemia    no current med.  . Hypertension    states under control with meds., has been on med. since 06/2011  . Keloid 0/1093   umbilical  . Migraines   . Osteoarthritis   . Osteoarthritis   . Paget disease of bone   . PONV (postoperative nausea and vomiting)    states severe  . Seasonal allergies   . Stroke ([place]) 10/27/2012   TIA   1- 10/29/11-weakness left side    Past Surgical History:  Procedure Laterality Date  . CARPAL TUNNEL RELEASE Left 12/2013  . CARPAL TUNNEL RELEASE  Right 03/29/2015  . CHOLECYSTECTOMY  08/27/2009  . DIAGNOSTIC LAPAROSCOPY  early 2005  . ETHMOIDECTOMY Bilateral 04/30/2006  . FUNCTIONAL ENDOSCOPIC SINUS SURGERY  04/30/2006  . HYSTEROSCOPY W/D&C  04/21/2002   with lysis of adhesions  . KNEE ARTHROSCOPY Left 02/2000  . LAMINOTOMY Left 03/27/2010   L5-S1; microdissection  . LAMINOTOMY Bilateral 07/12/2007   L4-5  . LUMBAR FUSION Left 07/18/2010   L4-5; L5-S1  . LUMBAR FUSION  04/10/2014   L3-4, L4-5, L5-S1  . NASAL TURBINATE REDUCTION  04/30/2006   inferior  . TOTAL ABDOMINAL HYSTERECTOMY  11/01/2003   with lysis of adhesions; ovaries retained  . TOTAL KNEE ARTHROPLASTY Left 12/31/2014   Procedure: TOTAL LEFT KNEE ARTHROPLASTY;  Surgeon: Frederik Pear, MD;  Location: [facility];  Service: Orthopedics;  Laterality: Left;  . TOTAL KNEE ARTHROPLASTY Right 11/11/2015   Procedure: TOTAL KNEE ARTHROPLASTY;  Surgeon: Frederik Pear, MD;  Location: [facility];  Service: Orthopedics;  Laterality: Right;  Marland Kitchen VAGINAL HYSTERECTOMY     for DUB in 10/2003; partial    Family History  Problem Relation Age of Onset  . Anesthesia problems Mother        post-op N/V  . Anxiety disorder Father   . Depression Father   . Cervical cancer Maternal Grandmother   . Cancer Maternal Grandmother        mouth  . Heart attack Maternal Grandmother 55  . Hypertension Brother   . Heart  failure Brother        ICD   Social History:  reports that she quit smoking about 13 years ago. She smoked 0.00 packs per day. she has never used smokeless tobacco. She reports that she does not drink alcohol or use drugs.  Allergies:  Allergies  Allergen Reactions  . Lisinopril Swelling and Other (See Comments)    SWELLING FACE/LIPS/NECK  . Latex Rash  . Oxycodone-Acetaminophen Itching and Rash    Also percocet, Patient states "can tolerate"    Medications:                                                                                                                           No current facility-administered medications for this encounter.    Current Outpatient Medications  Medication Sig Dispense Refill  . albuterol (PROVENTIL HFA;VENTOLIN HFA) 108 (90 BASE) MCG/ACT inhaler Inhale 1-2 puffs into the lungs every 6 (six) hours as needed for wheezing or shortness of breath.     Marland Kitchen albuterol (PROVENTIL) (5 MG/ML) 0.5% nebulizer solution Take 2.5 mg by nebulization every 6 (six) hours as needed for wheezing or shortness of breath.    Marland Kitchen aspirin EC 325 MG tablet Take 1 tablet (325 mg total) by mouth daily.    Marland Kitchen atenolol (TENORMIN) 50 MG tablet TAKE 1 TABLET(50 MG) BY MOUTH DAILY 30 tablet 5  . benzonatate (TESSALON) 200 MG capsule Take 1 capsule (200 mg total) by mouth 4 (four) times daily. 20 capsule 0  . busPIRone (  BUSPAR) 15 MG tablet Take 15 mg by mouth 2 (two) times daily.     . cetirizine (ZYRTEC) 10 MG chewable tablet Chew 10 mg by mouth at bedtime.     . chlorproMAZINE (THORAZINE) 25 MG tablet TAKE 1 TABLET(25 MG) BY MOUTH THREE TIMES DAILY AS NEEDED 270 tablet 1  . ferrous sulfate 325 (65 FE) MG tablet Take 325 mg by mouth at bedtime.     . Fluticasone-Salmeterol (ADVAIR) 250-50 MCG/DOSE AEPB Inhale 1 puff into the lungs 2 (two) times daily.    . hydrochlorothiazide (MICROZIDE) 12.5 MG capsule Take 12.5 mg by mouth daily.    Marland Kitchen HYDROcodone-homatropine (HYCODAN) 5-1.5 MG/5ML syrup  Take 5 mLs by mouth every 6 (six) hours as needed for cough.    . mometasone (NASONEX) 50 MCG/ACT nasal spray Place 2 sprays into the nose daily.    . montelukast (SINGULAIR) 10 MG tablet Take 10 mg by mouth at bedtime.    . pantoprazole (PROTONIX) 40 MG tablet Take 40 mg by mouth 2 (two) times daily before a meal.     . predniSONE (DELTASONE) 10 MG tablet Prednisone take 4 tablets daily for 3 days then 3 tablets daily for 3 days then 2 tablets daily for 3 days then 1 tablet daily for 3 days 30 tablet 0  . rosuvastatin (CRESTOR) 10 MG tablet Take 10 mg by mouth daily.    . sertraline (ZOLOFT) 100 MG tablet Take 100-200 mg by mouth 2 (two) times daily. Take 100 mg in the am and 200 mg in the pm    . tiZANidine (ZANAFLEX) 4 MG tablet TAKE 2 TABLETS BY MOUTH AT BEDTIME AS NEEDED FOR MUSCLE SPASMS 120 tablet 1  . Topiramate ER (TROKENDI XR) 100 MG CP24 Take 1 capsule by mouth daily. 90 capsule 1  . Vitamin D, Ergocalciferol, (DRISDOL) 50000 UNITS CAPS Take 50,000 Units by mouth every 7 (seven) days. mondays     ROS:                                                                                                                                       History obtained from the patient  General ROS: negative for - chills, fatigue, fever, night sweats, weight gain or weight loss Psychological ROS: negative for - , hallucinations, memory difficulties, mood swings or  Ophthalmic ROS: negative for - blurry vision, double vision, eye pain or loss of vision ENT ROS: negative for - epistaxis, nasal discharge, oral lesions, sore throat, tinnitus or vertigo Respiratory ROS: negative for - cough,  shortness of breath or wheezing Cardiovascular ROS: negative for - chest pain, dyspnea on exertion,  Gastrointestinal ROS: negative for - abdominal pain, diarrhea,  nausea/vomiting or stool incontinence Genito-Urinary ROS: negative for - dysuria, hematuria, incontinence or urinary frequency/urgency Musculoskeletal ROS:  Positive for - muscular weakness Neurological ROS: as noted in HPI  General Examination:  Blood pressure 117/72, pulse 76, temperature 98.6 F (37 C), temperature source Oral, resp. rate 18, height 5\' 6"  (1.676 m), weight 109.4 kg (241 lb 2.9 oz), last menstrual period 10/14/2002, SpO2 96 %. HEENT-  Normocephalic, no lesions, without obvious abnormality.  Normal external eye and conjunctiva.   Cardiovascular- S1-S2 audible, pulses palpable throughout   Lungs-no rhonchi or wheezing noted, no excessive working breathing.  Saturations within normal limits Abdomen- All 4 quadrants palpated and nontender Extremities- Warm, dry and intact Musculoskeletal-no joint tenderness, deformity or swelling Skin-warm and dry, no hyperpigmentation, vitiligo, or suspicious lesions Neurological Examination Mental Status: Alert, oriented, thought content appropriate.  Speech fluent without evidence of aphasia.  Able to follow 3 step commands with the right side. Cranial Nerves: II:  Visual fields grossly normal to blink to threat III,IV, VI: ptosis not present, extra-ocular motions intact bilaterally, pupils equal, round, reactive to light and accommodation V,VII: Face is symmetric at rest.  Up when asking to smile both sides have a abnormal movement in which she will lift the right side than the left side and at times pucker her mouth.  She states that she has decreased sensation in the left aspect of her face.  It should be noted that she splits midline with tuning fork. VIII: hearing normal bilaterally IX,X: uvula rises symmetrically XI: bilateral shoulder shrug XII: Tongue deviates to the right Motor: Right arm and leg show 5 out of 5 strength.  Left arm and leg showed consistencies.  When formally testing she will allow them to fall to the bed however at times when her arm is lifted she is able to lift it and  hold it antigravity.  When arm is held above head and allowed to fall it does not fall straight down and avoids hitting her face.  When distracted it is noted that she is moving her left hand and at times legs. Sensory: Subjective decreased sensation on the left aspect including head arm and leg Deep Tendon Reflexes: Depressed throughout plantars: Mute bilaterally Cerebellar: Intact on the right with no dysmetria with heel to shin and finger to nose Gait: Not tested   Lab Results: Basic Metabolic Panel: Recent Labs  Lab 10/11/17 1226 10/11/17 1230  NA 143 143  K 3.3* 3.3*  CL 108 106  CO2 23  --   GLUCOSE 125* 122*  BUN 9 9  CREATININE 0.82 0.70  CALCIUM 9.0  --    CBC: Recent Labs  Lab 10/11/17 1226 10/11/17 1230  WBC 8.5  --   NEUTROABS 5.3  --   HGB 13.1 13.3  HCT 40.0 39.0  MCV 89.9  --   PLT 180  --    CBG: Recent Labs  Lab 10/11/17 1225  GLUCAP 106*   Imaging:  Assessment and plan discussed with with attending physician and they are in agreement.    Etta Quill PA-C [facility] [phone]  10/11/2017, 1:11 PM   Assessment: 52 y.o. female presenting to the emergency department with sudden onset of left facial, arm, leg decreased sensation and strength.  Exam showed inconsistencies with both sensation and strength.  CT, CTA of head and neck along with perfusion were negative-due to these reasons along with an NIH stroke scale of 6, TPA was not administered.  Although CVA cannot fully be ruled out most likely diagnosis is comp located migraine headache.   Stroke Risk Factors - hyperlipidemia and hypertension  Recommend: -MRI of brain without contrast when capable - Migraine cocktail of Benadryl,  Toradol and Compazine.  If this is unsuccessful would then attempt 500 mg of Depakote with 500 mg of Solu-Medrol. -PT OT   Attending Neurohospitalist Addendum Patient seen and examined with APP/Resident. Agree with the history and physical as  documented above. Agree with the plan as documented, which I helped formulate. I have independently reviewed the chart, obtained history, review of systems and examined the patient.I have personally reviewed pertinent head/neck/spine imaging (CT/MRI). Responded to code stroke for LSW and left sided numbness. Exam as above and inconsistent with midline splitting of vibration etc. NCCT head unremarkable. CTA/P also unremarkable. Pt. With h/o migraine and LSW p/w LSW, sudden onset at 1100hrs. Exam as above and inconsistent - suggestive of possible conversion. tPA was not offered due to the inconsistent exam and no evidence of abnormality on CT or CTA/P. Would recommend Migraine cocktail.  Needs MRI brain w/o contrast. If MRI is negative and symptoms resolved - can be discharged.  Please feel free to call with any questions. --- Amie Portland, MD [facility] Pager: [phone]  If 7pm to 7am, please call on call as listed on AMION.

## 2017-10-11 NOTE — ED Provider Notes (Signed)
[facility] EMERGENCY DEPARTMENT Provider Note   CSN: [id number] Arrival date & time: 10/11/17  1220   An emergency department physician performed an initial assessment on this suspected stroke patient at 1222.  History   Chief Complaint Chief Complaint  Patient presents with  . Weakness  . Code Stroke    HPI Wanda [name] is a 52 y.o. female.  She presents for evaluation of left-sided weakness, starting around 11 AM this morning.  She also describes a tingling sensation in her left face left arm and left leg.  She presents by EMS.  She has frequent migraine headaches.  She denies recent fever, chills, nausea, vomiting or dizziness.  She is worried that she might be having a TIA or stroke.  Patient was seen on arrival, today, by neurology, as a code stroke patient.  Immediate evaluation done with CT imaging, did not reveal stroke.  She also had advanced CT imaging done.  The neurologist thought she might be having a complex migraine so ordered a migraine cocktail, and suggested an MRI to evaluate for a stroke which was not visible on CT imaging.  There are no other known modifying factors.  HPI  Past Medical History:  Diagnosis Date  . Acne   . Anemia    takes iron supplement  . Anxiety   . Asthma   . Chronic bronchitis ([place])   . Depression   . Family history of adverse reaction to anesthesia    pt's mother has hx. of post-op N/V  . Fibromyalgia   . GERD (gastroesophageal reflux disease)   . History of blood transfusion   . History of TIA (transient ischemic attack)    x 2; left-sided weakness  . Hypercholesterolemia    no current med.  . Hypertension    states under control with meds., has been on med. since 06/2011  . Keloid 01/9891   umbilical  . Migraines   . Osteoarthritis   . Osteoarthritis   . Paget disease of bone   . PONV (postoperative nausea and vomiting)    states severe  . Seasonal allergies   . Stroke ([place]) 10/27/2012   TIA    1- 10/29/11-weakness left side    Patient Active Problem List   Diagnosis Date Noted  . Acute bronchitis 09/23/2017  . Asthma 09/23/2017  . Paget disease of bone 05/04/2016  . Bone lesion 01/22/2016  . Osteoarthritis of right knee 11/11/2015  . Primary osteoarthritis of right knee 11/10/2015  . Arthritis of knee 12/31/2014  . Primary osteoarthritis of left knee 10/12/2014  . Lumbar spinal stenosis 04/10/2014  . 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 Cervical disc disorder with radiculopathy of cervical region 05/03/2013  . Weakness of left side of body 10/28/2012  . Complicated migraine 11/94/1740  . Disturbance of skin sensation 06/02/2012  . CVA (cerebral infarction)? vs Atypical Migraine 10/28/2011  . Sinusitis 04/10/2011  . Borderline blood pressure 04/10/2011  . CHRONIC RHINITIS 04/28/2010  . Lumbar L5 diskektomy 02/17/2010  . GERD 08/20/2009  . ABDOMINAL PAIN, UNSPECIFIED 08/20/2009  . HYPERCHOLESTEROLEMIA 12/26/2007  . OBESITY 12/07/2007  . DEPRESSION 12/07/2007  . URI, ACUTE 12/07/2007  . TMJ PAIN 12/07/2007  . FATIGUE 12/07/2007  . HEADACHE, CHRONIC 12/07/2007  . TOBACCO ABUSE, HX OF 12/07/2007    Past Surgical History:  Procedure Laterality Date  . CARPAL TUNNEL RELEASE Left 12/2013  . CARPAL TUNNEL RELEASE Right 03/29/2015  . CHOLECYSTECTOMY  08/27/2009  . DIAGNOSTIC LAPAROSCOPY  early 2005  .  ETHMOIDECTOMY Bilateral 04/30/2006  . FUNCTIONAL ENDOSCOPIC SINUS SURGERY  04/30/2006  . HYSTEROSCOPY W/D&C  04/21/2002   with lysis of adhesions  . KNEE ARTHROSCOPY Left 02/2000  . LAMINOTOMY Left 03/27/2010   L5-S1; microdissection  . LAMINOTOMY Bilateral 07/12/2007   L4-5  . LUMBAR FUSION Left 07/18/2010   L4-5; L5-S1  . LUMBAR FUSION  04/10/2014   L3-4, L4-5, L5-S1  . NASAL TURBINATE REDUCTION  04/30/2006   inferior  . TOTAL ABDOMINAL HYSTERECTOMY  11/01/2003   with lysis of adhesions; ovaries retained  . TOTAL KNEE ARTHROPLASTY Left 12/31/2014   Procedure: TOTAL  LEFT KNEE ARTHROPLASTY;  Surgeon: Frederik Pear, MD;  Location: [facility];  Service: Orthopedics;  Laterality: Left;  . TOTAL KNEE ARTHROPLASTY Right 11/11/2015   Procedure: TOTAL KNEE ARTHROPLASTY;  Surgeon: Frederik Pear, MD;  Location: [facility];  Service: Orthopedics;  Laterality: Right;  Marland Kitchen VAGINAL HYSTERECTOMY     for DUB in 10/2003; partial    OB History    No data available       Home Medications    Prior to Admission medications   Medication Sig Start Date End Date Taking? Authorizing Provider  albuterol (PROVENTIL HFA;VENTOLIN HFA) 108 (90 BASE) MCG/ACT inhaler Inhale 1-2 puffs into the lungs every 6 (six) hours as needed for wheezing or shortness of breath.    Yes [provider]  albuterol (PROVENTIL) (5 MG/ML) 0.5% nebulizer solution Take 2.5 mg by nebulization every 6 (six) hours as needed for wheezing or shortness of breath.   Yes [provider]  aspirin EC 325 MG tablet Take 1 tablet (325 mg total) by mouth daily. 09/28/17  Yes Kinnie Feil, MD  atenolol (TENORMIN) 50 MG tablet TAKE 1 TABLET(50 MG) BY MOUTH DAILY 12/24/16  Yes Jaffe, Adam R, DO  Azelaic Acid (FINACEA EX) Apply 1 application topically daily.   Yes [provider]  benzonatate (TESSALON) 200 MG capsule Take 1 capsule (200 mg total) by mouth 4 (four) times daily. 09/28/17  Yes Buriev, Arie Sabina, MD  busPIRone (BUSPAR) 15 MG tablet Take 15 mg by mouth 2 (two) times daily.    Yes [provider]  cetirizine (ZYRTEC) 10 MG chewable tablet Chew 10 mg by mouth at bedtime.    Yes [provider]  chlorproMAZINE (THORAZINE) 25 MG tablet TAKE 1 TABLET(25 MG) BY MOUTH THREE TIMES DAILY AS NEEDED 08/03/17  Yes Tomi Likens, Adam R, DO  ferrous sulfate 325 (65 FE) MG tablet Take 325 mg by mouth at bedtime.    Yes [provider]  Fluticasone-Salmeterol (ADVAIR) 250-50 MCG/DOSE AEPB Inhale 1 puff into the lungs 2 (two) times daily.   Yes [provider]  hydrochlorothiazide  (MICROZIDE) 12.5 MG capsule Take 12.5 mg by mouth daily.   Yes [provider]  mometasone (NASONEX) 50 MCG/ACT nasal spray Place 2 sprays into the nose daily.   Yes [provider]  montelukast (SINGULAIR) 10 MG tablet Take 10 mg by mouth at bedtime.   Yes [provider]  pantoprazole (PROTONIX) 40 MG tablet Take 40 mg by mouth 2 (two) times daily before a meal.    Yes [provider]  rosuvastatin (CRESTOR) 10 MG tablet Take 10 mg by mouth daily.   Yes [provider]  sertraline (ZOLOFT) 100 MG tablet Take 100-200 mg by mouth 2 (two) times daily. Take 100 mg in the am and 200 mg in the pm   Yes [provider]  tiZANidine (ZANAFLEX) 4 MG tablet  TAKE 2 TABLETS BY MOUTH AT BEDTIME AS NEEDED FOR MUSCLE SPASMS 08/03/17  Yes Tomi Likens, Adam R, DO  Topiramate ER (TROKENDI XR) 100 MG CP24 Take 1 capsule by mouth daily. 08/03/17  Yes Jaffe, Adam R, DO  triamcinolone cream (KENALOG) 0.1 % Apply 1 application topically daily.   Yes [provider]  Vitamin D, Ergocalciferol, (DRISDOL) 50000 UNITS CAPS Take 50,000 Units by mouth every 7 (seven) days. mondays   Yes [provider]  predniSONE (DELTASONE) 10 MG tablet Prednisone take 4 tablets daily for 3 days then 3 tablets daily for 3 days then 2 tablets daily for 3 days then 1 tablet daily for 3 days Patient not taking: Reported on 10/11/2017 09/28/17   Kinnie Feil, MD    Family History Family History  Problem Relation Age of Onset  . Anesthesia problems Mother        post-op N/V  . Anxiety disorder Father   . Depression Father   . Cervical cancer Maternal Grandmother   . Cancer Maternal Grandmother        mouth  . Heart attack Maternal Grandmother 55  . Hypertension Brother   . Heart failure Brother        ICD    Social History Social History   Tobacco Use  . Smoking status: Former Smoker    Packs/day: 0.00    Last attempt to quit: 07/20/2004    Years since quitting:  13.2  . Smokeless tobacco: Never Used  Substance Use Topics  . Alcohol use: No  . Drug use: No     Allergies   Lisinopril; Latex; and Oxycodone-acetaminophen   Review of Systems Review of Systems  All other systems reviewed and are negative.    Physical Exam Updated Vital Signs BP 92/61   Pulse 71   Temp 98.6 F (37 C) (Oral)   Resp 19   Ht 5\' 6"  (1.676 m)   Wt 109.4 kg (241 lb 2.9 oz)   LMP 10/14/2002 (Within Weeks)   SpO2 95%   BMI 38.93 kg/m   Physical Exam  Constitutional: She is oriented to person, place, and time. She appears well-developed. No distress.  Overweight  HENT:  Head: Normocephalic and atraumatic.  Eyes: Conjunctivae and EOM are normal. Pupils are equal, round, and reactive to light.  Neck: Normal range of motion and phonation normal. Neck supple.  Cardiovascular: Normal rate and regular rhythm.  Pulmonary/Chest: Effort normal and breath sounds normal. She exhibits no tenderness.  Abdominal: Soft. She exhibits no distension. There is no tenderness. There is no guarding.  Musculoskeletal: Normal range of motion.  Neurological: She is alert and oriented to person, place, and time. She exhibits normal muscle tone.  No dysarthria, or aphasia.  No visible facial droop.  With request to protrude tongue, her tongue deviates to the right, and an apparent intentional fashion.  With strength testing, she has normal tone in arms and legs bilaterally.  When I hold her left arm up, and let go she drops it to the stretcher.  When asked to lift the left leg off the stretcher she is unable to.  Skin: Skin is warm and dry.  Psychiatric: She has a normal mood and affect. Her behavior is normal. Judgment and thought content normal.  Nursing note and vitals reviewed.    ED Treatments / Results  Labs (all labs ordered are listed, but only abnormal results are displayed) Labs Reviewed  COMPREHENSIVE METABOLIC PANEL - Abnormal; Notable for the following  components:        Result Value   Potassium 3.3 (*)    Glucose, Bld 125 (*)    All other components within normal limits  URINALYSIS, ROUTINE W REFLEX MICROSCOPIC - Abnormal; Notable for the following components:   Color, Urine STRAW (*)    All other components within normal limits  I-STAT CHEM 8, ED - Abnormal; Notable for the following components:   Potassium 3.3 (*)    Glucose, Bld 122 (*)    All other components within normal limits  CBG MONITORING, ED - Abnormal; Notable for the following components:   Glucose-Capillary 106 (*)    All other components within normal limits  ETHANOL  PROTIME-INR  APTT  CBC  DIFFERENTIAL  RAPID URINE DRUG SCREEN, HOSP PERFORMED  I-STAT TROPONIN, ED  I-STAT BETA HCG BLOOD, ED ([facility], [facility], [facility] ONLY)    EKG  EKG Interpretation  Date/Time:  Monday October 11 2017 13:05:36 EDT Ventricular Rate:  73 PR Interval:    QRS Duration: 94 QT Interval:  400 QTC Calculation: 441 R Axis:   63 Text Interpretation:  Sinus rhythm Abnormal R-wave progression, early transition Since last tracing QT has shortened and is now normal Confirmed by Daleen Bo [phone]) on 10/11/2017 1:28:30 PM       Radiology Ct Angio Head W Or Wo Contrast  Result Date: 10/11/2017 CLINICAL DATA:  52 year old female code stroke with left side weakness. EXAM: CT ANGIOGRAPHY HEAD AND NECK CT PERFUSION BRAIN TECHNIQUE: Multidetector CT imaging of the head and neck was performed using the standard protocol during bolus administration of intravenous contrast. Multiplanar CT image reconstructions and MIPs were obtained to evaluate the vascular anatomy. Carotid stenosis measurements (when applicable) are obtained utilizing NASCET criteria, using the distal internal carotid diameter as the denominator. Multiphase CT imaging of the brain was performed following IV bolus contrast injection. Subsequent parametric perfusion maps were calculated using RAPID software. CONTRAST:  74mL ISOVUE-370 IOPAMIDOL  (ISOVUE-370) INJECTION 76% COMPARISON:  Head CT without contrast 1230 hours today. FINDINGS: CT Brain Perfusion Findings: ASPECTS at 1230 hours today was 10. CBF (<30%) Volume: None. Perfusion (Tmax>6.0s) volume: None. Mismatch Volume: Not applicable. Infarction Location: Not applicable. CTA NECK Skeleton: No acute osseous abnormality identified. Upper chest: Negative upper lungs.  No mediastinal lymphadenopathy. Other neck: Negative.  No neck mass or lymphadenopathy. Aortic arch: 3 vessel arch configuration. No arch and minimal great vessel origin atherosclerosis. Right carotid system: Normal right CCA aside from mild tortuosity. At the posterior right ICA origin there is soft and calcified plaque. Proximal right ICA stenosis is less than 50 % with respect to the distal vessel. Mildly tortuous cervical right ICA. Left carotid system: Normal left CCA aside from mild tortuosity. There is minimal soft and calcified plaque at the left ICA origin. No stenosis. Mildly tortuous cervical left ICA. Vertebral arteries: No proximal right subclavian artery stenosis. Normal right vertebral artery origin. The right vertebral artery is patent to the skull base without stenosis. Mild soft and calcified plaque at the left subclavian artery origin without stenosis. Mildly tortuous proximal left subclavian. Normal left vertebral artery origin. Vertebral arteries are codominant, the left is patent to the skull base without stenosis. CTA HEAD Posterior circulation: Normal distal vertebral arteries and vertebrobasilar junction. Patent PICA origins. Patent basilar artery. Patent AICA and SCA origins. Normal PCA origins. Posterior communicating arteries are diminutive or absent. Bilateral PCA branches are within normal limits. Anterior circulation: Both ICA siphons are patent. No left siphon atherosclerosis or  stenosis. There is mild calcified plaque on the right without stenosis. Normal ophthalmic artery origins. Patent carotid termini.  Normal MCA and ACA origins. Anterior communicating artery bilateral ACA branches are within normal limits. Left MCA M1 segment, bifurcation, and left MCA branches are within normal limits. Right MCA M1 segment, bifurcation, right MCA branches are within normal limits. Venous sinuses: Patent. Anatomic variants: None. Review of the MIP images confirms the above findings IMPRESSION: 1. Negative for emergent large vessel occlusion and no infarct core or ischemia detected by CT Perfusion. 2. Mild right ICA origin soft and calcified plaque without stenosis. Minimal carotid atherosclerosis otherwise. 3. Negative posterior circulation. Electronically Signed   By: Genevie Ann M.D.   On: 10/11/2017 13:17   Ct Angio Neck W Or Wo Contrast  Result Date: 10/11/2017 CLINICAL DATA:  53 year old female code stroke with left side weakness. EXAM: CT ANGIOGRAPHY HEAD AND NECK CT PERFUSION BRAIN TECHNIQUE: Multidetector CT imaging of the head and neck was performed using the standard protocol during bolus administration of intravenous contrast. Multiplanar CT image reconstructions and MIPs were obtained to evaluate the vascular anatomy. Carotid stenosis measurements (when applicable) are obtained utilizing NASCET criteria, using the distal internal carotid diameter as the denominator. Multiphase CT imaging of the brain was performed following IV bolus contrast injection. Subsequent parametric perfusion maps were calculated using RAPID software. CONTRAST:  82mL ISOVUE-370 IOPAMIDOL (ISOVUE-370) INJECTION 76% COMPARISON:  Head CT without contrast 1230 hours today. FINDINGS: CT Brain Perfusion Findings: ASPECTS at 1230 hours today was 10. CBF (<30%) Volume: None. Perfusion (Tmax>6.0s) volume: None. Mismatch Volume: Not applicable. Infarction Location: Not applicable. CTA NECK Skeleton: No acute osseous abnormality identified. Upper chest: Negative upper lungs.  No mediastinal lymphadenopathy. Other neck: Negative.  No neck mass or  lymphadenopathy. Aortic arch: 3 vessel arch configuration. No arch and minimal great vessel origin atherosclerosis. Right carotid system: Normal right CCA aside from mild tortuosity. At the posterior right ICA origin there is soft and calcified plaque. Proximal right ICA stenosis is less than 50 % with respect to the distal vessel. Mildly tortuous cervical right ICA. Left carotid system: Normal left CCA aside from mild tortuosity. There is minimal soft and calcified plaque at the left ICA origin. No stenosis. Mildly tortuous cervical left ICA. Vertebral arteries: No proximal right subclavian artery stenosis. Normal right vertebral artery origin. The right vertebral artery is patent to the skull base without stenosis. Mild soft and calcified plaque at the left subclavian artery origin without stenosis. Mildly tortuous proximal left subclavian. Normal left vertebral artery origin. Vertebral arteries are codominant, the left is patent to the skull base without stenosis. CTA HEAD Posterior circulation: Normal distal vertebral arteries and vertebrobasilar junction. Patent PICA origins. Patent basilar artery. Patent AICA and SCA origins. Normal PCA origins. Posterior communicating arteries are diminutive or absent. Bilateral PCA branches are within normal limits. Anterior circulation: Both ICA siphons are patent. No left siphon atherosclerosis or stenosis. There is mild calcified plaque on the right without stenosis. Normal ophthalmic artery origins. Patent carotid termini. Normal MCA and ACA origins. Anterior communicating artery bilateral ACA branches are within normal limits. Left MCA M1 segment, bifurcation, and left MCA branches are within normal limits. Right MCA M1 segment, bifurcation, right MCA branches are within normal limits. Venous sinuses: Patent. Anatomic variants: None. Review of the MIP images confirms the above findings IMPRESSION: 1. Negative for emergent large vessel occlusion and no infarct core or  ischemia detected by CT Perfusion. 2. Mild right ICA origin  soft and calcified plaque without stenosis. Minimal carotid atherosclerosis otherwise. 3. Negative posterior circulation. Electronically Signed   By: Genevie Ann M.D.   On: 10/11/2017 13:17   Mr Brain Wo Contrast  Result Date: 10/11/2017 CLINICAL DATA:  Focal neuro deficit, < 6 hrs, stroke suspected EXAM: MRI HEAD WITHOUT CONTRAST TECHNIQUE: Multiplanar, multiecho pulse sequences of the brain and surrounding structures were obtained without intravenous contrast. COMPARISON:  Head CT and CTA and CT perfusion 10/11/2017 FINDINGS: Brain: The midline structures are normal. There is no acute infarct or acute hemorrhage. No mass lesion, hydrocephalus, dural abnormality or extra-axial collection. The brain parenchymal signal is normal for the patient's age. No age-advanced or lobar predominant atrophy. No chronic microhemorrhage or superficial siderosis. Vascular: Major intracranial arterial and venous sinus flow voids are preserved. Skull and upper cervical spine: The visualized skull base, calvarium, upper cervical spine and extracranial soft tissues are normal. Sinuses/Orbits: No fluid levels or advanced mucosal thickening. No mastoid or middle ear effusion. Normal orbits. IMPRESSION: Normal brain MRI. Electronically Signed   By: Ulyses Jarred M.D.   On: 10/11/2017 18:51   Ct Cerebral Perfusion W Contrast  Result Date: 10/11/2017 CLINICAL DATA:  52 year old female code stroke with left side weakness. EXAM: CT ANGIOGRAPHY HEAD AND NECK CT PERFUSION BRAIN TECHNIQUE: Multidetector CT imaging of the head and neck was performed using the standard protocol during bolus administration of intravenous contrast. Multiplanar CT image reconstructions and MIPs were obtained to evaluate the vascular anatomy. Carotid stenosis measurements (when applicable) are obtained utilizing NASCET criteria, using the distal internal carotid diameter as the denominator. Multiphase CT  imaging of the brain was performed following IV bolus contrast injection. Subsequent parametric perfusion maps were calculated using RAPID software. CONTRAST:  53mL ISOVUE-370 IOPAMIDOL (ISOVUE-370) INJECTION 76% COMPARISON:  Head CT without contrast 1230 hours today. FINDINGS: CT Brain Perfusion Findings: ASPECTS at 1230 hours today was 10. CBF (<30%) Volume: None. Perfusion (Tmax>6.0s) volume: None. Mismatch Volume: Not applicable. Infarction Location: Not applicable. CTA NECK Skeleton: No acute osseous abnormality identified. Upper chest: Negative upper lungs.  No mediastinal lymphadenopathy. Other neck: Negative.  No neck mass or lymphadenopathy. Aortic arch: 3 vessel arch configuration. No arch and minimal great vessel origin atherosclerosis. Right carotid system: Normal right CCA aside from mild tortuosity. At the posterior right ICA origin there is soft and calcified plaque. Proximal right ICA stenosis is less than 50 % with respect to the distal vessel. Mildly tortuous cervical right ICA. Left carotid system: Normal left CCA aside from mild tortuosity. There is minimal soft and calcified plaque at the left ICA origin. No stenosis. Mildly tortuous cervical left ICA. Vertebral arteries: No proximal right subclavian artery stenosis. Normal right vertebral artery origin. The right vertebral artery is patent to the skull base without stenosis. Mild soft and calcified plaque at the left subclavian artery origin without stenosis. Mildly tortuous proximal left subclavian. Normal left vertebral artery origin. Vertebral arteries are codominant, the left is patent to the skull base without stenosis. CTA HEAD Posterior circulation: Normal distal vertebral arteries and vertebrobasilar junction. Patent PICA origins. Patent basilar artery. Patent AICA and SCA origins. Normal PCA origins. Posterior communicating arteries are diminutive or absent. Bilateral PCA branches are within normal limits. Anterior circulation: Both  ICA siphons are patent. No left siphon atherosclerosis or stenosis. There is mild calcified plaque on the right without stenosis. Normal ophthalmic artery origins. Patent carotid termini. Normal MCA and ACA origins. Anterior communicating artery bilateral ACA branches are within normal limits.  Left MCA M1 segment, bifurcation, and left MCA branches are within normal limits. Right MCA M1 segment, bifurcation, right MCA branches are within normal limits. Venous sinuses: Patent. Anatomic variants: None. Review of the MIP images confirms the above findings IMPRESSION: 1. Negative for emergent large vessel occlusion and no infarct core or ischemia detected by CT Perfusion. 2. Mild right ICA origin soft and calcified plaque without stenosis. Minimal carotid atherosclerosis otherwise. 3. Negative posterior circulation. Electronically Signed   By: Genevie Ann M.D.   On: 10/11/2017 13:17   Ct Head Code Stroke Wo Contrast  Result Date: 10/11/2017 CLINICAL DATA:  Code stroke.  Left-sided weakness and numbness. EXAM: CT HEAD WITHOUT CONTRAST TECHNIQUE: Contiguous axial images were obtained from the base of the skull through the vertex without intravenous contrast. COMPARISON:  Brain MRI 10/03/2015 FINDINGS: Brain: There is no evidence of acute infarct, intracranial hemorrhage, mass, midline shift, or extra-axial fluid collection. The ventricles and sulci are normal. Vascular: Calcified atherosclerosis involving the carotid siphons. No hyperdense vessel. Skull: No fracture or focal osseous lesion. Sinuses/Orbits: Prior functional sinus surgery.  No acute findings. Other: None. ASPECTS [facility] Stroke Program Early CT Score) - Ganglionic level infarction (caudate, lentiform nuclei, internal capsule, insula, M1-M3 cortex): 7 - Supraganglionic infarction (M4-M6 cortex): 3 Total score (0-10 with 10 being normal): 10 IMPRESSION: 1. No evidence of acute intracranial abnormality. Unremarkable CT appearance of the brain. 2. ASPECTS is  10. These results were communicated to Dr. Rory Percy at 12:45 pm on 10/11/2017 by text page via the [facility] messaging system. Electronically Signed   By: Logan Bores M.D.   On: 10/11/2017 12:45    Procedures .Critical Care Performed by: Daleen Bo, MD Authorized by: Daleen Bo, MD   Critical care provider statement:    Critical care time (minutes):  40   Critical care start time:  10/11/2017 2:15 PM   Critical care end time:  10/11/2017 7:00 PM   Critical care time was exclusive of:  Separately billable procedures and treating other patients   Critical care was necessary to treat or prevent imminent or life-threatening deterioration of the following conditions:  CNS failure or compromise   Critical care was time spent personally by me on the following activities:  Blood draw for specimens, development of treatment plan with patient or surrogate, discussions with consultants, evaluation of patient's response to treatment, examination of patient, obtaining history from patient or surrogate, ordering and performing treatments and interventions, ordering and review of laboratory studies, pulse oximetry, re-evaluation of patient's condition, review of old charts and ordering and review of radiographic studies   (including critical care time)  Medications Ordered in ED Medications  ketorolac (TORADOL) 30 MG/ML injection 30 mg (30 mg Intravenous Given 10/11/17 1422)    And  prochlorperazine (COMPAZINE) injection 10 mg (10 mg Intravenous Given 10/11/17 1412)    And  diphenhydrAMINE (BENADRYL) injection 25 mg (25 mg Intravenous Given 10/11/17 1427)  iopamidol (ISOVUE-370) 76 % injection (90 mLs  Contrast Given 10/11/17 1245)     Initial Impression / Assessment and Plan / ED Course  I have reviewed the triage vital signs and the nursing notes.  Pertinent labs & imaging results that were available during my care of the patient were reviewed by me and considered in my medical decision making (see  chart for details).  Clinical Course as of Oct 12 1903  Mon Oct 11, 2017  1424 Patient examined by me prior to receiving migraine cocktail.  [EW]  1857 Case  discussed with the neuro hospitalist, both at the time she was initially seen, and now.  MRI brain negative for acute stroke, or significant ongoing central nervous system abnormality.  [EW]  8099 Screening evaluation to evaluate for causes of weakness, including urine drug screen which is negative, complete metabolic panel normal except potassium low 3.3 and glucose high 125, electrolyte panel normal, and urinalysis negative.  [EW]    Clinical Course User Index [EW] Daleen Bo, MD     Patient Vitals for the past 24 hrs:  BP Temp Temp src Pulse Resp SpO2 Height Weight  10/11/17 1720 92/61 - - 71 19 95 % - -  10/11/17 1700 112/67 - - 67 17 95 % - -  10/11/17 1645 (!) 104/54 - - 73 18 98 % - -  10/11/17 1630 102/73 - - 66 16 (!) 87 % - -  10/11/17 1615 115/66 - - 64 17 100 % - -  10/11/17 1600 110/61 - - 65 16 96 % - -  10/11/17 1530 (!) 91/51 - - 66 17 94 % - -  10/11/17 1515 (!) 93/41 - - 66 15 95 % - -  10/11/17 1445 109/76 - - - 18 - - -  10/11/17 1430 (!) 109/50 - - 68 19 96 % - -  10/11/17 1415 (!) 109/43 - - 71 16 99 % - -  10/11/17 1400 (!) 122/59 - - 69 18 98 % - -  10/11/17 1345 (!) 110/52 - - 69 20 98 % - -  10/11/17 1330 133/74 - - 80 16 96 % - -  10/11/17 1315 121/63 - - 74 18 97 % - -  10/11/17 1313 - - - - - - 5\' 6"  (1.676 m) 109.4 kg (241 lb 2.9 oz)  10/11/17 1240 117/72 98.6 F (37 C) Oral 76 18 96 % - -  10/11/17 1224 - - - - - 95 % - -    6:57 PM Reevaluation with update and discussion. After initial assessment and treatment, an updated evaluation reveals following discussion of negative MRI results, and treatment for migraine, patient states her symptoms have almost entirely resolved.  Findings discussed with patient with her family members present, and all questions were answered. Daleen Bo      Final Clinical Impressions(s) / ED Diagnoses   Final diagnoses:  Complicated migraine   Evaluation consistent with complex migraine, improved after treatment with migraine cocktail.  Doubt CVA, metabolic instability or impending vascular collapse.  Nursing Notes Reviewed/ Care Coordinated Applicable Imaging Reviewed Interpretation of Laboratory Data incorporated into ED treatment  The patient appears reasonably screened and/or stabilized for discharge and I doubt any other medical condition or other [facility] requiring further screening, evaluation, or treatment in the ED at this time prior to discharge.  Plan: Home Medications-continue current medications; Home Treatments-gradually advance activity; return here if the recommended treatment, does not improve the symptoms; Recommended follow up-PCP, PRN.  Neurology for follow-up appointment and treatment   ED Discharge Orders    None       Daleen Bo, MD 10/14/17 1156

## 2017-10-11 NOTE — Progress Notes (Addendum)
SAME DAY PROGRESS NOTE MRI brain reviewed personally. Also reviewed report. No stroke. No acute change. No new recs and no further neurological work up indicated. Needs outpatient f/u for migraine, subjective paresthesias as well as conversion disorder. Communicated plan to EDP Dr. Eulis Foster. Please call neurology with questions.  -- Amie Portland, MD [facility] Pager: [phone] If 7pm to 7am, please call on call as listed on AMION.  No charge note

## 2017-10-11 NOTE — ED Notes (Signed)
Pt alert and states her fibromyalgia has flared up.

## 2017-10-11 NOTE — ED Notes (Signed)
PT states understanding of care given, follow up care. PT ambulated from ED to car with a steady gait.  

## 2017-10-25 ENCOUNTER — Ambulatory Visit: Payer: Medicare Other | Admitting: Internal Medicine

## 2017-10-25 ENCOUNTER — Encounter: Payer: Self-pay | Admitting: Internal Medicine

## 2017-10-25 VITALS — BP 126/74 | HR 84 | Temp 98.3°F | Ht 66.0 in | Wt 245.0 lb

## 2017-10-25 DIAGNOSIS — J45991 Cough variant asthma: Secondary | ICD-10-CM | POA: Diagnosis not present

## 2017-10-25 DIAGNOSIS — I10 Essential (primary) hypertension: Secondary | ICD-10-CM

## 2017-10-25 LAB — NITRIC OXIDE: NITRIC OXIDE: 19

## 2017-10-25 MED ORDER — PREDNISONE 10 MG PO TABS
ORAL_TABLET | ORAL | 0 refills | Status: DC
Start: 2017-10-25 — End: 2017-11-04

## 2017-10-25 MED ORDER — BISOPROLOL FUMARATE 5 MG PO TABS: 5.0000 mg | ORAL_TABLET | Freq: Every day | ORAL | 11 refills | Status: DC

## 2017-10-25 MED ORDER — ACETAMINOPHEN-CODEINE #3 300-30 MG PO TABS: 1.0000 | ORAL_TABLET | ORAL | 0 refills | Status: AC | PRN

## 2017-10-25 NOTE — Patient Instructions (Addendum)
Change protonix to 40 mg Take 30- 60 min before your first and last meals of the day   Only use your albuterol nebulizer as a rescue medication to be used if you can't catch your breath by resting or doing a relaxed purse lip breathing pattern.  - The less you use it, the better it will work when you need it. - Ok to use up to  every 4 hours if you must but call for immediate appointment if use goes up over your usual need    Stop tenormin and replace with  Bisoprolol 5 mg daily   The key to effective treatment for your cough is eliminating the non-stop cycle of cough you're stuck in long enough to let your airway heal completely and then see if there is anything still making you cough once you stop the cough suppression, but this should take no more than 5 days to figure out  First take delsym two tsp every 12 hours and supplement if needed with  tramadol 50 mg up to 2 every 4 hours to suppress the urge to cough at all or even clear your throat. Swallowing water or using ice chips/non mint and menthol containing candies (such as lifesavers or sugarless jolly ranchers) are also effective.  You should rest your voice and avoid activities that you know make you cough.  Once you have eliminated the cough for 3 straight days try reducing the tramadol first,  then the delsym as tolerated.    Prednisone 10 mg take  4 each am x 2 days,   2 each am x 2 days,  1 each am x 2 days and stop (this is to eliminate allergies and inflammation from coughing)  Protonix (pantoprazole) Take 30-60 min before first meal of the day and Pepcid 20 mg one bedtime plus chlorpheniramine 4 mg x 1-2 at bedtime (both available over the counter)  until cough is completely gone for at least a week without the need for cough suppression  GERD (REFLUX)  is an extremely common cause of respiratory symptoms, many times with no significant heartburn at all.    It can be treated with medication, but also with lifestyle changes  including avoidance of late meals, excessive alcohol, smoking cessation, and avoid fatty foods, chocolate, peppermint, colas, red wine, and acidic juices such as orange juice.  NO MINT OR MENTHOL PRODUCTS SO NO COUGH DROPS   USE HARD CANDY INSTEAD (jolley ranchers or Stover's or Lifesavers (all available in sugarless versions) NO OIL BASED VITAMINS - use powdered substitutes.   Please schedule a follow up office visit in 2 weeks, sooner if needed  with all medications /inhalers/ solutions in hand so we can verify exactly what you are taking. This includes all medications from all doctors and over the counters separated into which ones are automatic vs which ones are as needed  - add needs allergy profile if not better

## 2017-10-25 NOTE — Progress Notes (Signed)
Subjective:    Patient ID: Wanda [name], female    DOB: 02/23/1966, 52 y.o.   MRN: [id number]  Brief patient profile:  79 yobf   quit smoking 2005, with a history of recurrent cyclical cough associated with sinus disease, status post sinus surgery October, 2007 by Dr Lucia Gaskins      NP eval: 04/10/2011 Acute OV  Pt presents for an acute office visit. Complains of cough w/ yellow to green phlem, wheezing, chest tightness, nasal congestion increase SOB x 2 days. Pt states she thinks she has bronchitis.  Sinus congestion and drainage , left ear pain . OTC not helping.  rec Omnicef 300mg  Twice daily  For 10 days  Mucinex DM Twice daily  As needed  Cough/congestion  Saline nasal rinses As needed     Admit date: 09/23/2017 Discharge date: 09/28/2017  Time s   Recommendations for Outpatient Follow-up:  F/u with PCP in 3-7 days as needed F/u with Pulmonology I n 2-3 weeks. needs PFTs  Discharge Diagnoses:  Active Problems:   OBESITY   Acute bronchitis   Asthma     10/25/2017 1st [facility] office visit in [place]  Re-establish  Chief Complaint  Patient presents with  . Pulmonary Consult    Referred by Dr. Bonita Quin. Pt c/o cough since Jan 2019. Cough is non prod. She can't lie down flat b/c of the cough. She is not sleeping well.  She also c/o SOB, worse when she coughs. She states "my chest feels like it has bricks on it".   pattern of recurrent "bronchitis" rx with cough med/abx per Dr Zadie Rhine with typically needs pred and nebulizer to get over and nasal sprays returned to [facility] [place]  Who rec surgery she declined and since then "sick up 4 x year" same pattern  and in Dec 2018 maint on singulair/ advair disc/ zyrtec and nasonex then the" worse ever cough" onset Jan 25th and no response to the usual rx so referred to pulmonary clinic 10/25/2017 by Dr   Radene Ou   Cough is 24/7 some better p noct meds/ gags but no vomit / no purulent sputum Gets up about 9 am  and fist meal 10 30 am and takes ppi 30 min prior to first meal but the second dose is after supper Mostly sob with coughing fits        NO  obvious day to day or daytime variability or assoc excess/ purulent sputum or mucus plugs or hemoptysis or cp or chest tightness, subjective wheeze or overt   hb symptoms. No unusual exposure hx or h/o childhood pna/ asthma or knowledge of premature birth.   Also denies any obvious fluctuation of symptoms with weather or environmental changes or other aggravating or alleviating factors except as outlined above   Current Allergies, Complete Past Medical History, Past Surgical History, Family History, and Social History were reviewed in [employer] record.  ROS  The following are not active complaints unless bolded Hoarseness, sore throat, dysphagia, dental problems, itching, sneezing,  nasal congestion or discharge of excess mucus or purulent secretions, ear ache,   fever, chills, sweats, unintended wt loss or wt gain, classically pleuritic or exertional cp,  orthopnea pnd or leg swelling, presyncope, palpitations, abdominal pain, anorexia, nausea, vomiting, diarrhea  or change in bowel habits or change in bladder habits, change in stools or change in urine, dysuria, hematuria,  rash, arthralgias, visual complaints, headache, numbness, weakness or ataxia or problems with  walking or coordination,  change in mood/affect or memory.        Current Meds  Medication Sig  . albuterol (PROVENTIL HFA;VENTOLIN HFA) 108 (90 BASE) MCG/ACT inhaler Inhale 1-2 puffs into the lungs every 6 (six) hours as needed for wheezing or shortness of breath.   Marland Kitchen albuterol (PROVENTIL) (5 MG/ML) 0.5% nebulizer solution Take 2.5 mg by nebulization every 6 (six) hours as needed for wheezing or shortness of breath.  Marland Kitchen aspirin EC 325 MG tablet Take 1 tablet (325 mg total) by mouth daily.  Marland Kitchen atenolol (TENORMIN) 50 MG tablet TAKE 1 TABLET(50 MG) BY MOUTH DAILY  .  busPIRone (BUSPAR) 15 MG tablet Take 15 mg by mouth 2 (two) times daily.   . cetirizine (ZYRTEC) 10 MG chewable tablet Chew 10 mg by mouth at bedtime.   . chlorproMAZINE (THORAZINE) 25 MG tablet TAKE 1 TABLET(25 MG) BY MOUTH THREE TIMES DAILY AS NEEDED  . diclofenac sodium (VOLTAREN) 1 % GEL Apply topically 4 (four) times daily.  . ferrous sulfate 325 (65 FE) MG tablet Take 325 mg by mouth at bedtime.   . Fluticasone-Salmeterol (ADVAIR) 250-50 MCG/DOSE AEPB Inhale 1 puff into the lungs 2 (two) times daily.  . hydrochlorothiazide (MICROZIDE) 12.5 MG capsule Take 12.5 mg by mouth daily.  . mometasone (NASONEX) 50 MCG/ACT nasal spray Place 2 sprays into the nose daily.  . montelukast (SINGULAIR) 10 MG tablet Take 10 mg by mouth at bedtime.  . pantoprazole (PROTONIX) 40 MG tablet Take 40 mg by mouth 2 (two) times daily before a meal.   . rosuvastatin (CRESTOR) 10 MG tablet Take 10 mg by mouth daily.  . sertraline (ZOLOFT) 100 MG tablet Take 100-200 mg by mouth 2 (two) times daily. Take 100 mg in the am and 200 mg in the pm  . tiZANidine (ZANAFLEX) 4 MG tablet TAKE 2 TABLETS BY MOUTH AT BEDTIME AS NEEDED FOR MUSCLE SPASMS  . Topiramate ER (TROKENDI XR) 100 MG CP24 Take 1 capsule by mouth daily.  . Vitamin D, Ergocalciferol, (DRISDOL) 50000 UNITS CAPS Take 50,000 Units by mouth every 7 (seven) days. mondays                       Past Medical History:  DEPRESSION (ICD-311)  FATIGUE (ICD-780.79)  HEADACHE, CHRONIC (ICD-784.0) Dr. Gwenlyn Saran follow-up  TOBACCO ABUSE, HX OF (ICD-V15.82) quit smoking 2005  TMJ PAIN (ICD-524.62)  OBESITY (ICD-278.00) peak 233 8/08 target 179 ideal 161  Sinusitis sinus surgery 04/2006 Dr. Radene Journey  RUQ Pain  - U/S ABD August 20, 2009 > pos gallstones > Lap chole 08/28/09  Low Back strain 02/16/10 ...................................................................Marland KitchenKritzer  - Radicular features Left began 8/2 sudden onset > rx prednisone dose pack March 05, 2010  - MRI ordered March 05, 2010>>> Pos for nerve compression  - NS eval scheduled for 03/10/10 > Laminectomy 03/27/10    Past Surgical History:  10/2003 status post total abdominal hysterectomy but still has ovaries  back surgery Dec 08 and 03/27/10  Lap chole 08/28/09               Objective:   Physical Exam   Obese frustrated bf nad with severe harsh upper airway coughing fits on insp and prominent pseudowheeze  Wt Readings from Last 3 Encounters:  10/25/17 245 lb (111.1 kg)  10/11/17 241 lb 2.9 oz (109.4 kg)  09/24/17 241 lb 4.8 oz (109.5 kg)     Vital signs reviewed - Note on arrival 02  sats  97% on RA     HEENT: nl dentition, and oropharynx. Nl external ear canals without cough reflex - mild to mod bilateral non-specific turbinate edema    NECK :  without JVD/Nodes/TM/ nl carotid upstrokes bilaterally   LUNGS: no acc muscle use,  Nl contour chest which is clear to A and P bilaterally without cough on insp or exp maneuvers   CV:  RRR  no s3 or murmur or increase in P2, and no edema   ABD:  soft and nontender with nl inspiratory excursion in the supine position. No bruits or organomegaly appreciated, bowel sounds nl  MS:  Nl gait/ ext warm without deformities, calf tenderness, cyanosis or clubbing No obvious joint restrictions   SKIN: warm and dry without lesions    NEURO:  alert, approp, nl sensorium with  no motor or cerebellar deficits apparent.     I personally reviewed images and agree with radiology impression as follows:  CXR:   09/23/17  No acute disease.  MRI sinus 10/11/17 wnl          Assessment & Plan:

## 2017-10-26 ENCOUNTER — Encounter: Payer: Self-pay | Admitting: Internal Medicine

## 2017-10-26 DIAGNOSIS — I10 Essential (primary) hypertension: Secondary | ICD-10-CM | POA: Insufficient documentation

## 2017-10-26 NOTE — Assessment & Plan Note (Signed)
Body mass index is 39.54 kg/m.  -  trending up slightly  Lab Results  Component Value Date   TSH 1.72 04/28/2013     Contributing to gerd risk/ doe/reviewed the need and the process to achieve and maintain neg calorie balance > defer f/u primary care including intermittently monitoring thyroid status

## 2017-10-26 NOTE — Assessment & Plan Note (Signed)
In the setting of respiratory symptoms of unknown etiology,  It would be preferable to use bystolic, the most beta -1  selective Beta blocker available in sample form, with bisoprolol the most selective generic choice  on the market, at least on a trial basis, to make sure the spillover Beta 2 effects of the less specific Beta blockers are not contributing to this patient's symptoms.   Try bisoprolol 5 mg daily

## 2017-10-26 NOTE — Assessment & Plan Note (Addendum)
FENO 10/25/2017  =   19 on advair 250 bid > try off    DDX of  difficult airways management almost all start with A and  include Adherence, Ace Inhibitors, Acid Reflux, Active Sinus Disease, Alpha 1 Antitripsin deficiency, Anxiety masquerading as Airways dz,  ABPA,  Allergy(esp in young), Aspiration (esp in elderly), Adverse effects of meds,  Active smokers, A bunch of PE's (a small clot burden can't cause this syndrome unless there is already severe underlying pulm or vascular dz with poor reserve) plus two Bs  = Bronchiectasis and Beta blocker use..and one C= CHF   Adherence is always the initial "prime suspect" and is a multilayered concern that requires a "trust but verify" approach in every patient - starting with knowing how to use medications, especially inhalers, correctly, keeping up with refills and understanding the fundamental difference between maintenance and prns vs those medications only taken for a very short course and then stopped and not refilled.   ? Acid (or non-acid) GERD > always difficult to exclude as up to 75% of pts in some series report no assoc GI/ Heartburn symptoms> rec max (24h)  acid suppression and diet restrictions/ reviewed and instructions given in writing.  - Of the three most common causes of  Sub-acute or recurrent or chronic cough, only one (GERD)  can actually contribute to/ trigger  the other two (asthma and post nasal drip syndrome)  and perpetuate the cylce of cough.  While not intuitively obvious, many patients with chronic low grade reflux do not cough until there is a primary insult that disturbs the protective epithelial barrier and exposes sensitive nerve endings.   This is typically viral but can be direct physical injury such as with an endotracheal tube.   The point is that once this occurs, it is difficult to eliminate the cycle  using anything but a maximally effective acid suppression regimen at least in the short run, accompanied by an appropriate  diet to address non acid GERD and control / eliminate the cough itself for at least 3 days with codeine    ? Active sinus dz > doubt by recent MRI - continue nasonex   ? Adverse effects of dpi causing uacs - Upper airway cough syndrome (previously labeled PNDS),  is so named because it's frequently impossible to sort out how much is  CR/sinusitis with freq throat clearing (which can be related to primary GERD)   vs  causing  secondary (" extra esophageal")  GERD from wide swings in gastric pressure that occur with throat clearing, often  promoting self use of mint and menthol lozenges that reduce the lower esophageal sphincter tone and exacerbate the problem further in a cyclical fashion.   These are the same pts (now being labeled as having "irritable larynx syndrome" by some cough centers) who not infrequently have a history of having failed to tolerate ace inhibitors(as is the case here),  dry powder inhalers or biphosphonates or report having atypical/extraesophageal reflux symptoms that don't respond to standard doses of PPI  and are easily confused as having aecopd or asthma flares by even experienced allergists/ pulmonologists (myself included).  rx max gerd rx and 1st gen H1 blockers per guidelines    ? Allergy/ asthma > not sure this is ALLERGIC asthma at all based  on FENO so low during flare  So try off advair and just rx  Prednisone 10 mg take  4 each am x 2 days,   2 each am  x 2 days,  1 each am x 2 days  and continue singulair and just use saba neb so as not to trigger the cough. May need to add back low dose hfa ICS and do full allergy profile on return.  ? Anxiety > usually at the bottom of this list of usual suspects but should be   higher on this pt's based on H and P and note already on psychotropics and may interfere with adherence and also interpretation of response or lack thereof to symptom management which can be quite subjective.   ? Beta blocker effects> see hbp   Total  time devoted to counseling  > 50 % of initial 60 min office visit:  review case with pt/ discussion of options/alternatives/ personally creating written customized instructions  in presence of pt  then going over those specific  Instructions directly with the pt including how to use all of the meds but in particular covering each new medication in detail and the difference between the maintenance= "automatic" meds and the prns using an action plan format for the latter (If this problem/symptom => do that organization reading Left to right).  Please see AVS from this visit for a full list of these instructions which I personally wrote for this pt and  are unique to this visit.

## 2017-10-28 ENCOUNTER — Ambulatory Visit: Payer: Medicare Other | Admitting: Neurology

## 2017-11-04 ENCOUNTER — Encounter: Payer: Self-pay | Admitting: Internal Medicine

## 2017-11-04 ENCOUNTER — Ambulatory Visit: Payer: Medicare Other | Admitting: Internal Medicine

## 2017-11-04 VITALS — BP 102/60 | HR 94 | Ht 66.0 in | Wt 237.0 lb

## 2017-11-04 DIAGNOSIS — J45991 Cough variant asthma: Secondary | ICD-10-CM | POA: Diagnosis not present

## 2017-11-04 DIAGNOSIS — A084 Viral intestinal infection, unspecified: Secondary | ICD-10-CM

## 2017-11-04 NOTE — Progress Notes (Signed)
Subjective:    Patient ID: Wanda [name], female    DOB: August 03, 1965, 52 y.o.   MRN: [id number]  Brief patient profile:  56 yobf   quit smoking 2005, with a history of recurrent cyclical cough associated with sinus disease, status post sinus surgery October, 2007 by Dr Lucia Gaskins      NP eval: 04/10/2011 Acute OV  Pt presents for an acute office visit. Complains of cough w/ yellow to green phlem, wheezing, chest tightness, nasal congestion increase SOB x 2 days. Pt states she thinks she has bronchitis.  Sinus congestion and drainage , left ear pain . OTC not helping.  rec Omnicef 300mg  Twice daily  For 10 days  Mucinex DM Twice daily  As needed  Cough/congestion  Saline nasal rinses As needed     Admit date: 09/23/2017 Discharge date: 09/28/2017  Time s   Recommendations for Outpatient Follow-up:  F/u with PCP in 3-7 days as needed F/u with Pulmonology I n 2-3 weeks. needs PFTs  Discharge Diagnoses:  Active Problems:   OBESITY   Acute bronchitis   Asthma     10/25/2017 1st [facility] office visit in [place]  Re-establish  Chief Complaint  Patient presents with  . Pulmonary Consult    Referred by Dr. Bonita Quin. Pt c/o cough since Jan 2019. Cough is non prod. She can't lie down flat b/c of the cough. She is not sleeping well.  She also c/o SOB, worse when she coughs. She states "my chest feels like it has bricks on it".   pattern of recurrent "bronchitis" rx with cough med/abx per Dr Zadie Rhine with typically needs pred and nebulizer to get over and nasal sprays returned to [facility] [place]  Who rec surgery she declined and since then "sick up 4 x year" same pattern  and in Dec 2018 maint on singulair/ advair disc/ zyrtec and nasonex then the" worse ever cough" onset Jan 25th 2019  and no response to the usual rx so referred to pulmonary clinic 10/25/2017 by Dr   Radene Ou Cough is 24/7 some better p noct meds/ gags but no vomit / no purulent sputum Gets up about 9  am and fist meal 10 30 am and takes ppi 30 min prior to first meal but the second dose is after supper Mostly sob with coughing fits rec   Change protonix to 40 mg Take 30- 60 min before your first and last meals of the day  Only use your albuterol nebulizer   Stop tenormin and replace with  Bisoprolol 5 mg daily  First take delsym two tsp every 12 hours and supplement if needed with  Tylenol #3 mg up to 2 every 4 hours to suppress the urge to cough at all or even clear your throat. .   Prednisone 10 mg take  4 each am x 2 days,   2 each am x 2 days,  1 each am x 2 days and stop (this is to eliminate allergies and inflammation from coughing) Protonix (pantoprazole) Take 30-60 min before first meal of the day and Pepcid 20 mg one bedtime plus chlorpheniramine 4 mg x 1-2 at bedtime (both available over the counter)  until cough is completely gone for at least a week without the need for cough suppression GERD diet  Please schedule a follow up office visit in 2 weeks, sooner if needed  with all medications /inhalers/ solutions in hand so we can verify exactly what you are taking.  This includes all medications from all doctors and over the counters separated into which ones are automatic vs which ones are as needed  - add needs allergy profile if not better      11/04/2017  f/u ov/Wanda [name] re: cough much better off advair/ getting over viral GI problems  Chief Complaint  Patient presents with  . Follow-up    her breathing has improved and she has not had to use her rescue inhaler or neb.    Dyspnea:  Gone/Not limited by breathing from desired activities   Cough: gone/ slt tickle when goes outside  Sleep: ok SABA use: none despite off advair   No obvious day to day or daytime variability or assoc excess/ purulent sputum or mucus plugs or hemoptysis or cp or chest tightness, subjective wheeze or overt sinus or hb symptoms. No unusual exposure hx or h/o childhood pna/ asthma or knowledge of premature  birth.  Sleeping  ok  without nocturnal  or early am exacerbation  of respiratory  c/o's or need for noct saba. Also denies any obvious fluctuation of symptoms with weather or environmental changes or other aggravating or alleviating factors except as outlined above   Current Allergies, Complete Past Medical History, Past Surgical History, Family History, and Social History were reviewed in [employer] record.  ROS  The following are not active complaints unless bolded Hoarseness, sore throat, dysphagia, dental problems, itching, sneezing,  nasal congestion or discharge of excess mucus or purulent secretions, ear ache,   Fever- resolved, chills, sweats, unintended wt loss or wt gain, classically pleuritic or exertional cp,  orthopnea pnd or arm/hand swelling  or leg swelling, presyncope, palpitations, abdominal pain, anorexia, nausea, vomiting, diarrhea  or change in bowel habits or change in bladder habits, change in stools or change in urine, dysuria, hematuria,  rash, arthralgias, visual complaints, headache, numbness, weakness or ataxia or problems with walking or coordination,  change in mood or  memory.        Current Meds  Medication Sig  . Acetaminophen-Codeine (TYLENOL/CODEINE #3) 300-30 MG tablet Take 1 tablet by mouth every 4 (four) hours as needed for pain.  Marland Kitchen albuterol (PROAIR HFA) 108 (90 Base) MCG/ACT inhaler Inhale 2 puffs into the lungs every 6 (six) hours as needed for wheezing or shortness of breath.  Marland Kitchen albuterol (PROVENTIL) (5 MG/ML) 0.5% nebulizer solution Take 2.5 mg by nebulization every 6 (six) hours as needed for wheezing or shortness of breath.  Marland Kitchen aspirin EC 325 MG tablet Take 1 tablet (325 mg total) by mouth daily.  . Azelaic Acid (FINACEA EX) Apply 1 application topically daily.  . bisoprolol (ZEBETA) 5 MG tablet Take 1 tablet (5 mg total) by mouth daily.  . busPIRone (BUSPAR) 15 MG tablet Take 15 mg by mouth 2 (two) times daily.   .  chlorproMAZINE (THORAZINE) 25 MG tablet TAKE 1 TABLET(25 MG) BY MOUTH THREE TIMES DAILY AS NEEDED  . diclofenac sodium (VOLTAREN) 1 % GEL Apply topically 4 (four) times daily.  . ferrous sulfate 325 (65 FE) MG tablet Take 325 mg by mouth at bedtime.   . hydrochlorothiazide (MICROZIDE) 12.5 MG capsule Take 12.5 mg by mouth daily.  . mometasone (NASONEX) 50 MCG/ACT nasal spray Place 2 sprays into the nose daily.  . montelukast (SINGULAIR) 10 MG tablet Take 10 mg by mouth at bedtime.  . ondansetron (ZOFRAN) 8 MG tablet Take 8 mg by mouth every 8 (eight) hours as needed for nausea or vomiting.  Marland Kitchen  pantoprazole (PROTONIX) 40 MG tablet Take 40 mg by mouth 2 (two) times daily before a meal.   . rosuvastatin (CRESTOR) 10 MG tablet Take 10 mg by mouth daily.  . sertraline (ZOLOFT) 100 MG tablet Take 100-200 mg by mouth 2 (two) times daily. Take 100 mg in the am and 200 mg in the pm  . tiZANidine (ZANAFLEX) 4 MG tablet TAKE 2 TABLETS BY MOUTH AT BEDTIME AS NEEDED FOR MUSCLE SPASMS  . Topiramate ER (TROKENDI XR) 100 MG CP24 Take 1 capsule by mouth daily.  . Vitamin D, Ergocalciferol, (DRISDOL) 50000 UNITS CAPS Take 50,000 Units by mouth every 7 (seven) days. mondays                         Past Medical History:  DEPRESSION (ICD-311)  FATIGUE (ICD-780.79)  HEADACHE, CHRONIC (ICD-784.0) Dr. Gwenlyn Saran follow-up  TOBACCO ABUSE, HX OF (ICD-V15.82) quit smoking 2005  TMJ PAIN (ICD-524.62)  OBESITY (ICD-278.00) peak 233 8/08 target 179 ideal 161  Sinusitis sinus surgery 04/2006 Dr. Radene Journey  RUQ Pain  - U/S ABD August 20, 2009 > pos gallstones > Lap chole 08/28/09  Low Back strain 02/16/10 ...................................................................Marland KitchenKritzer  - Radicular features Left began 8/2 sudden onset > rx prednisone dose pack March 05, 2010  - MRI ordered March 05, 2010>>> Pos for nerve compression  - NS eval scheduled for 03/10/10 > Laminectomy 03/27/10    Past Surgical History:   10/2003 status post total abdominal hysterectomy but still has ovaries  back surgery Dec 08 and 03/27/10  Lap chole 08/28/09               Objective:   Physical Exam    amb obese bf   11/04/2017       237   10/25/17 245 lb (111.1 kg)  10/11/17 241 lb 2.9 oz (109.4 kg)  09/24/17 241 lb 4.8 oz (109.5 kg)      Vital signs reviewed - Note on arrival 02 sats  96 % on RA    HEENT: nl dentition, turbinates bilaterally, and oropharynx. Nl external ear canals without cough reflex   NECK :  without JVD/Nodes/TM/ nl carotid upstrokes bilaterally   LUNGS: no acc muscle use,  Nl contour chest which is clear to A and P bilaterally without cough on insp or exp maneuvers   CV:  RRR  no s3 or murmur or increase in P2, and no edema   ABD:  soft and nontender with nl inspiratory excursion in the supine position. No bruits or organomegaly appreciated, bowel sounds nl  MS:  Nl gait/ ext warm without deformities, calf tenderness, cyanosis or clubbing No obvious joint restrictions   SKIN: warm and dry without lesions    NEURO:  alert, approp, nl sensorium with  no motor or cerebellar deficits apparent.        I personally reviewed images and agree with radiology impression as follows:  CXR:   09/23/17  No acute disease.  MRI sinus 10/11/17 wnl          Assessment & Plan:

## 2017-11-04 NOTE — Patient Instructions (Addendum)
Leave off advair for now and just use albuterol if needed for breathing  No change reflux medications / diet    Please schedule a follow up office visit in 4 weeks, sooner if needed  - allergy testing/ feno on return

## 2017-11-05 ENCOUNTER — Encounter: Payer: Self-pay | Admitting: Internal Medicine

## 2017-11-05 ENCOUNTER — Institutional Professional Consult (permissible substitution): Payer: Medicare Other | Admitting: Internal Medicine

## 2017-11-05 DIAGNOSIS — A084 Viral intestinal infection, unspecified: Secondary | ICD-10-CM | POA: Insufficient documentation

## 2017-11-05 NOTE — Assessment & Plan Note (Signed)
Resolving s intervention, no evidence of PMC > rec broth based soups, avoid dairy an fresh veg/salads f/u pcp prn

## 2017-11-05 NOTE — Assessment & Plan Note (Signed)
FENO 10/25/2017  =   19 on advair 250 bid > try off > improved 0/97/9499  Cyclical cough regimen 01/25/8208 >>> cough  resolved   She still senses "throat tickle" when goes outside but so far off advair >>  All goals of chronic asthma control met including optimal function and elimination of symptoms with minimal need for rescue therapy.  Contingencies discussed in full including contacting this office immediately if not controlling the symptoms using the rule of two's.     Each maintenance medication was reviewed in detail including most importantly the difference between maintenance and as needed and under what circumstances the prns are to be used.  Please see AVS for specific  Instructions which are unique to this visit and I personally typed out  which were reviewed in detail in writing with the patient and a copy provided.

## 2017-11-09 ENCOUNTER — Ambulatory Visit: Payer: Medicare Other | Admitting: Internal Medicine

## 2017-11-18 ENCOUNTER — Encounter: Payer: Self-pay | Admitting: Neurology

## 2017-11-30 ENCOUNTER — Other Ambulatory Visit: Payer: Self-pay | Admitting: Neurology

## 2017-12-01 ENCOUNTER — Other Ambulatory Visit: Payer: Self-pay | Admitting: Neurology

## 2017-12-03 ENCOUNTER — Ambulatory Visit: Payer: Medicare Other | Admitting: Internal Medicine

## 2017-12-03 ENCOUNTER — Telehealth: Payer: Self-pay | Admitting: Hematology

## 2017-12-03 NOTE — Telephone Encounter (Signed)
Printed ROI for [employer] on 12/03/17, Release ID [id number]

## 2017-12-07 ENCOUNTER — Ambulatory Visit: Payer: Medicare Other | Admitting: Neurology

## 2018-01-03 ENCOUNTER — Other Ambulatory Visit: Payer: Self-pay | Admitting: Nurse Practitioner

## 2018-01-03 DIAGNOSIS — Z1231 Encounter for screening mammogram for malignant neoplasm of breast: Secondary | ICD-10-CM

## 2018-01-29 ENCOUNTER — Other Ambulatory Visit: Payer: Self-pay | Admitting: Neurology

## 2018-02-01 ENCOUNTER — Encounter: Payer: Self-pay | Admitting: Neurology

## 2018-02-01 ENCOUNTER — Ambulatory Visit: Payer: Medicare Other | Admitting: Neurology

## 2018-02-01 VITALS — BP 102/78 | HR 66 | Ht 66.0 in | Wt 242.0 lb

## 2018-02-01 DIAGNOSIS — G43409 Hemiplegic migraine, not intractable, without status migrainosus: Secondary | ICD-10-CM

## 2018-02-01 DIAGNOSIS — F419 Anxiety disorder, unspecified: Secondary | ICD-10-CM

## 2018-02-01 DIAGNOSIS — F32A Depression, unspecified: Secondary | ICD-10-CM

## 2018-02-01 DIAGNOSIS — M797 Fibromyalgia: Secondary | ICD-10-CM

## 2018-02-01 DIAGNOSIS — F329 Major depressive disorder, single episode, unspecified: Secondary | ICD-10-CM

## 2018-02-01 MED ORDER — TOPIRAMATE ER 100 MG PO SPRINKLE CAP24: 100.0000 mg | EXTENDED_RELEASE_CAPSULE | Freq: Every day | ORAL | 0 refills | Status: DC

## 2018-02-01 MED ORDER — TOPIRAMATE ER 50 MG PO SPRINKLE CAP24: 50.0000 mg | EXTENDED_RELEASE_CAPSULE | Freq: Every day | ORAL | 0 refills | Status: DC

## 2018-02-01 NOTE — Progress Notes (Signed)
NEUROLOGY FOLLOW UP OFFICE NOTE  Wanda [name] [id number]  HISTORY OF PRESENT ILLNESS: Wanda [name] is a 52 year old right-handed woman with Paget's disease, osteoarthritis, fibromyalgia, GERD, IBS, hyperlipidemia, carotid artery disease and bilateral knee replacements who follows up for chronic migraine and hemiplegic migraine.   UPDATE: She had a hemiplegic migraine on 10/11/17 with left sided  Facial weakness as well as numbness and tingling of the left face, arm and leg.  She went to the ED as a stroke code.  MRI of brain was normal.  CT perfusion was negative.  CTA of head and neck showed mild right ICA origin soft and calcified plaque but no intracranial or extracranial large vessel stenosis or occlusion.  .    She has not been feeling well.  She has an exacerbation of her fibromyalgia and is experiencing pain and neck pain, which has been triggering headaches.  She is now using the cane again.  Her daughter will be moving out of the house soon.  This has caused a great deal of depression and anxiety which has also contributed to her pain and affecting her memory (forgetting names).  She has upcoming appointment with her therapist.  Intensity:  7-10/10 Duration:  Takes Thorazine and goes to sleep.  Wakes up feeling well.  Thorazine helps break the headache but she notes that the hemiplegic symptoms are worse whenever she takes it. Frequency:  2 days a week. Current NSAIDS:  no Current analgesics:  Hydrocodone (for pain related to Paget's) Current triptans:  no Current anti-emetic: Thorazine 25mg  (for abortive therapy),  Zofran 8mg  Current muscle relaxants:  Tizanidine 8mg  at bedtime Current Antihypertensive medications:  Zabeta Current Antidepressant medications:  sertraline 100mg  Current Anxiolytic:  BuSpar Current Anticonvulsant medications:  Trokendi XR 100mg  Current Vitamins/Herbal/Supplements:  ferrous sulfate Current Antihistamines/Decongestants:  No Other  therapy: dry needling   Caffeine:  No Alcohol:  no Smoker:  no Diet:  Hydrates, no fried foods or dairy, no sweets or soda Exercise:  Walks twice daily with the dog Depression/stress:  Affected by pain Sleep: fair   HISTORY:  On 10/28/11, she was hospitalized for severe left sided headache associated with slurred speech, left facial droop and left sided arm and leg weakness and numbness.  Headache lasted a day and weakness lasted a few days but she reports mild residual weakness.  MRI and MRA of head were personally reviewed and were negative for acute stroke.  Carotid doppler showed 40-59% bilateral ICA stenosis with antegrade flow in the vertebral arteries.  Echo showed EF 60% with no PFO or ASD.  She was diagnosed with TIA and started on ASA.  She has subsequently had several other similar episodes.  CT head and MRI brain from 06/02/12, CT head and MRI of brain from 10/28/12, CT head and MRI brain from 06/02/15, and MRI brain from 10/03/15 were all personally reviewed and were unremarkable.  MRI/MRA/MRV of head from 02/28/13 were unremarkable.  Carotid doppler from 06/26/16 showed minor carotid atherosclerosis but no hemodynamically significant stenosis.   She has left sided headache without the weakness as well.  It is severe and associated with nausea and photophobia.  They last 1 to 2 days. They occur 2 to 3 days per week.  She has 25 headache days per month. Triggers/exacerbating factors:  Dairy, cigarette smoke Relieving factors:  rest Activity:  Cannot function with severe headache.   Past NSAIDS:  Naproxen (GI upset), ibuprofen, Cambia (GI upset) Past analgesics:  Excedrin, Tylenol, Percocet (  hives), Norco, tramadol (hot flashes) Past abortive triptans:  Relpax Past muscle relaxants: no Past anti-emetic:  Phenergan 25mg  Past antihypertensive medications:  verapamil 24hr 180mg , HCTZ, lisinopril, atenolol 50mg  Past antidepressant medications:  nortriptyline 50mg  Past anticonvulsant medications:   Depakote, zonisamide, topiramate 100mg  twice daily (effective but caused memory problems and paresthesias), gabapentin 300mg  three times daily Past vitamins/Herbal/Supplements:  no Other past therapy:  Botox (2 rounds)   Family history of headache:  mother  PAST MEDICAL HISTORY: Past Medical History:  Diagnosis Date  . Acne   . Anemia    takes iron supplement  . Anxiety   . Asthma   . Chronic bronchitis ([place])   . Depression   . Family history of adverse reaction to anesthesia    pt's mother has hx. of post-op N/V  . Fibromyalgia   . GERD (gastroesophageal reflux disease)   . History of blood transfusion   . History of TIA (transient ischemic attack)    x 2; left-sided weakness  . Hypercholesterolemia    no current med.  . Hypertension    states under control with meds., has been on med. since 06/2011  . Keloid 11/9933   umbilical  . Migraines   . Osteoarthritis   . Osteoarthritis   . Paget disease of bone   . PONV (postoperative nausea and vomiting)    states severe  . Seasonal allergies   . Stroke ([place]) 10/27/2012   TIA   1- 10/29/11-weakness left side    MEDICATIONS: Current Outpatient Medications on File Prior to Visit  Medication Sig Dispense Refill  . Acetaminophen-Codeine (TYLENOL/CODEINE #3) 300-30 MG tablet Take 1 tablet by mouth every 4 (four) hours as needed for pain.    Marland Kitchen albuterol (PROAIR HFA) 108 (90 Base) MCG/ACT inhaler Inhale 2 puffs into the lungs every 6 (six) hours as needed for wheezing or shortness of breath.    Marland Kitchen albuterol (PROVENTIL) (5 MG/ML) 0.5% nebulizer solution Take 2.5 mg by nebulization every 6 (six) hours as needed for wheezing or shortness of breath.    Marland Kitchen aspirin EC 325 MG tablet Take 1 tablet (325 mg total) by mouth daily.    . Azelaic Acid (FINACEA EX) Apply 1 application topically daily.    . bisoprolol (ZEBETA) 5 MG tablet Take 1 tablet (5 mg total) by mouth daily. 30 tablet 11  . busPIRone (BUSPAR) 15 MG tablet Take 15 mg by mouth 2  (two) times daily.     . chlorproMAZINE (THORAZINE) 25 MG tablet TAKE 1 TABLET(25 MG) BY MOUTH THREE TIMES DAILY AS NEEDED 270 tablet 1  . diclofenac sodium (VOLTAREN) 1 % GEL Apply topically 4 (four) times daily.    Marland Kitchen doxycycline (VIBRA-TABS) 100 MG tablet Take 100 mg by mouth daily.    . ferrous sulfate 325 (65 FE) MG tablet Take 325 mg by mouth at bedtime.     . hydrochlorothiazide (MICROZIDE) 12.5 MG capsule Take 12.5 mg by mouth daily.    . mometasone (NASONEX) 50 MCG/ACT nasal spray Place 2 sprays into the nose daily.    . montelukast (SINGULAIR) 10 MG tablet Take 10 mg by mouth at bedtime.    . ondansetron (ZOFRAN) 8 MG tablet Take 8 mg by mouth every 8 (eight) hours as needed for nausea or vomiting.    . pantoprazole (PROTONIX) 40 MG tablet Take 40 mg by mouth 2 (two) times daily before a meal.     . rosuvastatin (CRESTOR) 10 MG tablet Take 10 mg by mouth daily.    Marland Kitchen  sertraline (ZOLOFT) 100 MG tablet Take 100-200 mg by mouth 2 (two) times daily. Take 100 mg in the am and 200 mg in the pm    . tiZANidine (ZANAFLEX) 4 MG tablet TAKE 2 TABLETS BY MOUTH AT BEDTIME AS NEEDED FOR MUSCLE SPASMS 180 tablet 0  . Topiramate ER (TROKENDI XR) 100 MG CP24 Take 1 capsule by mouth daily. 90 capsule 1  . Vitamin D, Ergocalciferol, (DRISDOL) 50000 UNITS CAPS Take 50,000 Units by mouth every 7 (seven) days. mondays     No current facility-administered medications on file prior to visit.     ALLERGIES: Allergies  Allergen Reactions  . Lisinopril Swelling and Other (See Comments)    SWELLING FACE/LIPS/NECK  . Latex Rash    FAMILY HISTORY: Family History  Problem Relation Age of Onset  . Anesthesia problems Mother        post-op N/V  . Anxiety disorder Father   . Depression Father   . Cervical cancer Maternal Grandmother   . Cancer Maternal Grandmother        mouth  . Heart attack Maternal Grandmother 55  . Hypertension Brother   . Heart failure Brother        ICD    SOCIAL  HISTORY: Social History   Socioeconomic History  . Marital status: Divorced    Spouse name: Not on file  . Number of children: 2  . Years of education: Not on file  . Highest education level: Not on file  Occupational History  . Occupation: Engineer, maintenance: UNEMPLOYED    Comment: [employer]  Social Needs  . Financial resource strain: Not on file  . Food insecurity:    Worry: Not on file    Inability: Not on file  . Transportation needs:    Medical: Not on file    Non-medical: Not on file  Tobacco Use  . Smoking status: Former Smoker    Packs/day: 0.50    Years: 15.00    Pack years: 7.50    Types: Cigarettes    Last attempt to quit: 07/20/2004    Years since quitting: 13.5  . Smokeless tobacco: Never Used  Substance and Sexual Activity  . Alcohol use: No  . Drug use: No  . Sexual activity: Yes    Partners: Male  Lifestyle  . Physical activity:    Days per week: Not on file    Minutes per session: Not on file  . Stress: Not on file  Relationships  . Social connections:    Talks on phone: Not on file    Gets together: Not on file    Attends religious service: Not on file    Active member of club or organization: Not on file    Attends meetings of clubs or organizations: Not on file    Relationship status: Not on file  . Intimate partner violence:    Fear of current or ex partner: Not on file    Emotionally abused: Not on file    Physically abused: Not on file    Forced sexual activity: Not on file  Other Topics Concern  . Not on file  Social History Narrative   Lives in [place] with daughter      Servando Snare (c) [phone]   nigel       (c) [phone]    REVIEW OF SYSTEMS: Constitutional: No fevers, chills, or sweats, no generalized fatigue, change in appetite Eyes: No visual changes, double vision, eye pain Ear,  nose and throat: No hearing loss, ear pain, nasal congestion, sore throat Cardiovascular: No chest pain, palpitations Respiratory:   No shortness of breath at rest or with exertion, wheezes GastrointestinaI: No nausea, vomiting, diarrhea, abdominal pain, fecal incontinence Genitourinary:  No dysuria, urinary retention or frequency Musculoskeletal:  Diffuse pain Integumentary: No rash, pruritus, skin lesions Neurological: as above Psychiatric: depression, insomnia, anxiety Endocrine: No palpitations, fatigue, diaphoresis, mood swings, change in appetite, change in weight, increased thirst Hematologic/Lymphatic:  No purpura, petechiae. Allergic/Immunologic: no itchy/runny eyes, nasal congestion, recent allergic reactions, rashes  PHYSICAL EXAM: Vitals:   02/01/18 1100  BP: 102/78  Pulse: 66  SpO2: 95%   General: Tearful but in no acute distress.  Patient appears well-groomed.   Head:  Normocephalic/atraumatic Eyes:  Fundi examined but not visualized Neck: supple, paraspinal tenderness, full range of motion Heart:  Regular rate and rhythm Lungs:  Clear to auscultation bilaterally Back: paraspinal tenderness Neurological Exam: alert and oriented to person, place, and time. Attention span and concentration intact, recent and remote memory intact, fund of knowledge intact.  Speech fluent and not dysarthric, language intact.  CN II-XII intact. Bulk and tone normal, muscle strength testing limited due to pain but appers 5-/5 left upper extremity, otherwise 5/5.  Sensation to temperature and vibration slightly decreased on left.  Deep tendon reflexes absent throughout, toes downgoing.  Finger to nose testing intact.  Antalgic gait.  IMPRESSION: Hemiplegic migraine, not intractable.  She has trace residual weakness in the left arm and hand and endorses numbness on the left as well.  No structural explanation on MRI.  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 Depression and anxiety Fibromyalgia  PLAN: 1.  Even though the Thorazine helps with the headache, she reports that it exacerbates her hemiplegic  symptoms.  Therefore she will discontinue it and instead try Sprix nasal spray 2.  Increase topiramate ER to 150mg  at bedtime 3.  Aware that limiting pain relievers to no more than 2 days a week will help prevent rebound headache 4.  Headache diary 5.  Follow up with psychotherapist. 6.  Follow up in 4 months.  Metta Clines, DO  CC: Dr. Zadie Rhine

## 2018-02-01 NOTE — Patient Instructions (Signed)
1.  We will increase the extended release topiramate to 150mg  at bedtime 2.  Stop the thorazine.  When you get a migraine, use the Sprix nasal spray, 1 spray in each nostril. May repeat every 6 to 8 hours as needed up to total of 4 times a day. 3.  Follow up in 4 months.

## 2018-02-02 ENCOUNTER — Ambulatory Visit
Admission: RE | Admit: 2018-02-02 | Discharge: 2018-02-02 | Disposition: A | Discharge status: Home / Self Care | Payer: Medicare Other | Source: Ambulatory Visit | Attending: Nurse Practitioner | Admitting: Nurse Practitioner

## 2018-02-02 ENCOUNTER — Ambulatory Visit: Payer: Medicare Other

## 2018-02-02 DIAGNOSIS — Z1231 Encounter for screening mammogram for malignant neoplasm of breast: Secondary | ICD-10-CM

## 2018-02-07 ENCOUNTER — Telehealth: Payer: Self-pay | Admitting: Neurology

## 2018-02-07 NOTE — Telephone Encounter (Signed)
Patient states that the toradol? Medication is not covered and will need a prior Josem Kaufmann  [phone] is the phone number she gave me

## 2018-02-11 ENCOUNTER — Other Ambulatory Visit (HOSPITAL_COMMUNITY): Payer: Self-pay | Admitting: Orthopedic Surgery

## 2018-02-11 DIAGNOSIS — G8929 Other chronic pain: Secondary | ICD-10-CM

## 2018-02-11 DIAGNOSIS — M25561 Pain in right knee: Principal | ICD-10-CM

## 2018-02-11 DIAGNOSIS — Z96651 Presence of right artificial knee joint: Secondary | ICD-10-CM

## 2018-02-17 ENCOUNTER — Ambulatory Visit (HOSPITAL_COMMUNITY): Payer: Medicare Other

## 2018-02-22 ENCOUNTER — Ambulatory Visit (HOSPITAL_COMMUNITY)
Admission: RE | Admit: 2018-02-22 | Discharge: 2018-02-22 | Disposition: A | Discharge status: Home / Self Care | Payer: Medicare Other | Source: Ambulatory Visit | Attending: Orthopedic Surgery | Admitting: Orthopedic Surgery

## 2018-02-22 ENCOUNTER — Encounter (HOSPITAL_COMMUNITY)
Admission: RE | Admit: 2018-02-22 | Discharge: 2018-02-22 | Disposition: A | Discharge status: Home / Self Care | Payer: Medicare Other | Source: Ambulatory Visit | Attending: Orthopedic Surgery | Admitting: Orthopedic Surgery

## 2018-02-22 DIAGNOSIS — R9389 Abnormal findings on diagnostic imaging of other specified body structures: Secondary | ICD-10-CM | POA: Insufficient documentation

## 2018-02-22 DIAGNOSIS — M25561 Pain in right knee: Secondary | ICD-10-CM | POA: Insufficient documentation

## 2018-02-22 DIAGNOSIS — G8929 Other chronic pain: Secondary | ICD-10-CM | POA: Insufficient documentation

## 2018-02-22 DIAGNOSIS — Z96651 Presence of right artificial knee joint: Secondary | ICD-10-CM | POA: Diagnosis not present

## 2018-02-22 MED ORDER — TECHNETIUM TC 99M MEDRONATE IV KIT
20.0000 | PACK | Freq: Once | INTRAVENOUS | Status: AC | PRN
  Administered 2018-02-22: 20 via INTRAVENOUS

## 2018-02-26 ENCOUNTER — Other Ambulatory Visit: Payer: Self-pay | Admitting: Neurology

## 2018-02-26 DIAGNOSIS — G43409 Hemiplegic migraine, not intractable, without status migrainosus: Secondary | ICD-10-CM

## 2018-02-28 MED ORDER — TOPIRAMATE ER 50 MG PO SPRINKLE CAP24: 50.0000 mg | EXTENDED_RELEASE_CAPSULE | Freq: Every day | ORAL | 1 refills | Status: DC

## 2018-02-28 MED ORDER — TOPIRAMATE ER 100 MG PO SPRINKLE CAP24: 100.0000 mg | EXTENDED_RELEASE_CAPSULE | Freq: Every day | ORAL | 1 refills | Status: DC

## 2018-02-28 MED ORDER — TIZANIDINE HCL 4 MG PO TABS: ORAL_TABLET | ORAL | 1 refills | Status: DC

## 2018-03-01 NOTE — Telephone Encounter (Signed)
Patient is still waiting on a call about the toradol mist medication. She states that it needs a prior auth and has not heard anything from our office

## 2018-03-02 ENCOUNTER — Telehealth: Payer: Self-pay | Admitting: Neurology

## 2018-03-02 NOTE — Telephone Encounter (Signed)
Patient is wanting to get more samples of the Topiramate 50mg  and 100mg  samples until her prescription is refilled at the pharmacy. Please call her [phone]. Thanks.

## 2018-03-02 NOTE — Telephone Encounter (Signed)
Called Pt, advised her samples are at the front desk Trokendi 100 mg #14 YOY#[id number] Trokendi 50mg  #14  [id number]

## 2018-03-03 NOTE — Telephone Encounter (Signed)
Rcvd fax fom [facility] apprvd [phone] thru 07/26/18 [facility], spoke with Jeannene Patella  Called Pt to let her know, LMOVM, copay will be $8.50

## 2018-03-12 ENCOUNTER — Other Ambulatory Visit: Payer: Self-pay | Admitting: Family Medicine

## 2018-03-12 DIAGNOSIS — M5136 Other intervertebral disc degeneration, lumbar region: Secondary | ICD-10-CM

## 2018-03-22 ENCOUNTER — Telehealth: Payer: Self-pay | Admitting: Neurology

## 2018-03-22 MED ORDER — TOPIRAMATE ER 100 MG PO CAP24: 100.0000 mg | ORAL_CAPSULE | Freq: Every day | ORAL | 0 refills | Status: DC

## 2018-03-22 MED ORDER — TOPIRAMATE ER 100 MG PO CAP24: 100.0000 mg | ORAL_CAPSULE | Freq: Every day | ORAL | 3 refills | Status: DC

## 2018-03-22 MED ORDER — TOPIRAMATE ER 50 MG PO CAP24: 50.0000 mg | ORAL_CAPSULE | Freq: Every day | ORAL | 0 refills | Status: DC

## 2018-03-22 MED ORDER — TOPIRAMATE ER 50 MG PO CAP24: 50.0000 mg | ORAL_CAPSULE | Freq: Every day | ORAL | 3 refills | Status: DC

## 2018-03-22 NOTE — Telephone Encounter (Signed)
Sent in Rx's, called Pt, advised her and that I have put a weeks worth of samples at the front desk. She will come tomorrow and p/u

## 2018-03-22 NOTE — Telephone Encounter (Signed)
Patient states that she is out of both trokendi 100 and trokendi 50. She would liek [place] to see if we have samples and also if we can call in the a RX for both to the [facility] on [address]

## 2018-03-22 NOTE — Addendum Note (Signed)
Addended by: Clois Comber on: 03/22/2018 04:39 PM   Modules accepted: Orders

## 2018-04-01 ENCOUNTER — Telehealth: Payer: Self-pay | Admitting: Neurology

## 2018-04-01 NOTE — Telephone Encounter (Signed)
Patient called and left a voicemail stating that there was some paperwork that she dropped off at last appointment that needed to be completed and faxed.She wanted to check the status of this. Please call her back at [phone]. Thanks!

## 2018-04-05 NOTE — Telephone Encounter (Signed)
Spoke with Pt, advised her we did not take her out of work and we do not fill out [employer].

## 2018-04-20 ENCOUNTER — Other Ambulatory Visit: Payer: Self-pay | Admitting: Neurosurgery

## 2018-04-20 DIAGNOSIS — M5440 Lumbago with sciatica, unspecified side: Secondary | ICD-10-CM

## 2018-04-27 ENCOUNTER — Ambulatory Visit
Admission: RE | Admit: 2018-04-27 | Discharge: 2018-04-27 | Disposition: A | Discharge status: Home / Self Care | Payer: Medicare Other | Source: Ambulatory Visit | Attending: Neurosurgery | Admitting: Neurosurgery

## 2018-04-27 DIAGNOSIS — M5440 Lumbago with sciatica, unspecified side: Secondary | ICD-10-CM

## 2018-06-08 NOTE — Progress Notes (Signed)
NEUROLOGY FOLLOW UP OFFICE NOTE  Wanda [name] [id number]  HISTORY OF PRESENT ILLNESS: Wanda [name] is a 52 year old right-handed woman with Paget's disease, osteoarthritis, fibromyalgia, GERD, IBS, hyperlipidemia, carotid artery disease and bilateral knee replacements who follows up for migraine and hemiplegic migraine.  UPDATE: Still with nagging headache everyday.   Intensity:  Usually moderate (twice over past 5 weeks she had a severe headache which did not abort easily with Sprix) Duration:  Usually 1-2 hours Frequency:  1 to 2 days a week Frequency of abortive medication: 1 to 2 days a week Rescue:   Current NSAIDS: Sprix nasal spray Current analgesics: Hydrocodone (for pain related to Paget's) Current triptans: None Current ergotamine: None Current anti-emetic: Zofran 8 mg Current muscle relaxants: Tizanidine 8 mg at bedtime Current anti-anxiolytic: BuSpar Current sleep aide:  none Current Antihypertensive medications: Zepeta Current Antidepressant medications: Sertraline 100 mg Current Anticonvulsant medications: Trokendi XR 150 mg Current anti-CGRP:  none Current Vitamins/Herbal/Supplements: Ferrous sulfate Current Antihistamines/Decongestants: None Other therapy: Dry needling Hormone: None  Caffeine: No Alcohol: No Smoker: No Diet: Hydrates, no fried foods or dairy, no sweets or soda Exercise: Walks twice daily with her dog Depression: Yes; Anxiety: Yes (affected by pain) Other pain: Pain related to fibromyalgia Sleep hygiene: Fair  HISTORY:  On 10/28/2011, she was hospitalized for severe left-sided headache associated with slurred speech, left facial droop and left sided arm and leg weakness and numbness.  Headache lasted a day and weakness lasted a few days but she reports mild residual weakness.  MRI and MRA of head were personally reviewed and were negative for acute stroke.  Carotid doppler showed 40-59% bilateral ICA stenosis with antegrade flow  in the vertebral arteries.  Echo showed EF 60% with no PFO or ASD.  She was diagnosed with TIA and started on ASA.  She has subsequently had several other similar episodes.  CT head and MRI brain from 06/02/12, CT head and MRI of brain from 10/28/12, CT head and MRI brain from 06/02/15, and MRI brain from 10/03/15 were all personally reviewed and were unremarkable.  MRI/MRA/MRV of head from 02/28/13 were unremarkable.  Carotid doppler from 06/26/16 showed minor carotid atherosclerosis but no hemodynamically significant stenosis.  She has left sided throbbing headache without the weakness as well.  It is severe and associated with nausea and photophobia.  They last 1 to 2 days. They occur 2 to 3 days per week.  She has 25 headache days per month. Triggers/exacerbating factors:  Dairy, cigarette smoke Relieving factors:  rest Activity:  Cannot function with severe headache.  She had a hemiplegic migraine on 10/11/17 with left sided  Facial weakness as well as numbness and tingling of the left face, arm and leg.  She went to the ED as a stroke code.  MRI of brain was normal.  CT perfusion was negative.  CTA of head and neck showed mild right ICA origin soft and calcified plaque but no intracranial or extracranial large vessel stenosis or occlusion.  Past NSAIDS:  Naproxen (GI upset), ibuprofen, Cambia (GI upset) Past analgesics:  Excedrin, Tylenol, Percocet (hives), Norco, tramadol (hot flashes) Past abortive triptans:  Relpax Past muscle relaxants: no Past anti-emetic:  Phenergan 25mg , Thorazine (effective for migraine abortive therapy but exacerbated hemiplegic migraines) Past antihypertensive medications:  verapamil 24hr 180mg , HCTZ, lisinopril, atenolol 50mg  Past antidepressant medications:  nortriptyline 50mg  Past anticonvulsant medications:  Depakote, zonisamide, topiramate 100mg  twice daily (effective but caused memory problems and paresthesias), gabapentin 300mg  three times daily Past  anti-CGRP:   None Past vitamins/Herbal/Supplements:  no Other past therapy:  Botox (2 rounds)  Family history of headache:  mother  PAST MEDICAL HISTORY: Past Medical History:  Diagnosis Date  . Acne   . Anemia    takes iron supplement  . Anxiety   . Asthma   . Chronic bronchitis ([place])   . Depression   . Family history of adverse reaction to anesthesia    pt's mother has hx. of post-op N/V  . Fibromyalgia   . GERD (gastroesophageal reflux disease)   . History of blood transfusion   . History of TIA (transient ischemic attack)    x 2; left-sided weakness  . Hypercholesterolemia    no current med.  . Hypertension    states under control with meds., has been on med. since 06/2011  . Keloid 03/6294   umbilical  . Migraines   . Osteoarthritis   . Osteoarthritis   . Paget disease of bone   . PONV (postoperative nausea and vomiting)    states severe  . Seasonal allergies   . Stroke ([place]) 10/27/2012   TIA   1- 10/29/11-weakness left side    MEDICATIONS: Current Outpatient Medications on File Prior to Visit  Medication Sig Dispense Refill  . Acetaminophen-Codeine (TYLENOL/CODEINE #3) 300-30 MG tablet Take 1 tablet by mouth every 4 (four) hours as needed for pain.    Marland Kitchen albuterol (PROAIR HFA) 108 (90 Base) MCG/ACT inhaler Inhale 2 puffs into the lungs every 6 (six) hours as needed for wheezing or shortness of breath.    Marland Kitchen albuterol (PROVENTIL) (5 MG/ML) 0.5% nebulizer solution Take 2.5 mg by nebulization every 6 (six) hours as needed for wheezing or shortness of breath.    Marland Kitchen aspirin EC 325 MG tablet Take 1 tablet (325 mg total) by mouth daily.    . Azelaic Acid (FINACEA EX) Apply 1 application topically daily.    . bisoprolol (ZEBETA) 5 MG tablet Take 1 tablet (5 mg total) by mouth daily. 30 tablet 11  . busPIRone (BUSPAR) 15 MG tablet Take 15 mg by mouth 2 (two) times daily.     . diclofenac sodium (VOLTAREN) 1 % GEL Apply topically 4 (four) times daily.    Marland Kitchen doxycycline (VIBRA-TABS) 100 MG  tablet Take 100 mg by mouth daily.    . ferrous sulfate 325 (65 FE) MG tablet Take 325 mg by mouth at bedtime.     . hydrochlorothiazide (MICROZIDE) 12.5 MG capsule Take 12.5 mg by mouth daily.    . mometasone (NASONEX) 50 MCG/ACT nasal spray Place 2 sprays into the nose daily.    . montelukast (SINGULAIR) 10 MG tablet Take 10 mg by mouth at bedtime.    . ondansetron (ZOFRAN) 8 MG tablet Take 8 mg by mouth every 8 (eight) hours as needed for nausea or vomiting.    . pantoprazole (PROTONIX) 40 MG tablet Take 40 mg by mouth 2 (two) times daily before a meal.     . rosuvastatin (CRESTOR) 10 MG tablet Take 10 mg by mouth daily.    . sertraline (ZOLOFT) 100 MG tablet Take 100-200 mg by mouth 2 (two) times daily. Take 100 mg in the am and 200 mg in the pm    . tiZANidine (ZANAFLEX) 4 MG tablet TAKE 2 TABLETS BY MOUTH AT BEDTIME AS NEEDED FOR MUSCLE SPASMS 180 tablet 1  . topiramate ER (QUDEXY XR) 50 MG CS24 sprinkle capsule Take 1 capsule (50 mg total) by mouth daily. 90 each 1  .  topiramate ER (QUDEXY XR) CS24 sprinkle capsule Take 1 capsule (100 mg total) by mouth daily. 90 each 1  . Topiramate ER (TROKENDI XR) 100 MG CP24 Take 1 capsule by mouth daily. 90 capsule 1  . Topiramate ER (TROKENDI XR) 100 MG CP24 Take 100 mg by mouth at bedtime. 30 capsule 3  . Topiramate ER (TROKENDI XR) 100 MG CP24 Take 100 mg by mouth at bedtime. 30 capsule 0  . Topiramate ER (TROKENDI XR) 50 MG CP24 Take 50 mg by mouth at bedtime. 30 capsule 3  . Topiramate ER (TROKENDI XR) 50 MG CP24 Take 50 mg by mouth at bedtime. 30 capsule 0  . Vitamin D, Ergocalciferol, (DRISDOL) 50000 UNITS CAPS Take 50,000 Units by mouth every 7 (seven) days. mondays     No current facility-administered medications on file prior to visit.     ALLERGIES: Allergies  Allergen Reactions  . Lisinopril Swelling and Other (See Comments)    SWELLING FACE/LIPS/NECK  . Latex Rash    FAMILY HISTORY: Family History  Problem Relation Age of  Onset  . Anesthesia problems Mother        post-op N/V  . Anxiety disorder Father   . Depression Father   . Cervical cancer Maternal Grandmother   . Cancer Maternal Grandmother        mouth  . Heart attack Maternal Grandmother 55  . Hypertension Brother   . Heart failure Brother        ICD   SOCIAL HISTORY: Social History   Socioeconomic History  . Marital status: Divorced    Spouse name: Not on file  . Number of children: 2  . Years of education: Not on file  . Highest education level: Not on file  Occupational History  . Occupation: Engineer, maintenance: UNEMPLOYED    Comment: [employer]  Social Needs  . Financial resource strain: Not on file  . Food insecurity:    Worry: Not on file    Inability: Not on file  . Transportation needs:    Medical: Not on file    Non-medical: Not on file  Tobacco Use  . Smoking status: Former Smoker    Packs/day: 0.50    Years: 15.00    Pack years: 7.50    Types: Cigarettes    Last attempt to quit: 07/20/2004    Years since quitting: 13.8  . Smokeless tobacco: Never Used  Substance and Sexual Activity  . Alcohol use: No  . Drug use: No  . Sexual activity: Yes    Partners: Male  Lifestyle  . Physical activity:    Days per week: Not on file    Minutes per session: Not on file  . Stress: Not on file  Relationships  . Social connections:    Talks on phone: Not on file    Gets together: Not on file    Attends religious service: Not on file    Active member of club or organization: Not on file    Attends meetings of clubs or organizations: Not on file    Relationship status: Not on file  . Intimate partner violence:    Fear of current or ex partner: Not on file    Emotionally abused: Not on file    Physically abused: Not on file    Forced sexual activity: Not on file  Other Topics Concern  . Not on file  Social History Narrative   Lives in [place] with daughter  Servando Snare (c) [phone]   nigel       (c)  [phone]    REVIEW OF SYSTEMS: Constitutional: No fevers, chills, or sweats, no generalized fatigue, change in appetite Eyes: No visual changes, double vision, eye pain Ear, nose and throat: No hearing loss, ear pain, nasal congestion, sore throat Cardiovascular: No chest pain, palpitations Respiratory:  No shortness of breath at rest or with exertion, wheezes GastrointestinaI: No nausea, vomiting, diarrhea, abdominal pain, fecal incontinence Genitourinary:  No dysuria, urinary retention or frequency Musculoskeletal:  Right knee pain Integumentary: No rash, pruritus, skin lesions Neurological: as above Psychiatric: Depression, anxiety Endocrine: No palpitations, fatigue, diaphoresis, mood swings, change in appetite, change in weight, increased thirst Hematologic/Lymphatic:  No purpura, petechiae. Allergic/Immunologic: no itchy/runny eyes, nasal congestion, recent allergic reactions, rashes  PHYSICAL EXAM: Blood pressure 96/80, pulse 79, height 5\' 6"  (1.676 m), weight 238 lb (108 kg), last menstrual period 10/14/2002, SpO2 98 %. General: No acute distress.  Patient appears well-groomed.  Head:  Normocephalic/atraumatic Eyes:  Fundi examined but not visualized Neck: supple, no paraspinal tenderness, full range of motion Heart:  Regular rate and rhythm Lungs:  Clear to auscultation bilaterally Back: No paraspinal tenderness Neurological Exam: alert and oriented to person, place, and time. Attention span and concentration intact, recent and remote memory intact, fund of knowledge intact.  Speech fluent and not dysarthric, language intact.  CN II-XII intact. Bulk and tone normal, muscle strength 5-/5 right hip flexion (reduced effort due to pain). Otherwise, 5/5 throughout.  Sensation to light touch intact.  Deep tendon reflexes 2+ throughout, toes downgoing.  Finger to nose testing intact.  Gait antalgic   IMPRESSION: 1.  Hemiplegic migraine, not intractable 2.  Chronic  migraine  PLAN: 1.  For preventative management, we will start Emgality.  She will continue Trokendi XR 150mg  daily 2.  For abortive therapy, continue Sprix NS 3.  Limit use of pain relievers to no more than 2 days out of week to prevent risk of rebound or medication-overuse headache. 4.  Keep headache diary 5.  Exercise, hydration, caffeine cessation, sleep hygiene, monitor for and avoid triggers 6.  Consider:  magnesium citrate 400mg  daily, riboflavin 400mg  daily, and coenzyme Q10 100mg  three times daily 7.  Follow up in 4 months.  Metta Clines, DO  CC: Milagros Evener, MD

## 2018-06-09 ENCOUNTER — Encounter: Payer: Self-pay | Admitting: Neurology

## 2018-06-09 ENCOUNTER — Ambulatory Visit (INDEPENDENT_AMBULATORY_CARE_PROVIDER_SITE_OTHER): Payer: Medicare Other | Admitting: Neurology

## 2018-06-09 ENCOUNTER — Telehealth: Payer: Self-pay

## 2018-06-09 VITALS — BP 96/80 | HR 79 | Ht 66.0 in | Wt 238.0 lb

## 2018-06-09 DIAGNOSIS — IMO0002 Reserved for concepts with insufficient information to code with codable children: Secondary | ICD-10-CM

## 2018-06-09 DIAGNOSIS — G43709 Chronic migraine without aura, not intractable, without status migrainosus: Secondary | ICD-10-CM | POA: Diagnosis not present

## 2018-06-09 DIAGNOSIS — G43409 Hemiplegic migraine, not intractable, without status migrainosus: Secondary | ICD-10-CM | POA: Diagnosis not present

## 2018-06-09 MED ORDER — GALCANEZUMAB-GNLM 120 MG/ML ~~LOC~~ SOAJ: 240.0000 mg | Freq: Once | SUBCUTANEOUS | 0 refills | Status: AC

## 2018-06-09 MED ORDER — GALCANEZUMAB-GNLM 120 MG/ML ~~LOC~~ SOSY: 120.0000 mg | PREFILLED_SYRINGE | SUBCUTANEOUS | 11 refills | Status: DC

## 2018-06-09 NOTE — Telephone Encounter (Signed)
PA initiated for  Emgality 120MG /ML auto-injectors   Via [email or web address]

## 2018-06-09 NOTE — Patient Instructions (Addendum)
1.  Continue Trokendi XR 150mg  daily.  We will start Emgality 2.  Use Sprix nasal spray as needed 3.  Limit use of pain relievers to no more than 2 days out of week to prevent risk of rebound or medication-overuse headache. 4.  Keep headache diary 5.  Follow up in 4 months.

## 2018-06-10 ENCOUNTER — Telehealth: Payer: Self-pay | Admitting: Neurology

## 2018-06-10 ENCOUNTER — Other Ambulatory Visit: Payer: Self-pay | Admitting: Orthopedic Surgery

## 2018-06-10 ENCOUNTER — Other Ambulatory Visit: Payer: Self-pay | Admitting: Family Medicine

## 2018-06-10 DIAGNOSIS — G43709 Chronic migraine without aura, not intractable, without status migrainosus: Secondary | ICD-10-CM

## 2018-06-10 DIAGNOSIS — M5136 Other intervertebral disc degeneration, lumbar region: Secondary | ICD-10-CM

## 2018-06-10 DIAGNOSIS — IMO0002 Reserved for concepts with insufficient information to code with codable children: Secondary | ICD-10-CM

## 2018-06-10 MED ORDER — ERENUMAB-AOOE 70 MG/ML ~~LOC~~ SOAJ: 70.0000 mg | SUBCUTANEOUS | 11 refills | Status: DC

## 2018-06-10 NOTE — Telephone Encounter (Signed)
Called and spoke with Pt.  Per message from Lenny Pastel, Emgality has been denied, Pt must first try Aimovig. Rx for Aimovig sent in to her pharmacy

## 2018-06-10 NOTE — Telephone Encounter (Signed)
Patient states that she spoke with her  [facility] would not approve her medication and that she would need to try something else first she left aVM please call

## 2018-06-10 NOTE — Telephone Encounter (Signed)
Received notice via [email or web address] that pt's AIMOVIG INJ 70mg /mL has been APPROVED  06/10/18 thru 09/10/2018

## 2018-06-10 NOTE — Progress Notes (Signed)
Aimovig apprvd 06/10/18 - 09/10/18

## 2018-06-10 NOTE — Telephone Encounter (Signed)
Received notice via [email or web address] that pt's EMGALITY has been DENIED stating that pt is required to try and fail Aimovig first.

## 2018-06-13 ENCOUNTER — Other Ambulatory Visit: Payer: Self-pay | Admitting: Orthopedic Surgery

## 2018-06-21 NOTE — Patient Instructions (Addendum)
TARAE WOODEN  06/21/2018   Your procedure is scheduled on: 06-27-18   Report to [facility] Main  Entrance    Report to admitting at 5:30AM    Call this number if you have problems the morning of surgery [phone]     Remember: Do not eat food or drink liquids :After Midnight. BRUSH YOUR TEETH MORNING OF SURGERY AND RINSE YOUR MOUTH OUT, NO CHEWING GUM CANDY OR MINTS.     Take these medicines the morning of surgery with A SIP OF WATER: Bisoprolol, buspar, sertraline, pantoprazole, proair inhaler if needed, proventil inhaler, nasal spray, rosuvastatin                                 You may not have any metal on your body including hair pins and              piercings  Do not wear jewelry, make-up, lotions, powders or perfumes, deodorant             Do not wear nail polish.  Do not shave  48 hours prior to surgery.     Do not bring valuables to the hospital. [place].  Contacts, dentures or bridgework may not be worn into surgery.  Leave suitcase in the car. After surgery it may be brought to your room.                  Please read over the following fact sheets you were given: _____________________________________________________________________             [facility] - Preparing for Surgery Before surgery, you can play an important role.  Because skin is not sterile, your skin needs to be as free of germs as possible.  You can reduce the number of germs on your skin by washing with CHG (chlorahexidine gluconate) soap before surgery.  CHG is an antiseptic cleaner which kills germs and bonds with the skin to continue killing germs even after washing. Please DO NOT use if you have an allergy to CHG or antibacterial soaps.  If your skin becomes reddened/irritated stop using the CHG and inform your nurse when you arrive at Short Stay. Do not shave (including legs and underarms) for at least 48  hours prior to the first CHG shower.  You may shave your face/neck. Please follow these instructions carefully:  1.  Shower with CHG Soap the night before surgery and the  morning of Surgery.  2.  If you choose to wash your hair, wash your hair first as usual with your  normal  shampoo.  3.  After you shampoo, rinse your hair and body thoroughly to remove the  shampoo.                           4.  Use CHG as you would any other liquid soap.  You can apply chg directly  to the skin and wash                       Gently with a scrungie or clean washcloth.  5.  Apply the CHG Soap to your body ONLY FROM THE NECK DOWN.  Do not use on face/ open                           Wound or open sores. Avoid contact with eyes, ears mouth and genitals (private parts).                       Wash face,  Genitals (private parts) with your normal soap.             6.  Wash thoroughly, paying special attention to the area where your surgery  will be performed.  7.  Thoroughly rinse your body with warm water from the neck down.  8.  DO NOT shower/wash with your normal soap after using and rinsing off  the CHG Soap.                9.  Pat yourself dry with a clean towel.            10.  Wear clean pajamas.            11.  Place clean sheets on your bed the night of your first shower and do not  sleep with pets. Day of Surgery : Do not apply any lotions/deodorants the morning of surgery.  Please wear clean clothes to the hospital/surgery center.  FAILURE TO FOLLOW THESE INSTRUCTIONS MAY RESULT IN THE CANCELLATION OF YOUR SURGERY PATIENT SIGNATURE_________________________________  NURSE SIGNATURE__________________________________  ________________________________________________________________________   Adam Phenix  An incentive spirometer is a tool that can help keep your lungs clear and active. This tool measures how well you are filling your lungs with each breath. Taking long deep breaths may help  reverse or decrease the chance of developing breathing (pulmonary) problems (especially infection) following:  A long period of time when you are unable to move or be active. BEFORE THE PROCEDURE   If the spirometer includes an indicator to show your best effort, your nurse or respiratory therapist will set it to a desired goal.  If possible, sit up straight or lean slightly forward. Try not to slouch.  Hold the incentive spirometer in an upright position. INSTRUCTIONS FOR USE  1. Sit on the edge of your bed if possible, or sit up as far as you can in bed or on a chair. 2. Hold the incentive spirometer in an upright position. 3. Breathe out normally. 4. Place the mouthpiece in your mouth and seal your lips tightly around it. 5. Breathe in slowly and as deeply as possible, raising the piston or the ball toward the top of the column. 6. Hold your breath for 3-5 seconds or for as long as possible. Allow the piston or ball to fall to the bottom of the column. 7. Remove the mouthpiece from your mouth and breathe out normally. 8. Rest for a few seconds and repeat Steps 1 through 7 at least 10 times every 1-2 hours when you are awake. Take your time and take a few normal breaths between deep breaths. 9. The spirometer may include an indicator to show your best effort. Use the indicator as a goal to work toward during each repetition. 10. After each set of 10 deep breaths, practice coughing to be sure your lungs are clear. If you have an incision (the cut made at the time of surgery), support your incision when coughing by placing a pillow or rolled up towels firmly against it. Once you are able to get out of  bed, walk around indoors and cough well. You may stop using the incentive spirometer when instructed by your caregiver.  RISKS AND COMPLICATIONS  Take your time so you do not get dizzy or light-headed.  If you are in pain, you may need to take or ask for pain medication before doing incentive  spirometry. It is harder to take a deep breath if you are having pain. AFTER USE  Rest and breathe slowly and easily.  It can be helpful to keep track of a log of your progress. Your caregiver can provide you with a simple table to help with this. If you are using the spirometer at home, follow these instructions: Malcolm IF:   You are having difficultly using the spirometer.  You have trouble using the spirometer as often as instructed.  Your pain medication is not giving enough relief while using the spirometer.  You develop fever of 100.5 F (38.1 C) or higher. SEEK IMMEDIATE MEDICAL CARE IF:   You cough up bloody sputum that had not been present before.  You develop fever of 102 F (38.9 C) or greater.  You develop worsening pain at or near the incision site. MAKE SURE YOU:   Understand these instructions.  Will watch your condition.  Will get help right away if you are not doing well or get worse. Document Released: 11/23/2006 Document Revised: 10/05/2011 Document Reviewed: 01/24/2007 [facility] Patient Information 2014 [place], [place].   ________________________________________________________________________

## 2018-06-21 NOTE — Progress Notes (Signed)
lov neuro Dr Tomi Likens 06-09-18 epic   ekg 10-11-17 epic   cxr 09-23-17 epic

## 2018-06-22 ENCOUNTER — Encounter (HOSPITAL_COMMUNITY)
Admission: RE | Admit: 2018-06-22 | Discharge: 2018-06-22 | Disposition: A | Discharge status: Home / Self Care | Payer: Medicare Other | Source: Ambulatory Visit | Attending: Orthopedic Surgery | Admitting: Orthopedic Surgery

## 2018-06-22 ENCOUNTER — Other Ambulatory Visit: Payer: Self-pay

## 2018-06-22 ENCOUNTER — Encounter (HOSPITAL_COMMUNITY): Payer: Self-pay

## 2018-06-22 DIAGNOSIS — T8484XA Pain due to internal orthopedic prosthetic devices, implants and grafts, initial encounter: Secondary | ICD-10-CM | POA: Diagnosis present

## 2018-06-22 DIAGNOSIS — Z01812 Encounter for preprocedural laboratory examination: Secondary | ICD-10-CM | POA: Diagnosis present

## 2018-06-22 DIAGNOSIS — M2341 Loose body in knee, right knee: Secondary | ICD-10-CM | POA: Insufficient documentation

## 2018-06-22 DIAGNOSIS — Z96651 Presence of right artificial knee joint: Secondary | ICD-10-CM

## 2018-06-22 LAB — CBC WITH DIFFERENTIAL/PLATELET
Abs Immature Granulocytes: 0.02 10*3/uL (ref 0.00–0.07)
BASOS ABS: 0 10*3/uL (ref 0.0–0.1)
BASOS PCT: 1 %
EOS ABS: 0.2 10*3/uL (ref 0.0–0.5)
EOS PCT: 3 %
HCT: 38.4 % (ref 36.0–46.0)
HEMOGLOBIN: 12.2 g/dL (ref 12.0–15.0)
Immature Granulocytes: 0 %
LYMPHS PCT: 28 %
Lymphs Abs: 1.8 10*3/uL (ref 0.7–4.0)
MCH: 28.9 pg (ref 26.0–34.0)
MCHC: 31.8 g/dL (ref 30.0–36.0)
MCV: 91 fL (ref 80.0–100.0)
Monocytes Absolute: 0.4 10*3/uL (ref 0.1–1.0)
Monocytes Relative: 6 %
NRBC: 0 % (ref 0.0–0.2)
Neutro Abs: 4.1 10*3/uL (ref 1.7–7.7)
Neutrophils Relative %: 62 %
PLATELETS: 168 10*3/uL (ref 150–400)
RBC: 4.22 MIL/uL (ref 3.87–5.11)
RDW: 13.7 % (ref 11.5–15.5)
WBC: 6.6 10*3/uL (ref 4.0–10.5)

## 2018-06-22 LAB — PROTIME-INR
INR: 1.02
PROTHROMBIN TIME: 13.3 s (ref 11.4–15.2)

## 2018-06-22 LAB — SURGICAL PCR SCREEN
MRSA, PCR: NEGATIVE
Staphylococcus aureus: NEGATIVE

## 2018-06-22 LAB — BASIC METABOLIC PANEL
Anion gap: 7 (ref 5–15)
BUN: 13 mg/dL (ref 6–20)
CHLORIDE: 110 mmol/L (ref 98–111)
CO2: 26 mmol/L (ref 22–32)
CREATININE: 0.77 mg/dL (ref 0.44–1.00)
Calcium: 9 mg/dL (ref 8.9–10.3)
GFR calc non Af Amer: >60 mL/min (ref >60)
Glucose, Bld: 112 mg/dL — ABNORMAL HIGH (ref 70–99)
Potassium: 3.3 mmol/L — ABNORMAL LOW (ref 3.5–5.1)
SODIUM: 143 mmol/L (ref 135–145)

## 2018-06-22 LAB — URINALYSIS, ROUTINE W REFLEX MICROSCOPIC
Bacteria, UA: NONE SEEN
Bilirubin Urine: NEGATIVE
GLUCOSE, UA: NEGATIVE mg/dL
HGB URINE DIPSTICK: NEGATIVE
KETONES UR: NEGATIVE mg/dL
LEUKOCYTES UA: NEGATIVE
Nitrite: NEGATIVE
PROTEIN: NEGATIVE mg/dL
Specific Gravity, Urine: 1.017 (ref 1.005–1.030)
pH: 6 (ref 5.0–8.0)

## 2018-06-22 LAB — ABO/RH: ABO/RH(D): O POS

## 2018-06-22 LAB — APTT: aPTT: 34 seconds (ref 24–36)

## 2018-06-22 NOTE — H&P (Signed)
TOTAL KNEE REVISION ADMISSION H&P  Patient is being admitted for right revision total knee arthroplasty.  Subjective:  Chief Complaint:right knee pain.  HPI: Wanda [name], 52 y.o. female, has a history of pain and functional disability in the right knee(s) due to failed previous arthroplasty and patient has failed non-surgical conservative treatments for greater than 12 weeks to include NSAID's and/or analgesics, use of assistive devices, weight reduction as appropriate and activity modification. The indications for the revision of the total knee arthroplasty are loosening of one or more components. Onset of symptoms was gradual starting 3 years ago with gradually worsening course since that time.  Prior procedures on the right knee(s) include arthroplasty.  Patient currently rates pain in the right knee(s) at 10 out of 10 with activity. There is night pain, worsening of pain with activity and weight bearing, pain that interferes with activities of daily living and pain with passive range of motion.  Patient has evidence of prosthetic loosening by imaging studies. This condition presents safety issues increasing the risk of falls.   There is no current active infection.  Patient Active Problem List   Diagnosis Date Noted  . Viral gastroenteritis 11/05/2017  . Essential hypertension 10/26/2017  . Cough variant asthma with UACS component  10/25/2017  . Acute bronchitis 09/23/2017  . Asthma 09/23/2017  . Paget disease of bone 05/04/2016  . Bone lesion 01/22/2016  . Osteoarthritis of right knee 11/11/2015  . Primary osteoarthritis of right knee 11/10/2015  . Arthritis of knee 12/31/2014  . Primary osteoarthritis of left knee 10/12/2014  . Lumbar spinal stenosis 04/10/2014  . 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 Cervical disc disorder with radiculopathy of cervical region 05/03/2013  . Weakness of left side of body 10/28/2012  . Complicated migraine 63/84/5364  . Disturbance  of skin sensation 06/02/2012  . CVA (cerebral infarction)? vs Atypical Migraine 10/28/2011  . Sinusitis 04/10/2011  . Borderline blood pressure 04/10/2011  . CHRONIC RHINITIS 04/28/2010  . Lumbar L5 diskektomy 02/17/2010  . GERD 08/20/2009  . ABDOMINAL PAIN, UNSPECIFIED 08/20/2009  . HYPERCHOLESTEROLEMIA 12/26/2007  . Morbid obesity due to excess calories ([place]) complicated by hbp/ hyperlipidemia  12/07/2007  . DEPRESSION 12/07/2007  . URI, ACUTE 12/07/2007  . TMJ PAIN 12/07/2007  . FATIGUE 12/07/2007  . HEADACHE, CHRONIC 12/07/2007  . TOBACCO ABUSE, HX OF 12/07/2007   Past Medical History:  Diagnosis Date  . Acne   . Anemia    takes iron supplement  . Anxiety   . Asthma   . Chronic bronchitis ([place])   . Depression   . Family history of adverse reaction to anesthesia    pt's mother has hx. of post-op N/V  . Fibromyalgia   . GERD (gastroesophageal reflux disease)   . History of blood transfusion   . History of TIA (transient ischemic attack)    x 2; left-sided weakness  . Hypercholesterolemia    no current med.  . Hypertension    states under control with meds., has been on med. since 06/2011  . Keloid 12/8030   umbilical  . Migraines   . Osteoarthritis   . Osteoarthritis   . Paget disease of bone   . PONV (postoperative nausea and vomiting)    states severe  . Seasonal allergies   . Stroke ([place]) 10/27/2012   TIA   1- 10/29/11-weakness left side    Past Surgical History:  Procedure Laterality Date  . CARPAL TUNNEL RELEASE Left 12/2013  . CARPAL TUNNEL RELEASE Right 03/29/2015  .  CHOLECYSTECTOMY  08/27/2009  . DIAGNOSTIC LAPAROSCOPY  early 2005  . ETHMOIDECTOMY Bilateral 04/30/2006  . FUNCTIONAL ENDOSCOPIC SINUS SURGERY  04/30/2006  . HYSTEROSCOPY W/D&C  04/21/2002   with lysis of adhesions  . KNEE ARTHROSCOPY Left 02/2000  . LAMINOTOMY Left 03/27/2010   L5-S1; microdissection  . LAMINOTOMY Bilateral 07/12/2007   L4-5  . LUMBAR FUSION Left 07/18/2010   L4-5; L5-S1  .  LUMBAR FUSION  04/10/2014   L3-4, L4-5, L5-S1  . NASAL TURBINATE REDUCTION  04/30/2006   inferior  . TOTAL ABDOMINAL HYSTERECTOMY  11/01/2003   with lysis of adhesions; ovaries retained  . TOTAL KNEE ARTHROPLASTY Left 12/31/2014   Procedure: TOTAL LEFT KNEE ARTHROPLASTY;  Surgeon: Frederik Pear, MD;  Location: [facility];  Service: Orthopedics;  Laterality: Left;  . TOTAL KNEE ARTHROPLASTY Right 11/11/2015   Procedure: TOTAL KNEE ARTHROPLASTY;  Surgeon: Frederik Pear, MD;  Location: [facility];  Service: Orthopedics;  Laterality: Right;  Marland Kitchen VAGINAL HYSTERECTOMY     for DUB in 10/2003; partial    No current facility-administered medications for this encounter.    Current Outpatient Medications  Medication Sig Dispense Refill Last Dose  . albuterol (PROAIR HFA) 108 (90 Base) MCG/ACT inhaler Inhale 2 puffs into the lungs every 6 (six) hours as needed for wheezing or shortness of breath.   Taking  . albuterol (PROVENTIL) (2.5 MG/3ML) 0.083% nebulizer solution Take 2.5 mg by nebulization every 6 (six) hours as needed for wheezing or shortness of breath.     Marland Kitchen aspirin EC 325 MG tablet Take 1 tablet (325 mg total) by mouth daily.   Taking  . Azelaic Acid (FINACEA EX) Apply 1 application topically daily.   Taking  . bisoprolol (ZEBETA) 5 MG tablet Take 1 tablet (5 mg total) by mouth daily. 30 tablet 11 Taking  . busPIRone (BUSPAR) 15 MG tablet Take 15 mg by mouth 2 (two) times daily.    Taking  . cetirizine (ZYRTEC) 10 MG tablet Take 10 mg by mouth at bedtime.     . Cholecalciferol (VITAMIN D3) 125 MCG (5000 UT) CAPS Take 10,000 Units by mouth See admin instructions. Take 10000 units daily Monday through Friday     . diclofenac sodium (VOLTAREN) 1 % GEL Apply 2 g topically 4 (four) times daily as needed (pain).    Taking  . Erenumab-aooe (AIMOVIG) 70 MG/ML SOAJ Inject 70 mg into the skin every 30 (thirty) days. 1 pen 11   . ferrous sulfate 325 (65 FE) MG tablet Take 325 mg by mouth at bedtime.    Taking  .  hydrochlorothiazide (MICROZIDE) 12.5 MG capsule Take 12.5 mg by mouth daily.   Taking  . hydrocortisone cream 1 % Apply 1 application topically daily as needed for itching.     . mometasone (NASONEX) 50 MCG/ACT nasal spray Place 2 sprays into the nose daily.    Taking  . montelukast (SINGULAIR) 10 MG tablet Take 10 mg by mouth at bedtime.   Taking  . ondansetron (ZOFRAN) 8 MG tablet Take 8 mg by mouth every 8 (eight) hours as needed for nausea or vomiting.     Marland Kitchen OVER THE COUNTER MEDICATION Apply 1 application topically daily as needed (acne). Proactive toner/gel     . pantoprazole (PROTONIX) 40 MG tablet Take 40 mg by mouth 2 (two) times daily before a meal.    Taking  . rosuvastatin (CRESTOR) 10 MG tablet Take 10 mg by mouth daily.   Taking  . sertraline (ZOLOFT) 100 MG  tablet Take 100-200 mg by mouth See admin instructions. Take 100 mg in the am and 200 mg in the pm   Taking  . SPRIX 15.75 MG/SPRAY SOLN Place 1 spray into both nostrils every 6 (six) hours as needed (migraines).      Marland Kitchen tiZANidine (ZANAFLEX) 4 MG tablet TAKE 2 TABLETS BY MOUTH AT BEDTIME AS NEEDED FOR MUSCLE SPASMS (Patient taking differently: Take 8 mg by mouth at bedtime. ) 180 tablet 1   . Topiramate ER (TROKENDI XR) 100 MG CP24 Take 100 mg by mouth at bedtime. (Patient taking differently: Take 100 mg by mouth at bedtime. Take with 50 mg to equal 150 mg at bedtime) 30 capsule 0   . Topiramate ER (TROKENDI XR) 50 MG CP24 Take 50 mg by mouth at bedtime. (Patient taking differently: Take 50 mg by mouth at bedtime. Take with 100 mg to equal 150 mg at bedtime) 30 capsule 0   . tretinoin (RETIN-A) 0.05 % cream Apply 1 application topically at bedtime.     . Galcanezumab-gnlm (EMGALITY) 120 MG/ML SOSY Inject 120 mg into the skin every 30 (thirty) days. 1 Syringe 11   . topiramate ER (QUDEXY XR) 50 MG CS24 sprinkle capsule Take 1 capsule (50 mg total) by mouth daily. (Patient not taking: Reported on 06/09/2018) 90 each 1 Not Taking at  Unknown time  . topiramate ER (QUDEXY XR) CS24 sprinkle capsule Take 1 capsule (100 mg total) by mouth daily. (Patient not taking: Reported on 06/09/2018) 90 each 1 Not Taking at Unknown time   Allergies  Allergen Reactions  . Lisinopril Swelling and Other (See Comments)    SWELLING FACE/LIPS/NECK  . Latex Rash    Social History   Tobacco Use  . Smoking status: Former Smoker    Packs/day: 0.50    Years: 15.00    Pack years: 7.50    Types: Cigarettes    Last attempt to quit: 07/20/2004    Years since quitting: 13.9  . Smokeless tobacco: Never Used  Substance Use Topics  . Alcohol use: No    Family History  Problem Relation Age of Onset  . Anesthesia problems Mother        post-op N/V  . Anxiety disorder Father   . Depression Father   . Cervical cancer Maternal Grandmother   . Cancer Maternal Grandmother        mouth  . Heart attack Maternal Grandmother 55  . Hypertension Brother   . Heart failure Brother        ICD      Review of Systems  Constitutional: Positive for diaphoresis.  HENT: Positive for sinus pain.   Eyes: Positive for blurred vision.  Respiratory: Positive for shortness of breath.   Cardiovascular:       HTN  Gastrointestinal: Positive for heartburn.  Genitourinary: Negative.   Musculoskeletal: Positive for joint pain.  Skin: Positive for rash.  Neurological: Positive for dizziness, weakness and headaches.  Endo/Heme/Allergies: Negative.   Psychiatric/Behavioral: Positive for depression. The patient is nervous/anxious.      Objective:  Physical Exam  Constitutional: She is oriented to person, place, and time. She appears well-developed and well-nourished.  HENT:  Head: Normocephalic and atraumatic.  Eyes: Pupils are equal, round, and reactive to light.  Neck: Normal range of motion. Neck supple.  Cardiovascular: Intact distal pulses.  Respiratory: Effort normal.  Musculoskeletal:  Today, she has a range from roughly 5 to 75.  No  instability.  Varus stress continues to cause  pain.  Her calves are soft and minimally tender.  Neurological: She is alert and oriented to person, place, and time.  Skin: Skin is warm and dry.  Psychiatric: She has a normal mood and affect. Her behavior is normal. Judgment and thought content normal.    Vital signs in last 24 hours: Temp:  [98 F (36.7 C)] 98 F (36.7 C) (11/27 0744) Pulse Rate:  [66] 66 (11/27 0744) Resp:  [16] 16 (11/27 0744) BP: (125)/(55) 125/55 (11/27 0744) SpO2:  [99 %] 99 % (11/27 0744) Weight:  [239 kg] 108 kg (11/27 0744)  Labs:  Estimated body mass index is 38.41 kg/m as calculated from the following:   Height as of 06/22/18: 5\' 6"  (1.676 m).   Weight as of 06/22/18: 108 kg.  Imaging Review Plain radiographs demonstrate  probable loosening of the tibial implant with increased uptake along the proximal medial tibia on bone scan.    Preoperative templating of the joint replacement has been completed, documented, and submitted to the Operating Room personnel in order to optimize intra-operative equipment management.   Assessment/Plan:  End stage arthritis, right knee(s) with failed previous arthroplasty.   The patient history, physical examination, clinical judgment of the provider and imaging studies are consistent with end stage degenerative joint disease of the right knee(s), previous total knee arthroplasty. Revision total knee arthroplasty is deemed medically necessary. The treatment options including medical management, injection therapy, arthroscopy and revision arthroplasty were discussed at length. The risks and benefits of revision total knee arthroplasty were presented and reviewed. The risks due to aseptic loosening, infection, stiffness, patella tracking problems, thromboembolic complications and other imponderables were discussed. The patient acknowledged the explanation, agreed to proceed with the plan and consent was signed. Patient is being  admitted for inpatient treatment for surgery, pain control, PT, OT, prophylactic antibiotics, VTE prophylaxis, progressive ambulation and ADL's and discharge planning.The patient is planning to be discharged home with home health services.

## 2018-06-26 MED ORDER — BUPIVACAINE LIPOSOME 1.3 % IJ SUSP
20.0000 mL | INTRAMUSCULAR | Status: DC
  Filled 2018-06-26: qty 20

## 2018-06-26 MED ORDER — TRANEXAMIC ACID 1000 MG/10ML IV SOLN
2000.0000 mg | INTRAVENOUS | Status: DC
  Filled 2018-06-26: qty 20

## 2018-06-27 ENCOUNTER — Inpatient Hospital Stay (HOSPITAL_COMMUNITY): Payer: Medicare Other | Admitting: Certified Registered Nurse Anesthetist

## 2018-06-27 ENCOUNTER — Inpatient Hospital Stay (HOSPITAL_COMMUNITY)
Admission: RE | Admit: 2018-06-27 | Discharge: 2018-06-29 | DRG: 467 | Disposition: A | Discharge status: Home Health | Payer: Medicare Other | Attending: Orthopedic Surgery | Admitting: Orthopedic Surgery

## 2018-06-27 ENCOUNTER — Encounter (HOSPITAL_COMMUNITY): Payer: Self-pay | Admitting: Emergency Medicine

## 2018-06-27 ENCOUNTER — Other Ambulatory Visit: Payer: Self-pay

## 2018-06-27 ENCOUNTER — Encounter (HOSPITAL_COMMUNITY)
Admission: RE | Disposition: A | Discharge status: Home / Self Care | Payer: Self-pay | Source: Home / Self Care | Attending: Orthopedic Surgery

## 2018-06-27 DIAGNOSIS — M170 Bilateral primary osteoarthritis of knee: Secondary | ICD-10-CM | POA: Diagnosis present

## 2018-06-27 DIAGNOSIS — Z981 Arthrodesis status: Secondary | ICD-10-CM

## 2018-06-27 DIAGNOSIS — K219 Gastro-esophageal reflux disease without esophagitis: Secondary | ICD-10-CM | POA: Diagnosis present

## 2018-06-27 DIAGNOSIS — D62 Acute posthemorrhagic anemia: Secondary | ICD-10-CM | POA: Diagnosis not present

## 2018-06-27 DIAGNOSIS — F329 Major depressive disorder, single episode, unspecified: Secondary | ICD-10-CM | POA: Diagnosis not present

## 2018-06-27 DIAGNOSIS — Y792 Prosthetic and other implants, materials and accessory orthopedic devices associated with adverse incidents: Secondary | ICD-10-CM | POA: Diagnosis present

## 2018-06-27 DIAGNOSIS — R296 Repeated falls: Secondary | ICD-10-CM | POA: Diagnosis present

## 2018-06-27 DIAGNOSIS — I10 Essential (primary) hypertension: Secondary | ICD-10-CM | POA: Diagnosis not present

## 2018-06-27 DIAGNOSIS — J42 Unspecified chronic bronchitis: Secondary | ICD-10-CM | POA: Diagnosis present

## 2018-06-27 DIAGNOSIS — Z818 Family history of other mental and behavioral disorders: Secondary | ICD-10-CM | POA: Diagnosis not present

## 2018-06-27 DIAGNOSIS — T8484XA Pain due to internal orthopedic prosthetic devices, implants and grafts, initial encounter: Secondary | ICD-10-CM

## 2018-06-27 DIAGNOSIS — Z79899 Other long term (current) drug therapy: Secondary | ICD-10-CM

## 2018-06-27 DIAGNOSIS — J45909 Unspecified asthma, uncomplicated: Secondary | ICD-10-CM | POA: Diagnosis present

## 2018-06-27 DIAGNOSIS — M26629 Arthralgia of temporomandibular joint, unspecified side: Secondary | ICD-10-CM | POA: Diagnosis present

## 2018-06-27 DIAGNOSIS — Z8673 Personal history of transient ischemic attack (TIA), and cerebral infarction without residual deficits: Secondary | ICD-10-CM

## 2018-06-27 DIAGNOSIS — G43909 Migraine, unspecified, not intractable, without status migrainosus: Secondary | ICD-10-CM | POA: Diagnosis not present

## 2018-06-27 DIAGNOSIS — T84032A Mechanical loosening of internal right knee prosthetic joint, initial encounter: Secondary | ICD-10-CM | POA: Diagnosis not present

## 2018-06-27 DIAGNOSIS — Z8249 Family history of ischemic heart disease and other diseases of the circulatory system: Secondary | ICD-10-CM | POA: Diagnosis not present

## 2018-06-27 DIAGNOSIS — Z96651 Presence of right artificial knee joint: Secondary | ICD-10-CM | POA: Diagnosis present

## 2018-06-27 DIAGNOSIS — M797 Fibromyalgia: Secondary | ICD-10-CM | POA: Diagnosis not present

## 2018-06-27 DIAGNOSIS — Z96659 Presence of unspecified artificial knee joint: Secondary | ICD-10-CM | POA: Diagnosis present

## 2018-06-27 DIAGNOSIS — Z8049 Family history of malignant neoplasm of other genital organs: Secondary | ICD-10-CM

## 2018-06-27 DIAGNOSIS — E785 Hyperlipidemia, unspecified: Secondary | ICD-10-CM | POA: Diagnosis not present

## 2018-06-27 DIAGNOSIS — Z7982 Long term (current) use of aspirin: Secondary | ICD-10-CM

## 2018-06-27 DIAGNOSIS — Z9181 History of falling: Secondary | ICD-10-CM

## 2018-06-27 DIAGNOSIS — Z6838 Body mass index (BMI) 38.0-38.9, adult: Secondary | ICD-10-CM

## 2018-06-27 DIAGNOSIS — Z87891 Personal history of nicotine dependence: Secondary | ICD-10-CM

## 2018-06-27 DIAGNOSIS — M48061 Spinal stenosis, lumbar region without neurogenic claudication: Secondary | ICD-10-CM | POA: Diagnosis not present

## 2018-06-27 DIAGNOSIS — Z888 Allergy status to other drugs, medicaments and biological substances status: Secondary | ICD-10-CM

## 2018-06-27 DIAGNOSIS — Z01818 Encounter for other preprocedural examination: Secondary | ICD-10-CM

## 2018-06-27 DIAGNOSIS — Z90710 Acquired absence of both cervix and uterus: Secondary | ICD-10-CM

## 2018-06-27 HISTORY — PX: TOTAL KNEE REVISION: SHX996

## 2018-06-27 LAB — TYPE AND SCREEN
ABO/RH(D): O POS
Antibody Screen: NEGATIVE

## 2018-06-27 SURGERY — TOTAL KNEE REVISION
Anesthesia: General | Site: Knee | Laterality: Right

## 2018-06-27 MED ORDER — SCOPOLAMINE 1 MG/3DAYS TD PT72
MEDICATED_PATCH | TRANSDERMAL | Status: DC | PRN
  Administered 2018-06-27: 1 via TRANSDERMAL

## 2018-06-27 MED ORDER — MEPERIDINE HCL 50 MG/ML IJ SOLN: 6.2500 mg | INTRAMUSCULAR | Status: DC | PRN

## 2018-06-27 MED ORDER — ROCURONIUM BROMIDE 50 MG/5ML IV SOSY
PREFILLED_SYRINGE | INTRAVENOUS | Status: DC | PRN
  Administered 2018-06-27: 50 mg via INTRAVENOUS

## 2018-06-27 MED ORDER — DEXAMETHASONE SODIUM PHOSPHATE 10 MG/ML IJ SOLN
INTRAMUSCULAR | Status: AC
  Filled 2018-06-27: qty 1

## 2018-06-27 MED ORDER — KCL IN DEXTROSE-NACL 20-5-0.45 MEQ/L-%-% IV SOLN
INTRAVENOUS | Status: DC
  Administered 2018-06-27 – 2018-06-28 (×2): via INTRAVENOUS
  Filled 2018-06-27 (×3): qty 1000

## 2018-06-27 MED ORDER — LORATADINE 10 MG PO TABS
10.0000 mg | ORAL_TABLET | Freq: Every day | ORAL | Status: DC
  Administered 2018-06-27 – 2018-06-29 (×3): 10 mg via ORAL
  Filled 2018-06-27 (×4): qty 1

## 2018-06-27 MED ORDER — DOCUSATE SODIUM 100 MG PO CAPS
100.0000 mg | ORAL_CAPSULE | Freq: Two times a day (BID) | ORAL | Status: DC
  Administered 2018-06-27 – 2018-06-28 (×3): 100 mg via ORAL
  Filled 2018-06-27 (×4): qty 1

## 2018-06-27 MED ORDER — PHENYLEPHRINE 40 MCG/ML (10ML) SYRINGE FOR IV PUSH (FOR BLOOD PRESSURE SUPPORT)
PREFILLED_SYRINGE | INTRAVENOUS | Status: AC
  Filled 2018-06-27: qty 10

## 2018-06-27 MED ORDER — HYDROMORPHONE HCL 1 MG/ML IJ SOLN
0.5000 mg | INTRAMUSCULAR | Status: DC | PRN
  Administered 2018-06-27: 0.5 mg via INTRAVENOUS
  Administered 2018-06-28: 1 mg via INTRAVENOUS
  Filled 2018-06-27 (×2): qty 1

## 2018-06-27 MED ORDER — ASPIRIN 81 MG PO CHEW
81.0000 mg | CHEWABLE_TABLET | Freq: Two times a day (BID) | ORAL | Status: DC
  Administered 2018-06-27 – 2018-06-29 (×4): 81 mg via ORAL
  Filled 2018-06-27 (×4): qty 1

## 2018-06-27 MED ORDER — FENTANYL CITRATE (PF) 100 MCG/2ML IJ SOLN
INTRAMUSCULAR | Status: AC
  Filled 2018-06-27: qty 2

## 2018-06-27 MED ORDER — ASPIRIN EC 81 MG PO TBEC: 81.0000 mg | DELAYED_RELEASE_TABLET | Freq: Two times a day (BID) | ORAL | 0 refills | Status: AC

## 2018-06-27 MED ORDER — MIDAZOLAM HCL 5 MG/5ML IJ SOLN
INTRAMUSCULAR | Status: DC | PRN
  Administered 2018-06-27 (×2): 1 mg via INTRAVENOUS

## 2018-06-27 MED ORDER — ONDANSETRON HCL 4 MG PO TABS: 8.0000 mg | ORAL_TABLET | Freq: Three times a day (TID) | ORAL | Status: DC | PRN

## 2018-06-27 MED ORDER — HYDROMORPHONE HCL 1 MG/ML IJ SOLN
INTRAMUSCULAR | Status: AC
  Filled 2018-06-27: qty 1

## 2018-06-27 MED ORDER — TRANEXAMIC ACID-NACL 1000-0.7 MG/100ML-% IV SOLN
1000.0000 mg | INTRAVENOUS | Status: AC
  Administered 2018-06-27: 1000 mg via INTRAVENOUS
  Filled 2018-06-27: qty 100

## 2018-06-27 MED ORDER — ONDANSETRON HCL 4 MG/2ML IJ SOLN
INTRAMUSCULAR | Status: AC
  Filled 2018-06-27: qty 2

## 2018-06-27 MED ORDER — POLYETHYLENE GLYCOL 3350 17 G PO PACK: 17.0000 g | PACK | Freq: Every day | ORAL | Status: DC | PRN

## 2018-06-27 MED ORDER — SERTRALINE HCL 100 MG PO TABS
100.0000 mg | ORAL_TABLET | Freq: Every day | ORAL | Status: DC
  Administered 2018-06-28 – 2018-06-29 (×2): 100 mg via ORAL
  Filled 2018-06-27 (×2): qty 1

## 2018-06-27 MED ORDER — ROPIVACAINE HCL 7.5 MG/ML IJ SOLN
INTRAMUSCULAR | Status: DC | PRN
  Administered 2018-06-27: 20 mL via PERINEURAL

## 2018-06-27 MED ORDER — PROPOFOL 500 MG/50ML IV EMUL
INTRAVENOUS | Status: DC | PRN
  Administered 2018-06-27: 10 ug/kg/min via INTRAVENOUS

## 2018-06-27 MED ORDER — ONDANSETRON HCL 4 MG/2ML IJ SOLN: 4.0000 mg | Freq: Four times a day (QID) | INTRAMUSCULAR | Status: DC | PRN

## 2018-06-27 MED ORDER — DIPHENHYDRAMINE HCL 12.5 MG/5ML PO ELIX: 12.5000 mg | ORAL_SOLUTION | ORAL | Status: DC | PRN

## 2018-06-27 MED ORDER — TRANEXAMIC ACID-NACL 1000-0.7 MG/100ML-% IV SOLN
1000.0000 mg | Freq: Once | INTRAVENOUS | Status: AC
  Administered 2018-06-27: 1000 mg via INTRAVENOUS
  Filled 2018-06-27: qty 100

## 2018-06-27 MED ORDER — ALBUTEROL SULFATE (2.5 MG/3ML) 0.083% IN NEBU: 2.5000 mg | INHALATION_SOLUTION | Freq: Four times a day (QID) | RESPIRATORY_TRACT | Status: DC | PRN

## 2018-06-27 MED ORDER — METHOCARBAMOL 500 MG IVPB - SIMPLE MED
500.0000 mg | Freq: Four times a day (QID) | INTRAVENOUS | Status: DC | PRN
  Filled 2018-06-27: qty 50

## 2018-06-27 MED ORDER — BISACODYL 5 MG PO TBEC: 5.0000 mg | DELAYED_RELEASE_TABLET | Freq: Every day | ORAL | Status: DC | PRN

## 2018-06-27 MED ORDER — 0.9 % SODIUM CHLORIDE (POUR BTL) OPTIME
TOPICAL | Status: DC | PRN
  Administered 2018-06-27: 1000 mL

## 2018-06-27 MED ORDER — OXYCODONE HCL 5 MG PO TABS
5.0000 mg | ORAL_TABLET | ORAL | Status: DC | PRN
  Administered 2018-06-27: 5 mg via ORAL
  Administered 2018-06-27 – 2018-06-29 (×5): 10 mg via ORAL
  Filled 2018-06-27 (×6): qty 2

## 2018-06-27 MED ORDER — TRETINOIN 0.05 % EX CREA: 1.0000 "application " | TOPICAL_CREAM | Freq: Every day | CUTANEOUS | Status: DC

## 2018-06-27 MED ORDER — OXYCODONE-ACETAMINOPHEN 5-325 MG PO TABS: 1.0000 | ORAL_TABLET | ORAL | 0 refills | Status: DC | PRN

## 2018-06-27 MED ORDER — SODIUM CHLORIDE 0.9% IV SOLUTION: INTRAVENOUS | Status: DC | PRN

## 2018-06-27 MED ORDER — FERROUS SULFATE 325 (65 FE) MG PO TABS
325.0000 mg | ORAL_TABLET | Freq: Every day | ORAL | Status: DC
  Administered 2018-06-27 – 2018-06-28 (×2): 325 mg via ORAL
  Filled 2018-06-27 (×2): qty 1

## 2018-06-27 MED ORDER — MENTHOL 3 MG MT LOZG: 1.0000 | LOZENGE | OROMUCOSAL | Status: DC | PRN

## 2018-06-27 MED ORDER — MIDAZOLAM HCL 2 MG/2ML IJ SOLN
INTRAMUSCULAR | Status: AC
  Filled 2018-06-27: qty 4

## 2018-06-27 MED ORDER — LIDOCAINE 2% (20 MG/ML) 5 ML SYRINGE
INTRAMUSCULAR | Status: AC
  Filled 2018-06-27: qty 5

## 2018-06-27 MED ORDER — ONDANSETRON HCL 4 MG PO TABS: 4.0000 mg | ORAL_TABLET | Freq: Four times a day (QID) | ORAL | Status: DC | PRN

## 2018-06-27 MED ORDER — LIDOCAINE 2% (20 MG/ML) 5 ML SYRINGE
INTRAMUSCULAR | Status: DC | PRN
  Administered 2018-06-27: 100 mg via INTRAVENOUS

## 2018-06-27 MED ORDER — ROCURONIUM BROMIDE 100 MG/10ML IV SOLN
INTRAVENOUS | Status: AC
  Filled 2018-06-27: qty 1

## 2018-06-27 MED ORDER — TOPIRAMATE ER 25 MG PO SPRINKLE CAP24: 50.0000 mg | EXTENDED_RELEASE_CAPSULE | Freq: Every day | ORAL | Status: DC

## 2018-06-27 MED ORDER — BUPIVACAINE-EPINEPHRINE 0.25% -1:200000 IJ SOLN
INTRAMUSCULAR | Status: DC | PRN
  Administered 2018-06-27: 30 mL

## 2018-06-27 MED ORDER — METOCLOPRAMIDE HCL 5 MG/ML IJ SOLN: 5.0000 mg | Freq: Three times a day (TID) | INTRAMUSCULAR | Status: DC | PRN

## 2018-06-27 MED ORDER — CHLORHEXIDINE GLUCONATE 4 % EX LIQD: 60.0000 mL | Freq: Once | CUTANEOUS | Status: DC

## 2018-06-27 MED ORDER — PHENOL 1.4 % MT LIQD: 1.0000 | OROMUCOSAL | Status: DC | PRN

## 2018-06-27 MED ORDER — ONDANSETRON HCL 4 MG/2ML IJ SOLN
INTRAMUSCULAR | Status: DC | PRN
  Administered 2018-06-27: 4 mg via INTRAVENOUS

## 2018-06-27 MED ORDER — BUPIVACAINE LIPOSOME 1.3 % IJ SUSP
INTRAMUSCULAR | Status: DC | PRN
  Administered 2018-06-27: 20 mL

## 2018-06-27 MED ORDER — SODIUM CHLORIDE (PF) 0.9 % IJ SOLN
INTRAMUSCULAR | Status: DC | PRN
  Administered 2018-06-27: 50 mL

## 2018-06-27 MED ORDER — TRANEXAMIC ACID 1000 MG/10ML IV SOLN
INTRAVENOUS | Status: DC | PRN
  Administered 2018-06-27: 2000 mg via TOPICAL

## 2018-06-27 MED ORDER — SUGAMMADEX SODIUM 500 MG/5ML IV SOLN
INTRAVENOUS | Status: DC | PRN
  Administered 2018-06-27: 300 mg via INTRAVENOUS

## 2018-06-27 MED ORDER — MONTELUKAST SODIUM 10 MG PO TABS
10.0000 mg | ORAL_TABLET | Freq: Every day | ORAL | Status: DC
  Administered 2018-06-27 – 2018-06-28 (×2): 10 mg via ORAL
  Filled 2018-06-27 (×2): qty 1

## 2018-06-27 MED ORDER — TIZANIDINE HCL 2 MG PO TABS: 2.0000 mg | ORAL_TABLET | Freq: Four times a day (QID) | ORAL | 0 refills | Status: DC | PRN

## 2018-06-27 MED ORDER — ACETAMINOPHEN 325 MG PO TABS
325.0000 mg | ORAL_TABLET | Freq: Four times a day (QID) | ORAL | Status: DC | PRN
  Administered 2018-06-28: 650 mg via ORAL
  Filled 2018-06-27: qty 2

## 2018-06-27 MED ORDER — ALUM & MAG HYDROXIDE-SIMETH 200-200-20 MG/5ML PO SUSP: 30.0000 mL | ORAL | Status: DC | PRN

## 2018-06-27 MED ORDER — GABAPENTIN 300 MG PO CAPS
300.0000 mg | ORAL_CAPSULE | Freq: Three times a day (TID) | ORAL | Status: DC
  Administered 2018-06-27 – 2018-06-29 (×6): 300 mg via ORAL
  Filled 2018-06-27 (×6): qty 1

## 2018-06-27 MED ORDER — TOPIRAMATE ER 100 MG PO SPRINKLE CAP24
100.0000 mg | EXTENDED_RELEASE_CAPSULE | Freq: Every day | ORAL | Status: DC
  Administered 2018-06-27 – 2018-06-28 (×2): 100 mg via ORAL
  Filled 2018-06-27 (×3): qty 1

## 2018-06-27 MED ORDER — PANTOPRAZOLE SODIUM 40 MG PO TBEC
40.0000 mg | DELAYED_RELEASE_TABLET | Freq: Every day | ORAL | Status: DC
  Administered 2018-06-28 – 2018-06-29 (×2): 40 mg via ORAL
  Filled 2018-06-27 (×2): qty 1

## 2018-06-27 MED ORDER — DEXAMETHASONE SODIUM PHOSPHATE 4 MG/ML IJ SOLN
INTRAMUSCULAR | Status: DC | PRN
  Administered 2018-06-27: 10 mg via INTRAVENOUS

## 2018-06-27 MED ORDER — SERTRALINE HCL 100 MG PO TABS
200.0000 mg | ORAL_TABLET | Freq: Every day | ORAL | Status: DC
  Administered 2018-06-27 – 2018-06-28 (×2): 200 mg via ORAL
  Filled 2018-06-27 (×2): qty 2

## 2018-06-27 MED ORDER — SCOPOLAMINE 1 MG/3DAYS TD PT72
MEDICATED_PATCH | TRANSDERMAL | Status: AC
  Filled 2018-06-27: qty 1

## 2018-06-27 MED ORDER — TOPIRAMATE ER 25 MG PO SPRINKLE CAP24
50.0000 mg | EXTENDED_RELEASE_CAPSULE | Freq: Every day | ORAL | Status: DC
  Administered 2018-06-27 – 2018-06-28 (×2): 50 mg via ORAL
  Filled 2018-06-27 (×2): qty 2

## 2018-06-27 MED ORDER — METHOCARBAMOL 500 MG PO TABS
500.0000 mg | ORAL_TABLET | Freq: Four times a day (QID) | ORAL | Status: DC | PRN
  Administered 2018-06-27 – 2018-06-29 (×4): 500 mg via ORAL
  Filled 2018-06-27 (×4): qty 1

## 2018-06-27 MED ORDER — LACTATED RINGERS IV SOLN: INTRAVENOUS | Status: DC

## 2018-06-27 MED ORDER — HYDROCHLOROTHIAZIDE 12.5 MG PO CAPS
12.5000 mg | ORAL_CAPSULE | Freq: Every day | ORAL | Status: DC
  Administered 2018-06-28 – 2018-06-29 (×2): 12.5 mg via ORAL
  Filled 2018-06-27 (×2): qty 1

## 2018-06-27 MED ORDER — FLUTICASONE PROPIONATE 50 MCG/ACT NA SUSP
2.0000 | Freq: Every day | NASAL | Status: DC
  Administered 2018-06-27 – 2018-06-29 (×3): 2 via NASAL
  Filled 2018-06-27: qty 16

## 2018-06-27 MED ORDER — CELECOXIB 200 MG PO CAPS
200.0000 mg | ORAL_CAPSULE | Freq: Two times a day (BID) | ORAL | Status: DC
  Administered 2018-06-27 – 2018-06-29 (×4): 200 mg via ORAL
  Filled 2018-06-27 (×4): qty 1

## 2018-06-27 MED ORDER — LACTATED RINGERS IV SOLN
INTRAVENOUS | Status: DC
  Administered 2018-06-27 (×3): via INTRAVENOUS

## 2018-06-27 MED ORDER — BUPIVACAINE-EPINEPHRINE (PF) 0.25% -1:200000 IJ SOLN
INTRAMUSCULAR | Status: AC
  Filled 2018-06-27: qty 30

## 2018-06-27 MED ORDER — PROMETHAZINE HCL 25 MG/ML IJ SOLN: 6.2500 mg | INTRAMUSCULAR | Status: DC | PRN

## 2018-06-27 MED ORDER — KETAMINE HCL 10 MG/ML IJ SOLN
INTRAMUSCULAR | Status: DC | PRN
  Administered 2018-06-27: 30 mg via INTRAVENOUS

## 2018-06-27 MED ORDER — SUGAMMADEX SODIUM 500 MG/5ML IV SOLN
INTRAVENOUS | Status: AC
  Filled 2018-06-27: qty 5

## 2018-06-27 MED ORDER — BUSPIRONE HCL 5 MG PO TABS
15.0000 mg | ORAL_TABLET | Freq: Two times a day (BID) | ORAL | Status: DC
  Administered 2018-06-27 – 2018-06-29 (×4): 15 mg via ORAL
  Filled 2018-06-27 (×4): qty 3

## 2018-06-27 MED ORDER — SODIUM CHLORIDE (PF) 0.9 % IJ SOLN
INTRAMUSCULAR | Status: AC
  Filled 2018-06-27: qty 50

## 2018-06-27 MED ORDER — KETOROLAC TROMETHAMINE 15.75 MG/SPRAY NA SOLN: 1.0000 | Freq: Four times a day (QID) | NASAL | Status: DC | PRN

## 2018-06-27 MED ORDER — BISOPROLOL FUMARATE 5 MG PO TABS
5.0000 mg | ORAL_TABLET | Freq: Every day | ORAL | Status: DC
  Administered 2018-06-28 – 2018-06-29 (×2): 5 mg via ORAL
  Filled 2018-06-27 (×2): qty 1

## 2018-06-27 MED ORDER — PHENYLEPHRINE 40 MCG/ML (10ML) SYRINGE FOR IV PUSH (FOR BLOOD PRESSURE SUPPORT)
PREFILLED_SYRINGE | INTRAVENOUS | Status: DC | PRN
  Administered 2018-06-27 (×4): 80 ug via INTRAVENOUS

## 2018-06-27 MED ORDER — CEFAZOLIN SODIUM-DEXTROSE 2-4 GM/100ML-% IV SOLN
2.0000 g | INTRAVENOUS | Status: AC
  Administered 2018-06-27: 2 g via INTRAVENOUS
  Filled 2018-06-27: qty 100

## 2018-06-27 MED ORDER — HYDROCORTISONE 1 % EX CREA: 1.0000 "application " | TOPICAL_CREAM | Freq: Every day | CUTANEOUS | Status: DC | PRN

## 2018-06-27 MED ORDER — SODIUM CHLORIDE 0.9 % IR SOLN
Status: DC | PRN
  Administered 2018-06-27: 1000 mL

## 2018-06-27 MED ORDER — PROPOFOL 10 MG/ML IV BOLUS
INTRAVENOUS | Status: DC | PRN
  Administered 2018-06-27: 150 mg via INTRAVENOUS
  Administered 2018-06-27: 50 mg via INTRAVENOUS

## 2018-06-27 MED ORDER — PROPOFOL 10 MG/ML IV BOLUS
INTRAVENOUS | Status: AC
  Filled 2018-06-27: qty 60

## 2018-06-27 MED ORDER — FENTANYL CITRATE (PF) 100 MCG/2ML IJ SOLN
INTRAMUSCULAR | Status: DC | PRN
  Administered 2018-06-27 (×2): 25 ug via INTRAVENOUS
  Administered 2018-06-27: 50 ug via INTRAVENOUS
  Administered 2018-06-27: 100 ug via INTRAVENOUS

## 2018-06-27 MED ORDER — ALBUTEROL SULFATE HFA 108 (90 BASE) MCG/ACT IN AERS: 2.0000 | INHALATION_SPRAY | Freq: Four times a day (QID) | RESPIRATORY_TRACT | Status: DC | PRN

## 2018-06-27 MED ORDER — KETAMINE HCL 10 MG/ML IJ SOLN
INTRAMUSCULAR | Status: AC
  Filled 2018-06-27: qty 1

## 2018-06-27 MED ORDER — METOCLOPRAMIDE HCL 5 MG PO TABS: 5.0000 mg | ORAL_TABLET | Freq: Three times a day (TID) | ORAL | Status: DC | PRN

## 2018-06-27 MED ORDER — FLEET ENEMA 7-19 GM/118ML RE ENEM: 1.0000 | ENEMA | Freq: Once | RECTAL | Status: DC | PRN

## 2018-06-27 MED ORDER — HYDROMORPHONE HCL 1 MG/ML IJ SOLN
0.2500 mg | INTRAMUSCULAR | Status: DC | PRN
  Administered 2018-06-27 (×2): 0.5 mg via INTRAVENOUS

## 2018-06-27 MED ORDER — STERILE WATER FOR IRRIGATION IR SOLN
Status: DC | PRN
  Administered 2018-06-27: 2000 mL

## 2018-06-27 SURGICAL SUPPLY — 60 items
ATTUNE KNEE REV P/F STEM10X110 (Joint) ×3 IMPLANT
ATTUNE PSRP INSR SZ6 7 KNEE (Insert) ×2 IMPLANT
ATTUNE PSRP INSR SZ6 7MM KNEE (Insert) ×1 IMPLANT
BAG DECANTER FOR FLEXI CONT (MISCELLANEOUS) ×3 IMPLANT
BAG ZIPLOCK 12X15 (MISCELLANEOUS) ×3 IMPLANT
BANDAGE ACE 6X5 VEL STRL LF (GAUZE/BANDAGES/DRESSINGS) ×3 IMPLANT
BANDAGE ELASTIC 6 VELCRO ST LF (GAUZE/BANDAGES/DRESSINGS) ×3 IMPLANT
BLADE SAG 18X100X1.27 (BLADE) ×3 IMPLANT
BLADE SAW SAG 90X13X1.27 (BLADE) ×3 IMPLANT
BLADE SAW SGTL 81X20 HD (BLADE) ×3 IMPLANT
BOWL SMART MIX CTS (DISPOSABLE) IMPLANT
BRUSH FEMORAL CANAL (MISCELLANEOUS) ×3 IMPLANT
CEMENT HV SMART SET (Cement) ×6 IMPLANT
COVER SURGICAL LIGHT HANDLE (MISCELLANEOUS) ×3 IMPLANT
COVER WAND RF STERILE (DRAPES) IMPLANT
CUFF TOURN SGL QUICK 34 (TOURNIQUET CUFF) ×2
CUFF TRNQT CYL 34X4X40X1 (TOURNIQUET CUFF) ×1 IMPLANT
DECANTER SPIKE VIAL GLASS SM (MISCELLANEOUS) IMPLANT
DRAPE ORTHO SPLIT 77X108 STRL (DRAPES) ×2
DRAPE SURG ORHT 6 SPLT 77X108 (DRAPES) ×1 IMPLANT
DRAPE U-SHAPE 47X51 STRL (DRAPES) ×3 IMPLANT
DRESSING AQUACEL AG SP 3.5X10 (GAUZE/BANDAGES/DRESSINGS) IMPLANT
DRSG AQUACEL AG ADV 3.5X10 (GAUZE/BANDAGES/DRESSINGS) ×3 IMPLANT
DRSG AQUACEL AG ADV 3.5X14 (GAUZE/BANDAGES/DRESSINGS) IMPLANT
DRSG AQUACEL AG SP 3.5X10 (GAUZE/BANDAGES/DRESSINGS)
DURAPREP 26ML APPLICATOR (WOUND CARE) ×3 IMPLANT
ELECT REM PT RETURN 15FT ADLT (MISCELLANEOUS) ×3 IMPLANT
GLOVE BIO SURGEON STRL SZ7.5 (GLOVE) ×3 IMPLANT
GLOVE BIO SURGEON STRL SZ8.5 (GLOVE) ×3 IMPLANT
GLOVE BIOGEL PI IND STRL 8 (GLOVE) ×1 IMPLANT
GLOVE BIOGEL PI IND STRL 9 (GLOVE) ×1 IMPLANT
GLOVE BIOGEL PI INDICATOR 8 (GLOVE) ×2
GLOVE BIOGEL PI INDICATOR 9 (GLOVE) ×2
GOWN STRL REUS W/TWL XL LVL3 (GOWN DISPOSABLE) ×6 IMPLANT
HANDPIECE INTERPULSE COAX TIP (DISPOSABLE) ×2
HOOD PEEL AWAY FLYTE STAYCOOL (MISCELLANEOUS) ×9 IMPLANT
IMMOBILIZER KNEE 20 (SOFTGOODS) ×6 IMPLANT
IMMOBILIZER KNEE 20 THIGH 36 (SOFTGOODS) ×1 IMPLANT
NEEDLE HYPO 21X1.5 SAFETY (NEEDLE) ×6 IMPLANT
NS IRRIG 1000ML POUR BTL (IV SOLUTION) ×3 IMPLANT
PACK TOTAL KNEE CUSTOM (KITS) ×3 IMPLANT
PIN STEINMAN FIXATION KNEE (PIN) ×3 IMPLANT
PLATE REV TIB BAS ROT SZ5 KNEE (Orthopedic Implant) ×1 IMPLANT
PROTECTOR NERVE ULNAR (MISCELLANEOUS) ×3 IMPLANT
REV TIB BASE ROT PLAT SZ5 KNEE (Orthopedic Implant) ×3 IMPLANT
SET HNDPC FAN SPRY TIP SCT (DISPOSABLE) ×1 IMPLANT
STAPLER VISISTAT 35W (STAPLE) IMPLANT
SUT VIC AB 1 CTX 36 (SUTURE) ×2
SUT VIC AB 1 CTX36XBRD ANBCTR (SUTURE) ×1 IMPLANT
SUT VIC AB 2-0 CT1 27 (SUTURE) ×2
SUT VIC AB 2-0 CT1 TAPERPNT 27 (SUTURE) ×1 IMPLANT
SUT VIC AB 3-0 CT1 27 (SUTURE) ×2
SUT VIC AB 3-0 CT1 TAPERPNT 27 (SUTURE) ×1 IMPLANT
SWAB COLLECTION DEVICE MRSA (MISCELLANEOUS) IMPLANT
SWAB CULTURE ESWAB REG 1ML (MISCELLANEOUS) IMPLANT
SYR CONTROL 10ML LL (SYRINGE) ×6 IMPLANT
TRAY FOLEY MTR SLVR 16FR STAT (SET/KITS/TRAYS/PACK) ×3 IMPLANT
WATER STERILE IRR 1000ML POUR (IV SOLUTION) ×6 IMPLANT
WRAP KNEE MAXI GEL POST OP (GAUZE/BANDAGES/DRESSINGS) ×3 IMPLANT
YANKAUER SUCT BULB TIP 10FT TU (MISCELLANEOUS) ×3 IMPLANT

## 2018-06-27 NOTE — Op Note (Signed)
PATIENT ID:      Wanda [name]  MRN:     [id number] DOB/AGE:    August 12, 1965 / 52 y.o.       OPERATIVE REPORT    DATE OF PROCEDURE:  06/27/2018       PREOPERATIVE DIAGNOSIS:   LOOSE RIGHT TOTAL KNEE      Estimated body mass index is 38.41 kg/m as calculated from the following:   Height as of this encounter: 5\' 6"  (1.676 m).   Weight as of this encounter: 108 kg.                                                        POSTOPERATIVE DIAGNOSIS:   LOOSE RIGHT TOTAL KNEE                                                                      PROCEDURE:  Procedure(s): TOTAL KNEE REVISION- TIBIAL Using DepuyAttune 5 revision tibia, 10 mm x 110 mm stem cemented patella and femur were in good shape.  SURGEON: Kerin Salen    ASSISTANT:   Kerry Hough. [employer]   (Present and scrubbed throughout the case, critical for assistance with exposure, retraction, instrumentation, and closure.)         ANESTHESIA: General orotracheal, 20cc Exparel, 50cc 0.25% Marcaine  EBL: 300 cc  FLUID REPLACEMENT: 1500 cc crystaloid  Tourniquet Time: None  Drains: None  Tranexamic Acid: 1gm IV, 2gm topical  Exparel: 266mg    COMPLICATIONS:  None         INDICATIONS FOR PROCEDURE: The patient has  LOOSE RIGHT TOTAL KNEE, varus deformities, XR shows possible medial subsidence of the tibial implant, the bone scan is hot in this region.  Patient's original surgery was done in the year 2017 it became painful about 8 months ago.  She is aware that there is a significant chance we will not find anything loose and only get in there that her pain is severe wakes her up at night interferes with activities as cause of near falling episodes.  She has had no fevers or chills.  Peripheral white count, knee aspirate, CBC sed rate and CRP are all normal.  General tracheal risks and benefits of surgery have been discussed, questions answered.   DESCRIPTION OF PROCEDURE: The patient identified by armband, received  IV antibiotics, in  the holding area at [facility]. Patient taken to the operating room, appropriate anesthetic monitors were attached, and general endotracheal anesthesia was  induced. IV Tranexamic acid was given.Tourniquet applied high to the operative thigh. Lateral post and foot positioner applied to the table, the lower extremity was then prepped and draped in usual sterile fashion from the toes to the tourniquet. Time-out procedure was performed. The skin and subcutaneous tissue along the incision was injected with 20 cc of a mixture of Exparel and Marcaine solution, using a 20-gauge by 1-1/2 inch needle. We began the operation, with the knee flexed 130 degrees, by making the anterior midline incision starting at handbreadth above the patella going over the patella 1 cm medial  to and 4 cm distal to the tibial tubercle. Small bleeders in the skin and the subcutaneous tissue identified and cauterized. Transverse retinaculum was incised and reflected medially and a medial parapatellar arthrotomy was accomplished. the patella was everted and theprepatellar fat pad resected.  At this point we took out synovial fluid specimen sent for cell for Gram stain and culture at the end of the case the specimen came back occasional white cells primarily monos no organisms.  The superficial medial collateral ligament was then elevated from anterior to posterior along the proximal flare of the tibia there was no gross looseness of the tibial implant noticed at this point.  We continue to work our way around posteriorly removing any peripheral bone from the interface of the tibial implant cement and bone.  With the knee hyperflexed we then tapped on the edge of the tibia with a osteotome and came off relatively easy although the cement underneath appeared to be in 1 piece.  Using the initiating drill we then entered the center of the tibia followed by hand reaming up to an 11 mm reamer followed by the revision tibial cutting guide to  degree posterior slope which was pinned in the place allowing [place] to remove a thin wafer of cement and bone.  Using the cement removal tools we then remove the rest of the cement from the proximal tibia fans and from the cone.  We then sized for a #5 tibial implant which centered nicely of the reamer.  We then placed the conical reamer obtaining good fit.  We assembled a trial with a 5 baseplate and a 10 mm x 263 mm stem if fit nicely.  We cut the fins.  Performed a trial reduction with a 7 tibial bearing and obtain full extension and good stability medially and laterally.  At this time we assessed the patella which was well fixed in the femur which was well fixed and tested with a C clamp.  The proximal tibia and tibial canal were then pulse lavaged clean.  TXA sponge was applied to all bony surfaces and to the canal itself.  A double batch of DePuy HV cement was mixed at the back table and applied to the assembled 5 attune revision tibial baseplate with 785 x 10 mm stem along the map finish only.  Cement was also applied to the proximal tibia and the implant hammered into place excess cement removed with a 7 mm bearing applied and the knee reduced and brought out to full extension and then held in compression at 30 degrees flexion using a sure foot. The wound was irrigated out with normal saline solution pulse lavage. The rest of the Exparel was injected into the parapatellar arthrotomy, subcutaneous tissues, and periosteal tissues. The parapatellar arthrotomy was closed with running #1 Vicryl suture. The subcutaneous tissue with 0 and 2-0 undyed Vicryl suture, and the skin with running 3-0 SQ vicryl. An Aquacil and Ace wrap were applied. The patient was taken to recovery room without difficulty.   Kerin Salen 06/27/2018, 9:34 AM

## 2018-06-27 NOTE — Anesthesia Postprocedure Evaluation (Signed)
Anesthesia Post Note  Patient: Wanda [name]  Procedure(s) Performed: TOTAL KNEE REVISION- TIBIAL (Right Knee)     Patient location during evaluation: PACU Anesthesia Type: General Level of consciousness: awake and alert Pain management: pain level controlled Vital Signs Assessment: post-procedure vital signs reviewed and stable Respiratory status: spontaneous breathing, nonlabored ventilation, respiratory function stable and patient connected to nasal cannula oxygen Cardiovascular status: blood pressure returned to baseline and stable Postop Assessment: no apparent nausea or vomiting Anesthetic complications: no    Last Vitals:  Vitals:   06/27/18 1137 06/27/18 1245  BP: 129/77 (!) 121/58  Pulse: 81 77  Resp: 14 14  Temp: 36.6 C   SpO2: 98% 97%    Last Pain:  Vitals:   06/27/18 1227  TempSrc:   PainSc: 0-No pain                 Effie Berkshire

## 2018-06-27 NOTE — Transfer of Care (Signed)
Immediate Anesthesia Transfer of Care Note  Patient: Wanda [name]  Procedure(s) Performed: Procedure(s): TOTAL KNEE REVISION- TIBIAL (Right)  Patient Location: PACU  Anesthesia Type:General and Regional  Level of Consciousness: Patient easily awoken, sedated, comfortable, cooperative, following commands, responds to stimulation.   Airway & Oxygen Therapy: Patient spontaneously breathing, ventilating well, oxygen via simple oxygen mask.  Post-op Assessment: Report given to PACU RN, vital signs reviewed and stable, moving all extremities.   Post vital signs: Reviewed and stable.  Complications: No apparent anesthesia complications Last Vitals:  Vitals Value Taken Time  BP 140/68 06/27/2018 10:00 AM  Temp    Pulse 82 06/27/2018 10:03 AM  Resp 19 06/27/2018 10:03 AM  SpO2 100 % 06/27/2018 10:03 AM  Vitals shown include unvalidated device data.  Last Pain:  Vitals:   06/27/18 0629  TempSrc: Oral  PainSc:       Patients Stated Pain Goal: 4 (84/13/24 4010)  Complications: No apparent anesthesia complications

## 2018-06-27 NOTE — Evaluation (Signed)
Physical Therapy Evaluation Patient Details Name: Wanda [name] MRN: [id number] DOB: 1965/12/20 Today's Date: 06/27/2018   History of Present Illness  52 yo female s/p R TKR revision of tibial component on 06/27/18. 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  Clinical Impression   Pt presents with decreased R knee ROM, R knee pain, difficulty performing bed mobility, increased time and effort for transfers/gait, and decreased tolerance for ambulation due to pain. Pt to benefit from acute PT to address deficits. Pt ambulated 25 ft with min guard assist for safety. Pt somewhat lethargic during session, but with increased alertness with mobility. RN notified. Pt educated on quad sets (5-10/hour), ankle pumps (20/hour), and heel slides (5-10/hour) to perform this afternoon/evening to lessen stiffness and increase circulation, to pt's tolerance and limited by pain. PT to progress mobility as tolerated, and will continue to follow acutely.      Follow Up Recommendations Follow surgeon's recommendation for DC plan and follow-up therapies;Supervision for mobility/OOB(HHPT )    Equipment Recommendations  None recommended by PT(Pt requesting shower seat )    Recommendations for Other Services       Precautions / Restrictions Precautions Precautions: Fall Restrictions Weight Bearing Restrictions: No Other Position/Activity Restrictions: WBAT       Mobility  Bed Mobility Overal bed mobility: Needs Assistance Bed Mobility: Supine to Sit     Supine to sit: Min assist;HOB elevated     General bed mobility comments: Min assist for RLE management, verbal cuing for sequencing to EOB.   Transfers Overall transfer level: Needs assistance Equipment used: Rolling walker (2 wheeled) Transfers: Sit to/from  Stand Sit to Stand: Min guard;From elevated surface         General transfer comment: Min guard for safety and R knee guarding. Verbal cuing for hand placement.   Ambulation/Gait Ambulation/Gait assistance: Min guard;Min assist Gait Distance (Feet): 25 Feet Assistive device: Rolling walker (2 wheeled) Gait Pattern/deviations: Step-to pattern;Decreased stride length;Trunk flexed;Antalgic;Decreased weight shift to right Gait velocity: decr    General Gait Details: Min assist initially for R knee extension guarding, pt with no buckling so transitioned to min guard for safety. Verbal cuing for placement in RW, sequencing, turning.   Stairs            Wheelchair Mobility    Modified Rankin (Stroke Patients Only)       Balance Overall balance assessment: Mild deficits observed, not formally tested                                           Pertinent Vitals/Pain Pain Assessment: 0-10 Pain Score: 9  Pain Location: R knee  Pain Descriptors / Indicators: Throbbing;Burning Pain Intervention(s): Limited activity within patient's tolerance;Repositioned;Ice applied;Monitored during session;Premedicated before session    Home Living Family/patient expects to be discharged to:: Private residence Living Arrangements: Children Available Help at Discharge: Family;Available PRN/intermittently Type of Home: Apartment Home Access: Level entry     Home Layout: One level Home Equipment: Bedside commode;Walker - 2 wheels;Cane - single point      Prior Function Level of Independence: Needs assistance   Gait / Transfers Assistance Needed: Used cane for ambulation prior to admission.   ADL's / Homemaking Assistance Needed:  Daughter assisting with cooking and cleaning, needed assist with socks and shoes   Comments: Pt reports multiple falls, none in the last 6 months. Pt states circumstances of falls is "loss of balance when moving room to room"      Hand  Dominance   Dominant Hand: Right    Extremity/Trunk Assessment   Upper Extremity Assessment Upper Extremity Assessment: Overall WFL for tasks assessed    Lower Extremity Assessment Lower Extremity Assessment: RLE deficits/detail RLE Deficits / Details: Suspected post-surgical weakness; able to perform SLR with <10* quad lag, quad set, ankle pumps  RLE Sensation: WNL    Cervical / Trunk Assessment Cervical / Trunk Assessment: Normal  Communication   Communication: No difficulties  Cognition Arousal/Alertness: Lethargic;Suspect due to medications Behavior During Therapy: [facility] for tasks assessed/performed Overall Cognitive Status: Within Functional Limits for tasks assessed                                        General Comments      Exercises Total Joint Exercises Ankle Circles/Pumps: AROM;Both;10 reps;Seated Goniometric ROM: Knee AAROM ~5-40*, limited by pain.    Assessment/Plan    PT Assessment Patient needs continued PT services  PT Problem List Decreased strength;Pain;Decreased range of motion;Decreased activity tolerance;Decreased knowledge of use of DME;Decreased balance;Decreased safety awareness;Decreased mobility       PT Treatment Interventions DME instruction;Therapeutic activities;Gait training;Therapeutic exercise;Patient/family education;Balance training;Functional mobility training    PT Goals (Current goals can be found in the Care Plan section)  Acute Rehab PT Goals Patient Stated Goal: none stated  PT Goal Formulation: With patient Time For Goal Achievement: 07/04/18 Potential to Achieve Goals: Good    Frequency 7X/week   Barriers to discharge        Co-evaluation               AM-PAC PT "6 Clicks" Mobility  Outcome Measure Help needed turning from your back to your side while in a flat bed without using bedrails?: A Little Help needed moving from lying on your back to sitting on the side of a flat bed without using  bedrails?: A Little Help needed moving to and from a bed to a chair (including a wheelchair)?: A Little Help needed standing up from a chair using your arms (e.g., wheelchair or bedside chair)?: A Little Help needed to walk in hospital room?: A Little Help needed climbing 3-5 steps with a railing? : A Lot 6 Click Score: 17    End of Session Equipment Utilized During Treatment: Gait belt Activity Tolerance: Patient tolerated treatment well Patient left: in chair;with chair alarm set;with call bell/phone within reach;with family/visitor present;with SCD's reapplied Nurse Communication: Mobility status PT Visit Diagnosis: Other abnormalities of gait and mobility (R26.89);Difficulty in walking, not elsewhere classified (R26.2)    Time: 7672-0947 PT Time Calculation (min) (ACUTE ONLY): 31 min   Charges:   PT Evaluation $PT Eval Low Complexity: 1 Low PT Treatments $Gait Training: 8-22 mins      Julien Girt, PT Acute Rehabilitation Services Pager [phone]  Office [phone]   Sahiti Joswick D Elonda Husky 06/27/2018, 5:23 PM

## 2018-06-27 NOTE — Discharge Instructions (Signed)

## 2018-06-27 NOTE — Anesthesia Procedure Notes (Signed)
Anesthesia Regional Block: Adductor canal block   Pre-Anesthetic Checklist: ,, timeout performed, Correct Patient, Correct Site, Correct Laterality, Correct Procedure, Correct Position, site marked, Risks and benefits discussed,  Surgical consent,  Pre-op evaluation,  At surgeon's request and post-op pain management  Laterality: Right  Prep: chloraprep       Needles:  Injection technique: Single-shot  Needle Type: Echogenic Stimulator Needle     Needle Length: 9cm  Needle Gauge: 21     Additional Needles:   Procedures:,,,, ultrasound used (permanent image in chart),,,,  Narrative:  Start time: 06/27/2018 7:10 AM End time: 06/27/2018 7:15 AM Injection made incrementally with aspirations every 5 mL.  Performed by: Personally  Anesthesiologist: Effie Berkshire, MD  Additional Notes: Patient tolerated the procedure well. Local anesthetic introduced in an incremental fashion under minimal resistance after negative aspirations. No paresthesias were elicited. After completion of the procedure, no acute issues were identified and patient continued to be monitored by RN.

## 2018-06-27 NOTE — Anesthesia Procedure Notes (Signed)
Procedure Name: Intubation Date/Time: 06/27/2018 7:36 AM Performed by: Deliah Boston, CRNA Pre-anesthesia Checklist: Patient identified, Emergency Drugs available, Suction available and Patient being monitored Patient Re-evaluated:Patient Re-evaluated prior to induction Oxygen Delivery Method: Circle system utilized Preoxygenation: Pre-oxygenation with 100% oxygen Induction Type: IV induction Ventilation: Mask ventilation without difficulty Laryngoscope Size: Mac and 3 Grade View: Grade II Tube type: Oral Tube size: 7.0 mm Number of attempts: 1 Airway Equipment and Method: Stylet and Oral airway Placement Confirmation: ETT inserted through vocal cords under direct vision,  positive ETCO2 and breath sounds checked- equal and bilateral Secured at: 22 cm Tube secured with: Tape Dental Injury: Teeth and Oropharynx as per pre-operative assessment and Injury to lip  Comments: Small nick to upper lip, left side

## 2018-06-27 NOTE — Anesthesia Preprocedure Evaluation (Addendum)
Anesthesia Evaluation  Patient identified by MRN, date of birth, ID band Patient awake    Reviewed: Allergy & Precautions, NPO status , Patient's Chart, lab work & pertinent test results  History of Anesthesia Complications (+) PONV  Airway Mallampati: I  TM Distance: >3 FB Neck ROM: Full    Dental  (+) Teeth Intact, Dental Advisory Given   Pulmonary asthma , former smoker,    breath sounds clear to auscultation       Cardiovascular hypertension, Pt. on medications  Rhythm:Regular Rate:Normal     Neuro/Psych  Headaches, Anxiety Depression CVA    GI/Hepatic Neg liver ROS, GERD  Medicated,  Endo/Other  negative endocrine ROS  Renal/GU negative Renal ROS     Musculoskeletal  (+) Arthritis , Fibromyalgia -  Abdominal Normal abdominal exam  (+)   Peds  Hematology   Anesthesia Other Findings   Reproductive/Obstetrics                            Lab Results  Component Value Date   WBC 6.6 06/22/2018   HGB 12.2 06/22/2018   HCT 38.4 06/22/2018   MCV 91.0 06/22/2018   PLT 168 06/22/2018   Lab Results  Component Value Date   CREATININE 0.77 06/22/2018   BUN 13 06/22/2018   NA 143 06/22/2018   K 3.3 (L) 06/22/2018   CL 110 06/22/2018   CO2 26 06/22/2018   Lab Results  Component Value Date   INR 1.02 06/22/2018   INR 0.99 10/11/2017   INR 1.11 10/31/2015   EKG: normal sinus rhythm.  Anesthesia Physical Anesthesia Plan  ASA: II  Anesthesia Plan: General   Post-op Pain Management:    Induction: Intravenous  PONV Risk Score and Plan: Ondansetron, Dexamethasone and Midazolam  Airway Management Planned: Oral ETT  Additional Equipment: None  Intra-op Plan:   Post-operative Plan: Extubation in OR  Informed Consent: I have reviewed the patients History and Physical, chart, labs and discussed the procedure including the risks, benefits and alternatives for the proposed  anesthesia with the patient or authorized representative who has indicated his/her understanding and acceptance.   Dental advisory given  Plan Discussed with: CRNA  Anesthesia Plan Comments:        Anesthesia Quick Evaluation

## 2018-06-27 NOTE — Interval H&P Note (Signed)
History and Physical Interval Note:  06/27/2018 6:47 AM  Wanda [name]  has presented today for surgery, with the diagnosis of LOOSE RIGHT TOTAL KNEE  The various methods of treatment have been discussed with the patient and family. After consideration of risks, benefits and other options for treatment, the patient has consented to  Procedure(s): TOTAL KNEE REVISION (Right) as a surgical intervention .  The patient's history has been reviewed, patient examined, no change in status, stable for surgery.  I have reviewed the patient's chart and labs.  Questions were answered to the patient's satisfaction.     Kerin Salen

## 2018-06-28 ENCOUNTER — Encounter (HOSPITAL_COMMUNITY): Payer: Self-pay | Admitting: Orthopedic Surgery

## 2018-06-28 LAB — CBC
HEMATOCRIT: 35.1 % — AB (ref 36.0–46.0)
Hemoglobin: 10.6 g/dL — ABNORMAL LOW (ref 12.0–15.0)
MCH: 29 pg (ref 26.0–34.0)
MCHC: 30.2 g/dL (ref 30.0–36.0)
MCV: 95.9 fL (ref 80.0–100.0)
PLATELETS: 180 10*3/uL (ref 150–400)
RBC: 3.66 MIL/uL — ABNORMAL LOW (ref 3.87–5.11)
RDW: 13.7 % (ref 11.5–15.5)
WBC: 11.5 10*3/uL — ABNORMAL HIGH (ref 4.0–10.5)
nRBC: 0 % (ref 0.0–0.2)

## 2018-06-28 LAB — BASIC METABOLIC PANEL
Anion gap: 8 (ref 5–15)
BUN: 9 mg/dL (ref 6–20)
CO2: 21 mmol/L — ABNORMAL LOW (ref 22–32)
Calcium: 8.6 mg/dL — ABNORMAL LOW (ref 8.9–10.3)
Chloride: 113 mmol/L — ABNORMAL HIGH (ref 98–111)
Creatinine, Ser: 0.73 mg/dL (ref 0.44–1.00)
GFR calc Af Amer: >60 mL/min (ref >60)
GFR calc non Af Amer: >60 mL/min (ref >60)
GLUCOSE: 152 mg/dL — AB (ref 70–99)
Potassium: 4.2 mmol/L (ref 3.5–5.1)
Sodium: 142 mmol/L (ref 135–145)

## 2018-06-28 MED ORDER — KETOROLAC TROMETHAMINE 30 MG/ML IJ SOLN
30.0000 mg | Freq: Once | INTRAMUSCULAR | Status: AC | PRN
  Administered 2018-06-28: 30 mg via INTRAVENOUS
  Filled 2018-06-28: qty 1

## 2018-06-28 NOTE — Addendum Note (Signed)
Addendum  created 06/28/18 7354 by Lollie Sails, CRNA   Charge Capture section accepted

## 2018-06-28 NOTE — Progress Notes (Signed)
Patient ID: Wanda [name], female   DOB: [birth year], 52 y.o.   MRN: [id number] PATIENT ID: Wanda [name]  MRN: [id number]  DOB/AGE:  03-26-1966 / 52 y.o.  1 Day Post-Op Procedure(s) (LRB): TOTAL KNEE REVISION- TIBIAL (Right)    PROGRESS NOTE Subjective: Patient is alert, oriented, no Nausea, no Vomiting, yes passing gas. Taking PO well. Denies SOB, Chest or Calf Pain. Using Incentive Spirometer, PAS in place. Ambulate 25', Patient reports pain as 2/10 .    Objective: Vital signs in last 24 hours: Vitals:   06/27/18 1855 06/27/18 2148 06/28/18 0140 06/28/18 0455  BP: (Abnormal) 106/53 (Abnormal) 106/56 (Abnormal) 107/41 (Abnormal) 107/49  Pulse: 64 71 73 72  Resp:  17    Temp: 98.5 F (36.9 C) 98.5 F (36.9 C) 98.1 F (36.7 C) 98.1 F (36.7 C)  TempSrc: Oral Oral Oral Oral  SpO2: 100% 98% 100% 100%  Weight:      Height:          Intake/Output from previous day: I/O last 3 completed shifts: In: 6629 [P.O.:800; I.V.:3655; IV Piggyback:100] Out: 4100 [Urine:4000; Blood:100]   Intake/Output this shift: No intake/output data recorded.   LABORATORY DATA: Recent Labs    06/28/18 0344  WBC 11.5*  HGB 10.6*  HCT 35.1*  PLT 180  NA 142  K 4.2  CL 113*  CO2 21*  BUN 9  CREATININE 0.73  GLUCOSE 152*  CALCIUM 8.6*    Examination: Neurologically intact ABD soft Neurovascular intact Sensation intact distally Intact pulses distally Dorsiflexion/Plantar flexion intact Incision: dressing C/D/I No cellulitis present Compartment soft} Patient able to do straight leg raise in bed, flex to 60 without any difficulty no drainage on dressing  Assessment:   1 Day Post-Op Procedure(s) (LRB): TOTAL KNEE REVISION- TIBIAL (Right) ADDITIONAL DIAGNOSIS: Expected Acute Blood Loss Anemia, History of tobacco abuse,History of spinal stenosis, history of fibromyalgia, Hypertension, depression    Plan: PT/OT WBAT, AROM and PROM  DVT Prophylaxis:  SCDx72hrs, ASA 81 mg  BID x 2 weeks DISCHARGE PLAN: Home, Today if the patient meets all PT goals DISCHARGE NEEDS: HHPT, Walker and 3-in-1 comode seat,Patient is requesting a new shower chair as the old one has become unstable.     Kerin Salen 06/28/2018, 7:37 AM

## 2018-06-28 NOTE — Discharge Summary (Addendum)
Patient ID: Wanda [name] MRN: [id number] DOB/AGE: June 23, 1966 52 y.o.  Admit date: 06/27/2018 Discharge date: 06/29/2018 Admission Diagnoses:  Principal Problem:   Painful total knee replacement, right ([place]) Active Problems:   History of revision of total knee arthroplasty   Discharge Diagnoses:  Same  Past Medical History:  Diagnosis Date  . Acne   . Anemia    takes iron supplement  . Anxiety   . Asthma   . Chronic bronchitis ([place])   . Depression   . Family history of adverse reaction to anesthesia    pt's mother has hx. of post-op N/V  . Fibromyalgia   . GERD (gastroesophageal reflux disease)   . History of blood transfusion   . History of TIA (transient ischemic attack)    x 2; left-sided weakness  . Hypercholesterolemia    no current med.  . Hypertension    states under control with meds., has been on med. since 06/2011  . Keloid 10/1285   umbilical  . Migraines   . Osteoarthritis   . Osteoarthritis   . Paget disease of bone   . PONV (postoperative nausea and vomiting)    states severe  . Seasonal allergies   . Stroke ([place]) 10/27/2012   TIA   1- 10/29/11-weakness left side    Surgeries: Procedure(s): TOTAL KNEE REVISION- TIBIAL on 06/27/2018   Consultants:   Discharged Condition: Improved  Hospital Course: Wanda [name] is an 52 y.o. female who was admitted 06/27/2018 for operative treatment ofPainful total knee replacement, right ([place]). Patient has severe unremitting pain that affects sleep, daily activities, and work/hobbies. After pre-op clearance the patient was taken to the operating room on 06/27/2018 and underwent  Procedure(s): TOTAL KNEE REVISION- TIBIAL.    Patient was given perioperative antibiotics:  Anti-infectives (From admission, onward)   Start     Dose/Rate Route Frequency Ordered Stop   06/27/18 0615  ceFAZolin (ANCEF) IVPB 2g/100 mL premix     2 g 200 mL/hr over 30 Minutes Intravenous On call to O.R. 06/27/18 8676 06/27/18  0737       Patient was given sequential compression devices, early ambulation, and chemoprophylaxis to prevent DVT.  Patient benefited maximally from hospital stay and there were no complications.    Recent vital signs:  Patient Vitals for the past 24 hrs:  BP Temp Temp src Pulse Resp SpO2  06/28/18 0455 (Abnormal) 107/49 98.1 F (36.7 C) Oral 72 no documentation 100 %  06/28/18 0140 (Abnormal) 107/41 98.1 F (36.7 C) Oral 73 no documentation 100 %  06/27/18 2148 (Abnormal) 106/56 98.5 F (36.9 C) Oral 71 17 98 %  06/27/18 1855 (Abnormal) 106/53 98.5 F (36.9 C) Oral 64 no documentation 100 %  06/27/18 1441 136/69 98.2 F (36.8 C) Oral 62 14 100 %  06/27/18 1340 (Abnormal) 101/53 98.3 F (36.8 C) Oral 72 14 99 %  06/27/18 1245 (Abnormal) 121/58 no documentation no documentation 77 14 97 %  06/27/18 1137 129/77 97.9 F (36.6 C) Oral 81 14 98 %  06/27/18 1115 no documentation 98.5 F (36.9 C) no documentation no documentation no documentation no documentation  06/27/18 1045 135/69 no documentation no documentation 74 20 100 %  06/27/18 1030 130/82 no documentation no documentation 77 20 100 %  06/27/18 1015 137/80 no documentation no documentation 80 19 100 %  06/27/18 1000 140/68 no documentation no documentation 86 18 100 %  06/27/18 0957 137/67 97.8 F (36.6 C) no documentation 80 20 100 %  Recent laboratory studies:  Recent Labs    06/28/18 0344  WBC 11.5*  HGB 10.6*  HCT 35.1*  PLT 180  NA 142  K 4.2  CL 113*  CO2 21*  BUN 9  CREATININE 0.73  GLUCOSE 152*  CALCIUM 8.6*     Discharge Medications:   Allergies as of 06/28/2018    Allergen Reactions Comment   Lisinopril Swelling, Other (See Comments) SWELLING FACE/LIPS/NECK   Latex Rash       Medication List    Take these medications   aspirin EC 81 MG tablet Take 1 tablet (81 mg total) by mouth 2 (two) times daily. What changed:    medication strength  how much to take  when to take this    bisoprolol 5 MG tablet Commonly known as:  ZEBETA Take 1 tablet (5 mg total) by mouth daily.   busPIRone 15 MG tablet Commonly known as:  BUSPAR Take 15 mg by mouth 2 (two) times daily.   cetirizine 10 MG tablet Commonly known as:  ZYRTEC Take 10 mg by mouth at bedtime.   diclofenac sodium 1 % Gel Commonly known as:  VOLTAREN Apply 2 g topically 4 (four) times daily as needed (pain).   Erenumab-aooe 70 MG/ML Soaj Inject 70 mg into the skin every 30 (thirty) days.   ferrous sulfate 325 (65 FE) MG tablet Take 325 mg by mouth at bedtime.   FINACEA EX Apply 1 application topically daily.   Galcanezumab-gnlm 120 MG/ML Sosy Inject 120 mg into the skin every 30 (thirty) days.   hydrochlorothiazide 12.5 MG capsule Commonly known as:  MICROZIDE Take 12.5 mg by mouth daily.   hydrocortisone cream 1 % Apply 1 application topically daily as needed for itching.   mometasone 50 MCG/ACT nasal spray Commonly known as:  NASONEX Place 2 sprays into the nose daily.   montelukast 10 MG tablet Commonly known as:  SINGULAIR Take 10 mg by mouth at bedtime.   ondansetron 8 MG tablet Commonly known as:  ZOFRAN Take 8 mg by mouth every 8 (eight) hours as needed for nausea or vomiting.   OVER THE COUNTER MEDICATION Apply 1 application topically daily as needed (acne). Proactive toner/gel   oxyCODONE-acetaminophen 5-325 MG tablet Commonly known as:  PERCOCET/ROXICET Take 1 tablet by mouth every 4 (four) hours as needed for severe pain.   pantoprazole 40 MG tablet Commonly known as:  PROTONIX Take 40 mg by mouth 2 (two) times daily before a meal.   PROAIR HFA 108 (90 Base) MCG/ACT inhaler Generic drug:  albuterol Inhale 2 puffs into the lungs every 6 (six) hours as needed for wheezing or shortness of breath.   albuterol (2.5 MG/3ML) 0.083% nebulizer solution Commonly known as:  PROVENTIL Take 2.5 mg by nebulization every 6 (six) hours as needed for wheezing or shortness of  breath.   rosuvastatin 10 MG tablet Commonly known as:  CRESTOR Take 10 mg by mouth daily.   sertraline 100 MG tablet Commonly known as:  ZOLOFT Take 100-200 mg by mouth See admin instructions. Take 100 mg in the am and 200 mg in the pm   SPRIX 15.75 MG/SPRAY Soln Generic drug:  Ketorolac Tromethamine Place 1 spray into both nostrils every 6 (six) hours as needed (migraines).   tiZANidine 4 MG tablet Commonly known as:  ZANAFLEX TAKE 2 TABLETS BY MOUTH AT BEDTIME AS NEEDED FOR MUSCLE SPASMS What changed:    how much to take  how to take this  when to  take this  additional instructions   tiZANidine 2 MG tablet Commonly known as:  ZANAFLEX Take 1 tablet (2 mg total) by mouth every 6 (six) hours as needed. What changed:  You were already taking a medication with the same name, and this prescription was added. Make sure you understand how and when to take each.   topiramate ER Cs24 sprinkle capsule Commonly known as:  QUDEXY XR Take 1 capsule (100 mg total) by mouth daily. What changed:  Another medication with the same name was changed. Make sure you understand how and when to take each.   topiramate ER 50 MG Cs24 sprinkle capsule Commonly known as:  QUDEXY XR Take 1 capsule (50 mg total) by mouth daily. What changed:  Another medication with the same name was changed. Make sure you understand how and when to take each.   Topiramate ER 50 MG Cp24 Commonly known as:  TROKENDI XR Take 50 mg by mouth at bedtime. What changed:  additional instructions   Topiramate ER 100 MG Cp24 Commonly known as:  TROKENDI XR Take 100 mg by mouth at bedtime. What changed:  additional instructions   tretinoin 0.05 % cream Commonly known as:  RETIN-A Apply 1 application topically at bedtime.   Vitamin D3 125 MCG (5000 UT) Caps Take 10,000 Units by mouth See admin instructions. Take 10000 units daily Monday through Friday        Durable Medical Equipment  (From admission, onward)          Start     Ordered   06/27/18 1145  DME Walker rolling  Once    Question:  Patient needs a walker to treat with the following condition  Answer:  Status post right knee replacement   06/27/18 1145   06/27/18 1145  DME 3 n 1  Once     06/27/18 1145           Discharge Care Instructions  (From admission, onward)         Start     Ordered   06/28/18 0000  Change dressing    Comments:  Change dressing Only if drainage exceeds 40% of window on dressing   06/28/18 0741          Diagnostic Studies: No results found.  Disposition: Discharge disposition: 01-Home or Self Care       Discharge Instructions    Call MD / Call 911   Complete by:  As directed    If you experience chest pain or shortness of breath, CALL 911 and be transported to the hospital emergency room.  If you develope a fever above 101 F, pus (white drainage) or increased drainage or redness at the wound, or calf pain, call your surgeon's office.   Change dressing   Complete by:  As directed    Change dressing Only if drainage exceeds 40% of window on dressing   Constipation Prevention   Complete by:  As directed    Drink plenty of fluids.  Prune juice may be helpful.  You may use a stool softener, such as Colace (over the counter) 100 mg twice a day.  Use MiraLax (over the counter) for constipation as needed.   Diet - low sodium heart healthy   Complete by:  As directed    Increase activity slowly as tolerated   Complete by:  As directed       Follow-up Information    Frederik Pear, MD In 2 weeks.   Specialty:  Orthopedic Surgery Contact information: [place] [address] [phone]            Signed: Kerin Salen 06/28/2018, 7:42 AM

## 2018-06-28 NOTE — Progress Notes (Signed)
Physical Therapy Treatment Patient Details Name: Wanda [name] MRN: [id number] DOB: 10/15/1965 Today's Date: 06/28/2018    History of Present Illness 52 yo female s/p R TKR revision of tibial component on 06/27/18. 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    PT Comments    Pt is progressing well with mobility, she ambulated 180' with RW, no loss of balance. Pt performed R knee exercises with good technique. Overall increased activity tolerance this session.  AAROM L knee 5-65* in sitting.   Follow Up Recommendations  Follow surgeon's recommendation for DC plan and follow-up therapies;Supervision for mobility/OOB(HHPT )     Equipment Recommendations  None recommended by PT(Pt requesting shower seat )    Recommendations for Other Services       Precautions / Restrictions Precautions Precautions: Fall Restrictions Weight Bearing Restrictions: No Other Position/Activity Restrictions: WBAT     Mobility  Bed Mobility Overal bed mobility: Needs Assistance Bed Mobility: Sit to Supine;Supine to Sit     Supine to sit: Modified independent (Device/Increase time);HOB elevated Sit to supine: Modified independent (Device/Increase time);HOB elevated   General bed mobility comments: VCs technique  Transfers Overall transfer level: Needs assistance Equipment used: Rolling walker (2 wheeled) Transfers: Sit to/from Stand Sit to Stand: From elevated surface;Supervision         General transfer comment:  Verbal cuing for hand placement.   Ambulation/Gait Ambulation/Gait assistance: Min guard;Supervision Gait Distance (Feet): 180 Feet Assistive device: Rolling walker (2 wheeled) Gait Pattern/deviations: Step-to pattern;Decreased stride length;Antalgic;Decreased weight shift to right Gait  velocity: decr    General Gait Details: steady, no loss of balance   Stairs             Wheelchair Mobility    Modified Rankin (Stroke Patients Only)       Balance Overall balance assessment: Modified Independent                                          Cognition Arousal/Alertness: Awake/alert Behavior During Therapy: WFL for tasks assessed/performed Overall Cognitive Status: Within Functional Limits for tasks assessed                                        Exercises Total Joint Exercises Ankle Circles/Pumps: AROM;Both;10 reps;Seated Quad Sets: AROM;Right;5 reps;Supine Short Arc Quad: AAROM;Right;5 reps;Supine Heel Slides: AAROM;Right;5 reps;Supine Hip ABduction/ADduction: AAROM;Right;5 reps;Supine Straight Leg Raises: AAROM;Right;5 reps;Supine Long Arc Quad: AROM;AAROM;Right;10 reps;Seated Knee Flexion: AROM;AAROM;Right;Seated;15 reps Goniometric ROM: 5-65* AAROM R knee sitting    General Comments        Pertinent Vitals/Pain Pain Score: 8  Pain Location: R knee  Pain Descriptors / Indicators: Throbbing;Burning Pain Intervention(s): Limited activity within patient's tolerance;Premedicated before session;Monitored during session;Ice applied    Home Living                      Prior Function            PT Goals (current goals can now be found in the care plan section) Acute Rehab PT Goals Patient Stated Goal: bowling PT Goal Formulation: With patient Time For Goal Achievement: 07/04/18  Potential to Achieve Goals: Good Progress towards PT goals: Progressing toward goals    Frequency    7X/week      PT Plan Current plan remains appropriate    Co-evaluation              AM-PAC PT "6 Clicks" Mobility   Outcome Measure  Help needed turning from your back to your side while in a flat bed without using bedrails?: A Little Help needed moving from lying on your back to sitting on the side of a  flat bed without using bedrails?: A Little Help needed moving to and from a bed to a chair (including a wheelchair)?: None Help needed standing up from a chair using your arms (e.g., wheelchair or bedside chair)?: None Help needed to walk in hospital room?: None Help needed climbing 3-5 steps with a railing? : A Lot 6 Click Score: 20    End of Session Equipment Utilized During Treatment: Gait belt Activity Tolerance: Patient tolerated treatment well Patient left: with call bell/phone within reach;with SCD's reapplied;in bed Nurse Communication: Mobility status PT Visit Diagnosis: Other abnormalities of gait and mobility (R26.89);Difficulty in walking, not elsewhere classified (R26.2)     Time: 2010-0712 PT Time Calculation (min) (ACUTE ONLY): 33 min  Charges:  $Gait Training: 8-22 mins $Therapeutic Exercise: 8-22 mins                     Blondell Reveal Kistler PT 06/28/2018  Acute Rehabilitation Services Pager [phone] Office [phone]

## 2018-06-28 NOTE — Progress Notes (Signed)
Physical Therapy Treatment Patient Details Name: Wanda [name] MRN: [id number] DOB: 19-Aug-1965 Today's Date: 06/28/2018    History of Present Illness 52 yo female s/p R TKR revision of tibial component on 06/27/18. 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    PT Comments    Pt ambulated 80' with RW, distance limited by pain. Pt able to perform 5 reps each of HEP, limited by R knee pain. Activity tolerance limited by pain.   Follow Up Recommendations  Follow surgeon's recommendation for DC plan and follow-up therapies;Supervision for mobility/OOB(HHPT )     Equipment Recommendations  None recommended by PT(Pt requesting shower seat )    Recommendations for Other Services       Precautions / Restrictions Precautions Precautions: Fall Restrictions Weight Bearing Restrictions: No Other Position/Activity Restrictions: WBAT     Mobility  Bed Mobility Overal bed mobility: Needs Assistance Bed Mobility: Sit to Supine       Sit to supine: Supervision   General bed mobility comments: VCs technique  Transfers Overall transfer level: Needs assistance Equipment used: Rolling walker (2 wheeled) Transfers: Sit to/from Stand Sit to Stand: Min guard;From elevated surface         General transfer comment: Min guard for safety and R knee guarding. Verbal cuing for hand placement.   Ambulation/Gait Ambulation/Gait assistance: Min guard Gait Distance (Feet): 75 Feet Assistive device: Rolling walker (2 wheeled) Gait Pattern/deviations: Step-to pattern;Decreased stride length;Trunk flexed;Antalgic;Decreased weight shift to right Gait velocity: decr    General Gait Details: steady, no loss of balance   Stairs             Wheelchair Mobility    Modified Rankin (Stroke Patients  Only)       Balance Overall balance assessment: Mild deficits observed, not formally tested                                          Cognition Arousal/Alertness: Awake/alert Behavior During Therapy: WFL for tasks assessed/performed Overall Cognitive Status: Within Functional Limits for tasks assessed                                        Exercises Total Joint Exercises Ankle Circles/Pumps: AROM;Both;10 reps;Seated Quad Sets: AROM;Right;5 reps;Supine Short Arc Quad: AAROM;Right;5 reps;Supine Heel Slides: AAROM;Right;5 reps;Supine Hip ABduction/ADduction: AAROM;Right;5 reps;Supine Straight Leg Raises: AAROM;Right;5 reps;Supine Goniometric ROM: 5-45* AAROM R knee limited by pain    General Comments        Pertinent Vitals/Pain Pain Score: 10-Worst pain ever Pain Descriptors / Indicators: Throbbing;Burning Pain Intervention(s): Limited activity within patient's tolerance;Monitored during session;Premedicated before session;Ice applied    Home Living                      Prior Function            PT Goals (current goals can now be found in the care plan section) Acute Rehab PT Goals Patient Stated Goal: bowling PT Goal Formulation: With patient Time For Goal Achievement: 07/04/18 Potential to Achieve Goals: Good Progress towards PT goals: Progressing toward goals    Frequency  7X/week      PT Plan Current plan remains appropriate    Co-evaluation              AM-PAC PT "6 Clicks" Mobility   Outcome Measure  Help needed turning from your back to your side while in a flat bed without using bedrails?: A Little Help needed moving from lying on your back to sitting on the side of a flat bed without using bedrails?: A Little Help needed moving to and from a bed to a chair (including a wheelchair)?: A Little Help needed standing up from a chair using your arms (e.g., wheelchair or bedside chair)?: A Little Help  needed to walk in hospital room?: A Little Help needed climbing 3-5 steps with a railing? : A Lot 6 Click Score: 17    End of Session Equipment Utilized During Treatment: Gait belt Activity Tolerance: Patient tolerated treatment well Patient left: with call bell/phone within reach;with SCD's reapplied;in bed;with nursing/sitter in room Nurse Communication: Mobility status PT Visit Diagnosis: Other abnormalities of gait and mobility (R26.89);Difficulty in walking, not elsewhere classified (R26.2)     Time: 3545-6256 PT Time Calculation (min) (ACUTE ONLY): 28 min  Charges:  $Gait Training: 8-22 mins $Therapeutic Exercise: 8-22 mins                     Blondell Reveal Kistler PT 06/28/2018  Acute Rehabilitation Services Pager [phone] Office [phone]

## 2018-06-28 NOTE — Plan of Care (Signed)

## 2018-06-28 NOTE — Addendum Note (Signed)
Addendum  created 06/28/18 0715 by Lollie Sails, CRNA   Charge Capture section accepted

## 2018-06-28 NOTE — Care Management Note (Signed)
Case Management Note  Patient Details  Name: Wanda [name] MRN: [id number] Date of Birth: [birth year]  Subjective/Objective:   Discharge planning, spoke with patient and spouse at bedside. Have chosen [facility] for [facility] PT, evaluate and treat.   Action/Plan: Contacted [facility] for referral. They have accepted.  Needs 3n1, contacted [facility] to deliver to room. [phone]                 Expected Discharge Date:  06/28/18               Expected Discharge Plan:  [place]  In-House Referral:  NA  Discharge planning Services  CM Consult  Post Acute Care Choice:  Home Health, Durable Medical Equipment Choice offered to:  Patient, Spouse  DME Arranged:  3-N-1 DME Agency:  [place]:  PT [place]:  [facility] ([facility])  Status of Service:  Completed, signed off  If discussed at [employer], dates discussed:    Additional Comments:  Guadalupe Maple, RN 06/28/2018, 12:24 PM

## 2018-06-29 LAB — CBC
HCT: 37.9 % (ref 36.0–46.0)
Hemoglobin: 11.8 g/dL — ABNORMAL LOW (ref 12.0–15.0)
MCH: 28.6 pg (ref 26.0–34.0)
MCHC: 31.1 g/dL (ref 30.0–36.0)
MCV: 91.8 fL (ref 80.0–100.0)
NRBC: 0 % (ref 0.0–0.2)
Platelets: 194 10*3/uL (ref 150–400)
RBC: 4.13 MIL/uL (ref 3.87–5.11)
RDW: 13.7 % (ref 11.5–15.5)
WBC: 10.5 10*3/uL (ref 4.0–10.5)

## 2018-06-29 NOTE — Telephone Encounter (Signed)
Rcvd approval from [facility] [id number] 06/09/18 - 07/27/19

## 2018-06-29 NOTE — Progress Notes (Signed)
Physical Therapy Treatment Patient Details Name: Wanda [name] MRN: [id number] DOB: 07/14/1966 Today's Date: 06/29/2018    History of Present Illness 52 yo female s/p R TKR revision of tibial component on 06/27/18. 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    PT Comments    Pt progressing well with mobility, she ambulated 200' with RW and demonstrates good understanding of HEP. From PT standpoint, she is ready to DC home.   Follow Up Recommendations  Follow surgeon's recommendation for DC plan and follow-up therapies;Supervision for mobility/OOB(HHPT )     Equipment Recommendations  None recommended by PT(Pt requesting shower seat )    Recommendations for Other Services       Precautions / Restrictions Precautions Precautions: Fall Restrictions Weight Bearing Restrictions: No Other Position/Activity Restrictions: WBAT     Mobility  Bed Mobility Overal bed mobility: Modified Independent Bed Mobility: Sit to Supine;Supine to Sit     Supine to sit: Modified independent (Device/Increase time);HOB elevated Sit to supine: Modified independent (Device/Increase time);HOB elevated      Transfers Overall transfer level: Modified independent Equipment used: Rolling walker (2 wheeled) Transfers: Sit to/from Stand Sit to Stand: Modified independent (Device/Increase time)         General transfer comment: using armrests  Ambulation/Gait Ambulation/Gait assistance: Modified independent (Device/Increase time) Gait Distance (Feet): 200 Feet Assistive device: Rolling walker (2 wheeled) Gait Pattern/deviations: Decreased stride length;Antalgic;Decreased weight shift to right;Step-through pattern Gait velocity: decr    General Gait Details: steady, no loss of balance   Stairs             Wheelchair Mobility    Modified Rankin (Stroke Patients Only)       Balance Overall balance assessment: Modified Independent                                          Cognition Arousal/Alertness: Awake/alert Behavior During Therapy: WFL for tasks assessed/performed Overall Cognitive Status: Within Functional Limits for tasks assessed                                        Exercises Total Joint Exercises Ankle Circles/Pumps: AROM;Both;10 reps;Seated Quad Sets: AROM;Right;5 reps;Supine Short Arc Quad: AAROM;Right;Supine;10 reps Heel Slides: AAROM;Right;Supine;10 reps Hip ABduction/ADduction: AAROM;Right;Supine;10 reps Straight Leg Raises: AAROM;Right;Supine;10 reps Long Arc Quad: AROM;AAROM;Right;10 reps;Seated Knee Flexion: AAROM;AROM;Right;10 reps;Seated Goniometric ROM: 5-70* AAROM R knee sitting    General Comments        Pertinent Vitals/Pain Pain Score: 8  Pain Location: R knee  Pain Descriptors / Indicators: Throbbing;Burning Pain Intervention(s): Limited activity within patient's tolerance;Premedicated before session;Ice applied;Monitored during session    Home Living                      Prior Function            PT Goals (current goals can now be found in the care plan section) Acute Rehab PT Goals Patient Stated Goal: bowling PT Goal Formulation: With patient Time For Goal Achievement: 07/04/18 Potential to Achieve Goals: Good Progress towards PT goals: Goals met/education completed, patient discharged from PT  Frequency    7X/week      PT Plan Current plan remains appropriate    Co-evaluation              AM-PAC PT "6 Clicks" Mobility   Outcome Measure  Help needed turning from your back to your side while in a flat bed without using bedrails?: None Help needed moving from lying on your back to sitting on the side of a flat bed without using bedrails?: None Help needed moving to  and from a bed to a chair (including a wheelchair)?: None Help needed standing up from a chair using your arms (e.g., wheelchair or bedside chair)?: None Help needed to walk in hospital room?: None Help needed climbing 3-5 steps with a railing? : A Little 6 Click Score: 23    End of Session Equipment Utilized During Treatment: Gait belt Activity Tolerance: Patient tolerated treatment well Patient left: with call bell/phone within reach;in bed Nurse Communication: Mobility status PT Visit Diagnosis: Other abnormalities of gait and mobility (R26.89);Difficulty in walking, not elsewhere classified (R26.2)     Time: 1599-6895 PT Time Calculation (min) (ACUTE ONLY): 27 min  Charges:  $Gait Training: 8-22 mins $Therapeutic Exercise: 8-22 mins                     Blondell Reveal Kistler PT 06/29/2018  Acute Rehabilitation Services Pager [phone] Office [phone]

## 2018-06-29 NOTE — Progress Notes (Signed)
PATIENT ID: Wanda [name]  MRN: [id number]  DOB/AGE:  [birth year] / 52 y.o.  2 Days Post-Op Procedure(s) (LRB): TOTAL KNEE REVISION- TIBIAL (Right)    PROGRESS NOTE Subjective: Patient is alert, oriented, no Nausea, no Vomiting, yes passing gas. Taking PO well.  Pt did have a migraine headache yesterday delaying discharge. Denies SOB, Chest or Calf Pain. Using Incentive Spirometer, PAS in place. Ambulate WBAT with pt walking 180 ft with therapy, Patient reports pain as moderate .    Objective: Vital signs in last 24 hours: Vitals:   06/28/18 2119 06/28/18 2221 06/29/18 0001 06/29/18 0528  BP: 134/63   114/63  Pulse: 75   81  Resp: 16   18  Temp: 99.5 F (37.5 C) (!) 100.7 F (38.2 C) 99.2 F (37.3 C) 98.9 F (37.2 C)  TempSrc:  Oral Oral   SpO2: 97%   90%  Weight:      Height:          Intake/Output from previous day: I/O last 3 completed shifts: In: 2644.6 [P.O.:1340; I.V.:1304.6] Out: 3200 [Urine:3200]   Intake/Output this shift: No intake/output data recorded.   LABORATORY DATA: Recent Labs    06/28/18 0344 06/29/18 0347  WBC 11.5* 10.5  HGB 10.6* 11.8*  HCT 35.1* 37.9  PLT 180 194  NA 142  --   K 4.2  --   CL 113*  --   CO2 21*  --   BUN 9  --   CREATININE 0.73  --   GLUCOSE 152*  --   CALCIUM 8.6*  --     Examination: Neurologically intact Neurovascular intact Sensation intact distally Intact pulses distally Dorsiflexion/Plantar flexion intact Incision: dressing C/D/I and no drainage No cellulitis present Compartment soft}  Assessment:   2 Days Post-Op Procedure(s) (LRB): TOTAL KNEE REVISION- TIBIAL (Right) ADDITIONAL DIAGNOSIS: Expected Acute Blood Loss Anemia, History of tobacco abuse,History of spinal stenosis, history of fibromyalgia, Hypertension, depression   Plan: PT/OT WBAT, AROM and PROM  DVT Prophylaxis:  SCDx72hrs, ASA 81 mg BID x 2 weeks DISCHARGE PLAN: Home, today DISCHARGE NEEDS: HHPT, Walker and 3-in-1 comode seat    Wanda [name] 06/29/2018, 7:37 AM

## 2018-07-02 LAB — AEROBIC/ANAEROBIC CULTURE W GRAM STAIN (SURGICAL/DEEP WOUND): Culture: NO GROWTH

## 2018-07-02 LAB — AEROBIC/ANAEROBIC CULTURE (SURGICAL/DEEP WOUND)

## 2018-07-15 ENCOUNTER — Other Ambulatory Visit: Payer: Self-pay | Admitting: Neurology

## 2018-07-27 DIAGNOSIS — E119 Type 2 diabetes mellitus without complications: Secondary | ICD-10-CM

## 2018-07-27 HISTORY — PX: ROTATOR CUFF REPAIR: SHX139

## 2018-07-27 HISTORY — DX: Type 2 diabetes mellitus without complications: E11.9

## 2018-07-28 ENCOUNTER — Other Ambulatory Visit: Payer: Self-pay | Admitting: Neurology

## 2018-08-22 NOTE — Progress Notes (Signed)
Prior Authorization initiated via [email or web address] for pt's   Aimovig 70mg /mL

## 2018-08-22 NOTE — Progress Notes (Signed)
Aimovig PA approved thru 07/27/2019

## 2018-08-22 NOTE — Progress Notes (Deleted)
Prior Authorization initiated via [email or web address] for pt's   Aimovig 70mg /mL

## 2018-10-19 ENCOUNTER — Ambulatory Visit: Payer: Medicare Other | Admitting: Neurology

## 2018-12-14 NOTE — Progress Notes (Addendum)
Virtual Visit via Telephone Note The purpose of this virtual visit is to provide medical care while limiting exposure to the novel coronavirus.    Consent was obtained for phone visit: Yes Answered questions that patient had about telehealth interaction:  Yes I discussed the limitations, risks, security and privacy concerns of performing an evaluation and management service by telephone. I also discussed with the patient that there may be a patient responsible charge related to this service. The patient expressed understanding and agreed to proceed.  Pt location: Home Physician Location: Home Name of referring provider:  Rankins, Bill Salinas, MD I connected with Wanda [name] at patients initiation/request on 12/15/2018 at  3:00 PM EDT by telephone and verified that I am speaking with the correct person using two identifiers. Pt MRN:  [id number] Pt DOB:  04-Sep-1965 Video Participants:  Wanda [name]   History of Present Illness:  Wanda [name] is a 53 year old right-handed woman with Paget's disease, osteoarthritis, fibromyalgia, GERD, IBS, hyperlipidemia, carotid artery disease and bilateral knee replacements who follows up for migraine and hemiplegic migraine.  UPDATE:  Last visit, started Emgality.  Headaches are improved. Intensity:  Usually moderate (twice over past 5 weeks she had a severe headache which did not abort easily with Sprix) Duration:  Usually 1-2 hours Frequency:  1 day a week to 1 day every other week.  However, due to increased stress over past 2 months, they have increased to 1 to 2 days a week. Frequency of abortive medication: 1 day a week Rescue:  Sprix NS Current NSAIDS: Sprix nasal spray Current analgesics: Hydrocodone (for pain related to Paget's) Current triptans: None Current ergotamine: None Current anti-emetic: Zofran 8 mg Current muscle relaxants: Tizanidine 8 mg at bedtime Current anti-anxiolytic: BuSpar Current sleep aide:  none  Current Antihypertensive medications: Zepeta Current Antidepressant medications: Sertraline 100 mg Current Anticonvulsant medications: Trokendi XR 150 mg Current anti-CGRP:  Emgality Current Vitamins/Herbal/Supplements: Ferrous sulfate Current Antihistamines/Decongestants: None Other therapy: Dry needling Hormone: None  Caffeine: No Alcohol: No Smoker: No Diet: Hydrates, no fried foods or dairy, no sweets or soda Exercise: Walks twice daily with her dog Depression: Yes; Anxiety: Yes (affected by pain) Other pain: Pain related to fibromyalgia Sleep hygiene: Fair  HISTORY: On 10/28/11, she was hospitalized for severe left-sided headache associated with slurred speech, left facial droop and left sided arm and leg weakness and numbness. Headache lasted a day and weakness lasted a few days but she reports mild residual weakness. MRI and MRA of head were personally reviewed and were negative for acute stroke. Carotid doppler showed 40-59% bilateral ICA stenosis with antegrade flow in the vertebral arteries. Echo showed EF 60% with no PFO or ASD. She was diagnosed with TIA and started on ASA. She has subsequently had several other similar episodes. CT head and MRI brain from 06/02/12, CT head and MRI of brain from 10/28/12, CT head and MRI brain from 06/02/15, and MRI brain from 10/03/15 were all personally reviewed and were unremarkable. MRI/MRA/MRV of head from 02/28/13 were unremarkable. Carotid doppler from 06/26/16 showed minor carotid atherosclerosis but no hemodynamically significant stenosis.  She has left sided throbbing headache/left retro-orbital without the weakness as well. It is severe and associated with nausea and photophobia. Sometimes preceded by a visual aura of either spotty vision or a yellow strip in her visual field. They last 1 to 2 days. They occur 2 to 3 days per week. She has 25 headache days per month. Triggers:  Dairy,  cigarette smoke Relieving factors:  rest  Activity: Cannot function with severe headache.  She had a hemiplegic migraine on 10/11/17 with left sidedFacial weakness as well asnumbness and tingling of the left face, arm and leg. She went to the ED as a stroke code. MRI of brain was normal. CT perfusion was negative. CTA of head and neck showed mild right ICA origin soft and calcified plaque but no intracranial or extracranial large vessel stenosis or occlusion.  Past NSAIDS: Naproxen (GI upset), ibuprofen, Cambia (GI upset) Past analgesics: Excedrin, Tylenol, Percocet (hives), Norco, tramadol (hot flashes) Past abortive triptans: Relpax Past muscle relaxants: no Past anti-emetic: Phenergan 25mg , Thorazine (effective for migraine abortive therapy but exacerbated hemiplegic migraines) Past antihypertensive medications: verapamil 24hr 180mg , HCTZ, lisinopril, atenolol 50mg  Past antidepressant medications: nortriptyline 50mg  Past anticonvulsant medications: Depakote, zonisamide, topiramate 100mg  twice daily (effective but caused memory problems and paresthesias), gabapentin 300mg  three times daily Past anti-CGRP:  None Past vitamins/Herbal/Supplements: no Other past therapy: Botox (2 rounds)  Family history of headache: mother  Past Medical History: Past Medical History:  Diagnosis Date  . Acne   . Anemia    takes iron supplement  . Anxiety   . Asthma   . Chronic bronchitis ([place])   . Depression   . Family history of adverse reaction to anesthesia    pt's mother has hx. of post-op N/V  . Fibromyalgia   . GERD (gastroesophageal reflux disease)   . History of blood transfusion   . History of TIA (transient ischemic attack)    x 2; left-sided weakness  . Hypercholesterolemia    no current med.  . Hypertension    states under control with meds., has been on med. since 06/2011  . Keloid 07/9620   umbilical  . Migraines   . Osteoarthritis   . Osteoarthritis   . Paget disease of bone   . PONV (postoperative  nausea and vomiting)    states severe  . Seasonal allergies   . Stroke ([place]) 10/27/2012   TIA   1- 10/29/11-weakness left side    Medications: Outpatient Encounter Medications as of 12/15/2018  Medication Sig Note  . albuterol (PROAIR HFA) 108 (90 Base) MCG/ACT inhaler Inhale 2 puffs into the lungs every 6 (six) hours as needed for wheezing or shortness of breath.   Marland Kitchen albuterol (PROVENTIL) (2.5 MG/3ML) 0.083% nebulizer solution Take 2.5 mg by nebulization every 6 (six) hours as needed for wheezing or shortness of breath.   Marland Kitchen aspirin EC 81 MG tablet Take 1 tablet (81 mg total) by mouth 2 (two) times daily.   . Azelaic Acid (FINACEA EX) Apply 1 application topically daily.   . bisoprolol (ZEBETA) 5 MG tablet Take 1 tablet (5 mg total) by mouth daily.   . busPIRone (BUSPAR) 15 MG tablet Take 15 mg by mouth 2 (two) times daily.    . cetirizine (ZYRTEC) 10 MG tablet Take 10 mg by mouth at bedtime.   . Cholecalciferol (VITAMIN D3) 125 MCG (5000 UT) CAPS Take 10,000 Units by mouth See admin instructions. Take 10000 units daily Monday through Friday   . diclofenac sodium (VOLTAREN) 1 % GEL Apply 2 g topically 4 (four) times daily as needed (pain).    Eduard Roux (AIMOVIG) 70 MG/ML SOAJ Inject 70 mg into the skin every 30 (thirty) days. 06/27/2018: Has not started    . ferrous sulfate 325 (65 FE) MG tablet Take 325 mg by mouth at bedtime.    . Galcanezumab-gnlm (EMGALITY) 120 MG/ML SOSY  Inject 120 mg into the skin every 30 (thirty) days. 06/15/2018: Pt was given AIMOVIG to try first because insurance requires her to use AIMOVIG before EMGALITY, hasnt started  . hydrochlorothiazide (MICROZIDE) 12.5 MG capsule Take 12.5 mg by mouth daily.   . hydrocortisone cream 1 % Apply 1 application topically daily as needed for itching.   . mometasone (NASONEX) 50 MCG/ACT nasal spray Place 2 sprays into the nose daily.    . montelukast (SINGULAIR) 10 MG tablet Take 10 mg by mouth at bedtime.   . ondansetron  (ZOFRAN) 8 MG tablet Take 8 mg by mouth every 8 (eight) hours as needed for nausea or vomiting.   Marland Kitchen OVER THE COUNTER MEDICATION Apply 1 application topically daily as needed (acne). Proactive toner/gel   . oxyCODONE-acetaminophen (PERCOCET/ROXICET) 5-325 MG tablet Take 1 tablet by mouth every 4 (four) hours as needed for severe pain.   . pantoprazole (PROTONIX) 40 MG tablet Take 40 mg by mouth 2 (two) times daily before a meal.    . rosuvastatin (CRESTOR) 10 MG tablet Take 10 mg by mouth daily.   . sertraline (ZOLOFT) 100 MG tablet Take 100-200 mg by mouth See admin instructions. Take 100 mg in the am and 200 mg in the pm   . SPRIX 15.75 MG/SPRAY SOLN Place 1 spray into both nostrils every 6 (six) hours as needed (migraines).    Marland Kitchen tiZANidine (ZANAFLEX) 2 MG tablet Take 1 tablet (2 mg total) by mouth every 6 (six) hours as needed.   Marland Kitchen tiZANidine (ZANAFLEX) 4 MG tablet TAKE 2 TABLETS BY MOUTH AT BEDTIME AS NEEDED FOR MUSCLE SPASMS   . topiramate ER (QUDEXY XR) 50 MG CS24 sprinkle capsule Take 1 capsule (50 mg total) by mouth daily. (Patient not taking: Reported on 06/09/2018)   . topiramate ER (QUDEXY XR) CS24 sprinkle capsule Take 1 capsule (100 mg total) by mouth daily. (Patient not taking: Reported on 06/09/2018)   . Topiramate ER (TROKENDI XR) 100 MG CP24 Take 100 mg by mouth at bedtime. Take with 50 mg to equal 150 mg at bedtime   . Topiramate ER (TROKENDI XR) 50 MG CP24 Take 50 mg by mouth at bedtime. Take with 100 mg to equal 150 mg at bedtime   . tretinoin (RETIN-A) 0.05 % cream Apply 1 application topically at bedtime.    No facility-administered encounter medications on file as of 12/15/2018.     Allergies: Allergies  Allergen Reactions  . Lisinopril Swelling and Other (See Comments)    SWELLING FACE/LIPS/NECK  . Latex Rash    Family History: Family History  Problem Relation Age of Onset  . Anesthesia problems Mother        post-op N/V  . Anxiety disorder Father   . Depression  Father   . Cervical cancer Maternal Grandmother   . Cancer Maternal Grandmother        mouth  . Heart attack Maternal Grandmother 55  . Hypertension Brother   . Heart failure Brother        ICD    Social History: Social History   Socioeconomic History  . Marital status: Divorced    Spouse name: Not on file  . Number of children: 2  . Years of education: Not on file  . Highest education level: Not on file  Occupational History  . Occupation: Engineer, maintenance: UNEMPLOYED    Comment: [employer]  Social Needs  . Financial resource strain: Not on file  . Food  insecurity:    Worry: Not on file    Inability: Not on file  . Transportation needs:    Medical: Not on file    Non-medical: Not on file  Tobacco Use  . Smoking status: Former Smoker    Packs/day: 0.50    Years: 15.00    Pack years: 7.50    Types: Cigarettes    Last attempt to quit: 07/20/2004    Years since quitting: 14.4  . Smokeless tobacco: Never Used  Substance and Sexual Activity  . Alcohol use: No  . Drug use: No  . Sexual activity: Yes    Partners: Male  Lifestyle  . Physical activity:    Days per week: Not on file    Minutes per session: Not on file  . Stress: Not on file  Relationships  . Social connections:    Talks on phone: Not on file    Gets together: Not on file    Attends religious service: Not on file    Active member of club or organization: Not on file    Attends meetings of clubs or organizations: Not on file    Relationship status: Not on file  . Intimate partner violence:    Fear of current or ex partner: Not on file    Emotionally abused: Not on file    Physically abused: Not on file    Forced sexual activity: Not on file  Other Topics Concern  . Not on file  Social History Narrative   Lives in [place] with daughter      Servando Snare (c) [phone]   nigel       (c) [phone]   Observations/Objective:   Height 5\' 6"  (1.676 m), weight 238 lb (108 kg), last  menstrual period 10/14/2002. No acute distress.  Alert and oriented. Speech fluent and not dysarthric.  Language intact.   Assessment and Plan:   1.  Hemiplegic migraine, not intractable 2.  Migraines with aura, without status migrainosus, not intractable, improved  1.  For preventative management, Emgality monthly and Trokendi XR 150mg  daily 2.  For abortive therapy, Sprix NS 3. Tizanidine 8mg  at bedtime PRN refilled. 4.  Limit use of pain relievers to no more than 2 days out of week to prevent risk of rebound or medication-overuse headache. 5.  Keep headache diary 6.  Exercise, hydration, caffeine cessation, sleep hygiene, monitor for and avoid triggers 7.  Consider:  magnesium citrate 400mg  daily, riboflavin 400mg  daily, and coenzyme Q10 100mg  three times daily 8. Always keep in mind that currently taking a hormone or birth control may be a possible trigger or aggravating factor for migraine. 9. Follow up in 6 months   Follow Up Instructions:    -I discussed the assessment and treatment plan with the patient. The patient was provided an opportunity to ask questions and all were answered. The patient agreed with the plan and demonstrated an understanding of the instructions.   The patient was advised to call back or seek an in-person evaluation if the symptoms worsen or if the condition fails to improve as anticipated.  25 minutes spent with patient via telephone.  Dudley Major, DO

## 2018-12-15 ENCOUNTER — Encounter: Payer: Self-pay | Admitting: Neurology

## 2018-12-15 ENCOUNTER — Other Ambulatory Visit: Payer: Self-pay

## 2018-12-15 ENCOUNTER — Telehealth (INDEPENDENT_AMBULATORY_CARE_PROVIDER_SITE_OTHER): Payer: Medicare Other | Admitting: Neurology

## 2018-12-15 VITALS — Ht 66.0 in | Wt 238.0 lb

## 2018-12-15 DIAGNOSIS — G43409 Hemiplegic migraine, not intractable, without status migrainosus: Secondary | ICD-10-CM | POA: Diagnosis not present

## 2018-12-15 DIAGNOSIS — G43109 Migraine with aura, not intractable, without status migrainosus: Secondary | ICD-10-CM

## 2018-12-15 MED ORDER — TIZANIDINE HCL 4 MG PO TABS: 8.0000 mg | ORAL_TABLET | Freq: Every evening | ORAL | 1 refills | Status: DC | PRN

## 2018-12-27 ENCOUNTER — Other Ambulatory Visit: Payer: Self-pay | Admitting: Neurology

## 2019-01-11 ENCOUNTER — Other Ambulatory Visit: Payer: Self-pay | Admitting: Internal Medicine

## 2019-02-01 ENCOUNTER — Other Ambulatory Visit (HOSPITAL_BASED_OUTPATIENT_CLINIC_OR_DEPARTMENT_OTHER): Payer: Self-pay | Admitting: Physician Assistant

## 2019-02-01 ENCOUNTER — Ambulatory Visit (HOSPITAL_BASED_OUTPATIENT_CLINIC_OR_DEPARTMENT_OTHER)
Admission: RE | Admit: 2019-02-01 | Discharge: 2019-02-01 | Disposition: A | Discharge status: Home / Self Care | Payer: Medicare Other | Source: Ambulatory Visit | Attending: Physician Assistant | Admitting: Physician Assistant

## 2019-02-01 ENCOUNTER — Other Ambulatory Visit: Payer: Self-pay

## 2019-02-01 DIAGNOSIS — Q899 Congenital malformation, unspecified: Secondary | ICD-10-CM

## 2019-02-01 DIAGNOSIS — L239 Allergic contact dermatitis, unspecified cause: Secondary | ICD-10-CM

## 2019-02-01 DIAGNOSIS — L259 Unspecified contact dermatitis, unspecified cause: Secondary | ICD-10-CM | POA: Insufficient documentation

## 2019-02-08 ENCOUNTER — Other Ambulatory Visit: Payer: Self-pay | Admitting: Nurse Practitioner

## 2019-02-08 DIAGNOSIS — Z1231 Encounter for screening mammogram for malignant neoplasm of breast: Secondary | ICD-10-CM

## 2019-02-28 ENCOUNTER — Telehealth: Payer: Self-pay | Admitting: Neurology

## 2019-02-28 DIAGNOSIS — G43409 Hemiplegic migraine, not intractable, without status migrainosus: Secondary | ICD-10-CM

## 2019-02-28 NOTE — Telephone Encounter (Signed)
Patient is calling in about the Sprix nasal spray for [employer] refill. She is wanting to know what is going on with this. Thanks!

## 2019-03-01 MED ORDER — SPRIX 15.75 MG/SPRAY NA SOLN: 1.0000 | Freq: Four times a day (QID) | NASAL | 5 refills | Status: DC | PRN

## 2019-03-01 NOTE — Telephone Encounter (Signed)
Called [facility] advising Pt to return call. Sprix Rx sent to [facility]

## 2019-03-17 IMAGING — NM NM BONE 3 PHASE
7 series · 17 of 17 positions shown · non-contrast
Comparison: None

Radiographic correlation: None

CLINICAL DATA: RIGHT knee pain, history of BILATERAL knee
replacement surgeries, 11/11/2015 RIGHT and 12/31/2014 LEFT; past
history of Paget's disease, osteoarthritis, fibromyalgia

EXAM:
NUCLEAR MEDICINE 3-PHASE BONE SCAN
TECHNIQUE: Radionuclide angiographic images, immediate static blood pool
images, and 3-hour delayed static images were obtained of the knees
after intravenous injection of radiopharmaceutical.
RADIOPHARMACEUTICALS:  21.5 mCi Nc-YYm MDP IV

[Series 1: flow · 2.07mm/px · 6 of 48 frames shown (1 of 2)]
[frame 5/48  full-range]
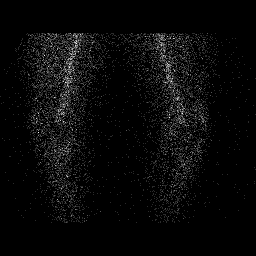
[frame 13/48  full-range]
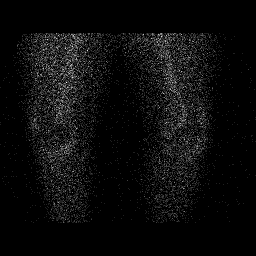
[frame 21/48  full-range]
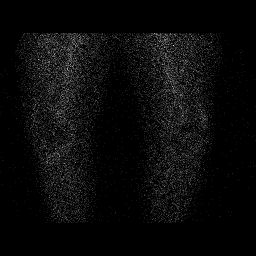
[frame 29/48  full-range]
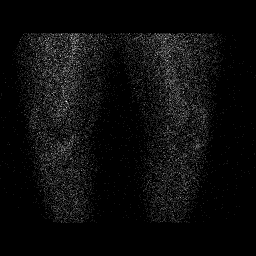
[frame 37/48  full-range]
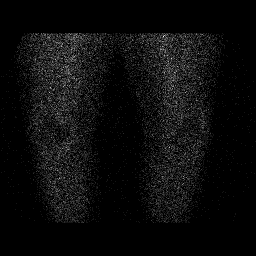
[frame 45/48  full-range]
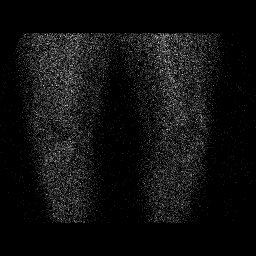

[Series 1: flow · 2.07mm/px · 6 of 48 frames shown (2 of 2)]
[frame 5/48]
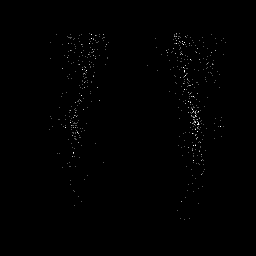
[frame 13/48  full-range]
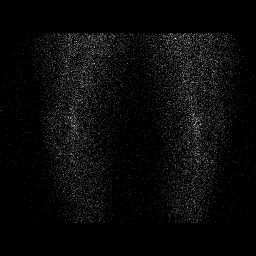
[frame 21/48  full-range]
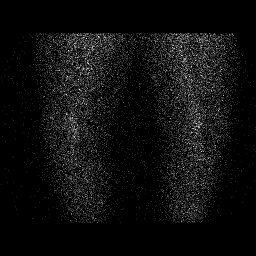
[frame 29/48  full-range]
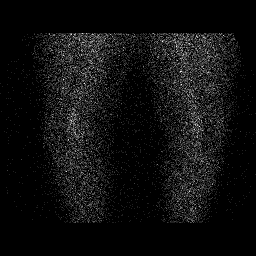
[frame 37/48  full-range]
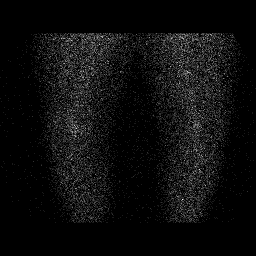
[frame 45/48  full-range]
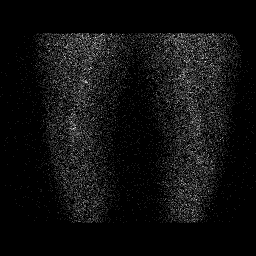

[Series 2: blood pool · 2.07mm/px · 1 of 1 slices shown (1 of 2)]
[im 1/1  full-range]
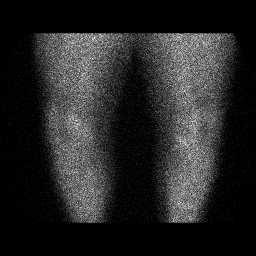

[Series 2: blood pool · 2.07mm/px · 1 of 1 slices shown (2 of 2)]
[im 1/1  full-range]
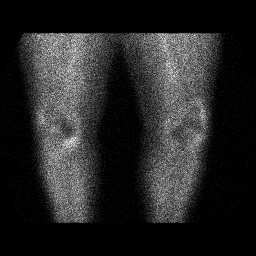

[Series 3: lat bp · 2.07mm/px · 1 of 1 slices shown (1 of 2)]
[im 1/1  full-range]
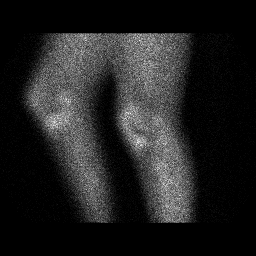

[Series 3: lat bp · 2.07mm/px · 1 of 1 slices shown (2 of 2)]
[im 1/1  full-range]
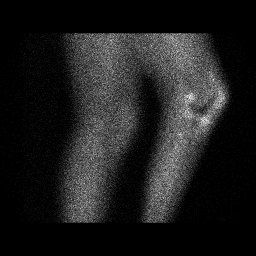

[Series 5: delay · delayed · 2.07mm/px · 1 of 1 slices shown]
[im 1/1]
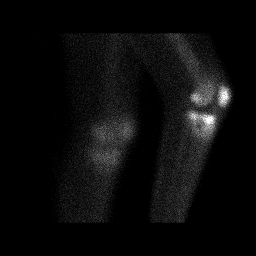

[17 of 17 positions shown; findings below may reference images not displayed]

FINDINGS: Vascular phase: Minimally increased blood flow to the LEFT knee
versus RIGHT

Blood pool phase: Mildly increased blood pool at both knees

Delayed phase: Focally increased delayed tracer localization at the
RIGHT medial tibial plateau adjacent to the prosthesis concerning
for aseptic loosening or infection of the RIGHT knee prosthesis. No
abnormal uptake seen adjacent to the remaining portions of the RIGHT
knee prosthesis.

On the LEFT, increased trace localization is identified at the
lateral tibial plateau adjacent to the prosthesis. In the absence of
symptoms, this is nonspecific.
IMPRESSION: Mildly increased blood flow to the LEFT knee with increased blood
pool at both knees.

Increased focal tracer localization at the medial RIGHT tibial
plateau adjacent to the prosthesis concerning for prosthetic
loosening or infection.

Mildly increased tracer localization at the LEFT lateral tibial
plateau, non symptomatic site, nonspecific.

## 2019-03-23 ENCOUNTER — Ambulatory Visit
Admission: RE | Admit: 2019-03-23 | Discharge: 2019-03-23 | Disposition: A | Discharge status: Home / Self Care | Payer: Medicare Other | Source: Ambulatory Visit | Attending: Nurse Practitioner | Admitting: Nurse Practitioner

## 2019-03-23 ENCOUNTER — Other Ambulatory Visit: Payer: Self-pay

## 2019-03-23 DIAGNOSIS — Z1231 Encounter for screening mammogram for malignant neoplasm of breast: Secondary | ICD-10-CM

## 2019-04-10 ENCOUNTER — Other Ambulatory Visit: Payer: Self-pay | Admitting: Orthopedic Surgery

## 2019-04-10 DIAGNOSIS — M25511 Pain in right shoulder: Secondary | ICD-10-CM

## 2019-04-18 ENCOUNTER — Ambulatory Visit
Admission: RE | Admit: 2019-04-18 | Discharge: 2019-04-18 | Disposition: A | Discharge status: Home / Self Care | Payer: Medicare Other | Source: Ambulatory Visit | Attending: Orthopedic Surgery | Admitting: Orthopedic Surgery

## 2019-04-18 ENCOUNTER — Other Ambulatory Visit: Payer: Self-pay

## 2019-04-18 DIAGNOSIS — M25511 Pain in right shoulder: Secondary | ICD-10-CM

## 2019-04-20 ENCOUNTER — Other Ambulatory Visit: Payer: Medicare Other

## 2019-04-25 ENCOUNTER — Other Ambulatory Visit: Payer: Self-pay | Admitting: Neurosurgery

## 2019-04-25 DIAGNOSIS — M48062 Spinal stenosis, lumbar region with neurogenic claudication: Secondary | ICD-10-CM

## 2019-04-26 ENCOUNTER — Other Ambulatory Visit: Payer: Self-pay

## 2019-04-26 ENCOUNTER — Ambulatory Visit
Admission: RE | Admit: 2019-04-26 | Discharge: 2019-04-26 | Disposition: A | Discharge status: Home / Self Care | Payer: Medicare Other | Source: Ambulatory Visit | Attending: Neurosurgery | Admitting: Neurosurgery

## 2019-04-26 DIAGNOSIS — M48062 Spinal stenosis, lumbar region with neurogenic claudication: Secondary | ICD-10-CM

## 2019-06-18 NOTE — Progress Notes (Signed)
Virtual Visit via Video Note The purpose of this virtual visit is to provide medical care while limiting exposure to the novel coronavirus.    Consent was obtained for video visit:  Yes Answered questions that patient had about telehealth interaction:  Yes I discussed the limitations, risks, security and privacy concerns of performing an evaluation and management service by telemedicine. I also discussed with the patient that there may be a patient responsible charge related to this service. The patient expressed understanding and agreed to proceed.  Pt location: Home Physician Location: office Name of referring provider:  Leighton Ruff, MD I connected with Festus Barren at patients initiation/request on 06/19/2019 at  2:30 PM EST by video enabled telemedicine application and verified that I am speaking with the correct person using two identifiers. Pt MRN:  [id number] Pt DOB:  1966-04-07 Video Participants:  Festus Barren   History of Present Illness:  Wanda [name] is a 53 year old right-handed woman with Paget's disease, osteoarthritis, fibromyalgia, GERD, IBS, hyperlipidemia, carotid artery disease and bilateral knee replacements who follows up for migraine and hemiplegic migraine.  UPDATE: Intensity:  moderate Duration:Usually 1-2 hours Frequency:once a week to once every other week. Frequency of abortive medication:1 day a week Rescue:Sprix NS Current NSAIDS:ASA 81mg ; Sprix nasal spray Current analgesics:Hydrocodone (for pain related to Paget's) Current triptans:None Current ergotamine:None Current anti-emetic:Zofran 8 mg Current muscle relaxants:Tizanidine 8 mg at bedtime Current anti-anxiolytic:BuSpar Current sleep aide:none Current Antihypertensive medications:Zepeta Current Antidepressant medications:Sertraline 100 mg Current Anticonvulsant medications:Trokendi XR 150 mg Current anti-CGRP:Emgality Current  Vitamins/Herbal/Supplements:Ferrous sulfate Current Antihistamines/Decongestants:None Other therapy:Dry needling Hormone: None  Caffeine:No Alcohol:No Smoker:No Diet:Hydrates, no fried foods or dairy, no sweets or soda Exercise:Walks twice daily with her dog Depression:Yes; Anxiety:Yes (affected by pain) Other pain:Right rotator cuff surgery 3 weeks ago.  Pain related to fibromyalgia Sleep hygiene:Fair  HISTORY: On 10/28/11, she was hospitalized for severe left-sided headache associated with slurred speech, left facial droop and left sided arm and leg weakness and numbness. Headache lasted a day and weakness lasted a few days but she reports mild residual weakness. MRI and MRA of head were personally reviewed and were negative for acute stroke. Carotid doppler showed 40-59% bilateral ICA stenosis with antegrade flow in the vertebral arteries. Echo showed EF 60% with no PFO or ASD. She was diagnosed with TIA and started on ASA. She has subsequently had several other similar episodes. CT head and MRI brain from 06/02/12, CT head and MRI of brain from 10/28/12, CT head and MRI brain from 06/02/15, and MRI brain from 10/03/15 were all personally reviewed and were unremarkable. MRI/MRA/MRV of head from 02/28/13 were unremarkable. Carotid doppler from 06/26/16 showed minor carotid atherosclerosis but no hemodynamically significant stenosis.  She has left sidedthrobbingheadache/left retro-orbital without the weakness as well. It is severe and associated with nausea and photophobia. Sometimes preceded by a visual aura of either spotty vision or a yellow strip in her visual field. They last 1 to 2 days. They occur 2 to 3 days per week. She has 25 headache days per month. Triggers:  Dairy, cigarette smoke Relieving factors:  rest Activity: Cannot function with severe headache.  She had a hemiplegic migraine on 10/11/17 with left sidedFacial weakness as well asnumbness and tingling  of the left face, arm and leg. She went to the ED as a stroke code. MRI of brain was normal. CT perfusion was negative. CTA of head and neck showed mild right ICA origin soft and calcified plaque but no  intracranial or extracranial large vessel stenosis or occlusion.  Past NSAIDS: Naproxen (GI upset), ibuprofen, Cambia (GI upset) Past analgesics: Excedrin, Tylenol, Percocet (hives), Norco, tramadol (hot flashes) Past abortive triptans: Relpax Past muscle relaxants: no Past anti-emetic: Phenergan 25mg , Thorazine (effective for migraine abortive therapy but exacerbated hemiplegic migraines) Past antihypertensive medications: verapamil 24hr 180mg , HCTZ, lisinopril, atenolol 50mg  Past antidepressant medications: nortriptyline 50mg  Past anticonvulsant medications: Depakote, zonisamide, topiramate 100mg  twice daily (effective but caused memory problems and paresthesias), gabapentin 300mg  three times daily Past anti-CGRP: None Past vitamins/Herbal/Supplements: no Other past therapy: Botox (2 rounds)  Family history of headache: mother  Past Medical History: Past Medical History:  Diagnosis Date  . Acne   . Anemia    takes iron supplement  . Anxiety   . Asthma   . Chronic bronchitis (Post)   . Depression   . Family history of adverse reaction to anesthesia    pt's mother has hx. of post-op N/V  . Fibromyalgia   . GERD (gastroesophageal reflux disease)   . History of blood transfusion   . History of TIA (transient ischemic attack)    x 2; left-sided weakness  . Hypercholesterolemia    no current med.  . Hypertension    states under control with meds., has been on med. since 06/2011  . Keloid [id number]   umbilical  . Migraines   . Osteoarthritis   . Osteoarthritis   . Paget disease of bone   . PONV (postoperative nausea and vomiting)    states severe  . Seasonal allergies   . Stroke ([place]) 10/27/2012   TIA   1- 10/29/11-weakness left side    Medications: Outpatient  Encounter Medications as of 06/19/2019  Medication Sig  . albuterol (PROAIR HFA) 108 (90 Base) MCG/ACT inhaler Inhale 2 puffs into the lungs every 6 (six) hours as needed for wheezing or shortness of breath.  Marland Kitchen albuterol (PROVENTIL) (2.5 MG/3ML) 0.083% nebulizer solution Take 2.5 mg by nebulization every 6 (six) hours as needed for wheezing or shortness of breath.  Marland Kitchen aspirin EC 81 MG tablet Take 1 tablet (81 mg total) by mouth 2 (two) times daily.  . Azelaic Acid (FINACEA EX) Apply 1 application topically daily.  . bisoprolol (ZEBETA) 5 MG tablet TAKE 1 TABLET(5 MG) BY MOUTH DAILY  . busPIRone (BUSPAR) 15 MG tablet Take 15 mg by mouth 2 (two) times daily.   . cetirizine (ZYRTEC) 10 MG tablet Take 10 mg by mouth at bedtime.  . Cholecalciferol (VITAMIN D3) 125 MCG (5000 UT) CAPS Take 10,000 Units by mouth See admin instructions. Take 10000 units daily Monday through Friday  . diclofenac sodium (VOLTAREN) 1 % GEL Apply 2 g topically 4 (four) times daily as needed (pain).   . ferrous sulfate 325 (65 FE) MG tablet Take 325 mg by mouth at bedtime.   . Galcanezumab-gnlm (EMGALITY) 120 MG/ML SOSY Inject 120 mg into the skin every 30 (thirty) days.  . hydrochlorothiazide (MICROZIDE) 12.5 MG capsule Take 12.5 mg by mouth daily.  . hydrocortisone cream 1 % Apply 1 application topically daily as needed for itching.  Marland Kitchen Ketorolac Tromethamine (SPRIX) 15.75 MG/SPRAY SOLN Place 1 spray into the nose every 6 (six) hours as needed. Max daily dose of 4 doses  . mometasone (NASONEX) 50 MCG/ACT nasal spray Place 2 sprays into the nose daily.   . montelukast (SINGULAIR) 10 MG tablet Take 10 mg by mouth at bedtime.  . ondansetron (ZOFRAN) 8 MG tablet Take 8 mg by mouth every 8 (  eight) hours as needed for nausea or vomiting.  Marland Kitchen OVER THE COUNTER MEDICATION Apply 1 application topically daily as needed (acne). Proactive toner/gel  . oxyCODONE-acetaminophen (PERCOCET/ROXICET) 5-325 MG tablet Take 1 tablet by mouth every 4  (four) hours as needed for severe pain. (Patient not taking: Reported on 12/15/2018)  . pantoprazole (PROTONIX) 40 MG tablet Take 40 mg by mouth 2 (two) times daily before a meal.   . rosuvastatin (CRESTOR) 10 MG tablet Take 20 mg by mouth daily.   . sertraline (ZOLOFT) 100 MG tablet Take 100-200 mg by mouth See admin instructions. Take 100 mg in the am and 200 mg in the pm  . SPRIX 15.75 MG/SPRAY SOLN Place 1 spray into both nostrils every 6 (six) hours as needed (migraines).   Marland Kitchen tiZANidine (ZANAFLEX) 4 MG tablet Take 2 tablets (8 mg total) by mouth at bedtime as needed for muscle spasms.  Marland Kitchen tretinoin (RETIN-A) 0.05 % cream Apply 1 application topically at bedtime.  Marland Kitchen TROKENDI XR 100 MG CP24 TAKE 1 CAPSULE BY MOUTH AT BEDTIME  . TROKENDI XR 50 MG CP24 TAKE 1 CAPSULE BY MOUTH ONCE A DAY   No facility-administered encounter medications on file as of 06/19/2019.     Allergies: Allergies  Allergen Reactions  . Lisinopril Swelling and Other (See Comments)    SWELLING FACE/LIPS/NECK  . Latex Rash    Family History: Family History  Problem Relation Age of Onset  . Anesthesia problems Mother        post-op N/V  . Anxiety disorder Father   . Depression Father   . Cervical cancer Maternal Grandmother   . Cancer Maternal Grandmother        mouth  . Heart attack Maternal Grandmother 55  . Hypertension Brother   . Heart failure Brother        ICD    Social History: Social History   Socioeconomic History  . Marital status: Divorced    Spouse name: Not on file  . Number of children: 2  . Years of education: Not on file  . Highest education level: Not on file  Occupational History  . Occupation: Engineer, maintenance: UNEMPLOYED    Comment: [employer]  Social Needs  . Financial resource strain: Not on file  . Food insecurity    Worry: Not on file    Inability: Not on file  . Transportation needs    Medical: Not on file    Non-medical: Not on file  Tobacco Use  .  Smoking status: Former Smoker    Packs/day: 0.50    Years: 15.00    Pack years: 7.50    Types: Cigarettes    Quit date: 07/20/2004    Years since quitting: 14.9  . Smokeless tobacco: Never Used  Substance and Sexual Activity  . Alcohol use: No  . Drug use: No  . Sexual activity: Yes    Partners: Male  Lifestyle  . Physical activity    Days per week: Not on file    Minutes per session: Not on file  . Stress: Not on file  Relationships  . Social Herbalist on phone: Not on file    Gets together: Not on file    Attends religious service: Not on file    Active member of club or organization: Not on file    Attends meetings of clubs or organizations: Not on file    Relationship status: Not on file  .  Intimate partner violence    Fear of current or ex partner: Not on file    Emotionally abused: Not on file    Physically abused: Not on file    Forced sexual activity: Not on file  Other Topics Concern  . Not on file  Social History Narrative   Lives in [place] with daughter      Servando Snare (c) [phone]   nigel       (c) [phone]    Observations/Objective:   Height 5\' 6"  (1.676 m), weight 231 lb (104.8 kg), last menstrual period 10/14/2002. No acute distress.  Alert and oriented.  Speech fluent and not dysarthric.  Language intact.  Eyes orthophoric on primary gaze.  Face symmetric.  Assessment and Plan:   1.  Hemiplegic migraine, not intractable 2.  Migraine with aura, without status migrainosus, not intractable   1.  For preventative management, Emgality monthly and Trokendi XR 150mg  daily 2.  For abortive therapy, Sprix NS 3. Tizanidine 8mg  at bedtime PRN refilled. 4.  Limit use of pain relievers to no more than 2 days out of week to prevent risk of rebound or medication-overuse headache. 5.  Keep headache diary 6.  Exercise, hydration, caffeine cessation, sleep hygiene, monitor for and avoid triggers 7.  Consider:  magnesium citrate 400mg  daily, riboflavin  400mg  daily, and coenzyme Q10 100mg  three times daily 8. Follow up in 6 months   Follow Up Instructions:    -I discussed the assessment and treatment plan with the patient. The patient was provided an opportunity to ask questions and all were answered. The patient agreed with the plan and demonstrated an understanding of the instructions.   The patient was advised to call back or seek an in-person evaluation if the symptoms worsen or if the condition fails to improve as anticipated.    Dudley Major, DO

## 2019-06-19 ENCOUNTER — Other Ambulatory Visit: Payer: Self-pay

## 2019-06-19 ENCOUNTER — Telehealth (INDEPENDENT_AMBULATORY_CARE_PROVIDER_SITE_OTHER): Payer: Medicare Other | Admitting: Neurology

## 2019-06-19 ENCOUNTER — Encounter: Payer: Self-pay | Admitting: Neurology

## 2019-06-19 VITALS — Ht 66.0 in | Wt 231.0 lb

## 2019-06-19 DIAGNOSIS — G43109 Migraine with aura, not intractable, without status migrainosus: Secondary | ICD-10-CM | POA: Diagnosis not present

## 2019-06-19 DIAGNOSIS — G43409 Hemiplegic migraine, not intractable, without status migrainosus: Secondary | ICD-10-CM

## 2019-06-20 ENCOUNTER — Other Ambulatory Visit: Payer: Self-pay | Admitting: Neurology

## 2019-07-03 ENCOUNTER — Other Ambulatory Visit: Payer: Self-pay | Admitting: Neurology

## 2019-07-03 NOTE — Telephone Encounter (Signed)
Requested Prescriptions   Pending Prescriptions Disp Refills  . TROKENDI XR 50 MG CP24 [Pharmacy Med Name: Trokendi XR 50 mg capsule, extended release] 90 capsule 1    Sig: TAKE ONE CAPSULE BY MOUTH AT BEDTIME  . tiZANidine (ZANAFLEX) 4 MG tablet [Pharmacy Med Name: tizanidine 4 mg tablet] 180 tablet 1    Sig: TAKE TWO TABLETS BY MOUTH AT BEDTIME FOR SPASMS   Rx last filled:12/27/18 #90 0 refills 12/15/18 #180 1 refills  Pt last seen:06/19/19   Follow up appt scheduled:12/17/19

## 2019-07-28 DIAGNOSIS — G4730 Sleep apnea, unspecified: Secondary | ICD-10-CM

## 2019-07-28 HISTORY — DX: Sleep apnea, unspecified: G47.30

## 2019-08-01 ENCOUNTER — Other Ambulatory Visit: Payer: Self-pay | Admitting: Cardiology

## 2019-08-01 DIAGNOSIS — Z20822 Contact with and (suspected) exposure to covid-19: Secondary | ICD-10-CM

## 2019-08-03 LAB — NOVEL CORONAVIRUS, NAA: SARS-CoV-2, NAA: NOT DETECTED

## 2019-08-07 NOTE — Progress Notes (Unsigned)
Emgality sent to cover my meds, for [employer] medicare Part d electronic prior authorization, rx [phone] for migraine headaches. Key :[id number]

## 2019-08-08 NOTE — Progress Notes (Unsigned)
Approved through 07/26/20 [id number]

## 2019-08-23 ENCOUNTER — Other Ambulatory Visit: Payer: Self-pay | Admitting: Neurology

## 2019-11-23 ENCOUNTER — Other Ambulatory Visit: Payer: Self-pay | Admitting: Neurosurgery

## 2019-11-23 DIAGNOSIS — M533 Sacrococcygeal disorders, not elsewhere classified: Secondary | ICD-10-CM

## 2019-12-08 ENCOUNTER — Ambulatory Visit
Admission: RE | Admit: 2019-12-08 | Discharge: 2019-12-08 | Disposition: A | Discharge status: Home / Self Care | Payer: Medicare Other | Source: Ambulatory Visit | Attending: Neurosurgery | Admitting: Neurosurgery

## 2019-12-08 ENCOUNTER — Other Ambulatory Visit: Payer: Self-pay

## 2019-12-08 DIAGNOSIS — M533 Sacrococcygeal disorders, not elsewhere classified: Secondary | ICD-10-CM

## 2019-12-19 NOTE — Progress Notes (Signed)
Virtual Visit via Video Note The purpose of this virtual visit is to provide medical care while limiting exposure to the novel coronavirus.    Consent was obtained for video visit:  Yes.   Answered questions that patient had about telehealth interaction:  Yes.   I discussed the limitations, risks, security and privacy concerns of performing an evaluation and management service by telemedicine. I also discussed with the patient that there may be a patient responsible charge related to this service. The patient expressed understanding and agreed to proceed.  Pt location: Home Physician Location: office Name of referring provider:  Leighton Ruff, MD I connected with Wanda [name] at patients initiation/request on 12/20/2019 at 10:50 AM EDT by video enabled telemedicine application and verified that I am speaking with the correct person using two identifiers. Pt MRN:  [id number] Pt DOB:  1965-12-13 Video Participants:  Wanda [name]   History of Present Illness:  Wanda [name] is a 54 year old right-handed womanwith Paget's disease, osteoarthritis, fibromyalgia, GERD, IBS, hyperlipidemia, carotid artery disease and bilateral knee replacements who follows up for migraine and hemiplegic migraine.  UPDATE: Intensity:  moderate to severe Duration:Usually 1-2 hours Frequency:once a week to once every other week. Frequency of abortive medication:1 day a week Rescue:Sprix NS with Benadryl Current NSAIDS:ASA 81mg ; Sprix nasal spray Current analgesics:Hydrocodone (for pain related to Paget's) Current triptans:None Current ergotamine:None Current anti-emetic:Zofran 8 mg Current muscle relaxants:Tizanidine 8 mg at bedtime Current anti-anxiolytic:BuSpar Current sleep aide:Latuda Current Antihypertensive medications:Zepeta Current Antidepressant medications:Sertraline 100 mg Current Anticonvulsant medications:Trokendi XR 150 mg Current  anti-CGRP:Emgality Current Vitamins/Herbal/Supplements:Ferrous sulfate Current Antihistamines/Decongestants:Benadryl Other therapy:Dry needling Hormone: None  Caffeine:No Alcohol:No Smoker:No Diet:Hydrates, no fried foods or dairy, no sweets or soda Exercise:Walks twice daily with her dog Depression:Right rotator cuff surgery 3 weeks ago.  Yes; Anxiety:Yes (affected by pain) Other pain:Pain related to fibromyalgia Sleep hygiene:Fair  HISTORY: On 10/28/11, she was hospitalized for severe left-sided headache associated with slurred speech, left facial droop and left sided arm and leg weakness and numbness. Headache lasted a day and weakness lasted a few days but she reports mild residual weakness. MRI and MRA of head were personally reviewed and were negative for acute stroke. Carotid doppler showed 40-59% bilateral ICA stenosis with antegrade flow in the vertebral arteries. Echo showed EF 60% with no PFO or ASD. She was diagnosed with TIA and started on ASA. She has subsequently had several other similar episodes. CT head and MRI brain from 06/02/12, CT head and MRI of brain from 10/28/12, CT head and MRI brain from 06/02/15, and MRI brain from 10/03/15 were all personally reviewed and were unremarkable. MRI/MRA/MRV of head from 02/28/13 were unremarkable. Carotid doppler from 06/26/16 showed minor carotid atherosclerosis but no hemodynamically significant stenosis.  She has left sidedthrobbingheadache/left retro-orbitalwithout the weakness as well. It is severe andassociated with nausea and photophobia. Sometimes preceded by a visual aura of either spotty vision or a yellow strip in her visual field.They last 1 to 2 days. They occur 2 to 3 days per week. She has 25 headache days per month. Triggers: Dairy, cigarette smoke Relieving factors: rest Activity: Cannot function with severe headache.  She had a hemiplegic migraine on 10/11/17 with left sidedFacial weakness  as well asnumbness and tingling of the left face, arm and leg. She went to the ED as a stroke code. MRI of brain was normal. CT perfusion was negative. CTA of head and neck showed mild right ICA origin soft and calcified plaque but  no intracranial or extracranial large vessel stenosis or occlusion.  Past NSAIDS: Naproxen (GI upset), ibuprofen, Cambia (GI upset) Past analgesics: Excedrin, Tylenol, Percocet (hives), Norco, tramadol (hot flashes) Past abortive triptans: Relpax Past muscle relaxants: no Past anti-emetic: Phenergan 25mg , Thorazine (effective for migraine abortive therapy but exacerbated hemiplegic migraines) Past antihypertensive medications: verapamil 24hr 180mg , HCTZ, lisinopril, atenolol 50mg  Past antidepressant medications: nortriptyline 50mg  Past anticonvulsant medications: Depakote, zonisamide, topiramate 100mg  twice daily (effective but caused memory problems and paresthesias), gabapentin 300mg  three times daily Past anti-CGRP: None Past vitamins/Herbal/Supplements: no Other past therapy: Botox (2 rounds)  Family history of headache: mother  Past Medical History: Past Medical History:  Diagnosis Date  . Acne   . Anemia    takes iron supplement  . Anxiety   . Asthma   . Chronic bronchitis ([place])   . Depression   . Family history of adverse reaction to anesthesia    pt's mother has hx. of post-op N/V  . Fibromyalgia   . GERD (gastroesophageal reflux disease)   . History of blood transfusion   . History of TIA (transient ischemic attack)    x 2; left-sided weakness  . Hypercholesterolemia    no current med.  . Hypertension    states under control with meds., has been on med. since 06/2011  . Keloid [id number]   umbilical  . Migraines   . Osteoarthritis   . Osteoarthritis   . Paget disease of bone   . PONV (postoperative nausea and vomiting)    states severe  . Seasonal allergies   . Stroke ([place]) 10/27/2012   TIA   1- 10/29/11-weakness left side      Medications: Outpatient Encounter Medications as of 12/20/2019  Medication Sig  . albuterol (PROAIR HFA) 108 (90 Base) MCG/ACT inhaler Inhale 2 puffs into the lungs every 6 (six) hours as needed for wheezing or shortness of breath.  Marland Kitchen albuterol (PROVENTIL) (2.5 MG/3ML) 0.083% nebulizer solution Take 2.5 mg by nebulization every 6 (six) hours as needed for wheezing or shortness of breath.  Marland Kitchen aspirin EC 81 MG tablet Take 1 tablet (81 mg total) by mouth 2 (two) times daily.  . Azelaic Acid (FINACEA EX) Apply 1 application topically daily.  . bisoprolol (ZEBETA) 5 MG tablet TAKE 1 TABLET(5 MG) BY MOUTH DAILY  . busPIRone (BUSPAR) 15 MG tablet Take 15 mg by mouth 2 (two) times daily.   . cetirizine (ZYRTEC) 10 MG tablet Take 10 mg by mouth at bedtime.  . Cholecalciferol (VITAMIN D3) 125 MCG (5000 UT) CAPS Take 10,000 Units by mouth See admin instructions. Take 10000 units daily Monday through Friday  . diclofenac sodium (VOLTAREN) 1 % GEL Apply 2 g topically 4 (four) times daily as needed (pain).   Marland Kitchen EMGALITY 120 MG/ML SOAJ INJECT (1ML) 120MG  UNDER SKIN EVERY 30 DAYS  . ferrous sulfate 325 (65 FE) MG tablet Take 325 mg by mouth at bedtime.   . Galcanezumab-gnlm (EMGALITY) 120 MG/ML SOSY Inject 120 mg into the skin every 30 (thirty) days.  . hydrochlorothiazide (MICROZIDE) 12.5 MG capsule Take 12.5 mg by mouth daily.  . hydrocortisone cream 1 % Apply 1 application topically daily as needed for itching.  Marland Kitchen Ketorolac Tromethamine (SPRIX) 15.75 MG/SPRAY SOLN Place 1 spray into the nose every 6 (six) hours as needed. Max daily dose of 4 doses  . mometasone (NASONEX) 50 MCG/ACT nasal spray Place 2 sprays into the nose daily.   . montelukast (SINGULAIR) 10 MG tablet Take 10 mg by mouth  at bedtime.  . ondansetron (ZOFRAN) 8 MG tablet Take 8 mg by mouth every 8 (eight) hours as needed for nausea or vomiting.  Marland Kitchen OVER THE COUNTER MEDICATION Apply 1 application topically daily as needed (acne). Proactive  toner/gel  . oxyCODONE-acetaminophen (PERCOCET/ROXICET) 5-325 MG tablet Take 1 tablet by mouth every 4 (four) hours as needed for severe pain.  . pantoprazole (PROTONIX) 40 MG tablet Take 40 mg by mouth 2 (two) times daily before a meal.   . rosuvastatin (CRESTOR) 10 MG tablet Take 20 mg by mouth daily.   . sertraline (ZOLOFT) 100 MG tablet Take 100-200 mg by mouth See admin instructions. Take 100 mg in the am and 200 mg in the pm  . SPRIX 15.75 MG/SPRAY SOLN Place 1 spray into both nostrils every 6 (six) hours as needed (migraines).   Marland Kitchen tiZANidine (ZANAFLEX) 4 MG tablet TAKE TWO TABLETS BY MOUTH AT BEDTIME FOR SPASMS  . tretinoin (RETIN-A) 0.05 % cream Apply 1 application topically at bedtime.  Marland Kitchen TROKENDI XR 100 MG CP24 TAKE ONE CAPSULE BY MOUTH AT BEDTIME  . TROKENDI XR 50 MG CP24 TAKE ONE CAPSULE BY MOUTH AT BEDTIME   No facility-administered encounter medications on file as of 12/20/2019.    Allergies: Allergies  Allergen Reactions  . Lisinopril Swelling and Other (See Comments)    SWELLING FACE/LIPS/NECK  . Latex Rash    Family History: Family History  Problem Relation Age of Onset  . Anesthesia problems Mother        post-op N/V  . Anxiety disorder Father   . Depression Father   . Cervical cancer Maternal Grandmother   . Cancer Maternal Grandmother        mouth  . Heart attack Maternal Grandmother 55  . Hypertension Brother   . Heart failure Brother        ICD    Social History: Social History   Socioeconomic History  . Marital status: Divorced    Spouse name: Not on file  . Number of children: 2  . Years of education: Not on file  . Highest education level: Bachelor's degree (e.g., BA, AB, BS)  Occupational History  . Occupation: Engineer, maintenance: UNEMPLOYED    Comment: [employer]  Tobacco Use  . Smoking status: Former Smoker    Packs/day: 0.50    Years: 15.00    Pack years: 7.50    Types: Cigarettes    Quit date: 07/20/2004    Years  since quitting: 15.4  . Smokeless tobacco: Never Used  Substance and Sexual Activity  . Alcohol use: No  . Drug use: No  . Sexual activity: Yes    Partners: Male  Other Topics Concern  . Not on file  Social History Narrative   Lives in [place] with daughter      Servando Snare (c) [phone]   nigel       (c) [phone]         Divorced   2 children   Right handed   Drinks coffee every other a day, soda sometimes, no tea   Social Determinants of Radio broadcast assistant Strain:   . Difficulty of Paying Living Expenses:   Food Insecurity:   . Worried About Charity fundraiser in the Last Year:   . Arboriculturist in the Last Year:   Transportation Needs:   . Film/video editor (Medical):   Marland Kitchen Lack of Transportation (Non-Medical):   Physical Activity:   .  Days of Exercise per Week:   . Minutes of Exercise per Session:   Stress:   . Feeling of Stress :   Social Connections:   . Frequency of Communication with Friends and Family:   . Frequency of Social Gatherings with Friends and Family:   . Attends Religious Services:   . Active Member of Clubs or Organizations:   . Attends Archivist Meetings:   Marland Kitchen Marital Status:   Intimate Partner Violence:   . Fear of Current or Ex-Partner:   . Emotionally Abused:   Marland Kitchen Physically Abused:   . Sexually Abused:     Observations/Objective:   Last menstrual period 10/14/2002. No acute distress.  Alert and oriented.  Speech fluent and not dysarthric.  Language intact.  Eyes orthophoric on primary gaze.  Face symmetric.  Assessment and Plan:   1.  Hemiplegic migraine, not intractable 2.  Migraine with aura, without status migrainosus, not intractable  1.  For preventative management:  Emgality monthly; Trokendi XR 150mg  daily 2.  For abortive therapy:  Sprix NS 3.  Tizanidine 8mg  at bedtime PRN 4.  Limit use of pain relievers to no more than 2 days out of week to prevent risk of rebound or medication-overuse headache. 5.   Keep headache diary 6.  Exercise, hydration, caffeine cessation, sleep hygiene, monitor for and avoid triggers 7. Follow up 6 months   Follow Up Instructions:    -I discussed the assessment and treatment plan with the patient. The patient was provided an opportunity to ask questions and all were answered. The patient agreed with the plan and demonstrated an understanding of the instructions.   The patient was advised to call back or seek an in-person evaluation if the symptoms worsen or if the condition fails to improve as anticipated.    Dudley Major, DO

## 2019-12-20 ENCOUNTER — Encounter: Payer: Self-pay | Admitting: Neurology

## 2019-12-20 ENCOUNTER — Telehealth (INDEPENDENT_AMBULATORY_CARE_PROVIDER_SITE_OTHER): Payer: Medicare Other | Admitting: Neurology

## 2019-12-20 ENCOUNTER — Other Ambulatory Visit: Payer: Self-pay

## 2019-12-20 DIAGNOSIS — G43409 Hemiplegic migraine, not intractable, without status migrainosus: Secondary | ICD-10-CM | POA: Diagnosis not present

## 2019-12-20 DIAGNOSIS — G43109 Migraine with aura, not intractable, without status migrainosus: Secondary | ICD-10-CM

## 2019-12-21 ENCOUNTER — Other Ambulatory Visit: Payer: Self-pay | Admitting: Neurology

## 2020-02-09 ENCOUNTER — Encounter: Payer: Self-pay | Admitting: Internal Medicine

## 2020-02-09 ENCOUNTER — Other Ambulatory Visit: Payer: Self-pay

## 2020-02-09 ENCOUNTER — Ambulatory Visit: Payer: Medicare Other | Admitting: Internal Medicine

## 2020-02-09 DIAGNOSIS — J45991 Cough variant asthma: Secondary | ICD-10-CM | POA: Diagnosis not present

## 2020-02-09 LAB — CBC WITH DIFFERENTIAL/PLATELET
Basophils Absolute: 0.1 10*3/uL (ref 0.0–0.1)
Basophils Relative: 1 % (ref 0.0–3.0)
Eosinophils Absolute: 0.3 10*3/uL (ref 0.0–0.7)
Eosinophils Relative: 3.1 % (ref 0.0–5.0)
HCT: 37.5 % (ref 36.0–46.0)
Hemoglobin: 12.6 g/dL (ref 12.0–15.0)
Lymphocytes Relative: 30.1 % (ref 12.0–46.0)
Lymphs Abs: 2.4 10*3/uL (ref 0.7–4.0)
MCHC: 33.5 g/dL (ref 30.0–36.0)
MCV: 87.3 fl (ref 78.0–100.0)
Monocytes Absolute: 0.5 10*3/uL (ref 0.1–1.0)
Monocytes Relative: 5.6 % (ref 3.0–12.0)
Neutro Abs: 4.8 10*3/uL (ref 1.4–7.7)
Neutrophils Relative %: 60.2 % (ref 43.0–77.0)
Platelets: 181 10*3/uL (ref 150.0–400.0)
RBC: 4.3 Mil/uL (ref 3.87–5.11)
RDW: 14.1 % (ref 11.5–15.5)
WBC: 8.1 10*3/uL (ref 4.0–10.5)

## 2020-02-09 NOTE — Progress Notes (Signed)
Subjective:    Patient ID: Wanda [name], female    DOB: 10-18-1965, 54 y.o.   MRN: [id number]  Brief patient profile:  [id number]   quit smoking 2005, with a history of recurrent cyclical cough since early 1990s associated with sinus disease, status post sinus surgery October, 2007 by Dr Lucia Gaskins      NP eval: 04/10/2011 Acute OV  Pt presents for an acute office visit. Complains of cough w/ yellow to green phlem, wheezing, chest tightness, nasal congestion increase SOB x 2 days. Pt states she thinks she has bronchitis.  Sinus congestion and drainage , left ear pain . OTC not helping.  rec Omnicef 300mg  Twice daily  For 10 days  Mucinex DM Twice daily  As needed  Cough/congestion  Saline nasal rinses As needed     Admit date:09/23/2017 Discharge date:09/28/2017  Time s   Recommendations for Outpatient Follow-up: F/u with PCP in 3-7 days as needed F/u with Pulmonology I n 2-3 weeks. needs PFTs  Discharge Diagnoses: Active Problems: OBESITY Acute bronchitis Asthma     10/25/2017 1st [facility] office visit in [place]  Re-establish      Chief Complaint  Patient presents with  . Pulmonary Consult    Referred by Dr. Bonita Quin. Pt c/o cough since Jan 2019. Cough is non prod. She can't lie down flat b/c of the cough. She is not sleeping well.  She also c/o SOB, worse when she coughs. She states "my chest feels like it has bricks on it".   pattern of recurrent "bronchitis" rx with cough med/abx per Dr Zadie Rhine with typically needs pred and nebulizer to get over and nasal sprays returned to [facility] [place]  Who rec surgery she declined and since then "sick up 4 x year" same pattern  and in Dec 2018 maint on singulair/ advair disc/ zyrtec and nasonex then the" worse ever cough" onset Jan 25th 2019  and no response to the usual rx so referred to pulmonary clinic 10/25/2017 by Dr   Radene Ou Cough is 24/7 some better p noct meds/ gags but no vomit / no  purulent sputum Gets up about 9 am and fist meal 10 30 am and takes ppi 30 min prior to first meal but the second dose is after supper Mostly sob with coughing fits rec   Change protonix to 40 mg Take 30- 60 min before your first and last meals of the day  Only use your albuterol nebulizer   Stop tenormin and replace with  Bisoprolol 5 mg daily  First take delsym two tsp every 12 hours and supplement if needed with  Tylenol #3 mg up to 2 every 4 hours to suppress the urge to cough at all or even clear your throat. .   Prednisone 10 mg take  4 each am x 2 days,   2 each am x 2 days,  1 each am x 2 days and stop (this is to eliminate allergies and inflammation from coughing) Protonix (pantoprazole) Take 30-60 min before first meal of the day and Pepcid 20 mg one bedtime plus chlorpheniramine 4 mg x 1-2 at bedtime (both available over the counter)  until cough is completely gone for at least a week without the need for cough suppression GERD diet  Please schedule a follow up office visit in 2 weeks, sooner if needed  with all medications /inhalers/ solutions in hand so we can verify exactly what you are taking. This includes all medications from  all doctors and over the counters separated into which ones are automatic vs which ones are as needed  - add needs allergy profile if not better      11/04/2017  f/u ov/Marietta Sikkema re: cough much better off advair/ getting over viral GI problems      Chief Complaint  Patient presents with  . Follow-up    her breathing has improved and she has not had to use her rescue inhaler or neb.    Dyspnea:  Gone/Not limited by breathing from desired activities   Cough: gone/ slt tickle when goes outside  Sleep: ok SABA use: none despite off advair rec Leave off advair for now and just use albuterol if needed for breathing No change reflux medications / diet  Please schedule a follow up office visit in 4 weeks, sooner if needed  - allergy testing/ feno on  return    02/09/2020  Acute  ov/Jeremy Mclamb re: singulair/nasonex cough never completely resolves since 1990s worse  esp with heat or laughing /also really cold weather with cough protonix bid ac and pepcid p meals scratchy throat January 17 2020 > [facility] pos RSV/ neg covid walk in rec alb neb smidge better Lavella Lemons /chlortab / hydromet helped the most  - mucinex did nothing  Chief Complaint  Patient presents with  . Acute Visit  Dyspnea:    amb fine no aerobbics  Cough: same as chronically/ mostly dry   Sleeping: on side/ back fine s resp cc's SABA use: last 4 days none 02: prn  Sinus surgery [facility] did not help     No obvious day to day or daytime variability or assoc excess/ purulent sputum or mucus plugs or hemoptysis or cp or chest tightness, subjective wheeze or overt sinus or hb symptoms.   Sleeping fine  without nocturnal  or early am exacerbation  of respiratory  c/o's or need for noct saba. Also denies any obvious fluctuation of symptoms with weather or environmental changes or other aggravating or alleviating factors except as outlined above   No unusual exposure hx or h/o childhood pna/ asthma or knowledge of premature birth.  Current Allergies, Complete Past Medical History, Past Surgical History, Family History, and Social History were reviewed in [facility] record.  ROS  The following are not active complaints unless bolded Hoarseness, sore throat, dysphagia, dental problems, itching, sneezing,  nasal congestion or discharge of excess mucus or purulent secretions, ear ache,   fever, chills, sweats, unintended wt loss or wt gain, classically pleuritic or exertional cp,  orthopnea pnd or arm/hand swelling  or leg swelling, presyncope, palpitations, abdominal pain, anorexia, nausea, vomiting, diarrhea  or change in bowel habits or change in bladder habits, change in stools or change in urine, dysuria, hematuria,  rash, arthralgias, visual complaints, headache,  numbness, weakness or ataxia or problems with walking or coordination,  change in mood or  memory.        Current Meds  Medication Sig  . albuterol (PROAIR HFA) 108 (90 Base) MCG/ACT inhaler Inhale 2 puffs into the lungs every 6 (six) hours as needed for wheezing or shortness of breath.  Marland Kitchen albuterol (PROVENTIL) (2.5 MG/3ML) 0.083% nebulizer solution Take 2.5 mg by nebulization every 6 (six) hours as needed for wheezing or shortness of breath.  Marland Kitchen aspirin EC 81 MG tablet Take 1 tablet (81 mg total) by mouth 2 (two) times daily.  . benzonatate (TESSALON) 100 MG capsule Take 100 mg by mouth 3 (three) times daily as  needed for cough.  . bisoprolol (ZEBETA) 5 MG tablet TAKE 1 TABLET(5 MG) BY MOUTH DAILY  . busPIRone (BUSPAR) 15 MG tablet Take 15 mg by mouth 2 (two) times daily.   . cetirizine (ZYRTEC) 10 MG tablet Take 10 mg by mouth at bedtime.  . Cholecalciferol (VITAMIN D3) 125 MCG (5000 UT) CAPS Take 10,000 Units by mouth See admin instructions. Take 10000 units daily Monday through Friday  . diclofenac sodium (VOLTAREN) 1 % GEL Apply 2 g topically 4 (four) times daily as needed (pain).   Marland Kitchen EMGALITY 120 MG/ML SOAJ INJECT (1ML) 120MG  UNDER SKIN EVERY 30 DAYS  . gabapentin (NEURONTIN) 300 MG capsule Take 300 mg by mouth at bedtime.  . Galcanezumab-gnlm (EMGALITY) 120 MG/ML SOSY Inject 120 mg into the skin every 30 (thirty) days.  . hydrochlorothiazide (MICROZIDE) 12.5 MG capsule Take 12.5 mg by mouth daily.  Marland Kitchen Ketorolac Tromethamine (SPRIX) 15.75 MG/SPRAY SOLN Place 1 spray into the nose every 6 (six) hours as needed. Max daily dose of 4 doses  . LATUDA 20 MG TABS tablet Take 20 mg by mouth every morning.  . mometasone (NASONEX) 50 MCG/ACT nasal spray Place 2 sprays into the nose daily.   . montelukast (SINGULAIR) 10 MG tablet Take 10 mg by mouth at bedtime.  . pantoprazole (PROTONIX) 40 MG tablet Take 40 mg by mouth 2 (two) times daily before a meal.   . rosuvastatin (CRESTOR) 10 MG tablet Take  20 mg by mouth daily.   . sertraline (ZOLOFT) 100 MG tablet Take 100-200 mg by mouth See admin instructions. Take 100 mg in the am and 200 mg in the pm  . SPRIX 15.75 MG/SPRAY SOLN Place 1 spray into both nostrils every 6 (six) hours as needed (migraines).   Marland Kitchen tiZANidine (ZANAFLEX) 4 MG tablet TAKE TWO TABLETS BY MOUTH EVERYDAY AT BEDTIME FOR SPASMS  . TROKENDI XR 100 MG CP24 TAKE ONE CAPSULE BY MOUTH EVERYDAY AT BEDTIME  . TROKENDI XR 50 MG CP24 TAKE ONE CAPSULE BY MOUTH EVERYDAY AT BEDTIME      amb bf nad     Wt Readings from Last 3 Encounters:  02/09/20 243 lb 12.8 oz (110.6 kg)  06/19/19 231 lb (104.8 kg)  12/15/18 238 lb (108 kg)     Vital signs reviewed - Note on arrival 02 sats  95% on RA      HEENT : pt wearing mask not removed for exam due to covid -19 concerns.    NECK :  without JVD/Nodes/TM/ nl carotid upstrokes bilaterally   LUNGS: no acc muscle use,  Nl contour chest which is clear to A and P bilaterally without cough and end inps/ early exp  maneuvers   CV:  RRR  no s3 or murmur or increase in P2, and no edema   ABD:  soft and nontender with nl inspiratory excursion in the supine position. No bruits or organomegaly appreciated, bowel sounds nl  MS:  Nl gait/ ext warm without deformities, calf tenderness, cyanosis or clubbing No obvious joint restrictions   SKIN: warm and dry without lesions    NEURO:  alert, approp, nl sensorium with  no motor or cerebellar deficits apparent.   Allergy profile 02/09/2020 >  Eos 0.3 /  IgE

## 2020-02-09 NOTE — Patient Instructions (Addendum)
I emphasized that nasal steroids  (nasonex) have no immediate benefit in terms of improving symptoms.  To help them reached the target tissue, the patient should use Afrin two puffs every 12 hours applied one min before using the nasal steroids.  Afrin should be stopped after no more than 5 days.  If the symptoms worsen, Afrin can be restarted after 5 days off of therapy to prevent rebound congestion from overuse of Afrin.  I also emphasized that in no way are nasal steroids a concern in terms of "addiction".   Please remember to go to the lab department   for your tests - we will call you with the results when they are available and we'll go from there in terms of arranging follow up    .

## 2020-02-11 ENCOUNTER — Encounter: Payer: Self-pay | Admitting: Internal Medicine

## 2020-02-11 NOTE — Assessment & Plan Note (Signed)
Recurrent pattern since 1990s "never really gets completely better"  - sinus surgery by Lucia Gaskins 2007 no better  - FENO 10/25/2017  =   19 on advair 250 bid > try off > improved 7/82/9562  Cyclical cough regimen 07/30/863 >>> cough  Improved  - Allergy profile 02/09/2020 >  Eos 0.3/  IgE  Pending  Pattern strongly suggests Upper airway cough syndrome (previously labeled PNDS),  is so named because it's frequently impossible to sort out how much is  CR/sinusitis with freq throat clearing (which can be related to primary GERD)   vs  causing  secondary (" extra esophageal")  GERD from wide swings in gastric pressure that occur with throat clearing, often  promoting self use of mint and menthol lozenges that reduce the lower esophageal sphincter tone and exacerbate the problem further in a cyclical fashion.   These are the same pts (now being labeled as having "irritable larynx syndrome" by some cough centers) who not infrequently have a history of having failed to tolerate ace inhibitors,  dry powder inhalers or biphosphonates or report having atypical/extraesophageal reflux symptoms that don't respond to standard doses of PPI  and are easily confused as having aecopd or asthma flares by even experienced allergists/ pulmonologists (myself included).   Will start with more aggressive rx aimed at rhinitis with better application of nasal steroids and consider allergy eval next as ent intervention previously did not help.         Each maintenance medication was reviewed in detail including emphasizing most importantly the difference between maintenance and prns and under what circumstances the prns are to be triggered using an action plan format where appropriate.  Total time for H and P, chart review, counseling, teaching devicehow to use nasal steroids)  and generating customized AVS unique to this acute office visit / charting = 30 min

## 2020-02-12 LAB — RESPIRATORY ALLERGY PROFILE REGION II ~~LOC~~

## 2020-02-12 LAB — INTERPRETATION:

## 2020-02-13 NOTE — Progress Notes (Signed)
Spoke with pt and notified of results per Dr. Wert. Pt verbalized understanding and denied any questions. 

## 2020-03-01 ENCOUNTER — Other Ambulatory Visit: Payer: Self-pay | Admitting: Neurology

## 2020-03-13 ENCOUNTER — Other Ambulatory Visit: Payer: Self-pay | Admitting: Nurse Practitioner

## 2020-03-13 ENCOUNTER — Other Ambulatory Visit: Payer: Self-pay

## 2020-03-13 DIAGNOSIS — Z1231 Encounter for screening mammogram for malignant neoplasm of breast: Secondary | ICD-10-CM

## 2020-03-26 ENCOUNTER — Ambulatory Visit
Admission: RE | Admit: 2020-03-26 | Discharge: 2020-03-26 | Disposition: A | Discharge status: Home / Self Care | Payer: Medicare Other | Source: Ambulatory Visit | Attending: Nurse Practitioner | Admitting: Nurse Practitioner

## 2020-03-26 ENCOUNTER — Other Ambulatory Visit: Payer: Self-pay

## 2020-03-26 DIAGNOSIS — Z1231 Encounter for screening mammogram for malignant neoplasm of breast: Secondary | ICD-10-CM

## 2020-05-07 ENCOUNTER — Ambulatory Visit: Payer: Medicare Other | Attending: Internal Medicine

## 2020-05-07 DIAGNOSIS — Z23 Encounter for immunization: Secondary | ICD-10-CM

## 2020-05-07 NOTE — Progress Notes (Signed)
   Covid-19 Vaccination Clinic  Name:  XOCHILTH STANDISH    MRN: [id number] DOB: Sep 26, 1965  05/07/2020  Ms. Marcussen was observed post Covid-19 immunization for 15 minutes without incident. She was provided with Vaccine Information Sheet and instruction to access the V-Safe system.   Ms. Puerto was instructed to call 911 with any severe reactions post vaccine: Marland Kitchen Difficulty breathing  . Swelling of face and throat  . A fast heartbeat  . A bad rash all over body  . Dizziness and weakness

## 2020-06-17 ENCOUNTER — Other Ambulatory Visit: Payer: Self-pay | Admitting: Neurology

## 2020-06-18 NOTE — Progress Notes (Addendum)
Due to the COVID-19 crisis, this virtual check-in visit was done via telephone from my office and it was initiated and consent given by this patient and or family.  Telephone (Audio) Visit The purpose of this telephone visit is to provide medical care while limiting exposure to the novel coronavirus.  As patient was unable to connect for a video visit, we had to switch to a telephone visit.   Consent was obtained for telephone visit and initiated by pt/family:  Yes.   Answered questions that patient had about telehealth interaction:  Yes.   I discussed the limitations, risks, security and privacy concerns of performing an evaluation and management service by telephone. I also discussed with the patient that there may be a patient responsible charge related to this service. The patient expressed understanding and agreed to proceed.  Pt location: Home Physician Location: office Name of referring provider:  Leighton Ruff, MD I connected with .Wanda [name] at patients initiation/request on 06/24/2020 at  3:30 PM EST by telephone and verified that I am speaking with the correct person using two identifiers.  Pt MRN:  [id number] Pt DOB:  [birth year]  Assessment/Plan:   1.  Migraine with aura 2.  Hemiplegic migraine  1.  Migraine prevention:  Emgality monthly, Trokendi XR 150mg  daily 2.  Migraine rescue:  Sprix NS 3.  Tizanidine 8mg  QHS PRN 4.  Limit use of pain relievers to no more than 2 days out of week to prevent risk of rebound or medication-overuse headache. 5.  Keep headache diary 6.  Follow up 6 months   Subjective:  Wanda [name] is a 54year old right-handed womanwith Paget's disease, osteoarthritis, fibromyalgia, GERD, IBS, hyperlipidemia, carotid artery disease and bilateral knee replacements who follows up for migraine and hemiplegic migraine.  UPDATE: Intensity:moderate to severe Duration:Usually 1-2 hours Frequency:once a week to once every other  week. Frequency of abortive medication:1 day a week Rescue:Sprix NS with Benadryl Current NSAIDS:ASA 81mg ;Sprix nasal spray Current analgesics:Hydrocodone (for pain related to Paget's) Current triptans:None Current ergotamine:None Current anti-emetic:Zofran 8 mg Current muscle relaxants:Tizanidine 8 mg at bedtime Current anti-anxiolytic:BuSpar Current sleep aide:Latuda Current Antihypertensive medications:Zepeta Current Antidepressant medications:Sertraline 100 mg Current Anticonvulsant medications:Trokendi XR 150 mg Current anti-CGRP:Emgality Current Vitamins/Herbal/Supplements:Ferrous sulfate Current Antihistamines/Decongestants:Benadryl Other therapy:Dry needling Hormone: None  Caffeine:No Alcohol:No Smoker:No Diet:Hydrates, no fried foods or dairy, no sweets or soda Exercise:Walks twice daily with her dog Depression:Right rotator cuff surgery 3 weeks ago.Yes; Anxiety:Yes (affected by pain) Other pain:Pain related to fibromyalgia Sleep hygiene:Fair  HISTORY: On 10/28/11, she was hospitalized for severe left-sided headache associated with slurred speech, left facial droop and left sided arm and leg weakness and numbness. Headache lasted a day and weakness lasted a few days but she reports mild residual weakness. MRI and MRA of head were personally reviewed and were negative for acute stroke. Carotid doppler showed 40-59% bilateral ICA stenosis with antegrade flow in the vertebral arteries. Echo showed EF 60% with no PFO or ASD. She was diagnosed with TIA and started on ASA. She has subsequently had several other similar episodes. CT head and MRI brain from 06/02/12, CT head and MRI of brain from 10/28/12, CT head and MRI brain from 06/02/15, and MRI brain from 10/03/15 were all personally reviewed and were unremarkable. MRI/MRA/MRV of head from 02/28/13 were unremarkable. Carotid doppler from 06/26/16 showed minor carotid atherosclerosis but no  hemodynamically significant stenosis.  She has left sidedthrobbingheadache/left retro-orbitalwithout the weakness as well. It is severe andassociated with nausea and photophobia. Sometimes  preceded by a visual aura of either spotty vision or a yellow strip in her visual field.They last 1 to 2 days. They occur 2 to 3 days per week. She has 25 headache days per month. Triggers: Dairy, cigarette smoke Relieving factors: rest Activity: Cannot function with severe headache.  She had a hemiplegic migraine on 10/11/17 with left sidedFacial weakness as well asnumbness and tingling of the left face, arm and leg. She went to the ED as a stroke code. MRI of brain was normal. CT perfusion was negative. CTA of head and neck showed mild right ICA origin soft and calcified plaque but no intracranial or extracranial large vessel stenosis or occlusion.  Past NSAIDS: Naproxen (GI upset), ibuprofen, Cambia (GI upset) Past analgesics: Excedrin, Tylenol, Percocet (hives), Norco, tramadol (hot flashes) Past abortive triptans: Relpax Past muscle relaxants: no Past anti-emetic: Phenergan 25mg , Thorazine (effective for migraine abortive therapy but exacerbated hemiplegic migraines) Past antihypertensive medications: verapamil 24hr 180mg , HCTZ, lisinopril, atenolol 50mg  Past antidepressant medications: nortriptyline 50mg  Past anticonvulsant medications: Depakote, zonisamide, topiramate 100mg  twice daily (effective but caused memory problems and paresthesias), gabapentin 300mg  three times daily Past anti-CGRP: None Past vitamins/Herbal/Supplements: no Other past therapy: Botox (2 rounds)  Family history of headache: mother    Objective:   Vitals:   06/24/20 1525  Weight: 239 lb (108.4 kg)  Height: 5\' 6"  (1.676 m)     Follow Up Instructions:      -I discussed the assessment and treatment plan with the patient. The patient was provided an opportunity to ask questions and all  were answered. The patient agreed with the plan and demonstrated an understanding of the instructions.   The patient was advised to call back or seek an in-person evaluation if the symptoms worsen or if the condition fails to improve as anticipated.    Total Time spent in visit with the patient was:  26 minutes   Dudley Major, DO

## 2020-06-24 ENCOUNTER — Other Ambulatory Visit: Payer: Self-pay

## 2020-06-24 ENCOUNTER — Telehealth (INDEPENDENT_AMBULATORY_CARE_PROVIDER_SITE_OTHER): Payer: Medicare Other | Admitting: Neurology

## 2020-06-24 ENCOUNTER — Encounter: Payer: Self-pay | Admitting: Neurology

## 2020-06-24 VITALS — Ht 66.0 in | Wt 239.0 lb

## 2020-06-24 DIAGNOSIS — G43409 Hemiplegic migraine, not intractable, without status migrainosus: Secondary | ICD-10-CM

## 2020-06-24 DIAGNOSIS — G43109 Migraine with aura, not intractable, without status migrainosus: Secondary | ICD-10-CM | POA: Diagnosis not present

## 2020-06-25 ENCOUNTER — Other Ambulatory Visit: Payer: Self-pay | Admitting: Neurology

## 2020-06-26 ENCOUNTER — Other Ambulatory Visit: Payer: Self-pay | Admitting: Neurology

## 2020-07-15 ENCOUNTER — Other Ambulatory Visit: Payer: Self-pay | Admitting: Neurology

## 2020-08-10 ENCOUNTER — Other Ambulatory Visit: Payer: Self-pay | Admitting: Orthopedic Surgery

## 2020-08-10 DIAGNOSIS — M25531 Pain in right wrist: Secondary | ICD-10-CM

## 2020-08-29 ENCOUNTER — Ambulatory Visit
Admission: RE | Admit: 2020-08-29 | Discharge: 2020-08-29 | Disposition: A | Discharge status: Home / Self Care | Payer: Medicare Other | Source: Ambulatory Visit | Attending: Orthopedic Surgery | Admitting: Orthopedic Surgery

## 2020-08-29 ENCOUNTER — Other Ambulatory Visit: Payer: Self-pay

## 2020-08-29 DIAGNOSIS — M25531 Pain in right wrist: Secondary | ICD-10-CM

## 2020-10-13 ENCOUNTER — Other Ambulatory Visit: Payer: Self-pay | Admitting: Neurology

## 2020-12-05 ENCOUNTER — Other Ambulatory Visit: Payer: Self-pay

## 2020-12-05 ENCOUNTER — Telehealth: Payer: Self-pay | Admitting: Neurology

## 2020-12-05 MED ORDER — EMGALITY 120 MG/ML ~~LOC~~ SOAJ
SUBCUTANEOUS | 0 refills | Status: DC
Start: 2020-12-05 — End: 2021-06-18

## 2020-12-05 NOTE — Telephone Encounter (Signed)
Emgality left medicine at home, needs a prescription sent to pharmacy for one refill for emergency over-ride.  She is down in [place] and left town quickly due to a new grandson being born.  [facility]  [address] [place] [address]  [phone], phone

## 2020-12-11 ENCOUNTER — Other Ambulatory Visit: Payer: Self-pay

## 2020-12-11 NOTE — Telephone Encounter (Signed)
Okay to close

## 2020-12-11 NOTE — Telephone Encounter (Signed)
Has this been completed?  Sending to clinical staff for review: Okay to sign/close encounter or is further follow up needed? 

## 2020-12-30 NOTE — Progress Notes (Signed)
NEUROLOGY FOLLOW UP OFFICE NOTE  Wanda VESEY [id number]  Assessment/Plan:   1.  Migraine with aura 2.  Hemiplegic migraine  1.  Migraine prevention:  Emgality Q4wks, Trokendi XR 150mg  daily 2.  Migraine rescue:  Sprix NS 3.  As sleep is improved with CPAP, will have her decrease tizanidine to 4mg  at bedtime.  If still doing well in a month, discontinue.  She will contact me if we need to restart. 4.  Limit use of pain relievers to no more than 2 days out of week to prevent risk of rebound or medication-overuse headache. 5.  Keep headache diary 6.  Follow up 6 months.  Subjective:  Wanda [name] is a 55year old right-handed womanwith Paget's disease, osteoarthritis, fibromyalgia, GERD, IBS, hyperlipidemia, carotid artery disease and bilateral knee replacements who follows up for migraine and hemiplegic migraine.  UPDATE: Yesterday, 2-3 hours cold rags Intensity:moderate-severe Duration:Usually 1-2 hours Frequency:2 a week (1 a month may be severe) Frequency of abortive medication:1 day a week  She is also concerned about her memory.  When she was on IR topiramate, it was significant.  It improved with Trokendi but it is a more subtle gradual memory loss.  She repeats things.  She may forget faces of people she knew but hasn't seen for a couple of years.  She may forget names. Rescue:Sprix NSwith Benadryl Current NSAIDS:ASA 81mg ;Sprix nasal spray Current analgesics:Hydrocodone (for pain related to Paget's) Current triptans:None Current ergotamine:None Current anti-emetic:Zofran 8 mg Current muscle relaxants:Tizanidine 8 mg at bedtime (to help with sleep) Current anti-anxiolytic:BuSpar Current sleep aide:Latuda Current Antihypertensive medications:Zepeta Current Antidepressant medications:Sertraline 300 mg Current Anticonvulsant medications:Trokendi XR 150 mg Current anti-CGRP:Emgality Current Vitamins/Herbal/Supplements:Ferrous  sulfate Current Antihistamines/Decongestants:Benadryl Other therapy:Dry needling Hormone: None   Caffeine:No Alcohol:No Smoker:No Diet:Hydrates, no fried foods or dairy, no sweets or soda Exercise:Walks twice daily with her dog Depression/Anxiety:   Other pain:Pain related to fibromyalgia Sleep hygiene:Started a CPAP for OSA - sleep much improved.  HISTORY: On 10/28/11, she was hospitalized for severe left-sided headache associated with slurred speech, left facial droop and left sided arm and leg weakness and numbness. Headache lasted a day and weakness lasted a few days but she reports mild residual weakness. MRI and MRA of head were personally reviewed and were negative for acute stroke. Carotid doppler showed 40-59% bilateral ICA stenosis with antegrade flow in the vertebral arteries. Echo showed EF 60% with no PFO or ASD. She was diagnosed with TIA and started on ASA. She has subsequently had several other similar episodes. CT head and MRI brain from 06/02/12, CT head and MRI of brain from 10/28/12, CT head and MRI brain from 06/02/15, and MRI brain from 10/03/15 were all personally reviewed and were unremarkable. MRI/MRA/MRV of head from 02/28/13 were unremarkable. Carotid doppler from 06/26/16 showed minor carotid atherosclerosis but no hemodynamically significant stenosis.  She has left sidedthrobbingheadache/left retro-orbitalwithout the weakness as well. It is severe andassociated with nausea and photophobia. Sometimes preceded by a visual aura of either spotty vision or a yellow strip in her visual field.They last 1 to 2 days. They occur 2 to 3 days per week. She has 25 headache days per month. Triggers: Dairy, cigarette smoke Relieving factors: rest Activity: Cannot function with severe headache.  She had a hemiplegic migraine on 10/11/17 with left sidedFacial weakness as well asnumbness and tingling of the left face, arm and leg. She went to the ED as a  stroke code. MRI of brain was normal. CT perfusion was negative.  CTA of head and neck showed mild right ICA origin soft and calcified plaque but no intracranial or extracranial large vessel stenosis or occlusion.  Past NSAIDS: Naproxen (GI upset), ibuprofen, Cambia (GI upset), Sprix NS (effective but no longer available) Past analgesics: Excedrin, Tylenol, Percocet (hives), Norco, tramadol (hot flashes) Past abortive triptans: Relpax Past muscle relaxants: no Past anti-emetic: Phenergan 25mg , Thorazine (effective for migraine abortive therapy but exacerbated hemiplegic migraines) Past antihypertensive medications: verapamil 24hr 180mg , HCTZ, lisinopril, atenolol 50mg  Past antidepressant medications: nortriptyline 50mg  Past anticonvulsant medications: Depakote, zonisamide, topiramate 100mg  twice daily (effective but caused memory problems and paresthesias), gabapentin 300mg  three times daily Past anti-CGRP: None Past vitamins/Herbal/Supplements: no Other past therapy: Botox (2 rounds)  Family history of headache: mother  PAST MEDICAL HISTORY: Past Medical History:  Diagnosis Date  . Acne   . Anemia    takes iron supplement  . Anxiety   . Asthma   . Chronic bronchitis ([place])   . Depression   . Family history of adverse reaction to anesthesia    pt's mother has hx. of post-op N/V  . Fibromyalgia   . GERD (gastroesophageal reflux disease)   . History of blood transfusion   . History of TIA (transient ischemic attack)    x 2; left-sided weakness  . Hypercholesterolemia    no current med.  . Hypertension    states under control with meds., has been on med. since 06/2011  . Keloid 07/270   umbilical  . Migraines   . Osteoarthritis   . Osteoarthritis   . Paget disease of bone   . PONV (postoperative nausea and vomiting)    states severe  . Seasonal allergies   . Stroke ([place]) 10/27/2012   TIA   1- 10/29/11-weakness left side    MEDICATIONS: Current Outpatient  Medications on File Prior to Visit  Medication Sig Dispense Refill  . albuterol (PROAIR HFA) 108 (90 Base) MCG/ACT inhaler Inhale 2 puffs into the lungs every 6 (six) hours as needed for wheezing or shortness of breath.    Marland Kitchen albuterol (PROVENTIL) (2.5 MG/3ML) 0.083% nebulizer solution Take 2.5 mg by nebulization every 6 (six) hours as needed for wheezing or shortness of breath.    Marland Kitchen aspirin EC 81 MG tablet Take 1 tablet (81 mg total) by mouth 2 (two) times daily. 60 tablet 0  . benzonatate (TESSALON) 100 MG capsule Take 100 mg by mouth 3 (three) times daily as needed for cough.    . bisoprolol (ZEBETA) 5 MG tablet TAKE 1 TABLET(5 MG) BY MOUTH DAILY 30 tablet 1  . busPIRone (BUSPAR) 15 MG tablet Take 15 mg by mouth 2 (two) times daily.     . cetirizine (ZYRTEC) 10 MG tablet Take 10 mg by mouth at bedtime.    . Cholecalciferol (VITAMIN D3) 125 MCG (5000 UT) CAPS Take 10,000 Units by mouth See admin instructions. Take 10000 units daily Monday through Friday    . gabapentin (NEURONTIN) 300 MG capsule Take 300 mg by mouth at bedtime.    . Galcanezumab-gnlm (EMGALITY) 120 MG/ML SOAJ INJECT (1ML) 120MG  UNDER SKIN EVERY 30 DAYS 1 mL 0  . Galcanezumab-gnlm (EMGALITY) 120 MG/ML SOSY Inject 120 mg into the skin every 30 (thirty) days. 1 Syringe 11  . hydrochlorothiazide (MICROZIDE) 12.5 MG capsule Take 12.5 mg by mouth daily.    Marland Kitchen Ketorolac Tromethamine (SPRIX) 15.75 MG/SPRAY SOLN Place 1 spray into the nose every 6 (six) hours as needed. Max daily dose of 4 doses 9 each 5  .  Ketorolac Tromethamine 15.75 MG/SPRAY SOLN Use 1 spray nasally every 6 hours as needed. max daily dose of 4 5 each 11  . LATUDA 20 MG TABS tablet Take 20 mg by mouth every morning.    . mometasone (NASONEX) 50 MCG/ACT nasal spray Place 2 sprays into the nose daily.     . montelukast (SINGULAIR) 10 MG tablet Take 10 mg by mouth at bedtime.    . pantoprazole (PROTONIX) 40 MG tablet Take 40 mg by mouth 2 (two) times daily before a meal.      . rosuvastatin (CRESTOR) 10 MG tablet Take 20 mg by mouth daily.     . sertraline (ZOLOFT) 100 MG tablet Take 100-200 mg by mouth See admin instructions. Take 100 mg in the am and 200 mg in the pm    . tiZANidine (ZANAFLEX) 4 MG tablet TAKE TWO TABLETS BY MOUTH EVERYDAY AT BEDTIME FOR SPASMS 60 tablet 3  . TROKENDI XR 100 MG CP24 TAKE ONE CAPSULE BY MOUTH EVERYDAY AT BEDTIME 90 capsule 5  . TROKENDI XR 50 MG CP24 TAKE ONE CAPSULE BY MOUTH EVERYDAY AT BEDTIME 90 capsule 5   No current facility-administered medications on file prior to visit.    ALLERGIES: Allergies  Allergen Reactions  . Lisinopril Swelling and Other (See Comments)    SWELLING FACE/LIPS/NECK  . Latex Rash    FAMILY HISTORY: Family History  Problem Relation Age of Onset  . Anesthesia problems Mother        post-op N/V  . Anxiety disorder Father   . Depression Father   . Cervical cancer Maternal Grandmother   . Cancer Maternal Grandmother        mouth  . Heart attack Maternal Grandmother 55  . Hypertension Brother   . Heart failure Brother        ICD      Objective:  Blood pressure 122/80, pulse 78, height 5\' 5"  (1.651 m), weight 228 lb (103.4 kg), last menstrual period 10/14/2002, SpO2 98 %. General: No acute distress.  Patient appears well-groomed.   Head:  Normocephalic/atraumatic Eyes:  Fundi examined but not visualized Neck: supple, no paraspinal tenderness, full range of motion Heart:  Regular rate and rhythm Lungs:  Clear to auscultation bilaterally Back: No paraspinal tenderness Neurological Exam: alert and oriented to person, place, and time.  Speech fluent and not dysarthric, language intact.  CN II-XII intact. Bulk and tone normal, muscle strength 5/5 throughout.  Sensation to light touch intact.  Deep tendon reflexes 2+ throughout.  Finger to nose testing intact.  Gait normal, Romberg negative.   Metta Clines, DO  CC:  Marda Stalker, PA-C

## 2020-12-31 ENCOUNTER — Other Ambulatory Visit: Payer: Self-pay

## 2020-12-31 ENCOUNTER — Ambulatory Visit: Payer: Medicare Other | Admitting: Neurology

## 2020-12-31 VITALS — BP 122/80 | HR 78 | Ht 65.0 in | Wt 228.0 lb

## 2020-12-31 DIAGNOSIS — G43109 Migraine with aura, not intractable, without status migrainosus: Secondary | ICD-10-CM | POA: Diagnosis not present

## 2020-12-31 DIAGNOSIS — G43409 Hemiplegic migraine, not intractable, without status migrainosus: Secondary | ICD-10-CM

## 2020-12-31 NOTE — Patient Instructions (Signed)
1.  Try taking 4mg  tizanidine at bedtime.  If still well in 4 weeks, then stop.  If we need to restart, let me know 2.  Otherwise, no change in medication 3.  If you would like neurocognitive testing, contact me and we will schedule you 4.  Follow up 6 months.

## 2021-01-01 ENCOUNTER — Encounter: Payer: Self-pay | Admitting: Neurology

## 2021-02-09 ENCOUNTER — Other Ambulatory Visit: Payer: Self-pay | Admitting: Neurology

## 2021-03-07 ENCOUNTER — Other Ambulatory Visit: Payer: Self-pay | Admitting: Neurology

## 2021-03-07 DIAGNOSIS — G43409 Hemiplegic migraine, not intractable, without status migrainosus: Secondary | ICD-10-CM

## 2021-03-10 ENCOUNTER — Other Ambulatory Visit: Payer: Self-pay

## 2021-03-10 ENCOUNTER — Telehealth: Payer: Self-pay | Admitting: Neurology

## 2021-03-10 NOTE — Progress Notes (Signed)
ERROR

## 2021-03-10 NOTE — Telephone Encounter (Signed)
Patient's pharmacy needs clarification of dosing directions for the patient's nasal spray Sprix.  [facility] [phone]

## 2021-03-11 ENCOUNTER — Other Ambulatory Visit: Payer: Self-pay

## 2021-03-11 DIAGNOSIS — G43409 Hemiplegic migraine, not intractable, without status migrainosus: Secondary | ICD-10-CM

## 2021-03-11 MED ORDER — KETOROLAC TROMETHAMINE 15.75 MG/SPRAY NA SOLN: NASAL | 5 refills | Status: DC

## 2021-03-11 NOTE — Telephone Encounter (Signed)
Per rep please disregard the call. They received the new script sent this morning.

## 2021-04-11 ENCOUNTER — Other Ambulatory Visit: Payer: Self-pay | Admitting: Neurosurgery

## 2021-04-11 DIAGNOSIS — M5440 Lumbago with sciatica, unspecified side: Secondary | ICD-10-CM

## 2021-04-29 ENCOUNTER — Other Ambulatory Visit: Payer: Self-pay | Admitting: Obstetrics and Gynecology

## 2021-04-29 DIAGNOSIS — Z1231 Encounter for screening mammogram for malignant neoplasm of breast: Secondary | ICD-10-CM

## 2021-04-30 ENCOUNTER — Other Ambulatory Visit: Payer: Self-pay

## 2021-04-30 ENCOUNTER — Ambulatory Visit
Admission: RE | Admit: 2021-04-30 | Discharge: 2021-04-30 | Disposition: A | Discharge status: Home / Self Care | Payer: Medicare Other | Source: Ambulatory Visit

## 2021-04-30 DIAGNOSIS — Z1231 Encounter for screening mammogram for malignant neoplasm of breast: Secondary | ICD-10-CM

## 2021-05-03 ENCOUNTER — Other Ambulatory Visit: Payer: Self-pay

## 2021-05-03 ENCOUNTER — Ambulatory Visit
Admission: RE | Admit: 2021-05-03 | Discharge: 2021-05-03 | Disposition: A | Discharge status: Home / Self Care | Payer: Medicare Other | Source: Ambulatory Visit | Attending: Neurosurgery | Admitting: Neurosurgery

## 2021-05-03 DIAGNOSIS — M5440 Lumbago with sciatica, unspecified side: Secondary | ICD-10-CM

## 2021-05-13 ENCOUNTER — Other Ambulatory Visit: Payer: Self-pay | Admitting: Neurosurgery

## 2021-06-17 ENCOUNTER — Other Ambulatory Visit: Payer: Self-pay | Admitting: Neurology

## 2021-06-17 NOTE — Pre-Procedure Instructions (Signed)
Surgical Instructions    Your procedure is scheduled on Monday, November 28th.  Report to [facility] Main Entrance "A" at 05:30 A.M., then check in with the Admitting office.  Call this number if you have problems the morning of surgery:  [phone]   If you have any questions prior to your surgery date call [phone]: Open Monday-Friday 8am-4pm    Remember:  Do not eat or drink after midnight the night before your surgery      Take these medicines the morning of surgery with A SIP OF WATER  bisoprolol (ZEBETA) busPIRone (BUSPAR) gabapentin (NEURONTIN)  mometasone (NASONEX) pantoprazole (PROTONIX) sertraline (ZOLOFT)    If needed albuterol (PROVENTIL) albuterol (VENTOLIN HFA)  Ketorolac Tromethamine (SPRIX)  ketotifen (ZADITOR) eye drops    As of today, STOP taking any Aspirin (unless otherwise instructed by your surgeon) Aleve, Naproxen, Ibuprofen, Motrin, Advil, Goody's, BC's, all herbal medications, fish oil, and all vitamins. This includes your meloxicam (MOBIC)   WHAT DO I DO ABOUT MY DIABETES MEDICATION?   Do not take metFORMIN (GLUCOPHAGE-XR) the morning of surgery.     HOW TO MANAGE YOUR DIABETES BEFORE AND AFTER SURGERY  Why is it important to control my blood sugar before and after surgery? Improving blood sugar levels before and after surgery helps healing and can limit problems. A way of improving blood sugar control is eating a healthy diet by:  Eating less sugar and carbohydrates  Increasing activity/exercise  Talking with your doctor about reaching your blood sugar goals High blood sugars (greater than 180 mg/dL) can raise your risk of infections and slow your recovery, so you will need to focus on controlling your diabetes during the weeks before surgery. Make sure that the doctor who takes care of your diabetes knows about your planned surgery including the date and location.  How do I manage my blood sugar before surgery? Check your  blood sugar at least 4 times a day, starting 2 days before surgery, to make sure that the level is not too high or low.  Check your blood sugar the morning of your surgery when you wake up and every 2 hours until you get to the Short Stay unit.  If your blood sugar is less than 70 mg/dL, you will need to treat for low blood sugar: Do not take insulin. Treat a low blood sugar (less than 70 mg/dL) with  cup of clear juice (cranberry or apple), 4 glucose tablets, OR glucose gel. Recheck blood sugar in 15 minutes after treatment (to make sure it is greater than 70 mg/dL). If your blood sugar is not greater than 70 mg/dL on recheck, call [phone] for further instructions. Report your blood sugar to the short stay nurse when you get to Short Stay.  If you are admitted to the hospital after surgery: Your blood sugar will be checked by the staff and you will probably be given insulin after surgery (instead of oral diabetes medicines) to make sure you have good blood sugar levels. The goal for blood sugar control after surgery is 80-180 mg/dL.                     Do NOT Smoke (Tobacco/Vaping) or drink Alcohol 24 hours prior to your procedure.  If you use a CPAP at night, you may bring all equipment for your overnight stay.   Contacts, glasses, piercing's, hearing aid's, dentures or partials may not be worn into surgery, please bring cases for these belongings.  For patients admitted to the hospital, discharge time will be determined by your treatment team.   Patients discharged the day of surgery will not be allowed to drive home, and someone needs to stay with them for 24 hours.  NO VISITORS WILL BE ALLOWED IN PRE-OP WHERE PATIENTS GET READY FOR SURGERY.  ONLY 1 SUPPORT PERSON MAY BE PRESENT IN THE WAITING ROOM WHILE YOU ARE IN SURGERY.  IF YOU ARE TO BE ADMITTED, ONCE YOU ARE IN YOUR ROOM YOU WILL BE ALLOWED TWO (2) VISITORS.  Minor children may have two parents present. Special consideration  for safety and communication needs will be reviewed on a case by case basis.   Special instructions:   [place]- Preparing For Surgery  Before surgery, you can play an important role. Because skin is not sterile, your skin needs to be as free of germs as possible. You can reduce the number of germs on your skin by washing with CHG (chlorahexidine gluconate) Soap before surgery.  CHG is an antiseptic cleaner which kills germs and bonds with the skin to continue killing germs even after washing.    Oral Hygiene is also important to reduce your risk of infection.  Remember - BRUSH YOUR TEETH THE MORNING OF SURGERY WITH YOUR REGULAR TOOTHPASTE  Please do not use if you have an allergy to CHG or antibacterial soaps. If your skin becomes reddened/irritated stop using the CHG.  Do not shave (including legs and underarms) for at least 48 hours prior to first CHG shower. It is OK to shave your face.  Please follow these instructions carefully.   Shower the NIGHT BEFORE SURGERY and the MORNING OF SURGERY  If you chose to wash your hair, wash your hair first as usual with your normal shampoo.  After you shampoo, rinse your hair and body thoroughly to remove the shampoo.  Use CHG Soap as you would any other liquid soap. You can apply CHG directly to the skin and wash gently with a scrungie or a clean washcloth.   Apply the CHG Soap to your body ONLY FROM THE NECK DOWN.  Do not use on open wounds or open sores. Avoid contact with your eyes, ears, mouth and genitals (private parts). Wash Face and genitals (private parts)  with your normal soap.   Wash thoroughly, paying special attention to the area where your surgery will be performed.  Thoroughly rinse your body with warm water from the neck down.  DO NOT shower/wash with your normal soap after using and rinsing off the CHG Soap.  Pat yourself dry with a CLEAN TOWEL.  Wear CLEAN PAJAMAS to bed the night before surgery  Place CLEAN SHEETS on  your bed the night before your surgery  DO NOT SLEEP WITH PETS.   Day of Surgery: Shower with CHG soap. Do not wear jewelry, make up, nail polish, gel polish, artificial nails, or any other type of covering on natural nails including finger and toenails. If patients have artificial nails, gel coating, etc. that need to be removed by a nail salon please have this removed prior to surgery. Surgery may need to be canceled/delayed if the surgeon/ anesthesia feels like the patient is unable to be adequately monitored. Do not wear lotions, powders, perfumes, or deodorant. Do not shave 48 hours prior to surgery.   Do not bring valuables to the hospital. [facility] is not responsible for any belongings or valuables. Wear Clean/Comfortable clothing the morning of surgery Remember to brush your teeth  WITH YOUR REGULAR TOOTHPASTE.   Please read over the following fact sheets that you were given.   3 days prior to your procedure or After your COVID test   You are not required to quarantine however you are required to wear a well-fitting mask when you are out and around people not in your household. If your mask becomes wet or soiled, replace with a new one.   Wash your hands often with soap and water for 20 seconds or clean your hands with an alcohol-based hand sanitizer that contains at least 60% alcohol.   Do not share personal items.   Notify your provider:  o if you are in close contact with someone who has COVID  o or if you develop a fever of 100.4 or greater, sneezing, cough, sore throat, shortness of breath or body aches.

## 2021-06-18 ENCOUNTER — Encounter (HOSPITAL_COMMUNITY)
Admission: RE | Admit: 2021-06-18 | Discharge: 2021-06-18 | Disposition: A | Discharge status: Home / Self Care | Payer: Medicare Other | Source: Ambulatory Visit | Attending: Neurosurgery | Admitting: Neurosurgery

## 2021-06-18 ENCOUNTER — Other Ambulatory Visit: Payer: Self-pay

## 2021-06-18 ENCOUNTER — Encounter (HOSPITAL_COMMUNITY): Payer: Self-pay

## 2021-06-18 VITALS — BP 130/60 | HR 74 | Temp 98.3°F | Resp 17 | Ht 66.0 in | Wt 259.3 lb

## 2021-06-18 DIAGNOSIS — Z01818 Encounter for other preprocedural examination: Secondary | ICD-10-CM | POA: Diagnosis not present

## 2021-06-18 DIAGNOSIS — I10 Essential (primary) hypertension: Secondary | ICD-10-CM | POA: Diagnosis not present

## 2021-06-18 DIAGNOSIS — E119 Type 2 diabetes mellitus without complications: Secondary | ICD-10-CM | POA: Insufficient documentation

## 2021-06-18 LAB — BASIC METABOLIC PANEL
Anion gap: 8 (ref 5–15)
BUN: 12 mg/dL (ref 6–20)
CO2: 27 mmol/L (ref 22–32)
Calcium: 9.6 mg/dL (ref 8.9–10.3)
Chloride: 107 mmol/L (ref 98–111)
Creatinine, Ser: 0.89 mg/dL (ref 0.44–1.00)
GFR, Estimated: >60 mL/min (ref >60)
Glucose, Bld: 130 mg/dL — ABNORMAL HIGH (ref 70–99)
Potassium: 3.7 mmol/L (ref 3.5–5.1)
Sodium: 142 mmol/L (ref 135–145)

## 2021-06-18 LAB — SURGICAL PCR SCREEN
MRSA, PCR: NEGATIVE
Staphylococcus aureus: NEGATIVE

## 2021-06-18 LAB — HEMOGLOBIN A1C
Hgb A1c MFr Bld: 6.4 % — ABNORMAL HIGH (ref 4.8–5.6)
Mean Plasma Glucose: 136.98 mg/dL

## 2021-06-18 LAB — CBC
HCT: 39 % (ref 36.0–46.0)
Hemoglobin: 12.4 g/dL (ref 12.0–15.0)
MCH: 28.6 pg (ref 26.0–34.0)
MCHC: 31.8 g/dL (ref 30.0–36.0)
MCV: 89.9 fL (ref 80.0–100.0)
Platelets: 201 10*3/uL (ref 150–400)
RBC: 4.34 MIL/uL (ref 3.87–5.11)
RDW: 13.6 % (ref 11.5–15.5)
WBC: 7.4 10*3/uL (ref 4.0–10.5)
nRBC: 0 % (ref 0.0–0.2)

## 2021-06-18 LAB — GLUCOSE, CAPILLARY: Glucose-Capillary: 133 mg/dL — ABNORMAL HIGH (ref 70–99)

## 2021-06-18 LAB — TYPE AND SCREEN
ABO/RH(D): O POS
Antibody Screen: NEGATIVE

## 2021-06-18 NOTE — Progress Notes (Signed)
PCP - Marda Stalker, PA-C Cardiologist - denies  PPM/ICD - denies   Chest x-ray - 09/23/17 EKG - 06/18/21 at PAT Stress Test - denies ECHO - 05/03/13 Cardiac Cath - denies  Sleep Study - 05/2020 pt diagnosed with OSA CPAP - yes  DM- Type 2 Fasting Blood Sugar - 130's Checks Blood Sugar once a day  Blood Thinner Instructions: n/a Aspirin Instructions: pt stopped ASA 2 weeks ago per orders  ERAS Protcol - NPO   COVID TEST- scheduled for testing 06/20/21   Anesthesia review: no  Patient denies shortness of breath, fever, cough and chest pain at PAT appointment   All instructions explained to the patient, with a verbal understanding of the material. Patient agrees to go over the instructions while at home for a better understanding. Patient also instructed to wear a mask in public after being tested for COVID-19. The opportunity to ask questions was provided.

## 2021-06-20 ENCOUNTER — Other Ambulatory Visit (HOSPITAL_COMMUNITY)
Admission: RE | Admit: 2021-06-20 | Discharge: 2021-06-20 | Disposition: A | Discharge status: Home / Self Care | Payer: Medicare Other | Source: Ambulatory Visit | Attending: Neurosurgery | Admitting: Neurosurgery

## 2021-06-20 DIAGNOSIS — Z20822 Contact with and (suspected) exposure to covid-19: Secondary | ICD-10-CM | POA: Insufficient documentation

## 2021-06-20 DIAGNOSIS — Z01818 Encounter for other preprocedural examination: Secondary | ICD-10-CM

## 2021-06-20 DIAGNOSIS — Z01812 Encounter for preprocedural laboratory examination: Secondary | ICD-10-CM | POA: Insufficient documentation

## 2021-06-20 LAB — SARS CORONAVIRUS 2 (TAT 6-24 HRS): SARS Coronavirus 2: NEGATIVE

## 2021-06-22 NOTE — Anesthesia Preprocedure Evaluation (Addendum)
Anesthesia Evaluation  Patient identified by MRN, date of birth, ID band Patient awake    Reviewed: Allergy & Precautions, NPO status , Patient's Chart, lab work & pertinent test results, reviewed documented beta blocker date and time   History of Anesthesia Complications (+) PONV, Family history of anesthesia reaction and history of anesthetic complications  Airway Mallampati: II  TM Distance: >3 FB Neck ROM: Full    Dental  (+) Teeth Intact, Dental Advisory Given   Pulmonary sleep apnea and Continuous Positive Airway Pressure Ventilation , former smoker,    Pulmonary exam normal breath sounds clear to auscultation       Cardiovascular hypertension, Pt. on home beta blockers and Pt. on medications Normal cardiovascular exam Rhythm:Regular Rate:Normal     Neuro/Psych  Headaches, PSYCHIATRIC DISORDERS Anxiety Depression  Neuromuscular disease CVA, Residual Symptoms    GI/Hepatic Neg liver ROS, GERD  Medicated,  Endo/Other  diabetes, Type 2, Oral Hypoglycemic AgentsMorbid obesity  Renal/GU negative Renal ROS     Musculoskeletal  (+) Arthritis , Fibromyalgia -  Abdominal   Peds  Hematology negative hematology ROS (+)   Anesthesia Other Findings   Reproductive/Obstetrics                            Anesthesia Physical Anesthesia Plan  ASA: 3  Anesthesia Plan: General   Post-op Pain Management: Ketamine IV and Tylenol PO (pre-op)   Induction: Intravenous  PONV Risk Score and Plan: 4 or greater and Scopolamine patch - Pre-op, Midazolam, TIVA, Dexamethasone, Ondansetron and Diphenhydramine  Airway Management Planned: Oral ETT  Additional Equipment:   Intra-op Plan:   Post-operative Plan: Extubation in OR  Informed Consent: I have reviewed the patients History and Physical, chart, labs and discussed the procedure including the risks, benefits and alternatives for the proposed anesthesia  with the patient or authorized representative who has indicated his/her understanding and acceptance.     Dental advisory given  Plan Discussed with: CRNA  Anesthesia Plan Comments: (2nd PIV for TIVA)       Anesthesia Quick Evaluation

## 2021-06-23 ENCOUNTER — Encounter (HOSPITAL_COMMUNITY)
Admission: RE | Disposition: A | Discharge status: Home / Self Care | Payer: Self-pay | Source: Home / Self Care | Attending: Neurosurgery

## 2021-06-23 ENCOUNTER — Inpatient Hospital Stay (HOSPITAL_COMMUNITY): Payer: Medicare Other | Admitting: Certified Registered Nurse Anesthetist

## 2021-06-23 ENCOUNTER — Encounter (HOSPITAL_COMMUNITY): Payer: Self-pay | Admitting: Neurosurgery

## 2021-06-23 ENCOUNTER — Inpatient Hospital Stay (HOSPITAL_COMMUNITY): Payer: Medicare Other

## 2021-06-23 ENCOUNTER — Inpatient Hospital Stay (HOSPITAL_COMMUNITY)
Admission: RE | Admit: 2021-06-23 | Discharge: 2021-06-26 | DRG: 455 | Disposition: A | Discharge status: Home / Self Care | Payer: Medicare Other | Attending: Neurosurgery | Admitting: Neurosurgery

## 2021-06-23 ENCOUNTER — Inpatient Hospital Stay (HOSPITAL_COMMUNITY): Payer: Medicare Other | Admitting: Vascular Surgery

## 2021-06-23 ENCOUNTER — Other Ambulatory Visit: Payer: Self-pay

## 2021-06-23 DIAGNOSIS — E119 Type 2 diabetes mellitus without complications: Secondary | ICD-10-CM | POA: Diagnosis present

## 2021-06-23 DIAGNOSIS — J42 Unspecified chronic bronchitis: Secondary | ICD-10-CM | POA: Diagnosis present

## 2021-06-23 DIAGNOSIS — Z7982 Long term (current) use of aspirin: Secondary | ICD-10-CM | POA: Diagnosis not present

## 2021-06-23 DIAGNOSIS — J45909 Unspecified asthma, uncomplicated: Secondary | ICD-10-CM | POA: Diagnosis present

## 2021-06-23 DIAGNOSIS — Z91040 Latex allergy status: Secondary | ICD-10-CM

## 2021-06-23 DIAGNOSIS — M1990 Unspecified osteoarthritis, unspecified site: Secondary | ICD-10-CM | POA: Diagnosis present

## 2021-06-23 DIAGNOSIS — Z90711 Acquired absence of uterus with remaining cervical stump: Secondary | ICD-10-CM

## 2021-06-23 DIAGNOSIS — M797 Fibromyalgia: Secondary | ICD-10-CM | POA: Diagnosis present

## 2021-06-23 DIAGNOSIS — Z888 Allergy status to other drugs, medicaments and biological substances status: Secondary | ICD-10-CM | POA: Diagnosis not present

## 2021-06-23 DIAGNOSIS — Z7984 Long term (current) use of oral hypoglycemic drugs: Secondary | ICD-10-CM | POA: Diagnosis not present

## 2021-06-23 DIAGNOSIS — I10 Essential (primary) hypertension: Secondary | ICD-10-CM | POA: Diagnosis present

## 2021-06-23 DIAGNOSIS — G4730 Sleep apnea, unspecified: Secondary | ICD-10-CM | POA: Diagnosis present

## 2021-06-23 DIAGNOSIS — Z791 Long term (current) use of non-steroidal anti-inflammatories (NSAID): Secondary | ICD-10-CM | POA: Diagnosis not present

## 2021-06-23 DIAGNOSIS — Z8249 Family history of ischemic heart disease and other diseases of the circulatory system: Secondary | ICD-10-CM

## 2021-06-23 DIAGNOSIS — Z818 Family history of other mental and behavioral disorders: Secondary | ICD-10-CM | POA: Diagnosis not present

## 2021-06-23 DIAGNOSIS — Z981 Arthrodesis status: Secondary | ICD-10-CM | POA: Diagnosis not present

## 2021-06-23 DIAGNOSIS — I69354 Hemiplegia and hemiparesis following cerebral infarction affecting left non-dominant side: Secondary | ICD-10-CM

## 2021-06-23 DIAGNOSIS — Z87891 Personal history of nicotine dependence: Secondary | ICD-10-CM | POA: Diagnosis not present

## 2021-06-23 DIAGNOSIS — K219 Gastro-esophageal reflux disease without esophagitis: Secondary | ICD-10-CM | POA: Diagnosis present

## 2021-06-23 DIAGNOSIS — M889 Osteitis deformans of unspecified bone: Secondary | ICD-10-CM | POA: Diagnosis present

## 2021-06-23 DIAGNOSIS — Z419 Encounter for procedure for purposes other than remedying health state, unspecified: Secondary | ICD-10-CM

## 2021-06-23 DIAGNOSIS — Z20822 Contact with and (suspected) exposure to covid-19: Secondary | ICD-10-CM | POA: Diagnosis present

## 2021-06-23 DIAGNOSIS — Z79899 Other long term (current) drug therapy: Secondary | ICD-10-CM

## 2021-06-23 DIAGNOSIS — M48061 Spinal stenosis, lumbar region without neurogenic claudication: Principal | ICD-10-CM | POA: Diagnosis present

## 2021-06-23 DIAGNOSIS — M5126 Other intervertebral disc displacement, lumbar region: Secondary | ICD-10-CM | POA: Diagnosis present

## 2021-06-23 DIAGNOSIS — Z91048 Other nonmedicinal substance allergy status: Secondary | ICD-10-CM

## 2021-06-23 HISTORY — PX: BACK SURGERY: SHX140

## 2021-06-23 LAB — POCT I-STAT 7, (LYTES, BLD GAS, ICA,H+H)
Acid-base deficit: 2 mmol/L (ref 0.0–2.0)
Bicarbonate: 23.3 mmol/L (ref 20.0–28.0)
Calcium, Ion: 1.19 mmol/L (ref 1.15–1.40)
HCT: 26 % — ABNORMAL LOW (ref 36.0–46.0)
Hemoglobin: 8.8 g/dL — ABNORMAL LOW (ref 12.0–15.0)
O2 Saturation: 99 %
Patient temperature: 36.3
Potassium: 3.3 mmol/L — ABNORMAL LOW (ref 3.5–5.1)
Sodium: 141 mmol/L (ref 135–145)
TCO2: 25 mmol/L (ref 22–32)
pCO2 arterial: 41.2 mmHg (ref 32.0–48.0)
pH, Arterial: 7.358 (ref 7.350–7.450)
pO2, Arterial: 147 mmHg — ABNORMAL HIGH (ref 83.0–108.0)

## 2021-06-23 LAB — GLUCOSE, CAPILLARY
Glucose-Capillary: 139 mg/dL — ABNORMAL HIGH (ref 70–99)
Glucose-Capillary: 169 mg/dL — ABNORMAL HIGH (ref 70–99)
Glucose-Capillary: 168 mg/dL — ABNORMAL HIGH (ref 70–99)
Glucose-Capillary: 181 mg/dL — ABNORMAL HIGH (ref 70–99)
Glucose-Capillary: 211 mg/dL — ABNORMAL HIGH (ref 70–99)
Glucose-Capillary: 181 mg/dL — ABNORMAL HIGH (ref 70–99)

## 2021-06-23 SURGERY — POSTERIOR LUMBAR FUSION 1 WITH HARDWARE REMOVAL
Anesthesia: General | Site: Back

## 2021-06-23 MED ORDER — INSULIN ASPART 100 UNIT/ML IJ SOLN: 0.0000 [IU] | Freq: Every day | INTRAMUSCULAR | Status: DC

## 2021-06-23 MED ORDER — SODIUM CHLORIDE 0.9% FLUSH: 3.0000 mL | INTRAVENOUS | Status: DC | PRN

## 2021-06-23 MED ORDER — TIZANIDINE HCL 4 MG PO TABS: 4.0000 mg | ORAL_TABLET | Freq: Three times a day (TID) | ORAL | Status: DC | PRN

## 2021-06-23 MED ORDER — SERTRALINE HCL 50 MG PO TABS: 100.0000 mg | ORAL_TABLET | ORAL | Status: DC

## 2021-06-23 MED ORDER — LACTATED RINGERS IV SOLN: INTRAVENOUS | Status: DC

## 2021-06-23 MED ORDER — TOPIRAMATE ER 25 MG PO SPRINKLE CAP24
50.0000 mg | EXTENDED_RELEASE_CAPSULE | Freq: Every day | ORAL | Status: DC
  Filled 2021-06-23 (×3): qty 2

## 2021-06-23 MED ORDER — METFORMIN HCL ER 500 MG PO TB24
500.0000 mg | ORAL_TABLET | Freq: Every evening | ORAL | Status: DC
  Administered 2021-06-23 – 2021-06-25 (×3): 500 mg via ORAL
  Filled 2021-06-23 (×3): qty 1

## 2021-06-23 MED ORDER — PROPOFOL 10 MG/ML IV BOLUS
INTRAVENOUS | Status: AC
  Filled 2021-06-23: qty 20

## 2021-06-23 MED ORDER — ROCURONIUM BROMIDE 10 MG/ML (PF) SYRINGE
PREFILLED_SYRINGE | INTRAVENOUS | Status: AC
  Filled 2021-06-23: qty 10

## 2021-06-23 MED ORDER — FENTANYL CITRATE (PF) 100 MCG/2ML IJ SOLN
INTRAMUSCULAR | Status: AC
  Administered 2021-06-23: 12:00:00 50 ug via INTRAVENOUS
  Filled 2021-06-23: qty 2

## 2021-06-23 MED ORDER — DEXAMETHASONE SODIUM PHOSPHATE 10 MG/ML IJ SOLN
INTRAMUSCULAR | Status: AC
  Filled 2021-06-23: qty 1

## 2021-06-23 MED ORDER — SCOPOLAMINE 1 MG/3DAYS TD PT72
1.0000 | MEDICATED_PATCH | TRANSDERMAL | Status: DC
  Administered 2021-06-23: 07:00:00 1.5 mg via TRANSDERMAL
  Filled 2021-06-23 (×2): qty 1

## 2021-06-23 MED ORDER — PANTOPRAZOLE SODIUM 40 MG IV SOLR: 40.0000 mg | Freq: Every day | INTRAVENOUS | Status: DC

## 2021-06-23 MED ORDER — KETAMINE HCL 50 MG/5ML IJ SOSY
PREFILLED_SYRINGE | INTRAMUSCULAR | Status: AC
  Filled 2021-06-23: qty 5

## 2021-06-23 MED ORDER — ACETAMINOPHEN 650 MG RE SUPP: 650.0000 mg | RECTAL | Status: DC | PRN

## 2021-06-23 MED ORDER — ONDANSETRON HCL 4 MG/2ML IJ SOLN: 4.0000 mg | Freq: Four times a day (QID) | INTRAMUSCULAR | Status: DC | PRN

## 2021-06-23 MED ORDER — VITAMIN D3 125 MCG (5000 UT) PO CAPS: 10000.0000 [IU] | ORAL_CAPSULE | ORAL | Status: DC

## 2021-06-23 MED ORDER — SUGAMMADEX SODIUM 500 MG/5ML IV SOLN
INTRAVENOUS | Status: DC | PRN
  Administered 2021-06-23: 100 mg via INTRAVENOUS
  Administered 2021-06-23: 200 mg via INTRAVENOUS

## 2021-06-23 MED ORDER — OXYCODONE HCL 5 MG PO TABS
10.0000 mg | ORAL_TABLET | ORAL | Status: DC | PRN
  Administered 2021-06-23 – 2021-06-26 (×9): 10 mg via ORAL
  Filled 2021-06-23 (×9): qty 2

## 2021-06-23 MED ORDER — HYDROMORPHONE HCL 1 MG/ML IJ SOLN: 0.5000 mg | INTRAMUSCULAR | Status: DC | PRN

## 2021-06-23 MED ORDER — HYDROCHLOROTHIAZIDE 12.5 MG PO CAPS
12.5000 mg | ORAL_CAPSULE | Freq: Every morning | ORAL | Status: DC
  Administered 2021-06-26: 12.5 mg via ORAL
  Filled 2021-06-23 (×3): qty 1

## 2021-06-23 MED ORDER — ONDANSETRON HCL 4 MG/2ML IJ SOLN
INTRAMUSCULAR | Status: AC
  Filled 2021-06-23: qty 2

## 2021-06-23 MED ORDER — DEXAMETHASONE SODIUM PHOSPHATE 10 MG/ML IJ SOLN: 10.0000 mg | Freq: Once | INTRAMUSCULAR | Status: DC

## 2021-06-23 MED ORDER — THROMBIN 20000 UNITS EX SOLR
CUTANEOUS | Status: DC | PRN
  Administered 2021-06-23: 09:00:00 20 mL via TOPICAL

## 2021-06-23 MED ORDER — PHENYLEPHRINE 40 MCG/ML (10ML) SYRINGE FOR IV PUSH (FOR BLOOD PRESSURE SUPPORT)
PREFILLED_SYRINGE | INTRAVENOUS | Status: DC | PRN
  Administered 2021-06-23: 80 ug via INTRAVENOUS

## 2021-06-23 MED ORDER — ALBUMIN HUMAN 25 % IV SOLN: INTRAVENOUS | Status: DC | PRN

## 2021-06-23 MED ORDER — ASPIRIN EC 81 MG PO TBEC: 81.0000 mg | DELAYED_RELEASE_TABLET | Freq: Every day | ORAL | Status: DC

## 2021-06-23 MED ORDER — SODIUM CHLORIDE 0.9% FLUSH
3.0000 mL | Freq: Two times a day (BID) | INTRAVENOUS | Status: DC
  Administered 2021-06-23 – 2021-06-25 (×4): 3 mL via INTRAVENOUS

## 2021-06-23 MED ORDER — BISOPROLOL FUMARATE 5 MG PO TABS
5.0000 mg | ORAL_TABLET | Freq: Every day | ORAL | Status: DC
  Administered 2021-06-25 – 2021-06-26 (×2): 5 mg via ORAL
  Filled 2021-06-23 (×3): qty 1

## 2021-06-23 MED ORDER — PHENYLEPHRINE 40 MCG/ML (10ML) SYRINGE FOR IV PUSH (FOR BLOOD PRESSURE SUPPORT)
PREFILLED_SYRINGE | INTRAVENOUS | Status: AC
  Filled 2021-06-23: qty 10

## 2021-06-23 MED ORDER — ACETAMINOPHEN 325 MG PO TABS
650.0000 mg | ORAL_TABLET | ORAL | Status: DC | PRN
  Administered 2021-06-23 – 2021-06-25 (×4): 650 mg via ORAL
  Filled 2021-06-23 (×4): qty 2

## 2021-06-23 MED ORDER — THROMBIN 20000 UNITS EX SOLR
CUTANEOUS | Status: AC
  Filled 2021-06-23: qty 20000

## 2021-06-23 MED ORDER — DEXAMETHASONE SODIUM PHOSPHATE 10 MG/ML IJ SOLN
INTRAMUSCULAR | Status: DC | PRN
  Administered 2021-06-23: 10 mg via INTRAVENOUS

## 2021-06-23 MED ORDER — LURASIDONE HCL 20 MG PO TABS
20.0000 mg | ORAL_TABLET | Freq: Every day | ORAL | Status: DC
  Administered 2021-06-23 – 2021-06-25 (×3): 20 mg via ORAL
  Filled 2021-06-23 (×4): qty 1

## 2021-06-23 MED ORDER — MIDAZOLAM HCL 2 MG/2ML IJ SOLN
INTRAMUSCULAR | Status: AC
  Filled 2021-06-23: qty 2

## 2021-06-23 MED ORDER — ALBUTEROL SULFATE HFA 108 (90 BASE) MCG/ACT IN AERS: 2.0000 | INHALATION_SPRAY | Freq: Four times a day (QID) | RESPIRATORY_TRACT | Status: DC | PRN

## 2021-06-23 MED ORDER — BUSPIRONE HCL 15 MG PO TABS
15.0000 mg | ORAL_TABLET | Freq: Two times a day (BID) | ORAL | Status: DC
  Administered 2021-06-23 – 2021-06-26 (×6): 15 mg via ORAL
  Filled 2021-06-23 (×7): qty 1

## 2021-06-23 MED ORDER — KETAMINE HCL 10 MG/ML IJ SOLN
INTRAMUSCULAR | Status: DC | PRN
  Administered 2021-06-23: 10 mg via INTRAVENOUS
  Administered 2021-06-23: 20 mg via INTRAVENOUS

## 2021-06-23 MED ORDER — LACTATED RINGERS IV SOLN: INTRAVENOUS | Status: DC | PRN

## 2021-06-23 MED ORDER — PROPOFOL 500 MG/50ML IV EMUL
INTRAVENOUS | Status: DC | PRN
  Administered 2021-06-23: 150 ug/kg/min via INTRAVENOUS
  Administered 2021-06-23: 100 ug/kg/min via INTRAVENOUS
  Administered 2021-06-23: 135 ug/kg/min via INTRAVENOUS

## 2021-06-23 MED ORDER — BUPIVACAINE LIPOSOME 1.3 % IJ SUSP
INTRAMUSCULAR | Status: AC
  Filled 2021-06-23: qty 20

## 2021-06-23 MED ORDER — ALUM & MAG HYDROXIDE-SIMETH 200-200-20 MG/5ML PO SUSP: 30.0000 mL | Freq: Four times a day (QID) | ORAL | Status: DC | PRN

## 2021-06-23 MED ORDER — SODIUM CHLORIDE 0.9 % IV SOLN: 250.0000 mL | INTRAVENOUS | Status: DC

## 2021-06-23 MED ORDER — ROSUVASTATIN CALCIUM 20 MG PO TABS
20.0000 mg | ORAL_TABLET | Freq: Every day | ORAL | Status: DC
  Administered 2021-06-23 – 2021-06-25 (×3): 20 mg via ORAL
  Filled 2021-06-23 (×3): qty 1

## 2021-06-23 MED ORDER — LORATADINE 10 MG PO TABS
5.0000 mg | ORAL_TABLET | Freq: Every day | ORAL | Status: DC
  Administered 2021-06-23 – 2021-06-25 (×3): 5 mg via ORAL
  Filled 2021-06-23 (×3): qty 1

## 2021-06-23 MED ORDER — PROPOFOL 10 MG/ML IV BOLUS
INTRAVENOUS | Status: DC | PRN
  Administered 2021-06-23: 120 mg via INTRAVENOUS
  Administered 2021-06-23 (×3): 20 mg via INTRAVENOUS

## 2021-06-23 MED ORDER — SERTRALINE HCL 50 MG PO TABS
100.0000 mg | ORAL_TABLET | Freq: Every day | ORAL | Status: DC
  Administered 2021-06-24 – 2021-06-26 (×2): 100 mg via ORAL
  Filled 2021-06-23 (×2): qty 2

## 2021-06-23 MED ORDER — PANTOPRAZOLE SODIUM 40 MG PO TBEC
40.0000 mg | DELAYED_RELEASE_TABLET | Freq: Two times a day (BID) | ORAL | Status: DC
  Administered 2021-06-23 – 2021-06-26 (×6): 40 mg via ORAL
  Filled 2021-06-23: qty 2
  Filled 2021-06-23 (×5): qty 1

## 2021-06-23 MED ORDER — FENTANYL CITRATE (PF) 250 MCG/5ML IJ SOLN
INTRAMUSCULAR | Status: AC
  Filled 2021-06-23: qty 5

## 2021-06-23 MED ORDER — INSULIN ASPART 100 UNIT/ML IJ SOLN
0.0000 [IU] | Freq: Three times a day (TID) | INTRAMUSCULAR | Status: DC
  Administered 2021-06-24: 3 [IU] via SUBCUTANEOUS
  Administered 2021-06-24 – 2021-06-26 (×3): 2 [IU] via SUBCUTANEOUS

## 2021-06-23 MED ORDER — ALBUTEROL SULFATE (2.5 MG/3ML) 0.083% IN NEBU: 2.5000 mg | INHALATION_SOLUTION | Freq: Four times a day (QID) | RESPIRATORY_TRACT | Status: DC | PRN

## 2021-06-23 MED ORDER — CHLORHEXIDINE GLUCONATE CLOTH 2 % EX PADS: 6.0000 | MEDICATED_PAD | Freq: Once | CUTANEOUS | Status: DC

## 2021-06-23 MED ORDER — 0.9 % SODIUM CHLORIDE (POUR BTL) OPTIME
TOPICAL | Status: DC | PRN
  Administered 2021-06-23 (×2): 1000 mL

## 2021-06-23 MED ORDER — MONTELUKAST SODIUM 10 MG PO TABS
10.0000 mg | ORAL_TABLET | Freq: Every day | ORAL | Status: DC
  Administered 2021-06-23 – 2021-06-25 (×3): 10 mg via ORAL
  Filled 2021-06-23 (×3): qty 1

## 2021-06-23 MED ORDER — CYCLOBENZAPRINE HCL 10 MG PO TABS
10.0000 mg | ORAL_TABLET | Freq: Three times a day (TID) | ORAL | Status: DC | PRN
  Administered 2021-06-23 – 2021-06-25 (×4): 10 mg via ORAL
  Filled 2021-06-23 (×4): qty 1

## 2021-06-23 MED ORDER — LIDOCAINE 2% (20 MG/ML) 5 ML SYRINGE
INTRAMUSCULAR | Status: DC | PRN
  Administered 2021-06-23: 80 mg via INTRAVENOUS

## 2021-06-23 MED ORDER — PROMETHAZINE HCL 25 MG/ML IJ SOLN: 6.2500 mg | INTRAMUSCULAR | Status: DC | PRN

## 2021-06-23 MED ORDER — ONDANSETRON HCL 4 MG/2ML IJ SOLN
INTRAMUSCULAR | Status: DC | PRN
  Administered 2021-06-23: 4 mg via INTRAVENOUS

## 2021-06-23 MED ORDER — LIDOCAINE 2% (20 MG/ML) 5 ML SYRINGE
INTRAMUSCULAR | Status: AC
  Filled 2021-06-23: qty 5

## 2021-06-23 MED ORDER — PHENOL 1.4 % MT LIQD: 1.0000 | OROMUCOSAL | Status: DC | PRN

## 2021-06-23 MED ORDER — CHLORHEXIDINE GLUCONATE 0.12 % MT SOLN
15.0000 mL | Freq: Once | OROMUCOSAL | Status: AC
  Administered 2021-06-23: 06:00:00 15 mL via OROMUCOSAL
  Filled 2021-06-23: qty 15

## 2021-06-23 MED ORDER — SCOPOLAMINE 1 MG/3DAYS TD PT72
MEDICATED_PATCH | TRANSDERMAL | Status: AC
  Filled 2021-06-23: qty 1

## 2021-06-23 MED ORDER — CEFAZOLIN SODIUM-DEXTROSE 2-4 GM/100ML-% IV SOLN
2.0000 g | Freq: Three times a day (TID) | INTRAVENOUS | Status: AC
  Administered 2021-06-23 – 2021-06-25 (×6): 2 g via INTRAVENOUS
  Filled 2021-06-23 (×6): qty 100

## 2021-06-23 MED ORDER — CEFAZOLIN SODIUM-DEXTROSE 2-4 GM/100ML-% IV SOLN
2.0000 g | INTRAVENOUS | Status: AC
  Administered 2021-06-23: 08:00:00 2 g via INTRAVENOUS
  Filled 2021-06-23: qty 100

## 2021-06-23 MED ORDER — SUGAMMADEX SODIUM 500 MG/5ML IV SOLN
INTRAVENOUS | Status: AC
  Filled 2021-06-23: qty 5

## 2021-06-23 MED ORDER — GABAPENTIN 300 MG PO CAPS
300.0000 mg | ORAL_CAPSULE | Freq: Three times a day (TID) | ORAL | Status: DC
  Administered 2021-06-23 – 2021-06-26 (×9): 300 mg via ORAL
  Filled 2021-06-23 (×9): qty 1

## 2021-06-23 MED ORDER — MIDAZOLAM HCL 5 MG/5ML IJ SOLN
INTRAMUSCULAR | Status: DC | PRN
  Administered 2021-06-23 (×2): 1 mg via INTRAVENOUS

## 2021-06-23 MED ORDER — ORAL CARE MOUTH RINSE: 15.0000 mL | Freq: Once | OROMUCOSAL | Status: AC

## 2021-06-23 MED ORDER — MENTHOL 3 MG MT LOZG: 1.0000 | LOZENGE | OROMUCOSAL | Status: DC | PRN

## 2021-06-23 MED ORDER — ONDANSETRON HCL 4 MG PO TABS: 4.0000 mg | ORAL_TABLET | Freq: Four times a day (QID) | ORAL | Status: DC | PRN

## 2021-06-23 MED ORDER — BUPIVACAINE LIPOSOME 1.3 % IJ SUSP
INTRAMUSCULAR | Status: DC | PRN
  Administered 2021-06-23: 20 mL

## 2021-06-23 MED ORDER — SERTRALINE HCL 50 MG PO TABS
200.0000 mg | ORAL_TABLET | Freq: Every day | ORAL | Status: DC
  Administered 2021-06-23 – 2021-06-25 (×3): 200 mg via ORAL
  Filled 2021-06-23 (×3): qty 4

## 2021-06-23 MED ORDER — FENTANYL CITRATE (PF) 250 MCG/5ML IJ SOLN
INTRAMUSCULAR | Status: DC | PRN
  Administered 2021-06-23: 25 ug via INTRAVENOUS
  Administered 2021-06-23 (×4): 50 ug via INTRAVENOUS
  Administered 2021-06-23: 25 ug via INTRAVENOUS

## 2021-06-23 MED ORDER — ROCURONIUM BROMIDE 10 MG/ML (PF) SYRINGE
PREFILLED_SYRINGE | INTRAVENOUS | Status: DC | PRN
  Administered 2021-06-23 (×3): 10 mg via INTRAVENOUS
  Administered 2021-06-23: 70 mg via INTRAVENOUS

## 2021-06-23 MED ORDER — FENTANYL CITRATE (PF) 100 MCG/2ML IJ SOLN
25.0000 ug | INTRAMUSCULAR | Status: DC | PRN
  Administered 2021-06-23: 13:00:00 25 ug via INTRAVENOUS

## 2021-06-23 MED ORDER — ACETAMINOPHEN 500 MG PO TABS
1000.0000 mg | ORAL_TABLET | Freq: Once | ORAL | Status: AC
  Administered 2021-06-23: 06:00:00 1000 mg via ORAL
  Filled 2021-06-23: qty 2

## 2021-06-23 MED ORDER — LIDOCAINE-EPINEPHRINE 1 %-1:100000 IJ SOLN
INTRAMUSCULAR | Status: DC | PRN
  Administered 2021-06-23: 10 mL

## 2021-06-23 MED ORDER — ALBUMIN HUMAN 5 % IV SOLN: INTRAVENOUS | Status: DC | PRN

## 2021-06-23 MED ORDER — FLUTICASONE PROPIONATE 50 MCG/ACT NA SUSP
2.0000 | Freq: Every day | NASAL | Status: DC
  Administered 2021-06-24 – 2021-06-26 (×3): 2 via NASAL
  Filled 2021-06-23: qty 16

## 2021-06-23 MED ORDER — BUPIVACAINE HCL (PF) 0.25 % IJ SOLN
INTRAMUSCULAR | Status: AC
  Filled 2021-06-23: qty 30

## 2021-06-23 MED ORDER — LIDOCAINE-EPINEPHRINE 1 %-1:100000 IJ SOLN
INTRAMUSCULAR | Status: AC
  Filled 2021-06-23: qty 1

## 2021-06-23 SURGICAL SUPPLY — 76 items
BAG COUNTER SPONGE SURGICOUNT (BAG) ×4 IMPLANT
BAG SURGICOUNT SPONGE COUNTING (BAG) ×2
BASKET BONE COLLECTION (BASKET) ×3 IMPLANT
BENZOIN TINCTURE PRP APPL 2/3 (GAUZE/BANDAGES/DRESSINGS) ×3 IMPLANT
BLADE CLIPPER SURG (BLADE) IMPLANT
BLADE SURG 11 STRL SS (BLADE) ×3 IMPLANT
BONE VIVIGEN FORMABLE 5.4CC (Bone Implant) ×3 IMPLANT
BUR CUTTER 7.0 ROUND (BURR) ×3 IMPLANT
BUR MATCHSTICK NEURO 3.0 LAGG (BURR) ×6 IMPLANT
CANISTER SUCT 3000ML PPV (MISCELLANEOUS) ×3 IMPLANT
CARTRIDGE OIL MAESTRO DRILL (MISCELLANEOUS) ×1 IMPLANT
CLAMP CONNECTOR PARALLEL (Clamp) ×6 IMPLANT
CLOSURE WOUND 1/2 X4 (GAUZE/BANDAGES/DRESSINGS) ×1
CNTNR URN SCR LID CUP LEK RST (MISCELLANEOUS) ×1 IMPLANT
CONT SPEC 4OZ STRL OR WHT (MISCELLANEOUS) ×3
COVER BACK TABLE 60X90IN (DRAPES) ×3 IMPLANT
DECANTER SPIKE VIAL GLASS SM (MISCELLANEOUS) ×3 IMPLANT
DERMABOND ADVANCED (GAUZE/BANDAGES/DRESSINGS) ×2
DERMABOND ADVANCED .7 DNX12 (GAUZE/BANDAGES/DRESSINGS) ×1 IMPLANT
DIFFUSER DRILL AIR PNEUMATIC (MISCELLANEOUS) ×3 IMPLANT
DRAPE C-ARM 42X72 X-RAY (DRAPES) ×6 IMPLANT
DRAPE C-ARMOR (DRAPES) IMPLANT
DRAPE HALF SHEET 40X57 (DRAPES) ×3 IMPLANT
DRAPE LAPAROTOMY 100X72X124 (DRAPES) ×3 IMPLANT
DRAPE SURG 17X23 STRL (DRAPES) ×3 IMPLANT
DRSG OPSITE 4X5.5 SM (GAUZE/BANDAGES/DRESSINGS) ×3 IMPLANT
DRSG OPSITE POSTOP 4X6 (GAUZE/BANDAGES/DRESSINGS) ×3 IMPLANT
DURAPREP 26ML APPLICATOR (WOUND CARE) ×3 IMPLANT
ELECT REM PT RETURN 9FT ADLT (ELECTROSURGICAL) ×3
ELECTRODE REM PT RTRN 9FT ADLT (ELECTROSURGICAL) ×1 IMPLANT
EVACUATOR 3/16  PVC DRAIN (DRAIN) ×3
EVACUATOR 3/16 PVC DRAIN (DRAIN) ×1 IMPLANT
GAUZE 4X4 16PLY ~~LOC~~+RFID DBL (SPONGE) ×6 IMPLANT
GAUZE SPONGE 4X4 12PLY STRL (GAUZE/BANDAGES/DRESSINGS) IMPLANT
GLOVE EXAM NITRILE XL STR (GLOVE) IMPLANT
GLOVE SURG ENC MOIS LTX SZ7 (GLOVE) IMPLANT
GLOVE SURG ENC MOIS LTX SZ8 (GLOVE) IMPLANT
GLOVE SURG POLYISO LF SZ7 (GLOVE) ×6 IMPLANT
GLOVE SURG POLYISO LF SZ7.5 (GLOVE) ×12 IMPLANT
GLOVE SURG POLYISO LF SZ8 (GLOVE) ×6 IMPLANT
GLOVE SURG UNDER LTX SZ8.5 (GLOVE) ×6 IMPLANT
GLOVE SURG UNDER POLY LF SZ7 (GLOVE) ×3 IMPLANT
GOWN STRL REUS W/ TWL LRG LVL3 (GOWN DISPOSABLE) ×2 IMPLANT
GOWN STRL REUS W/ TWL XL LVL3 (GOWN DISPOSABLE) ×3 IMPLANT
GOWN STRL REUS W/TWL 2XL LVL3 (GOWN DISPOSABLE) IMPLANT
GOWN STRL REUS W/TWL LRG LVL3 (GOWN DISPOSABLE) ×6
GOWN STRL REUS W/TWL XL LVL3 (GOWN DISPOSABLE) ×9
GRAFT BONE PROTEIOS LRG 5CC (Orthopedic Implant) ×3 IMPLANT
KIT BASIN OR (CUSTOM PROCEDURE TRAY) ×3 IMPLANT
KIT GRAFTMAG DEL NEURO DISP (NEUROSURGERY SUPPLIES) IMPLANT
KIT TURNOVER KIT B (KITS) ×3 IMPLANT
MILL MEDIUM DISP (BLADE) ×3 IMPLANT
NEEDLE HYPO 21X1.5 SAFETY (NEEDLE) ×3 IMPLANT
NEEDLE HYPO 25X1 1.5 SAFETY (NEEDLE) ×3 IMPLANT
NS IRRIG 1000ML POUR BTL (IV SOLUTION) ×6 IMPLANT
OIL CARTRIDGE MAESTRO DRILL (MISCELLANEOUS) ×3
PACK LAMINECTOMY NEURO (CUSTOM PROCEDURE TRAY) ×3 IMPLANT
PAD ARMBOARD 7.5X6 YLW CONV (MISCELLANEOUS) ×6 IMPLANT
ROD TI ALLOY CVD VIT 5.5X60MM (Rod) ×6 IMPLANT
SCREW VITALITY PA 6.5X45MM (Screw) ×6 IMPLANT
SPACER SUSTAIN RT 12 8D 9X26 (Spacer) ×6 IMPLANT
SPONGE SURGIFOAM ABS GEL 100 (HEMOSTASIS) ×3 IMPLANT
SPONGE T-LAP 4X18 ~~LOC~~+RFID (SPONGE) ×3 IMPLANT
STRIP CLOSURE SKIN 1/2X4 (GAUZE/BANDAGES/DRESSINGS) ×2 IMPLANT
SUT VIC AB 0 CT1 18XCR BRD8 (SUTURE) ×3 IMPLANT
SUT VIC AB 0 CT1 8-18 (SUTURE) ×9
SUT VIC AB 2-0 CT1 18 (SUTURE) ×3 IMPLANT
SUT VIC AB 4-0 PS2 27 (SUTURE) ×3 IMPLANT
SYR 20ML LL LF (SYRINGE) ×3 IMPLANT
TOP CLOSURE TORQ LIMIT (Neuro Prosthesis/Implant) ×6 IMPLANT
TOWEL GREEN STERILE (TOWEL DISPOSABLE) ×3 IMPLANT
TOWEL GREEN STERILE FF (TOWEL DISPOSABLE) ×3 IMPLANT
TRAY FOL W/BAG SLVR 16FR STRL (SET/KITS/TRAYS/PACK) ×1 IMPLANT
TRAY FOLEY MTR SLVR 16FR STAT (SET/KITS/TRAYS/PACK) IMPLANT
TRAY FOLEY W/BAG SLVR 16FR LF (SET/KITS/TRAYS/PACK) ×3
WATER STERILE IRR 1000ML POUR (IV SOLUTION) ×3 IMPLANT

## 2021-06-23 NOTE — H&P (Signed)
Wanda [name] is an 55 y.o. female.   Chief Complaint: Back and right greater than left leg pain HPI: 55 year old female longstanding back pain previously undergone L3-S1 fusion presents now with segmental degeneration above her fusion with worsening back pain and right greater than left hip and leg pain rating down L3 nerve root pattern.  Work-up revealed progressive spinal stenosis at L2-3 due to the patient's progressive clinical syndrome imaging findings failed conservative treatment I recommended decompression stabilization procedure with extension of fusion to include L2-3.  I extensively gone over the risks and benefits of the operation with her as well as perioperative course expectations of outcome and alternatives to surgery and she understood and agreed to proceed forward.  Past Medical History:  Diagnosis Date   Acne    Anemia    Anxiety    Asthma    Chronic bronchitis ([place])    Depression    Diabetes mellitus without complication ([place]) 2951   type 2   Family history of adverse reaction to anesthesia    pt's mother has hx. of post-op N/V   Fibromyalgia    GERD (gastroesophageal reflux disease)    History of blood transfusion    History of TIA (transient ischemic attack)    x 2; left-sided weakness   Hypercholesterolemia    no current med.   Hypertension    states under control with meds., has been on med. since 06/2011   Keloid 88/4166   umbilical   Migraines    Osteoarthritis    Osteoarthritis    Paget disease of bone    PONV (postoperative nausea and vomiting)    states severe   Seasonal allergies    Sleep apnea 2021   Stroke ([place]) 10/27/2012   TIA   1- 10/29/11-weakness left side    Past Surgical History:  Procedure Laterality Date   CARPAL TUNNEL RELEASE Left 12/25/2013   CARPAL TUNNEL RELEASE Right 03/29/2015   CHOLECYSTECTOMY  08/27/2009   DIAGNOSTIC LAPAROSCOPY  early 2005   ETHMOIDECTOMY Bilateral 04/30/2006   FUNCTIONAL ENDOSCOPIC SINUS SURGERY   04/30/2006   HYSTEROSCOPY WITH D & C  04/21/2002   with lysis of adhesions   KNEE ARTHROSCOPY Left 02/25/2000   LAMINOTOMY Left 03/27/2010   L5-S1; microdissection   LAMINOTOMY Bilateral 07/12/2007   L4-5   LUMBAR FUSION Left 07/18/2010   L4-5; L5-S1   LUMBAR FUSION  04/10/2014   L3-4, L4-5, L5-S1   NASAL TURBINATE REDUCTION  04/30/2006   inferior   ROTATOR CUFF REPAIR Right 2020   TONSILLECTOMY     removed as a child   TOTAL ABDOMINAL HYSTERECTOMY  11/01/2003   with lysis of adhesions; ovaries retained   TOTAL KNEE ARTHROPLASTY Left 12/31/2014   Procedure: TOTAL LEFT KNEE ARTHROPLASTY;  Surgeon: Frederik Pear, MD;  Location: [facility];  Service: Orthopedics;  Laterality: Left;   TOTAL KNEE ARTHROPLASTY Right 11/11/2015   Procedure: TOTAL KNEE ARTHROPLASTY;  Surgeon: Frederik Pear, MD;  Location: [facility];  Service: Orthopedics;  Laterality: Right;   TOTAL KNEE REVISION Right 06/27/2018   Procedure: TOTAL KNEE REVISION- TIBIAL;  Surgeon: Frederik Pear, MD;  Location: [facility];  Service: Orthopedics;  Laterality: Right;   VAGINAL HYSTERECTOMY     for DUB in 10/2003; partial    Family History  Problem Relation Age of Onset   Anesthesia problems Mother        post-op N/V   Anxiety disorder Father    Depression Father    Cervical cancer Maternal  Grandmother    Cancer Maternal Grandmother        mouth   Heart attack Maternal Grandmother 55   Hypertension Brother    Heart failure Brother        ICD   Social History:  reports that she quit smoking about 16 years ago. Her smoking use included cigarettes. She has a 7.50 pack-year smoking history. She has never used smokeless tobacco. She reports current alcohol use. She reports that she does not use drugs.  Allergies:  Allergies  Allergen Reactions   Lisinopril Swelling and Other (See Comments)    SWELLING FACE/LIPS/NECK   Latex Rash    Skin peeling   Tape Rash    Skin tears/burns.    Medications Prior to Admission  Medication Sig  Dispense Refill   aspirin EC 81 MG tablet Take 1 tablet (81 mg total) by mouth 2 (two) times daily. (Patient taking differently: Take 81 mg by mouth daily with breakfast.) 60 tablet 0   bisoprolol (ZEBETA) 5 MG tablet TAKE 1 TABLET(5 MG) BY MOUTH DAILY 30 tablet 1   busPIRone (BUSPAR) 15 MG tablet Take 15 mg by mouth 2 (two) times daily.      Cholecalciferol (VITAMIN D3) 125 MCG (5000 UT) CAPS Take 10,000 Units by mouth every Monday, Tuesday, Wednesday, Thursday, and Friday.     gabapentin (NEURONTIN) 300 MG capsule Take 300 mg by mouth in the morning, at noon, and at bedtime.     Galcanezumab-gnlm (EMGALITY) 120 MG/ML SOAJ INJECT 69ml UNDER THE SKIN every 30 DAYS 1 mL 0   hydrochlorothiazide (MICROZIDE) 12.5 MG capsule Take 12.5 mg by mouth in the morning.     Ketorolac Tromethamine (SPRIX) 15.75 MG/SPRAY SOLN Place 1 spray into the nose every 6 (six) hours as needed. Max daily dose of 4 doses 9 each 5   ketotifen (ZADITOR) 0.025 % ophthalmic solution 1 drop every 12 (twelve) hours as needed (watery eyes/itching eyes).     LATUDA 20 MG TABS tablet Take 20 mg by mouth at bedtime.     levocetirizine (XYZAL) 5 MG tablet Take 5 mg by mouth at bedtime.     metFORMIN (GLUCOPHAGE-XR) 500 MG 24 hr tablet Take 500 mg by mouth every evening.     mometasone (NASONEX) 50 MCG/ACT nasal spray Place 2 sprays into the nose in the morning and at bedtime.     montelukast (SINGULAIR) 10 MG tablet Take 10 mg by mouth at bedtime.     pantoprazole (PROTONIX) 40 MG tablet Take 40 mg by mouth 2 (two) times daily before a meal.      rosuvastatin (CRESTOR) 20 MG tablet Take 20 mg by mouth at bedtime.     sertraline (ZOLOFT) 100 MG tablet Take 100-200 mg by mouth See admin instructions. Take 1 tablet (100 mg) by mouth in the morning at breakfast & take 2 tablets (200 mg) by mouth at bedtime.     TROKENDI XR 100 MG CP24 TAKE ONE CAPSULE BY MOUTH EVERYDAY AT BEDTIME 90 capsule 5   TROKENDI XR 50 MG CP24 TAKE ONE CAPSULE BY  MOUTH EVERYDAY AT BEDTIME 90 capsule 5   albuterol (PROVENTIL) (2.5 MG/3ML) 0.083% nebulizer solution Take 2.5 mg by nebulization every 6 (six) hours as needed for wheezing or shortness of breath.     albuterol (VENTOLIN HFA) 108 (90 Base) MCG/ACT inhaler Inhale 2 puffs into the lungs every 6 (six) hours as needed for wheezing or shortness of breath.     Ketorolac Tromethamine  15.75 MG/SPRAY SOLN Use 1 spray nasally every 6 hours as needed. max daily dose of 4 (Patient not taking: Reported on 06/12/2021) 5 each 11   meloxicam (MOBIC) 15 MG tablet Take 15 mg by mouth daily.     tiZANidine (ZANAFLEX) 4 MG tablet TAKE TWO TABLETS BY MOUTH EVERYDAY AT BEDTIME 60 tablet 0    Results for orders placed or performed during the hospital encounter of 06/23/21 (from the past 48 hour(s))  Glucose, capillary     Status: Abnormal   Collection Time: 06/23/21  6:14 AM  Result Value Ref Range   Glucose-Capillary 139 (H) 70 - 99 mg/dL    Comment: Glucose reference range applies only to samples taken after fasting for at least 8 hours.   No results found.  Review of Systems  Musculoskeletal:  Positive for back pain.  Neurological:  Positive for numbness.   Blood pressure (!) 104/40, pulse 68, temperature 97.9 F (36.6 C), temperature source Oral, resp. rate 17, height 5\' 6"  (1.676 m), weight 117.6 kg, last menstrual period 10/14/2002, SpO2 97 %. Physical Exam HENT:     Head: Normocephalic.     Right Ear: Tympanic membrane normal.     Nose: Nose normal.     Mouth/Throat:     Mouth: Mucous membranes are moist.  Eyes:     Pupils: Pupils are equal, round, and reactive to light.  Cardiovascular:     Rate and Rhythm: Normal rate.  Pulmonary:     Effort: Pulmonary effort is normal.  Abdominal:     General: Abdomen is flat.  Musculoskeletal:        General: Normal range of motion.  Skin:    General: Skin is warm.  Neurological:     General: No focal deficit present.     Mental Status: She is alert.      Comments: Strength is 5-5 iliopsoas, quads, hamstrings, gastrocs, into tibialis, and EHL.     Assessment/Plan 55 year old female presents for L2-3 posterior lumbar interbody fusion  Elaina Hoops, MD 06/23/2021, 7:33 AM

## 2021-06-23 NOTE — Anesthesia Postprocedure Evaluation (Signed)
Anesthesia Post Note  Patient: RHINA KRAMME  Procedure(s) Performed: Posterior Lumbar Interbody Fusion  - Lumbar two -Lumbar three exploration fusion removal of sequoia hardware (Back)     Patient location during evaluation: PACU Anesthesia Type: General Level of consciousness: awake and alert Pain management: pain level controlled Vital Signs Assessment: post-procedure vital signs reviewed and stable Respiratory status: spontaneous breathing, nonlabored ventilation, respiratory function stable and patient connected to nasal cannula oxygen Cardiovascular status: blood pressure returned to baseline and stable Postop Assessment: no apparent nausea or vomiting Anesthetic complications: no   No notable events documented.  Last Vitals:  Vitals:   06/23/21 1612 06/23/21 1933  BP: (!) 112/48 (!) 125/53  Pulse: 79 86  Resp: 18 18  Temp: 36.7 C 37.4 C  SpO2: 100% 100%    Last Pain:  Vitals:   06/23/21 1933  TempSrc: Oral  PainSc:                  Catalina Gravel

## 2021-06-23 NOTE — Anesthesia Procedure Notes (Signed)
Procedure Name: Intubation Date/Time: 06/23/2021 8:06 AM Performed by: Janene Harvey, CRNA Pre-anesthesia Checklist: Patient identified, Emergency Drugs available, Suction available and Patient being monitored Patient Re-evaluated:Patient Re-evaluated prior to induction Oxygen Delivery Method: Circle system utilized Preoxygenation: Pre-oxygenation with 100% oxygen Induction Type: IV induction Ventilation: Mask ventilation without difficulty and Oral airway inserted - appropriate to patient size Laryngoscope Size: Mac and 4 Grade View: Grade III Tube type: Oral Tube size: 7.0 mm Number of attempts: 1 Airway Equipment and Method: Stylet and Oral airway Placement Confirmation: ETT inserted through vocal cords under direct vision, positive ETCO2 and breath sounds checked- equal and bilateral Secured at: 22 cm Tube secured with: Tape Dental Injury: Teeth and Oropharynx as per pre-operative assessment

## 2021-06-23 NOTE — Transfer of Care (Signed)
Immediate Anesthesia Transfer of Care Note  Patient: Wanda [name]  Procedure(s) Performed: Posterior Lumbar Interbody Fusion  - Lumbar two -Lumbar three exploration fusion removal of sequoia hardware (Back)  Patient Location: PACU  Anesthesia Type:General  Level of Consciousness: drowsy and patient cooperative  Airway & Oxygen Therapy: Patient Spontanous Breathing and Patient connected to face mask oxygen  Post-op Assessment: Report given to RN, Post -op Vital signs reviewed and stable and Patient moving all extremities X 4  Post vital signs: Reviewed and stable  Last Vitals:  Vitals Value Taken Time  BP 133/65 06/23/21 1147  Temp    Pulse 77 06/23/21 1151  Resp 16 06/23/21 1151  SpO2 97 % 06/23/21 1151  Vitals shown include unvalidated device data.  Last Pain:  Vitals:   06/23/21 0612  TempSrc: Oral  PainSc: 10-Worst pain ever         Complications: No notable events documented.

## 2021-06-23 NOTE — Op Note (Signed)
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  Posterior lumbar interbody fusion L2-3 utilizing the globus insert and rotate titanium cages packed with locally harvested autograft mixed with vivigen, protios  3.  Pedicle screw fixation placing new Zimmer vitality pedicle screws at L2 tying into an old [place] system utilizing the globus suggest addition set  4.  Posterior lateral arthrodesis L2-3 utilizing the locally harvested autograft mix  Surgeon: Dominica Severin Vihana Kydd  Assistant: Deatra Ina  Anesthesia: General  EBL: Minimal  HPI: 55 year old female progressive worsening back pain bilateral hip and leg pain rating down L3 nerve root pattern work-up revealed herniated disc at L2-3 and progressive stenosis with segmental degeneration.  Previously undergone L3-S1 fusion.  Patient was recommended decompression stabilization procedure I extensively went over the risks and benefits of the operation with her as well as perioperative course expectations of outcome and alternatives to surgery and she understood and agreed to proceed forward.  Operative procedure: Patient was brought into the OR was [place] general anesthesia positioned prone the Wilson frame her back was prepped and draped in routine sterile fashion.  Her old incision was opened up and extended slightly cephalad the scar tissue was dissected free and subperiosteal dissection was carried lamina of L1-L2 and then exposed the hardware from L3-L4.  Identified the TPs at L2 as well as the screws and exposed lateral to the L3-L4 screws there remove the spinous process drilled down the facet joints performed essentially  compression with complete medial facetectomies extensive minor scar tissue [place] complicated the redo foraminotomies at L3 but I aggressively under bit both supra articulating facets with deep and decompressing both L2 and L3 removal of the complete pars interarticularis gave access to the lateral margin disc base at L2-3.  Large herniated disc immediately visualized on the right at L2-3 this was all removed disc base was incised cleaned out sequentially bilaterally and utilizing sequential distraction I selected 9 x 12 mm cages 8 degree lordosis and inserted those under fluoroscopy with an extensive mount of autograft mix centrally.  Then tension taken pedicle screw placement at L2 bilaterally this was done under fluoroscopy all screws and implants were noted to be in good position with postop imaging.  Then I utilize a globus connector set and freed up the rod between L3 and L4 utilizing a lateral connector placed new rod connecting up the new L2 screws through the connector to the L3-4 construct.  I aggressively decorticated the TPs and lateral facet joints at L2-3 and packed an extensive mount of autograft mix posterior laterally from the TPs from L2-L3.  I reinspected the foramina to confirm patency no migration of graft material lay Gelfoam in the dura injected Exparel in the fascia and placed a medium Hemovac drain.  The wounds and closed with interrupted Vicryl and a running 4 subcuticular.  At the end the case all needle count sponge counts were correct.

## 2021-06-24 LAB — GLUCOSE, CAPILLARY
Glucose-Capillary: 170 mg/dL — ABNORMAL HIGH (ref 70–99)
Glucose-Capillary: 113 mg/dL — ABNORMAL HIGH (ref 70–99)
Glucose-Capillary: 144 mg/dL — ABNORMAL HIGH (ref 70–99)
Glucose-Capillary: 160 mg/dL — ABNORMAL HIGH (ref 70–99)

## 2021-06-24 NOTE — Evaluation (Signed)
Physical Therapy Evaluation Patient Details Name: Wanda [name] MRN: [id number] DOB: 1965-12-21 Today's Date: 06/24/2021  History of Present Illness  Pt is a 55yo female who presenting with worsening bak pain radiating down bilat LEs, R greater than L. Foudn to have spinal stenosis at L2-3. Pt underwent elective PLIF at L2-3 on 11/28. PSH: previous back surgeries, bilat TKA PMH: DM, depression, TIA, HTN, hypercholesterolemia   Clinical Impression  Patient is s/p above surgery resulting in the deficits listed below (see PT Problem List). Pt functioning at min guard level but activity tolerance limited by pain at 10/10 in back. Pt concerned about going home to early as she reports she fell last time. Dtr works during the day but mother available to sit with pt during the day. Patient will benefit from skilled PT to increase their independence and safety with mobility (while adhering to their precautions) to allow discharge to the venue listed below.        Recommendations for follow up therapy are one component of a multi-disciplinary discharge planning process, led by the attending physician.  Recommendations may be updated based on patient status, additional functional criteria and insurance authorization.  Follow Up Recommendations No PT follow up    Assistance Recommended at Discharge Intermittent Supervision/Assistance  Functional Status Assessment Patient has had a recent decline in their functional status and demonstrates the ability to make significant improvements in function in a reasonable and predictable amount of time.  Equipment Recommendations  BSC/3in1    Recommendations for Other Services       Precautions / Restrictions Precautions Precautions: Back Precaution Booklet Issued: Yes (comment) Precaution Comments: pt with good understanding, familar from previous back surgeries Required Braces or Orthoses: Spinal Brace Spinal Brace: Applied in sitting position;Lumbar  corset Restrictions Weight Bearing Restrictions: No Other Position/Activity Restrictions: lifting restrictions regarding back precautions      Mobility  Bed Mobility Overal bed mobility: Needs Assistance Bed Mobility: Sidelying to Sit;Sit to Sidelying   Sidelying to sit: Supervision     Sit to sidelying: Supervision General bed mobility comments: verbal cues for technique, bed rail taken down, pt able to complete without rail, increased time required, no physical assist needed for trunk or LEs    Transfers Overall transfer level: Needs assistance Equipment used: Rolling walker (2 wheels) Transfers: Sit to/from Stand Sit to Stand: Min guard           General transfer comment: increased time, verbal cues to push up from bed not pull up on walker, and to reach back for bed    Ambulation/Gait Ambulation/Gait assistance: Min guard Gait Distance (Feet): 100 Feet Assistive device: Rolling walker (2 wheels) Gait Pattern/deviations: Step-through pattern;Decreased stride length;Wide base of support Gait velocity: slow Gait velocity interpretation: <1.31 ft/sec, indicative of household ambulator   General Gait Details: pt with good upright posture but significantly slowed pace, required 2 standing rest breaks, pt c/o "my legs feel so weak', pt with wide base of support however suspect that ot be baseline due to body habitus  Stairs            Wheelchair Mobility    Modified Rankin (Stroke Patients Only)       Balance Overall balance assessment: Needs assistance Sitting-balance support: Feet supported;No upper extremity supported Sitting balance-Leahy Scale: Good     Standing balance support: Bilateral upper extremity supported;During functional activity Standing balance-Leahy Scale: Poor Standing balance comment: needs RW for safe amb  Pertinent Vitals/Pain Pain Assessment: 0-10 Pain Score: 10-Worst pain ever Pain  Location: back Pain Descriptors / Indicators: Sore Pain Intervention(s): Monitored during session    Home Living Family/patient expects to be discharged to:: Private residence Living Arrangements: Children Available Help at Discharge: Family;Available PRN/intermittently (dtr works during the day, mother can come spend the day but she uses a walker) Type of Home: Apartment Home Access: Level entry       Home Layout: One level Home Equipment: Conservation officer, nature (2 wheels);Shower seat;Cane - quad;BSC/3in1 (reports her 3n1 if rusting and she is afraid it'll break)      Prior Function Prior Level of Function : Needs assist             Mobility Comments: used a quad cane ADLs Comments: limited with LB dressing, reported her daughter was helping her or she would wear slip on shoes     Hand Dominance   Dominant Hand: Right    Extremity/Trunk Assessment   Upper Extremity Assessment Upper Extremity Assessment: Defer to OT evaluation    Lower Extremity Assessment Lower Extremity Assessment: Generalized weakness    Cervical / Trunk Assessment Cervical / Trunk Assessment: Back Surgery  Communication   Communication: No difficulties  Cognition Arousal/Alertness: Awake/alert Behavior During Therapy: WFL for tasks assessed/performed Overall Cognitive Status: Within Functional Limits for tasks assessed                                 General Comments: pt appears self limiting at times but is participatory, follows commands, and is cooperative        General Comments General comments (skin integrity, edema, etc.): pt with noted hemovac still intact    Exercises Other Exercises Other Exercises: discussed isometric abdominal muscle holds for count of 5-10 in sitting, supine and standing. "pull belly button towards spine"....educated no crunches   Assessment/Plan    PT Assessment Patient needs continued PT services  PT Problem List Decreased strength;Decreased  range of motion;Decreased activity tolerance;Decreased balance;Decreased mobility;Decreased coordination;Decreased knowledge of use of DME;Pain       PT Treatment Interventions Gait training;DME instruction;Stair training;Functional mobility training;Therapeutic activities;Therapeutic exercise    PT Goals (Current goals can be found in the Care Plan section)  Acute Rehab PT Goals Patient Stated Goal: get stronger PT Goal Formulation: With patient Time For Goal Achievement: 07/07/21 Potential to Achieve Goals: Good    Frequency Min 5X/week   Barriers to discharge Decreased caregiver support dtr works during the day, can have 24yo mother come stay with her during the day, but she uses a walker, would provide supervision level of assist    Co-evaluation               AM-PAC PT "6 Clicks" Mobility  Outcome Measure Help needed turning from your back to your side while in a flat bed without using bedrails?: None Help needed moving from lying on your back to sitting on the side of a flat bed without using bedrails?: None Help needed moving to and from a bed to a chair (including a wheelchair)?: A Little Help needed standing up from a chair using your arms (e.g., wheelchair or bedside chair)?: A Little Help needed to walk in hospital room?: A Little Help needed climbing 3-5 steps with a railing? : A Little 6 Click Score: 20    End of Session Equipment Utilized During Treatment: Back brace Activity Tolerance: Patient limited by pain Patient  left: in bed;with call bell/phone within reach (sitting EOB) Nurse Communication: Mobility status;Patient requests pain meds PT Visit Diagnosis: Unsteadiness on feet (R26.81);Muscle weakness (generalized) (M62.81);Difficulty in walking, not elsewhere classified (R26.2)    Time: 7253-6644 PT Time Calculation (min) (ACUTE ONLY): 23 min   Charges:   PT Evaluation $PT Eval Moderate Complexity: 1 Mod PT Treatments $Gait Training: 8-22 mins         Kittie Plater, PT, DPT Acute Rehabilitation Services Pager #: [phone] Office #: [phone]]   Berline Lopes 06/24/2021, 9:07 AM

## 2021-06-24 NOTE — TOC Progression Note (Addendum)
Transition of Care [facility]) - Progression Note    Patient Details  Name: Wanda [name] MRN: [id number] Date of Birth: [birth year]  Transition of Care [facility]) CM/SW Bas[place], RN Phone Number: 06/24/2021, 11:49 AM  Clinical Narrative:    HH has been pre arranged with [facility]. Latricia Heft will follow for [facility] needs.        Expected Discharge Plan and Services                                                 Social Determinants of Health (SDOH) Interventions    Readmission Risk Interventions No flowsheet data found.

## 2021-06-24 NOTE — Evaluation (Signed)
Occupational Therapy Evaluation Patient Details Name: Wanda [name] MRN: [id number] DOB: Aug 04, 1965 Today's Date: 06/24/2021   History of Present Illness Pt is a 55yo female who presenting with worsening bak pain radiating down bilat LEs, R greater than L. Foudn to have spinal stenosis at L2-3. Pt underwent elective PLIF at L2-3 on 11/28. PSH: previous back surgeries, bilat TKA PMH: DM, depression, TIA, HTN, hypercholesterolemia   Clinical Impression   Pt reported they live with daughter who can be in AM/PM to set up and then mother who uses a RW can assist transport items in the home during the day. Pt requires min assist with BLE ADLS but following education and use of AE was able to complete with min guard. Pt was able to complete sit to stand transfers with min guar and increase in time. Pt was able to don/doff brace in standing position with min guard and report 2/3 precautions . Pt currently with functional limitations due to the deficits listed below (see OT Problem List).  Pt will benefit from skilled OT to increase their safety and independence with ADL and functional mobility for ADL to facilitate discharge to venue listed below.        Recommendations for follow up therapy are one component of a multi-disciplinary discharge planning process, led by the attending physician.  Recommendations may be updated based on patient status, additional functional criteria and insurance authorization.   Follow Up Recommendations  Follow physician's recommendations for discharge plan and follow up therapies    Assistance Recommended at Discharge Intermittent Supervision/Assistance  Functional Status Assessment  Patient has had a recent decline in their functional status and demonstrates the ability to make significant improvements in function in a reasonable and predictable amount of time.  Equipment Recommendations  BSC/3in1 (Pt would benifit from a transfer bench but reported it may not  fit into bathroom space. Also, long handle sponge and sock aide)    Recommendations for Other Services       Precautions / Restrictions Precautions Precautions: Back Precaution Booklet Issued: Yes (comment) Precaution Comments: Pt able to report 2/3 precautions Required Braces or Orthoses: Spinal Brace Spinal Brace: Applied in standing position;Lumbar corset Restrictions Weight Bearing Restrictions: No Other Position/Activity Restrictions: lifting restrictions regarding back precautions      Mobility Bed Mobility Overal bed mobility: Needs Assistance Bed Mobility: Supine to Sit;Sit to Supine   Sidelying to sit: Supervision Supine to sit: Supervision Sit to supine: Supervision Sit to sidelying: Supervision General bed mobility comments: nees bed rail to complete    Transfers Overall transfer level: Needs assistance Equipment used: Rolling walker (2 wheels) Transfers: Sit to/from Stand Sit to Stand: Min guard           General transfer comment: as pt increase in tranfers required min guard      Balance Overall balance assessment: Needs assistance Sitting-balance support: Feet supported;No upper extremity supported Sitting balance-Leahy Scale: Good     Standing balance support: Bilateral upper extremity supported;During functional activity Standing balance-Leahy Scale: Fair Standing balance comment: needs RW for safe amb                           ADL either performed or assessed with clinical judgement   ADL Overall ADL's : Needs assistance/impaired Eating/Feeding: Independent;Sitting   Grooming: Wash/dry hands;Wash/dry face;Supervision/safety;Cueing for safety;Cueing for sequencing;Standing   Upper Body Bathing: Supervision/ safety;Cueing for sequencing;Cueing for safety;Sitting   Lower Body Bathing: Minimal assistance;Sit to/from stand  Upper Body Dressing : Supervision/safety;Cueing for safety;Cueing for sequencing;Sitting   Lower Body  Dressing: Minimal assistance;Sit to/from stand   Toilet Transfer: Min guard;Cueing for safety;Cueing for sequencing;Rolling walker (2 wheels)   Toileting- Water quality scientist and Hygiene: Min guard;Sit to/from stand       Functional mobility during ADLs: Supervision/safety;Rolling walker (2 wheels)       Vision Baseline Vision/History: 1 Wears glasses Ability to See in Adequate Light: 0 Adequate Patient Visual Report: No change from baseline       Perception     Praxis      Pertinent Vitals/Pain Pain Assessment: Faces Pain Score: 10-Worst pain ever Faces Pain Scale: Hurts even more Pain Location: back Pain Descriptors / Indicators: Sore Pain Intervention(s): Limited activity within patient's tolerance;Monitored during session;Premedicated before session     Hand Dominance Right   Extremity/Trunk Assessment Upper Extremity Assessment Upper Extremity Assessment: Overall WFL for tasks assessed   Lower Extremity Assessment Lower Extremity Assessment: Defer to PT evaluation   Cervical / Trunk Assessment Cervical / Trunk Assessment: Back Surgery   Communication Communication Communication: No difficulties   Cognition Arousal/Alertness: Awake/alert Behavior During Therapy: WFL for tasks assessed/performed Overall Cognitive Status: Within Functional Limits for tasks assessed                                 General Comments: pt appears self limiting at times but is participatory, follows commands, and is cooperative     General Comments  pt with noted hemovac still intact    Exercises Exercises: Other exercises Other Exercises Other Exercises: discussed isometric abdominal muscle holds for count of 5-10 in sitting, supine and standing. "pull belly button towards spine"....educated no crunches   Shoulder Instructions      Home Living Family/patient expects to be discharged to:: Private residence Living Arrangements: Children Available Help at  Discharge: Family;Available PRN/intermittently Type of Home: Apartment Home Access: Level entry     Home Layout: One level     Bathroom Shower/Tub: Teacher, early years/pre: Standard Bathroom Accessibility: Yes   Home Equipment: Conservation officer, nature (2 wheels);Shower seat;Cane - quad;BSC/3in1   Additional Comments: Pt reports they have a 3 in 1 but able to fall apart, daughter lives with them but works and can be there in [place] then mother can be there during the day but uses a RW      Prior Functioning/Environment Prior Level of Function : Needs assist             Mobility Comments: used a quad cane ADLs Comments: limited with LB dressing, reported her daughter was helping her or she would wear slip on shoes        OT Problem List: Decreased strength;Decreased activity tolerance;Impaired balance (sitting and/or standing);Decreased safety awareness;Decreased knowledge of use of DME or AE;Cardiopulmonary status limiting activity;Pain      OT Treatment/Interventions: Self-care/ADL training;Therapeutic exercise;DME and/or AE instruction;Therapeutic activities;Patient/family education;Balance training    OT Goals(Current goals can be found in the care plan section) Acute Rehab OT Goals Patient Stated Goal: to be able to feel better OT Goal Formulation: With patient Time For Goal Achievement: 07/05/21 Potential to Achieve Goals: Good ADL Goals Pt Will Perform Lower Body Bathing: with modified independence;sit to/from stand Pt Will Perform Lower Body Dressing: with modified independence;sit to/from stand Pt Will Transfer to Toilet: with modified independence;ambulating Pt Will Perform Tub/Shower Transfer: with modified independence;ambulating;tub bench;rolling walker  OT Frequency:  Min 2X/week   Barriers to D/C:            Co-evaluation              AM-PAC OT "6 Clicks" Daily Activity     Outcome Measure Help from another person eating meals?: None Help  from another person taking care of personal grooming?: None Help from another person toileting, which includes using toliet, bedpan, or urinal?: A Little Help from another person bathing (including washing, rinsing, drying)?: A Little Help from another person to put on and taking off regular upper body clothing?: A Little Help from another person to put on and taking off regular lower body clothing?: A Little 6 Click Score: 20   End of Session Equipment Utilized During Treatment: Gait belt;Rolling walker (2 wheels) Nurse Communication: Mobility status  Activity Tolerance: Patient tolerated treatment well Patient left: in bed;with call bell/phone within reach  OT Visit Diagnosis: Unsteadiness on feet (R26.81);Other abnormalities of gait and mobility (R26.89);Repeated falls (R29.6);Muscle weakness (generalized) (M62.81);Pain Pain - part of body:  (back)                Time: 0821-0900 OT Time Calculation (min): 39 min Charges:  OT General Charges $OT Visit: 1 Visit OT Evaluation $OT Eval Low Complexity: 1 Low OT Treatments $Self Care/Home Management : 23-37 mins  Joeseph Amor OTR/L  Acute Rehab Services  [phone] office number [phone] pager number   Joeseph Amor 06/24/2021, 9:28 AM

## 2021-06-24 NOTE — Progress Notes (Signed)
Pt stated she is able to place mask on her own. Will call if she needs any help.

## 2021-06-24 NOTE — Progress Notes (Signed)
Subjective: Patient reports some back pain but no leg pain, legs feel weak  Objective: Vital signs in last 24 hours: Temp:  [97.6 F (36.4 C)-99.4 F (37.4 C)] 99.3 F (37.4 C) (11/29 0743) Pulse Rate:  [69-87] 85 (11/29 0743) Resp:  [15-20] 18 (11/29 0743) BP: (101-133)/(44-65) 104/51 (11/29 0743) SpO2:  [97 %-100 %] 97 % (11/29 0743)  Intake/Output from previous day: 11/28 0701 - 11/29 0700 In: 3810 [P.O.:720; I.V.:1750; IV Piggyback:800] Out: 2275 [Urine:800; Drains:475; Blood:1000] Intake/Output this shift: No intake/output data recorded.  Neurologic: Grossly normal  Lab Results: Lab Results  Component Value Date   WBC 7.4 06/18/2021   HGB 8.8 (L) 06/23/2021   HCT 26.0 (L) 06/23/2021   MCV 89.9 06/18/2021   PLT 201 06/18/2021   Lab Results  Component Value Date   INR 1.02 06/22/2018   BMET Lab Results  Component Value Date   NA 141 06/23/2021   K 3.3 (L) 06/23/2021   CL 107 06/18/2021   CO2 27 06/18/2021   GLUCOSE 130 (H) 06/18/2021   BUN 12 06/18/2021   CREATININE 0.89 06/18/2021   CALCIUM 9.6 06/18/2021    Studies/Results: DG Lumbar Spine 2-3 Views  Result Date: 06/23/2021 CLINICAL DATA:  Posterior lumbar interbody fusion EXAM: LUMBAR SPINE - 2-3 VIEW COMPARISON:  None. FINDINGS: Intraoperative images demonstrate posterior rods and pedicle screws and interbody fusion devices. IMPRESSION: Intraoperative fluoroscopic guidance. Electronically Signed   By: Macy Mis M.D.   On: 06/23/2021 11:21   DG C-Arm 1-60 Min-No Report  Result Date: 06/23/2021 Fluoroscopy was utilized by the requesting physician.  No radiographic interpretation.    Assessment/Plan: Postop day 1 lumbar fusion. Continue efforts at mobilization and pain management. Likely dc home tomorrow   LOS: 1 day    Daveda Larock Aarit Kashuba 06/24/2021, 8:21 AM

## 2021-06-25 LAB — GLUCOSE, CAPILLARY
Glucose-Capillary: 141 mg/dL — ABNORMAL HIGH (ref 70–99)
Glucose-Capillary: 114 mg/dL — ABNORMAL HIGH (ref 70–99)
Glucose-Capillary: 116 mg/dL — ABNORMAL HIGH (ref 70–99)
Glucose-Capillary: 117 mg/dL — ABNORMAL HIGH (ref 70–99)

## 2021-06-25 MED ORDER — POLYETHYLENE GLYCOL 3350 17 G PO PACK
17.0000 g | PACK | Freq: Every day | ORAL | Status: DC
  Administered 2021-06-25: 17 g via ORAL
  Filled 2021-06-25: qty 1

## 2021-06-25 NOTE — Progress Notes (Signed)
Physical Therapy Treatment Patient Details Name: Wanda [name] MRN: [id number] DOB: 1966-01-25 Today's Date: 06/25/2021   History of Present Illness Pt is a 55yo female who presenting with worsening bak pain radiating down bilat LEs, R greater than L. Foudn to have spinal stenosis at L2-3. Pt underwent elective PLIF at L2-3 on 11/28. PSH: previous back surgeries, bilat TKA PMH: DM, depression, TIA, HTN, hypercholesterolemia    PT Comments    Pt making steady progress towards her physical therapy goals. Ambulating 160 feet with a walker at a supervision level. She verbalizes she has a fear of falling; reviewed fall prevention techniques and home set up. Will continue to follow acutely; don't anticipate need for PT follow up.     Recommendations for follow up therapy are one component of a multi-disciplinary discharge planning process, led by the attending physician.  Recommendations may be updated based on patient status, additional functional criteria and insurance authorization.  Follow Up Recommendations  No PT follow up     Assistance Recommended at Discharge Intermittent Supervision/Assistance  Equipment Recommendations  BSC/3in1    Recommendations for Other Services       Precautions / Restrictions Precautions Precautions: Back Precaution Booklet Issued: Yes (comment) Precaution Comments: Pt able to report 3/3 precautions Required Braces or Orthoses: Spinal Brace Spinal Brace: Applied in sitting position Restrictions Weight Bearing Restrictions: No Other Position/Activity Restrictions: lifting restrictions regarding back precautions     Mobility  Bed Mobility Overal bed mobility: Needs Assistance Bed Mobility: Sidelying to Sit   Sidelying to sit: Supervision       General bed mobility comments: OOB in chair    Transfers Overall transfer level: Needs assistance Equipment used: Rolling walker (2 wheels) Transfers: Sit to/from Stand Sit to Stand:  Supervision           General transfer comment: Cues for scooting forward to edge of seat, increased time to rise    Ambulation/Gait Ambulation/Gait assistance: Supervision Gait Distance (Feet): 160 Feet Assistive device: Rolling walker (2 wheels) Gait Pattern/deviations: Step-through pattern;Decreased stride length;Wide base of support Gait velocity: decreased     General Gait Details: Pt with good upright posture, slow and steady pace, supervision for safety   Stairs             Wheelchair Mobility    Modified Rankin (Stroke Patients Only)       Balance Overall balance assessment: Needs assistance Sitting-balance support: Feet supported;No upper extremity supported Sitting balance-Leahy Scale: Good     Standing balance support: Bilateral upper extremity supported;During functional activity Standing balance-Leahy Scale: Fair Standing balance comment: needs RW for safe amb                            Cognition Arousal/Alertness: Awake/alert Behavior During Therapy: WFL for tasks assessed/performed Overall Cognitive Status: Within Functional Limits for tasks assessed                                 General Comments: minimally droswy at beginning of session, secondary to pain medication making her feel "heavy" much improved cognition once ambulatory        Exercises General Exercises - Lower Extremity Long Arc Quad: Both;10 reps;Seated Hip Flexion/Marching: Both;5 reps;Seated    General Comments        Pertinent Vitals/Pain Pain Assessment: Faces Pain Score: 8  Faces Pain Scale: Hurts even more Pain Location:  headache, back Pain Descriptors / Indicators: Operative site guarding;Headache Pain Intervention(s): Limited activity within patient's tolerance;Monitored during session;Other (comment) (RN notified)    Home Living                          Prior Function            PT Goals (current goals can now be  found in the care plan section) Acute Rehab PT Goals Patient Stated Goal: get stronger Potential to Achieve Goals: Good Progress towards PT goals: Progressing toward goals    Frequency    Min 5X/week      PT Plan Current plan remains appropriate    Co-evaluation              AM-PAC PT "6 Clicks" Mobility   Outcome Measure  Help needed turning from your back to your side while in a flat bed without using bedrails?: None Help needed moving from lying on your back to sitting on the side of a flat bed without using bedrails?: None Help needed moving to and from a bed to a chair (including a wheelchair)?: A Little Help needed standing up from a chair using your arms (e.g., wheelchair or bedside chair)?: A Little Help needed to walk in hospital room?: A Little Help needed climbing 3-5 steps with a railing? : A Little 6 Click Score: 20    End of Session Equipment Utilized During Treatment: Back brace Activity Tolerance: Patient tolerated treatment well Patient left: in chair;with call bell/phone within reach Nurse Communication: Mobility status;Other (comment) (pt with HA) PT Visit Diagnosis: Unsteadiness on feet (R26.81);Muscle weakness (generalized) (M62.81);Difficulty in walking, not elsewhere classified (R26.2)     Time: 9379-0240 PT Time Calculation (min) (ACUTE ONLY): 20 min  Charges:  $Therapeutic Activity: 8-22 mins                     Wyona Almas, PT, DPT Acute Rehabilitation Services Pager [phone] Office [phone]    Deno Etienne 06/25/2021, 10:50 AM

## 2021-06-25 NOTE — Progress Notes (Signed)
Patient placed on CPAP for night rest with her home settings.  Tolerating well at this time.

## 2021-06-25 NOTE — Progress Notes (Signed)
Subjective: Patient reports  feeling okay still a lot of back pain no radicular pain  Objective: Vital signs in last 24 hours: Temp:  [98.2 F (36.8 C)-100.6 F (38.1 C)] 98.5 F (36.9 C) (11/30 1214) Pulse Rate:  [83-101] 83 (11/30 1214) Resp:  [18-20] 18 (11/30 1214) BP: (106-127)/(43-61) 106/50 (11/30 1214) SpO2:  [95 %-99 %] 96 % (11/30 1214)  Intake/Output from previous day: 11/29 0701 - 11/30 0700 In: 843 [P.O.:540; I.V.:3; IV Piggyback:300] Out: 255 [Drains:255] Intake/Output this shift: No intake/output data recorded.  Awake alert strength 5-5 incision clean dry and intact  Lab Results: Recent Labs    06/23/21 1055  HGB 8.8*  HCT 26.0*   BMET Recent Labs    06/23/21 1055  NA 141  K 3.3*    Studies/Results: No results found.  Assessment/Plan: Postop day 2 posterior lumbar fusion doing well neurologically still with a lot of back pain and not moving her bowels yet we will continue to work on bowel regimen physical therapy probable discharge in the morning  LOS: 2 days     Wanda [name] 06/25/2021, 1:36 PM

## 2021-06-25 NOTE — Progress Notes (Signed)
Occupational Therapy Treatment Patient Details Name: Wanda [name] MRN: [id number] DOB: [birth year] Today's Date: 06/25/2021   History of present illness Pt is a 55yo female who presenting with worsening bak pain radiating down bilat LEs, R greater than L. Foudn to have spinal stenosis at L2-3. Pt underwent elective PLIF at L2-3 on 11/28. PSH: previous back surgeries, bilat TKA PMH: DM, depression, TIA, HTN, hypercholesterolemia   OT comments  Patient continues to make steady progress towards goals in skilled OT session. Patient's session encompassed  rehearsal of back precautions, toileting, and functional ambulation to complete household distances. Per patient report, this is her fifth back surgery and is well versed in precautions, donning brace, and overall mobility. Patient able to complete sit<>stand transfers, and toileting without assist from therapist, using appropriate extra time in adherence with precautions. Of note, patient feeling "heavy" at beginning of session, which patient believes is from pain medication. Education provided to patient for safety when patient is feeling lethargic due to pain medication with verbal acknowledgement given. Recommendation updated to no further OT post hospital stay; therapy will continue to follow while in house.    Recommendations for follow up therapy are one component of a multi-disciplinary discharge planning process, led by the attending physician.  Recommendations may be updated based on patient status, additional functional criteria and insurance authorization.    Follow Up Recommendations  No OT follow up    Assistance Recommended at Discharge Intermittent Supervision/Assistance  Equipment Recommendations  BSC/3in1 (Pt would benifit from a transfer bench but reported it may not fit into bathroom space. Also, long handle sponge and sock aide)    Recommendations for Other Services      Precautions / Restrictions  Precautions Precautions: Back Precaution Booklet Issued: Yes (comment) Precaution Comments: Pt able to report 3/3 precautions Required Braces or Orthoses: Spinal Brace Spinal Brace: Applied in standing position;Lumbar corset Restrictions Weight Bearing Restrictions: No Other Position/Activity Restrictions: lifting restrictions regarding back precautions       Mobility Bed Mobility Overal bed mobility: Needs Assistance Bed Mobility: Sidelying to Sit   Sidelying to sit: Supervision       General bed mobility comments: nees bed rail to complete    Transfers Overall transfer level: Needs assistance Equipment used: Rolling walker (2 wheels) Transfers: Sit to/from Stand Sit to Stand: Min guard           General transfer comment: good demostration of transfer technique to complete sit<>stand from bed and toilet in session, no need for cueing     Balance Overall balance assessment: Needs assistance Sitting-balance support: Feet supported;No upper extremity supported Sitting balance-Leahy Scale: Good     Standing balance support: Bilateral upper extremity supported;During functional activity Standing balance-Leahy Scale: Fair Standing balance comment: needs RW for safe amb                           ADL either performed or assessed with clinical judgement   ADL Overall ADL's : Needs assistance/impaired Eating/Feeding: Independent;Sitting   Grooming: Wash/dry hands;Supervision/safety;Standing           Upper Body Dressing : Supervision/safety;Standing       Toilet Transfer: Min guard;Cueing for safety;Cueing for sequencing;Rolling walker (2 wheels);Regular Toilet   Toileting- Water quality scientist and Hygiene: Min guard;Sit to/from stand;Adhering to back precautions       Functional mobility during ADLs: Supervision/safety;Rolling walker (2 wheels) General ADL Comments: patient continues to demonstrate good adherence to back precautions, as  well as  complete ADLs without physical assistance    Extremity/Trunk Assessment              Vision       Perception     Praxis      Cognition Arousal/Alertness: Awake/alert Behavior During Therapy: WFL for tasks assessed/performed Overall Cognitive Status: Within Functional Limits for tasks assessed                                 General Comments: minimally droswy at beginning of session, secondary to pain medication making her feel "heavy" much improved cognition once ambulatory          Exercises     Shoulder Instructions       General Comments      Pertinent Vitals/ Pain       Pain Assessment: 0-10 Pain Score: 8  Pain Location: back Pain Descriptors / Indicators: Sore Pain Intervention(s): Limited activity within patient's tolerance;Monitored during session;Premedicated before session;Repositioned  Home Living                                          Prior Functioning/Environment              Frequency  Min 2X/week        Progress Toward Goals  OT Goals(current goals can now be found in the care plan section)  Progress towards OT goals: Progressing toward goals  Acute Rehab OT Goals Patient Stated Goal: to go home and have less pain OT Goal Formulation: With patient Time For Goal Achievement: 07/05/21 Potential to Achieve Goals: Good  Plan Discharge plan needs to be updated    Co-evaluation                 AM-PAC OT "6 Clicks" Daily Activity     Outcome Measure   Help from another person eating meals?: None Help from another person taking care of personal grooming?: None Help from another person toileting, which includes using toliet, bedpan, or urinal?: A Little Help from another person bathing (including washing, rinsing, drying)?: A Little Help from another person to put on and taking off regular upper body clothing?: A Little Help from another person to put on and taking off regular lower body  clothing?: A Little 6 Click Score: 20    End of Session Equipment Utilized During Treatment: Rolling walker (2 wheels);Back brace  OT Visit Diagnosis: Unsteadiness on feet (R26.81);Other abnormalities of gait and mobility (R26.89);Repeated falls (R29.6);Muscle weakness (generalized) (M62.81);Pain Pain - part of body:  (back)   Activity Tolerance Patient tolerated treatment well   Patient Left with call bell/phone within reach;in chair   Nurse Communication Mobility status        Time: 2426-8341 OT Time Calculation (min): 24 min  Charges: OT General Charges $OT Visit: 1 Visit OT Treatments $Self Care/Home Management : 23-37 mins  [place]. [place], [place] Acute Rehabilitation Services [place] 06/25/2021, 10:50 AM

## 2021-06-26 LAB — GLUCOSE, CAPILLARY: Glucose-Capillary: 136 mg/dL — ABNORMAL HIGH (ref 70–99)

## 2021-06-26 MED ORDER — OXYCODONE HCL 10 MG PO TABS: 10.0000 mg | ORAL_TABLET | ORAL | 0 refills | Status: DC | PRN

## 2021-06-26 NOTE — Discharge Summary (Signed)
Physician Discharge Summary  Patient ID: Wanda [name] MRN: [id number] DOB/AGE: [birth year] 56 y.o. Estimated body mass index is 41.85 kg/m as calculated from the following:   Height as of this encounter: 5\' 6"  (1.676 m).   Weight as of this encounter: 117.6 kg.   Admit date: 06/23/2021 Discharge date: 06/26/2021  Admission Diagnoses: Lumbar spinal stenosis (segmental instability  Discharge Diagnoses: Same Principal Problem:   HNP (herniated nucleus pulposus), lumbar   Discharged Condition: good  Hospital Course: Hospital underwent decompressive lumbar fusion, postoperatively with was ambulating and voiding spontaneously tolerating regular diet 3 patient is stable for discharge home.  Patient be discharged scheduled follow-up in 1 to 2 weeks   Consults: Significant Diagnostic Studies: Treatments: Posterior lumbar interbody fusion L2-3 Discharge Exam: Blood pressure (!) 133/54, pulse 100, temperature 99.6 F (37.6 C), temperature source Oral, resp. rate 20, height 5\' 6"  (1.676 m), weight 117.6 kg, last menstrual period 10/14/2002, SpO2 95 %. Strength 5 out of 5 wound clean dry and intact  Disposition: Home   Allergies as of 06/26/2021       Reactions   Lisinopril Swelling, Other (See Comments)   SWELLING FACE/LIPS/NECK   Latex Rash   Skin peeling   Tape Rash   Skin tears/burns.        Medication List     TAKE these medications    albuterol 108 (90 Base) MCG/ACT inhaler Commonly known as: VENTOLIN HFA Inhale 2 puffs into the lungs every 6 (six) hours as needed for wheezing or shortness of breath.   albuterol (2.5 MG/3ML) 0.083% nebulizer solution Commonly known as: PROVENTIL Take 2.5 mg by nebulization every 6 (six) hours as needed for wheezing or shortness of breath.   aspirin EC 81 MG tablet Take 1 tablet (81 mg total) by mouth 2 (two) times daily. What changed: when to take this   bisoprolol 5 MG tablet Commonly known as: ZEBETA TAKE 1 TABLET(5  MG) BY MOUTH DAILY   busPIRone 15 MG tablet Commonly known as: BUSPAR Take 15 mg by mouth 2 (two) times daily.   Emgality 120 MG/ML Soaj Generic drug: Galcanezumab-gnlm INJECT 11ml UNDER THE SKIN every 30 DAYS   gabapentin 300 MG capsule Commonly known as: NEURONTIN Take 300 mg by mouth in the morning, at noon, and at bedtime.   hydrochlorothiazide 12.5 MG capsule Commonly known as: MICROZIDE Take 12.5 mg by mouth in the morning.   Ketorolac Tromethamine 15.75 MG/SPRAY Soln Use 1 spray nasally every 6 hours as needed. max daily dose of 4   Ketorolac Tromethamine 15.75 MG/SPRAY Soln Commonly known as: Sprix Place 1 spray into the nose every 6 (six) hours as needed. Max daily dose of 4 doses   ketotifen 0.025 % ophthalmic solution Commonly known as: ZADITOR 1 drop every 12 (twelve) hours as needed (watery eyes/itching eyes).   Latuda 20 MG Tabs tablet Generic drug: lurasidone Take 20 mg by mouth at bedtime.   levocetirizine 5 MG tablet Commonly known as: XYZAL Take 5 mg by mouth at bedtime.   meloxicam 15 MG tablet Commonly known as: MOBIC Take 15 mg by mouth daily.   metFORMIN 500 MG 24 hr tablet Commonly known as: GLUCOPHAGE-XR Take 500 mg by mouth every evening.   mometasone 50 MCG/ACT nasal spray Commonly known as: NASONEX Place 2 sprays into the nose in the morning and at bedtime.   montelukast 10 MG tablet Commonly known as: SINGULAIR Take 10 mg by mouth at bedtime.   Oxycodone HCl 10 MG  Tabs Take 1 tablet (10 mg total) by mouth every 3 (three) hours as needed for severe pain ((score 7 to 10)).   pantoprazole 40 MG tablet Commonly known as: PROTONIX Take 40 mg by mouth 2 (two) times daily before a meal.   rosuvastatin 20 MG tablet Commonly known as: CRESTOR Take 20 mg by mouth at bedtime.   sertraline 100 MG tablet Commonly known as: ZOLOFT Take 100-200 mg by mouth See admin instructions. Take 1 tablet (100 mg) by mouth in the morning at breakfast  & take 2 tablets (200 mg) by mouth at bedtime.   tiZANidine 4 MG tablet Commonly known as: ZANAFLEX TAKE TWO TABLETS BY MOUTH EVERYDAY AT BEDTIME   Trokendi XR 100 MG Cp24 Generic drug: Topiramate ER TAKE ONE CAPSULE BY MOUTH EVERYDAY AT BEDTIME   Trokendi XR 50 MG Cp24 Generic drug: Topiramate ER TAKE ONE CAPSULE BY MOUTH EVERYDAY AT BEDTIME   Vitamin D3 125 MCG (5000 UT) Caps Take 10,000 Units by mouth every Monday, Tuesday, Wednesday, Thursday, and Friday.         Signed: Elaina Hoops 06/26/2021, 7:56 AM

## 2021-06-26 NOTE — Progress Notes (Addendum)
Subjective: Patient reports moderate back pain Objective: Vital signs in last 24 hours: Temp:  [98.2 F (36.8 C)-99.7 F (37.6 C)] 99.6 F (37.6 C) (12/01 0357) Pulse Rate:  [83-101] 100 (12/01 0357) Resp:  [18-20] 20 (12/01 0357) BP: (102-133)/(50-72) 133/54 (12/01 0357) SpO2:  [93 %-97 %] 95 % (12/01 0357)  Intake/Output from previous day: No intake/output data recorded. Intake/Output this shift: No intake/output data recorded.  Neurologic: Grossly normal  Lab Results: Lab Results  Component Value Date   WBC 7.4 06/18/2021   HGB 8.8 (L) 06/23/2021   HCT 26.0 (L) 06/23/2021   MCV 89.9 06/18/2021   PLT 201 06/18/2021   Lab Results  Component Value Date   INR 1.02 06/22/2018   BMET Lab Results  Component Value Date   NA 141 06/23/2021   K 3.3 (L) 06/23/2021   CL 107 06/18/2021   CO2 27 06/18/2021   GLUCOSE 130 (H) 06/18/2021   BUN 12 06/18/2021   CREATININE 0.89 06/18/2021   CALCIUM 9.6 06/18/2021    Studies/Results: No results found.  Assessment/Plan: Postop day3  lumbar fusion, doing ok but having a lot of back pain. Continue pain management and therapy today. Likely discharge home tomorrow   LOS: 3 days    Kathreen Dileo [facility] 06/26/2021, 7:50 AM

## 2021-06-26 NOTE — Progress Notes (Signed)
Physical Therapy Treatment Patient Details Name: Wanda [name] MRN: [id number] DOB: 08-17-1965 Today's Date: 06/26/2021   History of Present Illness Pt is a 55yo female who presenting with worsening back pain radiating down bilat LEs, R greater than L. Found to have spinal stenosis at L2-3. Pt underwent elective PLIF at L2-3 on 11/28. PSH: previous back surgeries, bilat TKA PMH: DM, depression, TIA, HTN, hypercholesterolemia    PT Comments    Pt progressing well with post-op mobility. She was able to demonstrate transfers with up to min guard assist and ambulation with gross supervision for safety and RW for support. Pt was educated on precautions, brace application/wearing schedule, appropriate activity progression, and car transfer. Will continue to follow.      Recommendations for follow up therapy are one component of a multi-disciplinary discharge planning process, led by the attending physician.  Recommendations may be updated based on patient status, additional functional criteria and insurance authorization.  Follow Up Recommendations  No PT follow up     Assistance Recommended at Discharge PRN  Equipment Recommendations  BSC/3in1    Recommendations for Other Services       Precautions / Restrictions Precautions Precautions: Back;Fall Precaution Booklet Issued: Yes (comment) Precaution Comments: Pt able to report 3/3 precautions Required Braces or Orthoses: Spinal Brace Spinal Brace: Applied in sitting position Restrictions Weight Bearing Restrictions: No Other Position/Activity Restrictions: lifting restrictions regarding back precautions     Mobility  Bed Mobility Overal bed mobility: Modified Independent Bed Mobility: Sidelying to Sit;Sit to Sidelying;Supine to Sit           General bed mobility comments: Pt requires increased time to complete task. Does well w/ maintaining spinal precaution.    Transfers Overall transfer level: Needs  assistance Equipment used: Rolling walker (2 wheels) Transfers: Sit to/from Stand Sit to Stand: Min guard;Modified independent (Device/Increase time);From elevated surface           General transfer comment: VC for scooting to EOB and foot placement. Required minguard and increased bed height to power up for sit>stand. Pt demonstrated proper hand placement on seated surface for safety with stand>sit. No assist required.    Ambulation/Gait Ambulation/Gait assistance: Supervision Gait Distance (Feet): 200 Feet Assistive device: Rolling walker (2 wheels) Gait Pattern/deviations: Step-through pattern;Decreased step length - right;Decreased step length - left;Decreased stride length Gait velocity: decreased Gait velocity interpretation: <1.31 ft/sec, indicative of household ambulator   General Gait Details: Pt with good upright posture, slow and steady pace, supervision for safety. Decreased endurance, requires frequent breaks.   Stairs             Wheelchair Mobility    Modified Rankin (Stroke Patients Only)       Balance Overall balance assessment: Needs assistance Sitting-balance support: Feet supported;No upper extremity supported Sitting balance-Leahy Scale: Good Sitting balance - Comments: Maintains sitting balance independently and is able to maintain upright posture while scooting and weight shifting.   Standing balance support: Bilateral upper extremity supported;During functional activity;Reliant on assistive device for balance Standing balance-Leahy Scale: Fair Standing balance comment: BUE support from RW to maintain upright posture and balance during static standing and ambulation.                            Cognition Arousal/Alertness: Awake/alert Behavior During Therapy: WFL for tasks assessed/performed Overall Cognitive Status: Within Functional Limits for tasks assessed  Exercises       General Comments        Pertinent Vitals/Pain Pain Assessment: 0-10 Pain Score: 6  Pain Location: Back, hip Pain Descriptors / Indicators: Spasm Pain Intervention(s): Monitored during session    Home Living                          Prior Function            PT Goals (current goals can now be found in the care plan section) Acute Rehab PT Goals Patient Stated Goal: Get home today PT Goal Formulation: With patient Time For Goal Achievement: 07/07/21 Potential to Achieve Goals: Good Progress towards PT goals: Progressing toward goals    Frequency    Min 5X/week      PT Plan Current plan remains appropriate    Co-evaluation              AM-PAC PT "6 Clicks" Mobility   Outcome Measure  Help needed turning from your back to your side while in a flat bed without using bedrails?: None Help needed moving from lying on your back to sitting on the side of a flat bed without using bedrails?: None Help needed moving to and from a bed to a chair (including a wheelchair)?: A Little Help needed standing up from a chair using your arms (e.g., wheelchair or bedside chair)?: A Little Help needed to walk in hospital room?: A Little Help needed climbing 3-5 steps with a railing? : A Little 6 Click Score: 20    End of Session Equipment Utilized During Treatment: Gait belt;Back brace Activity Tolerance: Patient tolerated treatment well Patient left: in bed;with call bell/phone within reach Nurse Communication: Mobility status PT Visit Diagnosis: Unsteadiness on feet (R26.81);Muscle weakness (generalized) (M62.81);Difficulty in walking, not elsewhere classified (R26.2)     Time: 2505-3976 PT Time Calculation (min) (ACUTE ONLY): 27 min  Charges:  $Gait Training: 23-37 mins                     Wanda [name], PT, DPT Acute Rehabilitation Services Pager: [phone] Office: [phone]    Thelma Comp 06/26/2021, 9:46 AM

## 2021-06-26 NOTE — Discharge Instructions (Signed)

## 2021-06-26 NOTE — Progress Notes (Signed)
Patient awaiting transport via wheelchair by volunteer for discharge home; in no acute distress nor complaints of pain nor discomfort; incision on her back with honeycomb dressing and is clean, dry and intact; room was checked and accounted for all her belongings; discharge instructions concerning her medications, incision care, follow up appointment and when to call the doctor as needed were all discussed with patient by RN and she expressed understanding on the instructions given.

## 2021-06-26 NOTE — Plan of Care (Signed)

## 2021-07-02 ENCOUNTER — Telehealth: Payer: Self-pay | Admitting: Neurology

## 2021-07-02 NOTE — Telephone Encounter (Signed)
Charisse from [facility] called an informed that The spray is taken infrequently and it is ok for them both to be on her medication list

## 2021-07-02 NOTE — Telephone Encounter (Signed)
Charisse from [facility] called and said the aspirin and ketorolac spray is coming up as contra-indicated in their system.

## 2021-07-09 NOTE — Progress Notes (Signed)
Virtual Visit via Video Note The purpose of this virtual visit is to provide medical care while limiting exposure to the novel coronavirus.    Consent was obtained for video visit:  Yes.   Answered questions that patient had about telehealth interaction:  Yes.   I discussed the limitations, risks, security and privacy concerns of performing an evaluation and management service by telemedicine. I also discussed with the patient that there may be a patient responsible charge related to this service. The patient expressed understanding and agreed to proceed.  Pt location: Home Physician Location: office Name of referring provider:  Marda Stalker, PA-C I connected with Wanda [name] at patients initiation/request on 07/10/2021 at 10:50 AM EST by video enabled telemedicine application and verified that I am speaking with the correct person using two identifiers. Pt MRN:  [id number] Pt DOB:  1966-02-17 Video Participants:  Wanda [name]  Assessment and Plan:   Migraine with aura, without status migrainosus, not intractable Hemiplegic migraine Vertigo - still possibly BPPV but if she should have another episode of vertigo that is persistent/longer duration and different than previous vertigo, recommend going to ED.    Migraine prevention:  Emgality, Trokendi XR 150mg  daily Migraine rescue:  Sprix NS Limit use of pain relievers to no more than 2 days out of week to prevent risk of rebound or medication-overuse headache. Keep headache diary Follow up 7-8 months.   History of Present Illness:  Wanda [name] is a 55 year old right-handed woman with Paget's disease, osteoarthritis, fibromyalgia, GERD, IBS, hyperlipidemia, carotid artery disease and bilateral knee replacements who follows up for migraine and hemiplegic migraine.   UPDATE:  Intensity:  moderate-severe Duration:  Usually 1-2 hours Frequency:  2 a week (1 a month may be severe) Frequency of abortive  medication: 1 day a week  Reports history of vertigo.  Several weeks ago, she had persistent vertigo lasting over a day.  Her eyes "kept jumping back and forth".  It gradually resolved over 3 days.  This was different than previous vertigo.    Rescue:  Sprix NS with Benadryl Current NSAIDS: ASA 81mg ; Sprix nasal spray Current analgesics: Hydrocodone (for pain related to Paget's) Current triptans: None Current ergotamine: None Current anti-emetic: Zofran 8 mg Current muscle relaxants: Tizanidine 8 mg at bedtime (to help with sleep) Current anti-anxiolytic: BuSpar Current sleep aide:  Latuda Current Antihypertensive medications: Zepeta Current Antidepressant medications: Sertraline 300 mg Current Anticonvulsant medications: Trokendi XR 150 mg Current anti-CGRP:  Emgality Current Vitamins/Herbal/Supplements: Ferrous sulfate Current Antihistamines/Decongestants: Benadryl Other therapy: Dry needling Hormone: None     Caffeine: No Alcohol: No Smoker: No Diet: Hydrates, no fried foods or dairy, no sweets or soda Exercise: On-hold.  Just had back surgery. Depression/Anxiety:   Other pain: Fibromyalgia.  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  Recovering well. Sleep hygiene: Started a CPAP for OSA - sleep much improved.   HISTORY: On 10/28/11, she was hospitalized for severe left-sided headache associated with slurred speech, left facial droop and left sided arm and leg weakness and numbness.  Headache lasted a day and weakness lasted a few days but she reports mild residual weakness.  MRI and MRA of head were personally reviewed and were negative for acute stroke.  Carotid doppler showed 40-59% bilateral ICA stenosis with antegrade flow in the vertebral arteries.  Echo showed EF 60% with no PFO or ASD.  She was diagnosed with TIA and started on ASA.  She has subsequently had several other similar episodes.  CT head and MRI brain from 06/02/12, CT  head and MRI of brain from 10/28/12, CT head and MRI brain from 06/02/15, and MRI brain from 10/03/15 were all personally reviewed and were unremarkable.  MRI/MRA/MRV of head from 02/28/13 were unremarkable.  Carotid doppler from 06/26/16 showed minor carotid atherosclerosis but no hemodynamically significant stenosis.   She has left sided throbbing headache/left retro-orbital without the weakness as well.  It is severe and associated with nausea and photophobia.  Sometimes preceded by a visual aura of either spotty vision or a yellow strip in her visual field. They last 1 to 2 days. They occur 2 to 3 days per week.  She has 25 headache days per month. Triggers:  Dairy, cigarette smoke Relieving factors:  rest Activity:  Cannot function with severe headache.   She had a hemiplegic migraine on 10/11/17 with left sided  Facial weakness as well as numbness and tingling of the left face, arm and leg.  She went to the ED as a stroke code.  MRI of brain was normal.  CT perfusion was negative.  CTA of head and neck showed mild right ICA origin soft and calcified plaque but no intracranial or extracranial large vessel stenosis or occlusion.   Past NSAIDS:  Naproxen (GI upset), ibuprofen, Cambia (GI upset), Sprix NS (effective but no longer available) Past analgesics:  Excedrin, Tylenol, Percocet (hives), Norco, tramadol (hot flashes) Past abortive triptans:  Relpax Past muscle relaxants: no Past anti-emetic:  Phenergan 25mg , Thorazine (effective for migraine abortive therapy but exacerbated hemiplegic migraines) Past antihypertensive medications:  verapamil 24hr 180mg , HCTZ, lisinopril, atenolol 50mg  Past antidepressant medications:  nortriptyline 50mg  Past anticonvulsant medications:  Depakote, zonisamide, topiramate 100mg  twice daily (effective but caused memory problems and paresthesias), gabapentin 300mg  three times daily Past anti-CGRP:  None Past vitamins/Herbal/Supplements:  no Other past therapy:  Botox (2  rounds)   Family history of headache:  mother  Past Medical History: Past Medical History:  Diagnosis Date   Acne    Anemia    Anxiety    Asthma    Chronic bronchitis ([place])    Depression    Diabetes mellitus without complication ([place]) 9528   type 2   Family history of adverse reaction to anesthesia    pt's mother has hx. of post-op N/V   Fibromyalgia    GERD (gastroesophageal reflux disease)    History of blood transfusion    History of TIA (transient ischemic attack)    x 2; left-sided weakness   Hypercholesterolemia    no current med.   Hypertension    states under control with meds., has been on med. since 06/2011   Keloid 41/3244   umbilical   Migraines    Osteoarthritis    Osteoarthritis    Paget disease of bone    PONV (postoperative nausea and vomiting)    states severe   Seasonal allergies    Sleep apnea 2021   Stroke ([place]) 10/27/2012   TIA   1- 10/29/11-weakness left side    Medications: Outpatient Encounter Medications as of 07/10/2021  Medication Sig Note   albuterol (PROVENTIL) (2.5 MG/3ML) 0.083% nebulizer solution Take 2.5 mg by nebulization every 6 (six) hours as needed for wheezing or shortness of breath.    albuterol (VENTOLIN HFA) 108 (90 Base) MCG/ACT inhaler Inhale 2 puffs into the lungs every 6 (six) hours as needed for wheezing or shortness of breath.    aspirin EC  81 MG tablet Take 1 tablet (81 mg total) by mouth 2 (two) times daily. (Patient taking differently: Take 81 mg by mouth daily with breakfast.)    bisoprolol (ZEBETA) 5 MG tablet TAKE 1 TABLET(5 MG) BY MOUTH DAILY    busPIRone (BUSPAR) 15 MG tablet Take 15 mg by mouth 2 (two) times daily.     Cholecalciferol (VITAMIN D3) 125 MCG (5000 UT) CAPS Take 10,000 Units by mouth every Monday, Tuesday, Wednesday, Thursday, and Friday.    gabapentin (NEURONTIN) 300 MG capsule Take 300 mg by mouth in the morning, at noon, and at bedtime.    Galcanezumab-gnlm (EMGALITY) 120 MG/ML SOAJ INJECT 33ml  UNDER THE SKIN every 30 DAYS    hydrochlorothiazide (MICROZIDE) 12.5 MG capsule Take 12.5 mg by mouth in the morning.    Ketorolac Tromethamine (SPRIX) 15.75 MG/SPRAY SOLN Place 1 spray into the nose every 6 (six) hours as needed. Max daily dose of 4 doses    Ketorolac Tromethamine 15.75 MG/SPRAY SOLN Use 1 spray nasally every 6 hours as needed. max daily dose of 4 (Patient not taking: Reported on 06/12/2021)    ketotifen (ZADITOR) 0.025 % ophthalmic solution 1 drop every 12 (twelve) hours as needed (watery eyes/itching eyes).    LATUDA 20 MG TABS tablet Take 20 mg by mouth at bedtime.    levocetirizine (XYZAL) 5 MG tablet Take 5 mg by mouth at bedtime.    meloxicam (MOBIC) 15 MG tablet Take 15 mg by mouth daily. 06/12/2021: On hold due to upcoming procedure.   metFORMIN (GLUCOPHAGE-XR) 500 MG 24 hr tablet Take 500 mg by mouth every evening.    mometasone (NASONEX) 50 MCG/ACT nasal spray Place 2 sprays into the nose in the morning and at bedtime.    montelukast (SINGULAIR) 10 MG tablet Take 10 mg by mouth at bedtime.    oxyCODONE 10 MG TABS Take 1 tablet (10 mg total) by mouth every 3 (three) hours as needed for severe pain ((score 7 to 10)).    pantoprazole (PROTONIX) 40 MG tablet Take 40 mg by mouth 2 (two) times daily before a meal.     rosuvastatin (CRESTOR) 20 MG tablet Take 20 mg by mouth at bedtime.    sertraline (ZOLOFT) 100 MG tablet Take 100-200 mg by mouth See admin instructions. Take 1 tablet (100 mg) by mouth in the morning at breakfast & take 2 tablets (200 mg) by mouth at bedtime.    tiZANidine (ZANAFLEX) 4 MG tablet TAKE TWO TABLETS BY MOUTH EVERYDAY AT BEDTIME    TROKENDI XR 100 MG CP24 TAKE ONE CAPSULE BY MOUTH EVERYDAY AT BEDTIME 06/12/2021: 100 mg + 50 mg=150 mg at bedtime   TROKENDI XR 50 MG CP24 TAKE ONE CAPSULE BY MOUTH EVERYDAY AT BEDTIME 06/12/2021: 50 mg + 100 mg=150 mg at bedtime    No facility-administered encounter medications on file as of 07/10/2021.     Allergies: Allergies  Allergen Reactions   Lisinopril Swelling and Other (See Comments)    SWELLING FACE/LIPS/NECK   Latex Rash    Skin peeling   Tape Rash    Skin tears/burns.    Family History: Family History  Problem Relation Age of Onset   Anesthesia problems Mother        post-op N/V   Anxiety disorder Father    Depression Father    Cervical cancer Maternal Grandmother    Cancer Maternal Grandmother        mouth   Heart attack Maternal Grandmother 19  Hypertension Brother    Heart failure Brother        ICD    Observations/Objective:   Height 5\' 6"  (1.676 m), weight 249 lb (112.9 kg), last menstrual period 10/14/2002. No acute distress.  Alert and oriented.  Speech fluent and not dysarthric.  Language intact.     Follow Up Instructions:    -I discussed the assessment and treatment plan with the patient. The patient was provided an opportunity to ask questions and all were answered. The patient agreed with the plan and demonstrated an understanding of the instructions.   The patient was advised to call back or seek an in-person evaluation if the symptoms worsen or if the condition fails to improve as anticipated.    Dudley Major, DO

## 2021-07-10 ENCOUNTER — Other Ambulatory Visit: Payer: Self-pay

## 2021-07-10 ENCOUNTER — Telehealth (INDEPENDENT_AMBULATORY_CARE_PROVIDER_SITE_OTHER): Payer: Medicare Other | Admitting: Neurology

## 2021-07-10 ENCOUNTER — Encounter: Payer: Self-pay | Admitting: Neurology

## 2021-07-10 VITALS — Ht 66.0 in | Wt 249.0 lb

## 2021-07-10 DIAGNOSIS — G43409 Hemiplegic migraine, not intractable, without status migrainosus: Secondary | ICD-10-CM | POA: Diagnosis not present

## 2021-07-10 DIAGNOSIS — G43109 Migraine with aura, not intractable, without status migrainosus: Secondary | ICD-10-CM

## 2021-07-14 ENCOUNTER — Other Ambulatory Visit: Payer: Self-pay | Admitting: Neurology

## 2021-08-08 ENCOUNTER — Other Ambulatory Visit: Payer: Self-pay | Admitting: Neurology

## 2021-08-27 DIAGNOSIS — E538 Deficiency of other specified B group vitamins: Secondary | ICD-10-CM | POA: Diagnosis not present

## 2021-09-03 DIAGNOSIS — E538 Deficiency of other specified B group vitamins: Secondary | ICD-10-CM | POA: Diagnosis not present

## 2021-09-09 DIAGNOSIS — E538 Deficiency of other specified B group vitamins: Secondary | ICD-10-CM | POA: Diagnosis not present

## 2021-09-12 ENCOUNTER — Other Ambulatory Visit: Payer: Self-pay | Admitting: Neurology

## 2021-09-17 DIAGNOSIS — E538 Deficiency of other specified B group vitamins: Secondary | ICD-10-CM | POA: Diagnosis not present

## 2021-09-29 DIAGNOSIS — M5451 Vertebrogenic low back pain: Secondary | ICD-10-CM | POA: Diagnosis not present

## 2021-10-01 DIAGNOSIS — E538 Deficiency of other specified B group vitamins: Secondary | ICD-10-CM | POA: Diagnosis not present

## 2021-10-06 DIAGNOSIS — M5451 Vertebrogenic low back pain: Secondary | ICD-10-CM | POA: Diagnosis not present

## 2021-10-10 ENCOUNTER — Other Ambulatory Visit (HOSPITAL_BASED_OUTPATIENT_CLINIC_OR_DEPARTMENT_OTHER): Payer: Self-pay | Admitting: Family Medicine

## 2021-10-10 DIAGNOSIS — R1011 Right upper quadrant pain: Secondary | ICD-10-CM | POA: Diagnosis not present

## 2021-10-10 DIAGNOSIS — D649 Anemia, unspecified: Secondary | ICD-10-CM | POA: Diagnosis not present

## 2021-10-10 DIAGNOSIS — C50011 Malignant neoplasm of nipple and areola, right female breast: Secondary | ICD-10-CM | POA: Diagnosis not present

## 2021-10-12 ENCOUNTER — Ambulatory Visit (HOSPITAL_BASED_OUTPATIENT_CLINIC_OR_DEPARTMENT_OTHER)
Admission: RE | Admit: 2021-10-12 | Discharge: 2021-10-12 | Disposition: A | Discharge status: Home / Self Care | Payer: Medicare Other | Source: Ambulatory Visit | Attending: Family Medicine | Admitting: Family Medicine

## 2021-10-12 ENCOUNTER — Other Ambulatory Visit: Payer: Self-pay

## 2021-10-12 DIAGNOSIS — R1011 Right upper quadrant pain: Secondary | ICD-10-CM | POA: Insufficient documentation

## 2021-10-12 MED ORDER — IOHEXOL 300 MG/ML  SOLN
100.0000 mL | Freq: Once | INTRAMUSCULAR | Status: AC | PRN
  Administered 2021-10-12: 100 mL via INTRAVENOUS

## 2021-10-13 ENCOUNTER — Telehealth: Payer: Self-pay | Admitting: Hematology

## 2021-10-13 NOTE — Telephone Encounter (Signed)
Scheduled appt per 3/20 referral. Pt is aware of appt date and time. Pt is aware to arrive 15 mins prior to appt time and to bring and updated insurance card. Pt is aware of appt location.   ?

## 2021-10-16 DIAGNOSIS — E538 Deficiency of other specified B group vitamins: Secondary | ICD-10-CM | POA: Diagnosis not present

## 2021-10-20 DIAGNOSIS — M5451 Vertebrogenic low back pain: Secondary | ICD-10-CM | POA: Diagnosis not present

## 2021-10-21 DIAGNOSIS — M4807 Spinal stenosis, lumbosacral region: Secondary | ICD-10-CM | POA: Diagnosis not present

## 2021-10-30 ENCOUNTER — Inpatient Hospital Stay: Payer: Medicare Other | Attending: Hematology | Admitting: Hematology

## 2021-10-30 ENCOUNTER — Other Ambulatory Visit: Payer: Self-pay

## 2021-10-30 VITALS — BP 137/61 | HR 78 | Temp 97.9°F | Resp 20 | Wt 259.6 lb

## 2021-10-30 DIAGNOSIS — Z808 Family history of malignant neoplasm of other organs or systems: Secondary | ICD-10-CM | POA: Insufficient documentation

## 2021-10-30 DIAGNOSIS — Z818 Family history of other mental and behavioral disorders: Secondary | ICD-10-CM | POA: Diagnosis not present

## 2021-10-30 DIAGNOSIS — F419 Anxiety disorder, unspecified: Secondary | ICD-10-CM | POA: Diagnosis not present

## 2021-10-30 DIAGNOSIS — F32A Depression, unspecified: Secondary | ICD-10-CM | POA: Diagnosis not present

## 2021-10-30 DIAGNOSIS — K219 Gastro-esophageal reflux disease without esophagitis: Secondary | ICD-10-CM | POA: Insufficient documentation

## 2021-10-30 DIAGNOSIS — Z9049 Acquired absence of other specified parts of digestive tract: Secondary | ICD-10-CM | POA: Diagnosis not present

## 2021-10-30 DIAGNOSIS — M797 Fibromyalgia: Secondary | ICD-10-CM | POA: Insufficient documentation

## 2021-10-30 DIAGNOSIS — M47814 Spondylosis without myelopathy or radiculopathy, thoracic region: Secondary | ICD-10-CM | POA: Insufficient documentation

## 2021-10-30 DIAGNOSIS — M549 Dorsalgia, unspecified: Secondary | ICD-10-CM | POA: Insufficient documentation

## 2021-10-30 DIAGNOSIS — I10 Essential (primary) hypertension: Secondary | ICD-10-CM | POA: Diagnosis not present

## 2021-10-30 DIAGNOSIS — Z409 Encounter for prophylactic surgery, unspecified: Secondary | ICD-10-CM | POA: Insufficient documentation

## 2021-10-30 DIAGNOSIS — Z888 Allergy status to other drugs, medicaments and biological substances status: Secondary | ICD-10-CM | POA: Diagnosis not present

## 2021-10-30 DIAGNOSIS — M889 Osteitis deformans of unspecified bone: Secondary | ICD-10-CM | POA: Insufficient documentation

## 2021-10-30 DIAGNOSIS — M5451 Vertebrogenic low back pain: Secondary | ICD-10-CM | POA: Diagnosis not present

## 2021-10-30 DIAGNOSIS — Z8249 Family history of ischemic heart disease and other diseases of the circulatory system: Secondary | ICD-10-CM | POA: Insufficient documentation

## 2021-10-30 DIAGNOSIS — Z560 Unemployment, unspecified: Secondary | ICD-10-CM | POA: Insufficient documentation

## 2021-10-30 DIAGNOSIS — Z8673 Personal history of transient ischemic attack (TIA), and cerebral infarction without residual deficits: Secondary | ICD-10-CM | POA: Insufficient documentation

## 2021-10-30 DIAGNOSIS — Z79899 Other long term (current) drug therapy: Secondary | ICD-10-CM | POA: Diagnosis not present

## 2021-10-30 NOTE — Progress Notes (Signed)
? ? ?HEMATOLOGY/ONCOLOGY CLINIC NOTE ? ?Date of Service: 10/30/2021 ? ?Patient Care Team: ?Marda Stalker, PA-C as PCP - General (Family Medicine) ?Pieter Partridge, DO as Consulting Physician (Neurology) ? ?CHIEF COMPLAINTS/PURPOSE OF CONSULTATION:  ?Evaluation for Pagets disease of bone ? ?HISTORY OF PRESENTING ILLNESS:  ?Wanda [name] is a wonderful 56 y.o. female who has been referred to [place] by Marda Stalker, PA-C ?for evaluation and management of rib abnormality noted on the CT scan of the chest recently - concerning for possible metastatic disease. ? ?Patient has a history of TIA,/complex migraine with left sided weakness in past, anxiety, depression, dyslipidemia, GERD, hypertension, severe osteoarthritis (has had 4 back surgeries) was seen in the emergency room on 12/25/2015 with some right sided chest pain that sounded somewhat pleuritic in nature as well as some back pain. No associated rash or injury. No overt cough, fever, hemoptysis or overt shortness of breath. She notes that she feels subjectively short of breath due to the pain. Had not had any previous history of DVT or pulmonary embolism. ? ?In the ED the patient had a CTA of the chest on 12/25/2015 that was negative for pulmonary embolism. She was noted to have cortical thickening of the right posterior seventh rib which was new compared to previous CT scan on 05/03/2013. 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 ?She was noted to have significant multilevel bridging osteophytes in her thoracic spine with degenerative changes along the right seventh costovertebral junction. ?Recent labs have also shown an elevated alkaline phosphatase.  ? ?She reports having had issues with Iron deficiency anemia in early 2016 and reports having had an EGD/Colonoscopy with Dr Herbie Baltimore [facility])(i which she reports were normal. She notes that she was  recommended a capsule endoscopy which she has not yet scheduled. ?Currently is on oral iron replacement FESo4 1 tab po BID ? ?Last MMG about 3 yrs ago -notes that she is due for another now and shall be scheduling with through her PCP. ?Hysterectomy in 2005. ? ?Recently had bothersome diarrhea that has now resolved with immodium and probiotics. Notes some epigastric discomfort. ? ?Has had RLE leg swelling in 10/2015 after her rt knee surgery and had an LE venous US which was neg for DVT. ? ? ?INTERVAL HISTORY ? ?Wanda [name] is a 56 y.o. female was referred back here by Marda Stalker PA-C for follow-up for evaluation for recurrent Paget's disease of the bone. She notes that her chest wall pain is persistent. ?She reports inflammation caused by fibromyalgia in the cervical, shoulder, and elbow regions. ? ?She reports some recurrent back pain. She says that laying down and breathing worsens. She describes the feeling like "balloon" and the weight of breasts and density causes pressure on chest. She reports that the pain is worsened through palpation of upper abdominal region on the ribs. She says that if she takes a deep breath, it causes significant pain. She further notes that a gasp causes her pain as well. She notes the pain is worse this time around in comparison to beginning. She reports that she is not able to take anti inflammatory medications due to GI issues. She notes she had a recent back surgery for the L2 and L3 vertebrae, lower lumbar. She endorses acupuncture.  ? ?We discussed getting a bone density scan and a CT of chest w/o contrast.  ? ?She notes she currently takes vitamin  D3 50000 units once a week. ? ?We discussed that Omeprazole can cause certain vitamin deficiencies. ? ?We discussed PET CT done 03/21/22023 which revealed "1. Mildly motion degraded exam. ?2. Given this factor, no acute process or explanation for right ?upper quadrant pain. ?3. Redo lumbar spine fixation on 06/23/2021.  Posterior element fluid ?collection is most likely a postoperative seroma." ? ?We discussed Bone density done 04/26/2019 in detail.  ? ?No blood in stool. ?No fever, chills, night sweats. ?No new lumps, bumps, or lesions/rashes. ?No change in bowel habits. ?No new or unexpected weight loss. ?No other new or acute focal symptoms. ? ?MEDICAL HISTORY:  ?Past Medical History:  ?Diagnosis Date  ? Acne   ? Anemia   ? Anxiety   ? Asthma   ? Chronic bronchitis ([place])   ? Depression   ? Diabetes mellitus without complication ([place]) 5027  ? type 2  ? Family history of adverse reaction to anesthesia   ? pt's mother has hx. of post-op N/V  ? Fibromyalgia   ? GERD (gastroesophageal reflux disease)   ? History of blood transfusion   ? History of TIA (transient ischemic attack)   ? x 2; left-sided weakness  ? Hypercholesterolemia   ? no current med.  ? Hypertension   ? states under control with meds., has been on med. since 06/2011  ? Keloid 74/1287  ? umbilical  ? Migraines   ? Osteoarthritis   ? Osteoarthritis   ? Paget disease of bone   ? PONV (postoperative nausea and vomiting)   ? states severe  ? Seasonal allergies   ? Sleep apnea 2021  ? Stroke ([place]) 10/27/2012  ? TIA   1- 10/29/11-weakness left side  ? ? ?SURGICAL HISTORY: ?Past Surgical History:  ?Procedure Laterality Date  ? BACK SURGERY  06/23/2021  ? CARPAL TUNNEL RELEASE Left 12/25/2013  ? CARPAL TUNNEL RELEASE Right 03/29/2015  ? CHOLECYSTECTOMY  08/27/2009  ? DIAGNOSTIC LAPAROSCOPY  early 2005  ? ETHMOIDECTOMY Bilateral 04/30/2006  ? FUNCTIONAL ENDOSCOPIC SINUS SURGERY  04/30/2006  ? HYSTEROSCOPY WITH D & C  04/21/2002  ? with lysis of adhesions  ? KNEE ARTHROSCOPY Left 02/25/2000  ? LAMINOTOMY Left 03/27/2010  ? L5-S1; microdissection  ? LAMINOTOMY Bilateral 07/12/2007  ? L4-5  ? LUMBAR FUSION Left 07/18/2010  ? L4-5; L5-S1  ? LUMBAR FUSION  04/10/2014  ? L3-4, L4-5, L5-S1  ? NASAL TURBINATE REDUCTION  04/30/2006  ? inferior  ? ROTATOR CUFF REPAIR Right 2020  ?  TONSILLECTOMY    ? removed as a child  ? TOTAL ABDOMINAL HYSTERECTOMY  11/01/2003  ? with lysis of adhesions; ovaries retained  ? TOTAL KNEE ARTHROPLASTY Left 12/31/2014  ? Procedure: TOTAL LEFT KNEE ARTHROPLASTY;  Surgeon: Frederik Pear, MD;  Location: [place];  Service: Orthopedics;  Laterality: Left;  ? TOTAL KNEE ARTHROPLASTY Right 11/11/2015  ? Procedure: TOTAL KNEE ARTHROPLASTY;  Surgeon: Frederik Pear, MD;  Location: [place];  Service: Orthopedics;  Laterality: Right;  ? TOTAL KNEE REVISION Right 06/27/2018  ? Procedure: TOTAL KNEE REVISION- TIBIAL;  Surgeon: Frederik Pear, MD;  Location: [facility];  Service: Orthopedics;  Laterality: Right;  ? VAGINAL HYSTERECTOMY    ? for DUB in 10/2003; partial  ? ? ?SOCIAL HISTORY: ?Social History  ? ?Socioeconomic History  ? Marital status: Divorced  ?  Spouse name: Not on file  ? Number of children: 2  ? Years of education: Not on file  ? Highest education level: Bachelor's degree (e.g., BA,  AB, BS)  ?Occupational History  ? Occupation: Media planner  ?  Employer: UNEMPLOYED  ?  Comment: [employer]  ?Tobacco Use  ? Smoking status: Former  ?  Packs/day: 0.50  ?  Years: 15.00  ?  Pack years: 7.50  ?  Types: Cigarettes  ?  Quit date: 07/20/2004  ?  Years since quitting: 17.2  ? Smokeless tobacco: Never  ?Vaping Use  ? Vaping Use: Never used  ?Substance and Sexual Activity  ? Alcohol use: Not Currently  ?  Comment: socially, pt states very infrequent (wine)  ? Drug use: No  ? Sexual activity: Yes  ?  Partners: Male  ?Other Topics Concern  ? Not on file  ?Social History Narrative  ? Lives in [place] with daughter  ?   ? Servando Snare (c) [phone]  ? nigel       (c) [phone]  ?   ?   ? Divorced  ? 2 children  ? Right handed  ? Drinks coffee every other a day, soda sometimes, no tea  ? ?Social Determinants of Health  ? ?Financial Resource Strain: Not on file  ?Food Insecurity: Not on file  ?Transportation Needs: Not on file  ?Physical Activity: Not on file  ?Stress: Not on file  ?Social  Connections: Not on file  ?Intimate Partner Violence: Not on file  ? ? ?FAMILY HISTORY: ?Family History  ?Problem Relation Age of Onset  ? Anesthesia problems Mother   ?     post-op N/V  ? Anxiety disorder [place]

## 2021-11-06 ENCOUNTER — Other Ambulatory Visit: Payer: Self-pay | Admitting: Neurology

## 2021-11-06 ENCOUNTER — Encounter: Payer: Self-pay | Admitting: Hematology

## 2021-11-10 ENCOUNTER — Telehealth: Payer: Self-pay | Admitting: Hematology

## 2021-11-10 NOTE — Telephone Encounter (Signed)
Per 4/17 in basket called and spoke to pt.  Pt confirmed the rescheduled appointment  ?

## 2021-11-11 DIAGNOSIS — M5451 Vertebrogenic low back pain: Secondary | ICD-10-CM | POA: Diagnosis not present

## 2021-11-12 DIAGNOSIS — G4733 Obstructive sleep apnea (adult) (pediatric): Secondary | ICD-10-CM | POA: Diagnosis not present

## 2021-11-17 ENCOUNTER — Telehealth: Payer: Medicare Other | Admitting: Hematology

## 2021-11-19 ENCOUNTER — Encounter (HOSPITAL_COMMUNITY)
Admission: RE | Admit: 2021-11-19 | Discharge: 2021-11-19 | Disposition: A | Discharge status: Home / Self Care | Payer: Medicare Other | Source: Ambulatory Visit | Attending: Hematology | Admitting: Hematology

## 2021-11-19 ENCOUNTER — Encounter (HOSPITAL_COMMUNITY): Payer: Self-pay

## 2021-11-19 ENCOUNTER — Ambulatory Visit (HOSPITAL_COMMUNITY)
Admission: RE | Admit: 2021-11-19 | Discharge: 2021-11-19 | Disposition: A | Discharge status: Home / Self Care | Payer: Medicare Other | Source: Ambulatory Visit | Attending: Hematology | Admitting: Hematology

## 2021-11-19 DIAGNOSIS — I2510 Atherosclerotic heart disease of native coronary artery without angina pectoris: Secondary | ICD-10-CM | POA: Diagnosis not present

## 2021-11-19 DIAGNOSIS — M889 Osteitis deformans of unspecified bone: Secondary | ICD-10-CM

## 2021-11-19 DIAGNOSIS — M19011 Primary osteoarthritis, right shoulder: Secondary | ICD-10-CM | POA: Diagnosis not present

## 2021-11-19 DIAGNOSIS — I700 Atherosclerosis of aorta: Secondary | ICD-10-CM | POA: Insufficient documentation

## 2021-11-19 DIAGNOSIS — R0789 Other chest pain: Secondary | ICD-10-CM | POA: Diagnosis not present

## 2021-11-19 DIAGNOSIS — Z96653 Presence of artificial knee joint, bilateral: Secondary | ICD-10-CM | POA: Insufficient documentation

## 2021-11-19 DIAGNOSIS — M19012 Primary osteoarthritis, left shoulder: Secondary | ICD-10-CM | POA: Diagnosis not present

## 2021-11-19 MED ORDER — TECHNETIUM TC 99M MEDRONATE IV KIT
21.9000 | PACK | Freq: Once | INTRAVENOUS | Status: AC
  Administered 2021-11-19: 21.9 via INTRAVENOUS

## 2021-11-24 ENCOUNTER — Inpatient Hospital Stay: Payer: Medicare Other | Attending: Hematology | Admitting: Hematology

## 2021-11-24 DIAGNOSIS — M889 Osteitis deformans of unspecified bone: Secondary | ICD-10-CM

## 2021-11-24 DIAGNOSIS — Z8249 Family history of ischemic heart disease and other diseases of the circulatory system: Secondary | ICD-10-CM | POA: Diagnosis not present

## 2021-11-24 DIAGNOSIS — Z87891 Personal history of nicotine dependence: Secondary | ICD-10-CM | POA: Insufficient documentation

## 2021-11-24 DIAGNOSIS — Z818 Family history of other mental and behavioral disorders: Secondary | ICD-10-CM | POA: Insufficient documentation

## 2021-11-24 DIAGNOSIS — Z79899 Other long term (current) drug therapy: Secondary | ICD-10-CM | POA: Insufficient documentation

## 2021-11-24 DIAGNOSIS — Z8673 Personal history of transient ischemic attack (TIA), and cerebral infarction without residual deficits: Secondary | ICD-10-CM | POA: Insufficient documentation

## 2021-11-24 DIAGNOSIS — I700 Atherosclerosis of aorta: Secondary | ICD-10-CM | POA: Insufficient documentation

## 2021-11-24 DIAGNOSIS — Z8049 Family history of malignant neoplasm of other genital organs: Secondary | ICD-10-CM | POA: Insufficient documentation

## 2021-11-24 DIAGNOSIS — M8888 Osteitis deformans of other bones: Secondary | ICD-10-CM | POA: Diagnosis not present

## 2021-11-24 DIAGNOSIS — Z888 Allergy status to other drugs, medicaments and biological substances status: Secondary | ICD-10-CM | POA: Diagnosis not present

## 2021-11-24 DIAGNOSIS — Z9049 Acquired absence of other specified parts of digestive tract: Secondary | ICD-10-CM | POA: Diagnosis not present

## 2021-11-24 DIAGNOSIS — I10 Essential (primary) hypertension: Secondary | ICD-10-CM | POA: Diagnosis not present

## 2021-11-24 DIAGNOSIS — Z808 Family history of malignant neoplasm of other organs or systems: Secondary | ICD-10-CM | POA: Diagnosis not present

## 2021-11-24 NOTE — Progress Notes (Signed)
? ? ?HEMATOLOGY/ONCOLOGY PHONE VISIT NOTE ? ?Date of Service: 11/24/2021 ? ?Patient Care Team: ?Marda Stalker, PA-C as PCP - General (Family Medicine) ?Pieter Partridge, DO as Consulting Physician (Neurology) ? 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 ? ?HISTORY OF PRESENTING ILLNESS: Please see previous note for details on initial presentation ? ?INTERVAL HISTORY ? ?.I connected with Wanda [name] on 11/24/21 at 12:00 PM EDT by telephone visit and verified that I am speaking with the correct person using two identifiers.  ? ?I discussed the limitations, risks, security and privacy concerns of performing an evaluation and management service by telemedicine and the availability of in-person appointments. I also discussed with the patient that there may be a patient responsible charge related to this service. The patient expressed understanding and agreed to proceed.  ? ?Other persons participating in the visit and their role in the encounter: none ? ?Patient?s location: Home  ?Provider?s location: [place] ? ?Chief Complaint: Follow-up for evaluation of possible Paget's disease of the bone. ? ?Patient notes that she is still having significant pain between the shoulder blades in the back.  This has been chronic but now is getting unbearable. ?We discussed her CT chest on 11/19/2021 which shows no acute pulmonary findings, pulmonary lesions or pulmonary nodules.  Stable slightly expanded and markedly thickened right seventh posterior rib which is unchanged since 2019.  No other acute bony findings. ? ?We discussed her whole-body bone scan discussed on 11/19/2021.  This showed no focal abnormality associated with the right seventh rib.  Multifocal degenerative changes in both shoulder girdles and throughout the spine.  Prior lumbar spine instrumentation and bilateral knee joint  arthroplasties. ? ?MEDICAL HISTORY:  ?Past Medical History:  ?Diagnosis Date  ? Acne   ? Anemia   ? Anxiety   ? Asthma   ? Chronic bronchitis ([place])   ? Depression   ? Diabetes mellitus without complication ([place]) 8182  ? type 2  ? Family history of adverse reaction to anesthesia   ? pt's mother has hx. of post-op N/V  ? Fibromyalgia   ? GERD (gastroesophageal reflux disease)   ? History of blood transfusion   ? History of TIA (transient ischemic attack)   ? x 2; left-sided weakness  ? Hypercholesterolemia   ? no current med.  ? Hypertension   ? states under control with meds., has been on med. since 06/2011  ? Keloid 99/3716  ? umbilical  ? Migraines   ? Osteoarthritis   ? Osteoarthritis   ? Paget disease of bone   ? PONV (postoperative nausea and vomiting)   ? states severe  ? Seasonal allergies   ? Sleep apnea 2021  ? Stroke ([place]) 10/27/2012  ? TIA   1- 10/29/11-weakness left side  ? ? ?SURGICAL HISTORY: ?Past Surgical History:  ?Procedure Laterality Date  ? BACK SURGERY  06/23/2021  ? CARPAL TUNNEL RELEASE Left 12/25/2013  ? CARPAL TUNNEL RELEASE Right 03/29/2015  ? CHOLECYSTECTOMY  08/27/2009  ? DIAGNOSTIC LAPAROSCOPY  early 2005  ? ETHMOIDECTOMY Bilateral 04/30/2006  ? FUNCTIONAL ENDOSCOPIC SINUS SURGERY  04/30/2006  ? HYSTEROSCOPY WITH D & C  04/21/2002  ? with lysis of adhesions  ? KNEE ARTHROSCOPY Left 02/25/2000  ? LAMINOTOMY Left 03/27/2010  ? L5-S1; microdissection  ? LAMINOTOMY Bilateral 07/12/2007  ? L4-5  ? LUMBAR FUSION Left 07/18/2010  ? L4-5; L5-S1  ?  LUMBAR FUSION  04/10/2014  ? L3-4, L4-5, L5-S1  ? NASAL TURBINATE REDUCTION  04/30/2006  ? inferior  ? ROTATOR CUFF REPAIR Right 2020  ? TONSILLECTOMY    ? removed as a child  ? TOTAL ABDOMINAL HYSTERECTOMY  11/01/2003  ? with lysis of adhesions; ovaries retained  ? TOTAL KNEE ARTHROPLASTY Left 12/31/2014  ? Procedure: TOTAL LEFT KNEE ARTHROPLASTY;  Surgeon: Frederik Pear, MD;  Location: [facility];  Service: Orthopedics;  Laterality: Left;  ? TOTAL KNEE  ARTHROPLASTY Right 11/11/2015  ? Procedure: TOTAL KNEE ARTHROPLASTY;  Surgeon: Frederik Pear, MD;  Location: [place] [facility];  Service: Orthopedics;  Laterality: Right;  ? TOTAL KNEE REVISION Right 06/27/2018  ? Procedure: TOTAL KNEE REVISION- TIBIAL;  Surgeon: Frederik Pear, MD;  Location: [facility];  Service: Orthopedics;  Laterality: Right;  ? VAGINAL HYSTERECTOMY    ? for DUB in 10/2003; partial  ? ? ?SOCIAL HISTORY: ?Social History  ? ?Socioeconomic History  ? Marital status: Divorced  ?  Spouse name: Not on file  ? Number of children: 2  ? Years of education: Not on file  ? Highest education level: Bachelor's degree (e.g., BA, AB, BS)  ?Occupational History  ? Occupation: Media planner  ?  Employer: UNEMPLOYED  ?  Comment: [employer]  ?Tobacco Use  ? Smoking status: Former  ?  Packs/day: 0.50  ?  Years: 15.00  ?  Pack years: 7.50  ?  Types: Cigarettes  ?  Quit date: 07/20/2004  ?  Years since quitting: 17.3  ? Smokeless tobacco: Never  ?Vaping Use  ? Vaping Use: Never used  ?Substance and Sexual Activity  ? Alcohol use: Not Currently  ?  Comment: socially, pt states very infrequent (wine)  ? Drug use: No  ? Sexual activity: Yes  ?  Partners: Male  ?Other Topics Concern  ? Not on file  ?Social History Narrative  ? Lives in [place] with daughter  ?   ? Servando Snare (c) [phone]  ? nigel       (c) [phone]  ?   ?   ? Divorced  ? 2 children  ? Right handed  ? Drinks coffee every other a day, soda sometimes, no tea  ? ?Social Determinants of Health  ? ?Financial Resource Strain: Not on file  ?Food Insecurity: Not on file  ?Transportation Needs: Not on file  ?Physical Activity: Not on file  ?Stress: Not on file  ?Social Connections: Not on file  ?Intimate Partner Violence: Not on file  ? ? ?FAMILY HISTORY: ?Family History  ?Problem Relation Age of Onset  ? Anesthesia problems Mother   ?     post-op N/V  ? Anxiety disorder Father   ? Depression Father   ? Cervical cancer Maternal Grandmother   ? Cancer Maternal Grandmother   ?      mouth  ? Heart attack Maternal Grandmother 55  ? Hypertension Brother   ? Heart failure Brother   ?     ICD  ? ? ?ALLERGIES:  is allergic to lisinopril, latex, and tape. ? ?MEDICATIONS:  ?Current Outpatient Medications  ?Medication Sig Dispense Refill  ? albuterol (PROVENTIL) (2.5 MG/3ML) 0.083% nebulizer solution Take 2.5 mg by nebulization every 6 (six) hours as needed for wheezing or shortness of breath.    ? albuterol (VENTOLIN HFA) 108 (90 Base) MCG/ACT inhaler Inhale 2 puffs into the lungs every 6 (six) hours as needed for wheezing or shortness of breath.    ? aspirin EC 81  MG tablet Take 1 tablet (81 mg total) by mouth 2 (two) times daily. (Patient taking differently: Take 81 mg by mouth daily.) 60 tablet 0  ? bisoprolol (ZEBETA) 5 MG tablet TAKE 1 TABLET(5 MG) BY MOUTH DAILY 30 tablet 1  ? busPIRone (BUSPAR) 15 MG tablet Take 15 mg by mouth 2 (two) times daily.     ? Cholecalciferol (VITAMIN D3) 125 MCG (5000 UT) CAPS Take 10,000 Units by mouth every Monday, Tuesday, Wednesday, Thursday, and Friday.    ? EMGALITY 120 MG/ML SOAJ INJECT 33m UNDER THE SKIN every 30 DAYS 1 mL 5  ? gabapentin (NEURONTIN) 100 MG capsule Take 300 mg by mouth in the morning, at noon, and at bedtime.    ? hydrochlorothiazide (MICROZIDE) 12.5 MG capsule Take 12.5 mg by mouth in the morning.    ? Ketorolac Tromethamine (SPRIX) 15.75 MG/SPRAY SOLN Place 1 spray into the nose every 6 (six) hours as needed. Max daily dose of 4 doses 9 each 5  ? ketotifen (ZADITOR) 0.025 % ophthalmic solution 1 drop every 12 (twelve) hours as needed (watery eyes/itching eyes).    ? LATUDA 20 MG TABS tablet Take 20 mg by mouth at bedtime.    ? levocetirizine (XYZAL) 5 MG tablet Take 5 mg by mouth at bedtime.    ? meloxicam (MOBIC) 15 MG tablet Take 15 mg by mouth daily.    ? metFORMIN (GLUCOPHAGE-XR) 500 MG 24 hr tablet Take 500 mg by mouth every evening.    ? mometasone (NASONEX) 50 MCG/ACT nasal spray Place 2 sprays into the nose daily.    ? montelukast  (SINGULAIR) 10 MG tablet Take 10 mg by mouth at bedtime.    ? oxyCODONE 10 MG TABS Take 1 tablet (10 mg total) by mouth every 3 (three) hours as needed for severe pain ((score 7 to 10)). 30 tablet 0  ? pantoprazole (PROTO

## 2021-11-25 DIAGNOSIS — M25511 Pain in right shoulder: Secondary | ICD-10-CM | POA: Diagnosis not present

## 2021-12-01 DIAGNOSIS — M25511 Pain in right shoulder: Secondary | ICD-10-CM | POA: Diagnosis not present

## 2021-12-15 DIAGNOSIS — G4733 Obstructive sleep apnea (adult) (pediatric): Secondary | ICD-10-CM | POA: Diagnosis not present

## 2021-12-30 NOTE — Progress Notes (Unsigned)
NEUROLOGY FOLLOW UP OFFICE NOTE  Wanda [name] [id number]  Assessment/Plan:   Migraine with aura, without status migrainosus, not intractable Hemiplegic migraine Vertigo- vestibular migraine vs BPPV however given severity and prior history of stroke-like spells, will check imaging.   MRI and MRA of brain Migraine prevention:  Emgality, Trokendi XR '150mg'$  daily Migraine rescue:  Sprix NS Limit use of pain relievers to no more than 2 days out of week to prevent risk of rebound or medication-overuse headache. Keep headache diary If vertigo/dysequilibrium persists or gets worse, refer to vestibular rehab Follow up 6 months.  Subjective:  Wanda [name] is a 56 year old right-handed woman with Paget's disease, osteoarthritis, fibromyalgia, GERD, IBS, hyperlipidemia, carotid artery disease and bilateral knee replacements who follows up for migraine and hemiplegic migraine.   UPDATE:  Intensity:  moderate-severe Duration:  Usually 1-2 hours Frequency:  2 a week (1 a month may be severe) Frequency of abortive medication: 1 day a week   Had another episode of severe vertigo with nystagmus and nausea that started Memorial Day weekend.  It was severe for 5 days.  Still has mild residual dizziness.  Reports some associated headache, but not severe.  Used Sprix which helped with the headache but not dizziness.  She did perform home Epley maneuver which helped a little bit.  Had to walk with a cane.  Just started driving again yesterday.  If she turns her head very fast to either side, she feels a "wavy" dizzy sensation.     Rescue:  Sprix NS with Benadryl Current NSAIDS: ASA '81mg'$ ; Sprix nasal spray Current analgesics: Hydrocodone (for pain related to Paget's) Current triptans: None Current ergotamine: None Current anti-emetic: Zofran 8 mg Current muscle relaxants: Tizanidine 8 mg at bedtime (to help with sleep) Current anti-anxiolytic: BuSpar Current sleep aide:  Latuda Current  Antihypertensive medications: Zepeta Current Antidepressant medications: Sertraline 300 mg Current Anticonvulsant medications: Trokendi XR 150 mg Current anti-CGRP:  Emgality Current Vitamins/Herbal/Supplements: Ferrous sulfate Current Antihistamines/Decongestants: Benadryl Other therapy: Dry needling Hormone: None     Caffeine: No Alcohol: No Smoker: No Diet: Hydrates, no fried foods or dairy, no sweets or soda Exercise: On-hold.  Just had back surgery. Depression/Anxiety:   Other pain: Fibromyalgia.  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  Recovering well. Sleep hygiene: Started a CPAP for OSA - sleep much improved.   HISTORY: On 10/28/11, she was hospitalized for severe left-sided headache associated with slurred speech, left facial droop and left sided arm and leg weakness and numbness.  Headache lasted a day and weakness lasted a few days but she reports mild residual weakness.  MRI and MRA of head were personally reviewed and were negative for acute stroke.  Carotid doppler showed 40-59% bilateral ICA stenosis with antegrade flow in the vertebral arteries.  Echo showed EF 60% with no PFO or ASD.  She was diagnosed with TIA and started on ASA.  She has subsequently had several other similar episodes.  CT head and MRI brain from 06/02/12, CT head and MRI of brain from 10/28/12, CT head and MRI brain from 06/02/15, and MRI brain from 10/03/15 were all personally reviewed and were unremarkable.  MRI/MRA/MRV of head from 02/28/13 were unremarkable.  Carotid doppler from 06/26/16 showed minor carotid atherosclerosis but no hemodynamically significant stenosis.   She has left sided throbbing headache/left retro-orbital without the weakness as well.  It is severe and associated with nausea and photophobia.  Sometimes preceded by  a visual aura of either spotty vision or a yellow strip in her visual field. They last 1 to 2 days. They occur 2 to 3 days per week.   She has 25 headache days per month. Triggers:  Dairy, cigarette smoke Relieving factors:  rest Activity:  Cannot function with severe headache.   She had a hemiplegic migraine on 10/11/17 with left sided  Facial weakness as well as numbness and tingling of the left face, arm and leg.  She went to the ED as a stroke code.  MRI of brain was normal.  CT perfusion was negative.  CTA of head and neck showed mild right ICA origin soft and calcified plaque but no intracranial or extracranial large vessel stenosis or occlusion.   Past NSAIDS:  Naproxen (GI upset), ibuprofen, Cambia (GI upset), Sprix NS (effective but no longer available) Past analgesics:  Excedrin, Tylenol, Percocet (hives), Norco, tramadol (hot flashes) Past abortive triptans:  Relpax Past muscle relaxants: no Past anti-emetic:  Phenergan '25mg'$ , Thorazine (effective for migraine abortive therapy but exacerbated hemiplegic migraines) Past antihypertensive medications:  verapamil 24hr '180mg'$ , HCTZ, lisinopril, atenolol '50mg'$  Past antidepressant medications:  nortriptyline '50mg'$  Past anticonvulsant medications:  Depakote, zonisamide, topiramate '100mg'$  twice daily (effective but caused memory problems and paresthesias), gabapentin '300mg'$  three times daily Past anti-CGRP:  None Past vitamins/Herbal/Supplements:  no Other past therapy:  Botox    Family history of headache:  mother    PAST MEDICAL HISTORY: Past Medical History:  Diagnosis Date   Acne    Anemia    Anxiety    Asthma    Chronic bronchitis ([place])    Depression    Diabetes mellitus without complication ([place]) 1017   type 2   Family history of adverse reaction to anesthesia    pt's mother has hx. of post-op N/V   Fibromyalgia    GERD (gastroesophageal reflux disease)    History of blood transfusion    History of TIA (transient ischemic attack)    x 2; left-sided weakness   Hypercholesterolemia    no current med.   Hypertension    states under control with meds., has  been on med. since 06/2011   Keloid 51/0258   umbilical   Migraines    Osteoarthritis    Osteoarthritis    Paget disease of bone    PONV (postoperative nausea and vomiting)    states severe   Seasonal allergies    Sleep apnea 2021   Stroke ([place]) 10/27/2012   TIA   1- 10/29/11-weakness left side    MEDICATIONS: Current Outpatient Medications on File Prior to Visit  Medication Sig Dispense Refill   albuterol (PROVENTIL) (2.5 MG/3ML) 0.083% nebulizer solution Take 2.5 mg by nebulization every 6 (six) hours as needed for wheezing or shortness of breath.     albuterol (VENTOLIN HFA) 108 (90 Base) MCG/ACT inhaler Inhale 2 puffs into the lungs every 6 (six) hours as needed for wheezing or shortness of breath.     aspirin EC 81 MG tablet Take 1 tablet (81 mg total) by mouth 2 (two) times daily. (Patient taking differently: Take 81 mg by mouth daily.) 60 tablet 0   bisoprolol (ZEBETA) 5 MG tablet TAKE 1 TABLET(5 MG) BY MOUTH DAILY 30 tablet 1   busPIRone (BUSPAR) 15 MG tablet Take 15 mg by mouth 2 (two) times daily.      Cholecalciferol (VITAMIN D3) 125 MCG (5000 UT) CAPS Take 10,000 Units by mouth every Monday, Tuesday, Wednesday, Thursday, and Friday.  EMGALITY 120 MG/ML SOAJ INJECT 13m UNDER THE SKIN every 30 DAYS 1 mL 5   gabapentin (NEURONTIN) 100 MG capsule Take 300 mg by mouth in the morning, at noon, and at bedtime.     hydrochlorothiazide (MICROZIDE) 12.5 MG capsule Take 12.5 mg by mouth in the morning.     Ketorolac Tromethamine (SPRIX) 15.75 MG/SPRAY SOLN Place 1 spray into the nose every 6 (six) hours as needed. Max daily dose of 4 doses 9 each 5   ketotifen (ZADITOR) 0.025 % ophthalmic solution 1 drop every 12 (twelve) hours as needed (watery eyes/itching eyes).     LATUDA 20 MG TABS tablet Take 20 mg by mouth at bedtime.     levocetirizine (XYZAL) 5 MG tablet Take 5 mg by mouth at bedtime.     meloxicam (MOBIC) 15 MG tablet Take 15 mg by mouth daily.     metFORMIN  (GLUCOPHAGE-XR) 500 MG 24 hr tablet Take 500 mg by mouth every evening.     mometasone (NASONEX) 50 MCG/ACT nasal spray Place 2 sprays into the nose daily.     montelukast (SINGULAIR) 10 MG tablet Take 10 mg by mouth at bedtime.     oxyCODONE 10 MG TABS Take 1 tablet (10 mg total) by mouth every 3 (three) hours as needed for severe pain ((score 7 to 10)). 30 tablet 0   pantoprazole (PROTONIX) 40 MG tablet Take 40 mg by mouth 2 (two) times daily before a meal.      rosuvastatin (CRESTOR) 20 MG tablet Take 20 mg by mouth at bedtime.     sertraline (ZOLOFT) 100 MG tablet Take 100-200 mg by mouth See admin instructions. Take 1 tablet (100 mg) by mouth in the morning at breakfast & take 2 tablets (200 mg) by mouth at bedtime.     tiZANidine (ZANAFLEX) 4 MG tablet TAKE TWO TABLETS BY MOUTH EVERYDAY AT BEDTIME 60 tablet 2   TROKENDI XR 100 MG CP24 TAKE ONE CAPSULE BY MOUTH EVERYDAY AT BEDTIME 90 capsule 5   TROKENDI XR 50 MG CP24 TAKE ONE CAPSULE BY MOUTH EVERYDAY AT BEDTIME 90 capsule 5   No current facility-administered medications on file prior to visit.    ALLERGIES: Allergies  Allergen Reactions   Lisinopril Swelling and Other (See Comments)    SWELLING FACE/LIPS/NECK   Latex Rash    Skin peeling   Tape Rash    Skin tears/burns.    FAMILY HISTORY: Family History  Problem Relation Age of Onset   Anesthesia problems Mother        post-op N/V   Anxiety disorder Father    Depression Father    Cervical cancer Maternal Grandmother    Cancer Maternal Grandmother        mouth   Heart attack Maternal Grandmother 575  Hypertension Brother    Heart failure Brother        ICD      Objective:  Blood pressure 110/70, pulse 88, height '5\' 6"'$  (1.676 m), weight 253 lb (114.8 kg), last menstrual period 10/14/2002, SpO2 93 %. General: No acute distress.  Patient appears well-groomed.   Head:  Normocephalic/atraumatic Eyes:  Fundi examined but not visualized Neck: supple, no paraspinal  tenderness, full range of motion Heart:  Regular rate and rhythm Neurological Exam: alert and oriented to person, place, and time.  Speech fluent and not dysarthric, language intact.  CN II-XII intact. Bulk and tone normal, muscle strength 5/5 throughout.  Sensation to light touch intact.  Deep  tendon reflexes 2+ throughout, toes downgoing.  Finger to nose testing intact.  Broad-based gait, Romberg with sway.   Metta Clines, DO  CC: Marda Stalker, PA-C

## 2021-12-31 ENCOUNTER — Ambulatory Visit: Payer: Medicare Other | Admitting: Neurology

## 2021-12-31 ENCOUNTER — Encounter: Payer: Self-pay | Admitting: Neurology

## 2021-12-31 VITALS — BP 110/70 | HR 88 | Ht 66.0 in | Wt 253.0 lb

## 2021-12-31 DIAGNOSIS — G450 Vertebro-basilar artery syndrome: Secondary | ICD-10-CM

## 2021-12-31 DIAGNOSIS — R42 Dizziness and giddiness: Secondary | ICD-10-CM | POA: Diagnosis not present

## 2021-12-31 DIAGNOSIS — G43109 Migraine with aura, not intractable, without status migrainosus: Secondary | ICD-10-CM | POA: Diagnosis not present

## 2021-12-31 NOTE — Patient Instructions (Addendum)
Will check MRI/MRA of brain Continue Emgality and Trokendi XR Sprix as needed Consider physical therapy for vertigo if gets worse Follow up 6 months.    We have sent a referral to [facility] for your MRI and MRA  they will call you directly to schedule your appointment. They are located at [place]. If you need to contact them directly please call [phone].

## 2022-01-05 ENCOUNTER — Other Ambulatory Visit: Payer: Self-pay | Admitting: Neurology

## 2022-01-07 ENCOUNTER — Emergency Department (HOSPITAL_COMMUNITY): Payer: Medicare Other

## 2022-01-07 ENCOUNTER — Emergency Department (HOSPITAL_COMMUNITY)
Admission: EM | Admit: 2022-01-07 | Discharge: 2022-01-07 | Disposition: A | Discharge status: Home / Self Care | Payer: Medicare Other | Attending: Emergency Medicine | Admitting: Emergency Medicine

## 2022-01-07 ENCOUNTER — Other Ambulatory Visit: Payer: Self-pay

## 2022-01-07 DIAGNOSIS — E119 Type 2 diabetes mellitus without complications: Secondary | ICD-10-CM | POA: Diagnosis not present

## 2022-01-07 DIAGNOSIS — R051 Acute cough: Secondary | ICD-10-CM | POA: Insufficient documentation

## 2022-01-07 DIAGNOSIS — M889 Osteitis deformans of unspecified bone: Secondary | ICD-10-CM | POA: Insufficient documentation

## 2022-01-07 DIAGNOSIS — Z91040 Latex allergy status: Secondary | ICD-10-CM | POA: Diagnosis not present

## 2022-01-07 DIAGNOSIS — J45909 Unspecified asthma, uncomplicated: Secondary | ICD-10-CM | POA: Insufficient documentation

## 2022-01-07 DIAGNOSIS — R0789 Other chest pain: Secondary | ICD-10-CM | POA: Diagnosis not present

## 2022-01-07 DIAGNOSIS — Z20822 Contact with and (suspected) exposure to covid-19: Secondary | ICD-10-CM | POA: Diagnosis not present

## 2022-01-07 DIAGNOSIS — I10 Essential (primary) hypertension: Secondary | ICD-10-CM | POA: Insufficient documentation

## 2022-01-07 DIAGNOSIS — Z7982 Long term (current) use of aspirin: Secondary | ICD-10-CM | POA: Diagnosis not present

## 2022-01-07 DIAGNOSIS — Z79899 Other long term (current) drug therapy: Secondary | ICD-10-CM | POA: Diagnosis not present

## 2022-01-07 DIAGNOSIS — M546 Pain in thoracic spine: Secondary | ICD-10-CM | POA: Diagnosis not present

## 2022-01-07 DIAGNOSIS — J40 Bronchitis, not specified as acute or chronic: Secondary | ICD-10-CM | POA: Diagnosis not present

## 2022-01-07 DIAGNOSIS — Z9049 Acquired absence of other specified parts of digestive tract: Secondary | ICD-10-CM | POA: Diagnosis not present

## 2022-01-07 DIAGNOSIS — Z7984 Long term (current) use of oral hypoglycemic drugs: Secondary | ICD-10-CM | POA: Diagnosis not present

## 2022-01-07 DIAGNOSIS — J209 Acute bronchitis, unspecified: Secondary | ICD-10-CM | POA: Diagnosis not present

## 2022-01-07 DIAGNOSIS — R0602 Shortness of breath: Secondary | ICD-10-CM | POA: Diagnosis not present

## 2022-01-07 LAB — CBC WITH DIFFERENTIAL/PLATELET
Abs Immature Granulocytes: 0.07 10*3/uL (ref 0.00–0.07)
Basophils Absolute: 0.1 10*3/uL (ref 0.0–0.1)
Basophils Relative: 1 %
Eosinophils Absolute: 0.2 10*3/uL (ref 0.0–0.5)
Eosinophils Relative: 2 %
HCT: 37.8 % (ref 36.0–46.0)
Hemoglobin: 12.1 g/dL (ref 12.0–15.0)
Immature Granulocytes: 1 %
Lymphocytes Relative: 20 %
Lymphs Abs: 2 10*3/uL (ref 0.7–4.0)
MCH: 28.3 pg (ref 26.0–34.0)
MCHC: 32 g/dL (ref 30.0–36.0)
MCV: 88.3 fL (ref 80.0–100.0)
Monocytes Absolute: 0.6 10*3/uL (ref 0.1–1.0)
Monocytes Relative: 6 %
Neutro Abs: 7 10*3/uL (ref 1.7–7.7)
Neutrophils Relative %: 70 %
Platelets: 197 10*3/uL (ref 150–400)
RBC: 4.28 MIL/uL (ref 3.87–5.11)
RDW: 15 % (ref 11.5–15.5)
WBC: 9.8 10*3/uL (ref 4.0–10.5)
nRBC: 0 % (ref 0.0–0.2)

## 2022-01-07 LAB — BASIC METABOLIC PANEL
Anion gap: 5 (ref 5–15)
BUN: 16 mg/dL (ref 6–20)
CO2: 26 mmol/L (ref 22–32)
Calcium: 9.4 mg/dL (ref 8.9–10.3)
Chloride: 113 mmol/L — ABNORMAL HIGH (ref 98–111)
Creatinine, Ser: 0.78 mg/dL (ref 0.44–1.00)
GFR, Estimated: >60 mL/min (ref >60)
Glucose, Bld: 115 mg/dL — ABNORMAL HIGH (ref 70–99)
Potassium: 3.7 mmol/L (ref 3.5–5.1)
Sodium: 144 mmol/L (ref 135–145)

## 2022-01-07 LAB — RESP PANEL BY RT-PCR (FLU A&B, COVID) ARPGX2
Influenza A by PCR: NEGATIVE
Influenza B by PCR: NEGATIVE
SARS Coronavirus 2 by RT PCR: NEGATIVE

## 2022-01-07 LAB — D-DIMER, QUANTITATIVE: D-Dimer, Quant: 1.95 ug/mL-FEU — ABNORMAL HIGH (ref 0.00–0.50)

## 2022-01-07 LAB — TROPONIN I (HIGH SENSITIVITY)
Troponin I (High Sensitivity): 2 ng/L (ref <18)
Troponin I (High Sensitivity): <2 ng/L (ref <18)

## 2022-01-07 MED ORDER — ACETAMINOPHEN-CODEINE 300-30 MG PO TABS: 1.0000 | ORAL_TABLET | Freq: Four times a day (QID) | ORAL | 0 refills | Status: DC | PRN

## 2022-01-07 MED ORDER — ACETAMINOPHEN 500 MG PO TABS
1000.0000 mg | ORAL_TABLET | Freq: Once | ORAL | Status: AC
  Administered 2022-01-07: 1000 mg via ORAL
  Filled 2022-01-07: qty 2

## 2022-01-07 MED ORDER — ALBUTEROL SULFATE HFA 108 (90 BASE) MCG/ACT IN AERS: 2.0000 | INHALATION_SPRAY | RESPIRATORY_TRACT | Status: DC | PRN

## 2022-01-07 MED ORDER — BENZONATATE 100 MG PO CAPS: 100.0000 mg | ORAL_CAPSULE | Freq: Three times a day (TID) | ORAL | 0 refills | Status: DC

## 2022-01-07 MED ORDER — LIDOCAINE 5 % EX PTCH: 1.0000 | MEDICATED_PATCH | CUTANEOUS | 0 refills | Status: DC

## 2022-01-07 MED ORDER — ALUM & MAG HYDROXIDE-SIMETH 200-200-20 MG/5ML PO SUSP
30.0000 mL | Freq: Once | ORAL | Status: AC
  Administered 2022-01-07: 30 mL via ORAL
  Filled 2022-01-07: qty 30

## 2022-01-07 MED ORDER — LIDOCAINE VISCOUS HCL 2 % MT SOLN
15.0000 mL | Freq: Once | OROMUCOSAL | Status: AC
  Administered 2022-01-07: 15 mL via ORAL
  Filled 2022-01-07: qty 15

## 2022-01-07 MED ORDER — IOHEXOL 350 MG/ML SOLN
100.0000 mL | Freq: Once | INTRAVENOUS | Status: AC | PRN
  Administered 2022-01-07: 100 mL via INTRAVENOUS

## 2022-01-07 NOTE — Discharge Instructions (Addendum)
Please follow-up with your PCP regarding this findings. No evidence of PE although contrast bolus timing was not perfect. We will treat for acute bronchitis. Please return to the ED for worsening symptoms.   Your CT findings: IMPRESSION:  1. No acute pulmonary embolism is seen. Evaluation of the segmental  and more distal vessels is limited by contrast bolus timing and  respiratory/cardiac motion artifact.  2. No acute lung process.  3. Chronic stability of posterior right seventh rib cortical and  trabecular thickening, again possibly Paget's disease.    Aortic Atherosclerosis (ICD10-I70.0).

## 2022-01-07 NOTE — ED Triage Notes (Signed)
Pt reports cough, SHOB since this morning.

## 2022-01-07 NOTE — ED Provider Notes (Signed)
[facility] DE[facility] Provider Note   CSN: [id number] Arrival date & time: 01/07/22  0805     History  Chief Complaint  Patient presents with   Cough   Shortness of Breath    Wanda [name] is a 56 y.o. female.   Cough Associated symptoms: shortness of breath   Shortness of Breath Associated symptoms: cough     56 year old female with medical history significant for depression, HLD, CVA, GERD, HTN, chronic bronchitis/asthma, anemia, fibromyalgia, OSA, DM 2 who presents to the emergency department with cute onset shortness of breath.  The patient states that she woke up this morning around 3 AM with acute onset shortness of breath and sharp pleuritic chest pain that was substernal.  She did endorse a burning component and thought initially it might be reflux.  She denies a history of PE or DVT.  She states that she has had back pain in her mid back for the past few weeks and had had a negative work-up outpatient.  She had not had CTA PE imaging.  She endorses a new cough.  She denies any lower extremity swelling.  She denies any fevers or chills.  She continues to endorse a nonproductive cough with intermittent sharp pleuritic chest pain with intermittent back pain.  She states that she is status post cholecystectomy.  She has a history of asthma and and took a few puffs of albuterol this morning.  She denies any active wheezing.  Home Medications Prior to Admission medications   Medication Sig Start Date End Date Taking? Authorizing Provider  lidocaine (LIDODERM) 5 % Place 1 patch onto the skin daily. Remove & Discard patch within 12 hours or as directed by MD 01/07/22  Yes Regan Lemming, MD  acetaminophen-codeine (TYLENOL #3) 300-30 MG tablet Take 1-2 tablets by mouth every 6 (six) hours as needed for moderate pain. 01/07/22   Regan Lemming, MD  albuterol (PROVENTIL) (2.5 MG/3ML) 0.083% nebulizer solution Take 2.5 mg by nebulization every 6 (six) hours  as needed for wheezing or shortness of breath.    [provider]  albuterol (VENTOLIN HFA) 108 (90 Base) MCG/ACT inhaler Inhale 2 puffs into the lungs every 6 (six) hours as needed for wheezing or shortness of breath.    [provider]  aspirin EC 81 MG tablet Take 1 tablet (81 mg total) by mouth 2 (two) times daily. Patient taking differently: Take 81 mg by mouth daily. One a day 06/27/18   Leighton Parody, PA-C  benzonatate (TESSALON) 100 MG capsule Take 1 capsule (100 mg total) by mouth every 8 (eight) hours. 01/07/22   Regan Lemming, MD  bisoprolol (ZEBETA) 5 MG tablet TAKE 1 TABLET(5 MG) BY MOUTH DAILY 01/11/19   Tanda Rockers, MD  busPIRone (BUSPAR) 15 MG tablet Take 15 mg by mouth 2 (two) times daily.     [provider]  Cholecalciferol (VITAMIN D3) 125 MCG (5000 UT) CAPS Take 10,000 Units by mouth every Monday, Tuesday, Wednesday, Thursday, and Friday.    [provider]  gabapentin (NEURONTIN) 100 MG capsule Take 300 mg by mouth in the morning, at noon, and at bedtime. 12/01/19   [provider]  Galcanezumab-gnlm [facility]) 120 MG/ML SOAJ INJECT ONE ML UNDER THE SKIN every 30 DAYS 01/05/22   Pieter Partridge, DO  hydrochlorothiazide (MICROZIDE) 12.5 MG capsule Take 12.5 mg by mouth in the morning.    [provider]  Ketorolac Tromethamine (SPRIX) 15.75 MG/SPRAY SOLN Place 1 spray  into the nose every 6 (six) hours as needed. Max daily dose of 4 doses 03/11/21   Tomi Likens, Adam R, DO  ketotifen (ZADITOR) 0.025 % ophthalmic solution 1 drop every 12 (twelve) hours as needed (watery eyes/itching eyes).    [provider]  LATUDA 20 MG TABS tablet Take 20 mg by mouth at bedtime. 12/01/19   [provider]  levocetirizine (XYZAL) 5 MG tablet Take 5 mg by mouth at bedtime.    [provider]  metFORMIN (GLUCOPHAGE-XR) 500 MG 24 hr tablet Take 500 mg by mouth every evening. 04/17/21   [provider]  mometasone  (NASONEX) 50 MCG/ACT nasal spray Place 2 sprays into the nose daily.    [provider]  montelukast (SINGULAIR) 10 MG tablet Take 10 mg by mouth at bedtime.    [provider]  pantoprazole (PROTONIX) 40 MG tablet Take 40 mg by mouth 2 (two) times daily before a meal.     [provider]  rosuvastatin (CRESTOR) 20 MG tablet Take 20 mg by mouth at bedtime. 04/17/21   [provider]  sertraline (ZOLOFT) 100 MG tablet Take 100-200 mg by mouth See admin instructions. Take 1 tablet (100 mg) by mouth in the morning at breakfast & take 2 tablets (200 mg) by mouth at bedtime.    [provider]  tiZANidine (ZANAFLEX) 4 MG tablet TAKE TWO TABLETS BY MOUTH EVERYDAY AT BEDTIME 11/07/21   Jaffe, Adam R, DO  TROKENDI XR 100 MG CP24 TAKE ONE CAPSULE BY MOUTH EVERYDAY AT BEDTIME 07/14/21   Jaffe, Adam R, DO  TROKENDI XR 50 MG CP24 TAKE ONE CAPSULE BY MOUTH EVERYDAY AT BEDTIME 07/14/21   Tomi Likens, Adam R, DO      Allergies    Lisinopril, Latex, and Tape    Review of Systems   Review of Systems  Respiratory:  Positive for cough and shortness of breath.   All other systems reviewed and are negative.   Physical Exam Updated Vital Signs BP 132/68   Pulse 76   Temp 98.7 F (37.1 C) (Oral)   Resp (!) 21   LMP 10/14/2002 (Within Weeks)   SpO2 98%  Physical Exam Vitals and nursing note reviewed.  Constitutional:      General: She is not in acute distress.    Appearance: She is well-developed. She is obese.  HENT:     Head: Normocephalic and atraumatic.  Eyes:     Conjunctiva/sclera: Conjunctivae normal.  Cardiovascular:     Rate and Rhythm: Normal rate and regular rhythm.     Heart sounds: No murmur heard. Pulmonary:     Effort: Pulmonary effort is normal. No respiratory distress.     Breath sounds: Normal breath sounds.  Abdominal:     Palpations: Abdomen is soft.     Tenderness: There is no abdominal tenderness.  Musculoskeletal:        General: No  swelling.     Cervical back: Neck supple.  Skin:    General: Skin is warm and dry.     Capillary Refill: Capillary refill takes less than 2 seconds.  Neurological:     Mental Status: She is alert.  Psychiatric:        Mood and Affect: Mood normal.     ED Results / Procedures / Treatments   Labs (all labs ordered are listed, but only abnormal results are displayed) Labs Reviewed  BASIC METABOLIC PANEL - Abnormal; Notable for the following components:  Result Value   Chloride 113 (*)    Glucose, Bld 115 (*)    All other components within normal limits  D-DIMER, QUANTITATIVE - Abnormal; Notable for the following components:   D-Dimer, Quant 1.95 (*)    All other components within normal limits  RESP PANEL BY RT-PCR (FLU A&B, COVID) [id number]  CBC WITH DIFFERENTIAL/PLATELET  TROPONIN I (HIGH SENSITIVITY)  TROPONIN I (HIGH SENSITIVITY)    EKG EKG Interpretation  Date/Time:  Wednesday January 07 2022 08:17:30 EDT Ventricular Rate:  79 PR Interval:  159 QRS Duration: 90 QT Interval:  375 QTC Calculation: 430 R Axis:   51 Text Interpretation: Sinus rhythm Borderline T wave abnormalities No significant change since prior 11/22 Confirmed by Aletta Edouard [phone]) on 01/07/2022 8:34:40 AM  Radiology CT Angio Chest PE W and/or Wo Contrast  Result Date: 01/07/2022 CLINICAL DATA:  Pulmonary embolism suspected. Positive D-dimer. Cough and shortness of breath since this morning. EXAM: CT ANGIOGRAPHY CHEST WITH CONTRAST TECHNIQUE: Multidetector CT imaging of the chest was performed using the standard protocol during bolus administration of intravenous contrast. Multiplanar CT image reconstructions and MIPs were obtained to evaluate the vascular anatomy. RADIATION DOSE REDUCTION: This exam was performed according to the departmental dose-optimization program which includes automated exposure control, adjustment of the mA and/or kV according to patient size and/or use of iterative  reconstruction technique. CONTRAST:  136m OMNIPAQUE IOHEXOL 350 MG/ML SOLN COMPARISON:  Chest two views 01/07/2022 and 01/31/2020; CT chest without contrast 11/19/2021; CT chest with contrast 12/25/2015 FINDINGS: Cardiovascular: The main pulmonary artery is opacified up to 191 Hounsfield units. No filling defect is seen to indicate an acute pulmonary embolism. Evaluation of the segmental and more distal vessels is limited by contrast bolus timing and respiratory/cardiac motion artifact. Heart size is at the upper limits of normal. No pericardial effusion. No thoracic aortic aneurysm. Mild atherosclerotic calcifications within the thoracic aorta. Normal caliber of the main pulmonary artery. Mediastinum/Nodes: The visualized thyroid is unremarkable. Tiny sliding hiatal hernia is suspected. No axillary, mediastinal or hilar pathologically enlarged lymph nodes by CT criteria. Lungs/Pleura: Minimal curvilinear posterior lingular subsegmental atelectasis versus scarring. No pleural effusion. Upper Abdomen: Status post cholecystectomy. Partially visualized single distal transverse colon inferiorly directed diverticulum is again seen. Musculoskeletal: Mild dextrocurvature of the mid to lower thoracic spine. Moderate multilevel disc space narrowing endplate sclerosis and peripheral osteophytes with high-grade bridging osteophytes seen at the T5 through T11 levels. There is again moderate cortical thickening and trabecular thickening of the entire posterior aspect of the right seventh rib, no significant change multiple prior CTs including 12/25/2015. This again may reflect Paget's disease. Review of the MIP images confirms the above findings. IMPRESSION: 1. No acute pulmonary embolism is seen. Evaluation of the segmental and more distal vessels is limited by contrast bolus timing and respiratory/cardiac motion artifact. 2. No acute lung process. 3. Chronic stability of posterior right seventh rib cortical and trabecular  thickening, again possibly Paget's disease. Aortic Atherosclerosis (ICD10-I70.0). Electronically Signed   By: RYvonne KendallM.D.   On: 01/07/2022 12:23   DG Chest 2 View  Result Date: 01/07/2022 CLINICAL DATA:  Short of breath EXAM: CHEST - 2 VIEW COMPARISON:  July 2021 FINDINGS: Cardiomediastinal contours are within normal limits. No new consolidation or edema. No pleural effusion or pneumothorax. No acute osseous abnormality. IMPRESSION: No acute process in the chest. Electronically Signed   By: PMacy MisM.D.   On: 01/07/2022 09:52    Procedures Procedures    Medications  Ordered in ED Medications  alum & mag hydroxide-simeth (MAALOX/MYLANTA) 200-200-20 MG/5ML suspension 30 mL (30 mLs Oral Given 01/07/22 0941)    And  lidocaine (XYLOCAINE) 2 % viscous mouth solution 15 mL (15 mLs Oral Given 01/07/22 0941)  iohexol (OMNIPAQUE) 350 MG/ML injection 100 mL (100 mLs Intravenous Contrast Given 01/07/22 1153)  acetaminophen (TYLENOL) tablet 1,000 mg (1,000 mg Oral Given 01/07/22 1412)    ED Course/ Medical Decision Making/ A&P Clinical Course as of 01/09/22 1610  Wed Jan 07, 2022  1036 D-Dimer, Quant(!): 1.95 [JL]    Clinical Course User Index [JL] Regan Lemming, MD                           Medical Decision Making Amount and/or Complexity of Data Reviewed Labs: ordered. Decision-making details documented in ED Course. Radiology: ordered.  Risk OTC drugs. Prescription drug management.     56 year old female with medical history significant for depression, HLD, CVA, GERD, HTN, chronic bronchitis/asthma, anemia, fibromyalgia, OSA, DM 2 who presents to the emergency department with cute onset shortness of breath.  The patient states that she woke up this morning around 3 AM with acute onset shortness of breath and sharp pleuritic chest pain that was substernal.  She did endorse a burning component and thought initially it might be reflux.  She denies a history of PE or DVT.  She  states that she has had back pain in her mid back for the past few weeks and had had a negative work-up outpatient.  She had not had CTA PE imaging.  She endorses a new cough.  She denies any lower extremity swelling.  She denies any fevers or chills.  She continues to endorse a nonproductive cough with intermittent sharp pleuritic chest pain with intermittent back pain.  She states that she is status post cholecystectomy.  She has a history of asthma and and took a few puffs of albuterol this morning.  She denies any active wheezing.  On arrival, the patient was afebrile, hemodynamically stable, not tachycardic or tachypneic, mildly hypertensive BP 146/100, saturating 96% on room air.  Physical exam significant for lungs were clear to auscultation bilaterally with no wheezing, rales or rhonchi.  No lower extremity edema.  Patient is uncomfortable and continues to endorse sharp pleuritic chest pain.  Differential diagnosis includes PE, GERD, myocarditis/pericarditis, pneumothorax, pneumonia.  Normal sinus rhythm noted on cardiac telemetry.  Given the patient's history of pulm embolism, not currently on anticoagulation, my primary concern is for recurrent PE.   EKG revealed sinus rhythm, ventricular rate 79, borderline T wave abnormalities, no STEMI.  screening laboratory work-up initiated significant for an elevated D-dimer to 1.95, troponins negative x2, COVID-19 influenza PCR testing negative, CBC without leukocytosis or anemia, BMP without significant electrolyte abnormality, normal renal function.  Initial checks x-ray was reviewed by myself and interpreted by myself in addition to radiology negative for focal airspace disease.  Given the elevated D-dimer, CTA PE study was ordered.  Incomplete contrast bolus timing negative evaluation of the segmental and subsegmental pulmonary arteries limited.    IMPRESSION:  1. No acute pulmonary embolism is seen. Evaluation of the segmental  and more distal  vessels is limited by contrast bolus timing and  respiratory/cardiac motion artifact.  2. No acute lung process.  3. Chronic stability of posterior right seventh rib cortical and  trabecular thickening, again possibly Paget's disease.    Aortic Atherosclerosis (ICD10-I70.0).   The patient is  currently not tachycardic, and saturating well on room air.  The patient had CT findings concerning for Paget's disease of the bone that with a stable posterior right seventh rib cortical and trabecular thickening.  Discussed with the patient the concern for possible PE that could be small resulting in the patient's symptoms.  Discussed risks and benefits of anticoagulation.  Will defer anticoagulation at this time no clear evidence of PE on CT imaging.  Will treat for acute bronchitis at this time.  Discussed signs and symptoms concerning for development of pulmonary embolism, need for return to the emergency department.  Reevaluation.  Return precautions provided.   Final Clinical Impression(s) / ED Diagnoses Final diagnoses:  Acute cough  Acute left-sided thoracic back pain  Paget's disease of the bone  Bronchitis    Rx / DC Orders ED Discharge Orders          Ordered    acetaminophen-codeine (TYLENOL #3) 300-30 MG tablet  Every 6 hours PRN,   Status:  Discontinued        01/07/22 1438    benzonatate (TESSALON) 100 MG capsule  Every 8 hours,   Status:  Discontinued        01/07/22 1438    acetaminophen-codeine (TYLENOL #3) 300-30 MG tablet  Every 6 hours PRN        01/07/22 1448    benzonatate (TESSALON) 100 MG capsule  Every 8 hours        01/07/22 1448    lidocaine (LIDODERM) 5 %  Every 24 hours        01/07/22 1448              Regan Lemming, MD 01/09/22 [phone]

## 2022-01-07 NOTE — ED Notes (Signed)
Urine and culture sent to lab  

## 2022-01-15 ENCOUNTER — Ambulatory Visit
Admission: RE | Admit: 2022-01-15 | Discharge: 2022-01-15 | Disposition: A | Discharge status: Home / Self Care | Payer: Medicare Other | Source: Ambulatory Visit | Attending: Neurology | Admitting: Neurology

## 2022-01-15 DIAGNOSIS — G43119 Migraine with aura, intractable, without status migrainosus: Secondary | ICD-10-CM | POA: Diagnosis not present

## 2022-01-15 DIAGNOSIS — I10 Essential (primary) hypertension: Secondary | ICD-10-CM | POA: Diagnosis not present

## 2022-01-15 DIAGNOSIS — E1169 Type 2 diabetes mellitus with other specified complication: Secondary | ICD-10-CM | POA: Diagnosis not present

## 2022-01-15 DIAGNOSIS — R42 Dizziness and giddiness: Secondary | ICD-10-CM | POA: Diagnosis not present

## 2022-01-15 DIAGNOSIS — M797 Fibromyalgia: Secondary | ICD-10-CM | POA: Diagnosis not present

## 2022-01-15 DIAGNOSIS — C50011 Malignant neoplasm of nipple and areola, right female breast: Secondary | ICD-10-CM | POA: Diagnosis not present

## 2022-01-20 DIAGNOSIS — M4807 Spinal stenosis, lumbosacral region: Secondary | ICD-10-CM | POA: Diagnosis not present

## 2022-02-02 ENCOUNTER — Other Ambulatory Visit: Payer: Self-pay | Admitting: Neurology

## 2022-02-09 ENCOUNTER — Ambulatory Visit: Payer: Medicare Other | Admitting: Neurology

## 2022-02-09 DIAGNOSIS — G4733 Obstructive sleep apnea (adult) (pediatric): Secondary | ICD-10-CM | POA: Diagnosis not present

## 2022-02-19 DIAGNOSIS — M5451 Vertebrogenic low back pain: Secondary | ICD-10-CM | POA: Diagnosis not present

## 2022-03-12 DIAGNOSIS — G4733 Obstructive sleep apnea (adult) (pediatric): Secondary | ICD-10-CM | POA: Diagnosis not present

## 2022-03-12 DIAGNOSIS — E1169 Type 2 diabetes mellitus with other specified complication: Secondary | ICD-10-CM | POA: Diagnosis not present

## 2022-03-12 DIAGNOSIS — I10 Essential (primary) hypertension: Secondary | ICD-10-CM | POA: Diagnosis not present

## 2022-03-16 DIAGNOSIS — G4733 Obstructive sleep apnea (adult) (pediatric): Secondary | ICD-10-CM | POA: Diagnosis not present

## 2022-03-26 DIAGNOSIS — I10 Essential (primary) hypertension: Secondary | ICD-10-CM | POA: Diagnosis not present

## 2022-03-26 DIAGNOSIS — E1169 Type 2 diabetes mellitus with other specified complication: Secondary | ICD-10-CM | POA: Diagnosis not present

## 2022-03-27 DIAGNOSIS — H00014 Hordeolum externum left upper eyelid: Secondary | ICD-10-CM | POA: Diagnosis not present

## 2022-03-31 DIAGNOSIS — H00011 Hordeolum externum right upper eyelid: Secondary | ICD-10-CM | POA: Diagnosis not present

## 2022-04-09 DIAGNOSIS — I10 Essential (primary) hypertension: Secondary | ICD-10-CM | POA: Diagnosis not present

## 2022-04-09 DIAGNOSIS — E1169 Type 2 diabetes mellitus with other specified complication: Secondary | ICD-10-CM | POA: Diagnosis not present

## 2022-04-13 ENCOUNTER — Other Ambulatory Visit: Payer: Self-pay | Admitting: Obstetrics and Gynecology

## 2022-04-13 DIAGNOSIS — Z1231 Encounter for screening mammogram for malignant neoplasm of breast: Secondary | ICD-10-CM

## 2022-04-21 DIAGNOSIS — M4807 Spinal stenosis, lumbosacral region: Secondary | ICD-10-CM | POA: Diagnosis not present

## 2022-04-28 ENCOUNTER — Telehealth: Payer: Self-pay | Admitting: Neurology

## 2022-04-28 DIAGNOSIS — G43409 Hemiplegic migraine, not intractable, without status migrainosus: Secondary | ICD-10-CM

## 2022-04-28 MED ORDER — KETOROLAC TROMETHAMINE 15.75 MG/SPRAY NA SOLN: NASAL | 2 refills | Status: DC

## 2022-04-28 NOTE — Telephone Encounter (Signed)
Pt said her pharmacy has faxed a refill on her nasal spray sprix and they have not heard from anyone.

## 2022-05-01 ENCOUNTER — Ambulatory Visit
Admission: RE | Admit: 2022-05-01 | Discharge: 2022-05-01 | Disposition: A | Discharge status: Home / Self Care | Payer: Medicare Other | Source: Ambulatory Visit

## 2022-05-01 DIAGNOSIS — Z1231 Encounter for screening mammogram for malignant neoplasm of breast: Secondary | ICD-10-CM

## 2022-05-04 DIAGNOSIS — Z9189 Other specified personal risk factors, not elsewhere classified: Secondary | ICD-10-CM | POA: Diagnosis not present

## 2022-05-06 DIAGNOSIS — I10 Essential (primary) hypertension: Secondary | ICD-10-CM | POA: Diagnosis not present

## 2022-05-06 DIAGNOSIS — E1169 Type 2 diabetes mellitus with other specified complication: Secondary | ICD-10-CM | POA: Diagnosis not present

## 2022-05-13 DIAGNOSIS — E119 Type 2 diabetes mellitus without complications: Secondary | ICD-10-CM | POA: Diagnosis not present

## 2022-05-14 DIAGNOSIS — G8929 Other chronic pain: Secondary | ICD-10-CM | POA: Diagnosis not present

## 2022-05-14 DIAGNOSIS — K219 Gastro-esophageal reflux disease without esophagitis: Secondary | ICD-10-CM | POA: Diagnosis not present

## 2022-05-14 DIAGNOSIS — M150 Primary generalized (osteo)arthritis: Secondary | ICD-10-CM | POA: Diagnosis not present

## 2022-05-14 DIAGNOSIS — E1169 Type 2 diabetes mellitus with other specified complication: Secondary | ICD-10-CM | POA: Diagnosis not present

## 2022-05-14 DIAGNOSIS — I10 Essential (primary) hypertension: Secondary | ICD-10-CM | POA: Diagnosis not present

## 2022-05-14 DIAGNOSIS — E7800 Pure hypercholesterolemia, unspecified: Secondary | ICD-10-CM | POA: Diagnosis not present

## 2022-05-25 DIAGNOSIS — I10 Essential (primary) hypertension: Secondary | ICD-10-CM | POA: Diagnosis not present

## 2022-05-25 DIAGNOSIS — E1169 Type 2 diabetes mellitus with other specified complication: Secondary | ICD-10-CM | POA: Diagnosis not present

## 2022-06-09 DIAGNOSIS — E1169 Type 2 diabetes mellitus with other specified complication: Secondary | ICD-10-CM | POA: Diagnosis not present

## 2022-06-09 DIAGNOSIS — I10 Essential (primary) hypertension: Secondary | ICD-10-CM | POA: Diagnosis not present

## 2022-06-14 DIAGNOSIS — G4733 Obstructive sleep apnea (adult) (pediatric): Secondary | ICD-10-CM | POA: Diagnosis not present

## 2022-06-24 ENCOUNTER — Encounter: Payer: Self-pay | Admitting: Hematology

## 2022-06-24 ENCOUNTER — Other Ambulatory Visit: Payer: Self-pay

## 2022-06-24 MED ORDER — OZEMPIC (1 MG/DOSE) 4 MG/3ML ~~LOC~~ SOPN
1.0000 mg | PEN_INJECTOR | SUBCUTANEOUS | 1 refills | Status: DC
  Filled 2022-06-24: qty 3, 28d supply, fill #0

## 2022-07-02 ENCOUNTER — Ambulatory Visit: Payer: Medicare Other | Admitting: Neurology

## 2022-07-05 ENCOUNTER — Other Ambulatory Visit: Payer: Self-pay | Admitting: Neurology

## 2022-07-13 DIAGNOSIS — R051 Acute cough: Secondary | ICD-10-CM | POA: Diagnosis not present

## 2022-07-13 DIAGNOSIS — Z20822 Contact with and (suspected) exposure to covid-19: Secondary | ICD-10-CM | POA: Diagnosis not present

## 2022-07-13 DIAGNOSIS — R509 Fever, unspecified: Secondary | ICD-10-CM | POA: Diagnosis not present

## 2022-07-23 DIAGNOSIS — E65 Localized adiposity: Secondary | ICD-10-CM | POA: Diagnosis not present

## 2022-07-23 DIAGNOSIS — I10 Essential (primary) hypertension: Secondary | ICD-10-CM | POA: Diagnosis not present

## 2022-07-23 DIAGNOSIS — E1169 Type 2 diabetes mellitus with other specified complication: Secondary | ICD-10-CM | POA: Diagnosis not present

## 2022-07-24 DIAGNOSIS — H6692 Otitis media, unspecified, left ear: Secondary | ICD-10-CM | POA: Diagnosis not present

## 2022-07-24 DIAGNOSIS — J0100 Acute maxillary sinusitis, unspecified: Secondary | ICD-10-CM | POA: Diagnosis not present

## 2022-08-04 ENCOUNTER — Other Ambulatory Visit: Payer: Self-pay | Admitting: Neurology

## 2022-08-06 NOTE — Progress Notes (Signed)
Virtual Visit via Video Note  Consent was obtained for video visit:  Yes.   Answered questions that patient had about telehealth interaction:  Yes.   I discussed the limitations, risks, security and privacy concerns of performing an evaluation and management service by telemedicine. I also discussed with the patient that there may be a patient responsible charge related to this service. The patient expressed understanding and agreed to proceed.  Pt location: Home Physician Location: office Name of referring provider:  Marda Stalker, PA-C I connected with Festus Barren at patients initiation/request on 08/10/2022 at 10:30 AM EST by video enabled telemedicine application and verified that I am speaking with the correct person using two identifiers. Pt MRN:  [id number] Pt DOB:  [birth year] Video Participants:  Festus Barren   Assessment/Plan:   Migraine with aura, without status migrainosus, not intractable Hemiplegic migraine Vertigo- vestibular migraine vs BPPV however given severity and prior history of stroke-like spells, will check imaging.   Migraine prevention:  Emgality, Trokendi XR '150mg'$  daily Migraine rescue:  Sprix NS.  Due to side effects of Sprix, she will come by office to pick up samples of Nurtec and Ubrelvy to try. Limit use of pain relievers to no more than 2 days out of week to prevent risk of rebound or medication-overuse headache. Keep headache diary Follow up 9 months  Subjective:  Wanda [name] is a 57 year old right-handed woman with Paget's disease, osteoarthritis, fibromyalgia, GERD, IBS, hyperlipidemia, carotid artery disease and bilateral knee replacements who follows up for migraine and hemiplegic migraine.  Due to not feeling well, changed appointment to virtual visit.   UPDATE:  Flu - had awful headache -  Intensity:  moderate-severe Duration:  Usually no more than an hour with Sprix.  Sprix effective but it causes burning and  nausea.   Frequency:  1 a month Frequency of abortive medication: 1 day a week   On Memorial Day, she had another episode of severe vertigo.  MRI and MRA of brain on 01/15/2022 personally reviewed were unremarkable.       Rescue:  Sprix NS with Benadryl Current NSAIDS: ASA '81mg'$ ; Sprix nasal spray Current analgesics: Hydrocodone (for pain related to Paget's) Current triptans: None Current ergotamine: None Current anti-emetic: Zofran 8 mg Current muscle relaxants: Tizanidine 8 mg at bedtime (to help with sleep) Current anti-anxiolytic: BuSpar Current sleep aide:  Latuda Current Antihypertensive medications: Zepeta Current Antidepressant medications: Sertraline 300 mg Current Anticonvulsant medications: Trokendi XR 150 mg Current anti-CGRP:  Emgality Current Vitamins/Herbal/Supplements: Ferrous sulfate Current Antihistamines/Decongestants: Benadryl Other therapy: Dry needling Hormone: None     Caffeine: No Alcohol: No Smoker: No Diet: Hydrates, no fried foods or dairy, no sweets or soda.  Working with health and wellness clinic to lose weight.  Exercise: Tries to walk.  Just had back surgery. Depression/Anxiety:   Other pain: Fibromyalgia.  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  Recovering well. Sleep hygiene: Started a CPAP for OSA - sleep much improved.   HISTORY: On 10/28/11, she was hospitalized for severe left-sided headache associated with slurred speech, left facial droop and left sided arm and leg weakness and numbness.  Headache lasted a day and weakness lasted a few days but she reports mild residual weakness.  MRI and MRA of head were personally reviewed and were negative for acute stroke.  Carotid doppler showed 40-59% bilateral ICA stenosis with antegrade flow in the vertebral arteries.  Echo showed EF 60% with  no PFO or ASD.  She was diagnosed with TIA and started on ASA.  She has subsequently had several other similar  episodes.  CT head and MRI brain from 06/02/12, CT head and MRI of brain from 10/28/12, CT head and MRI brain from 06/02/15, and MRI brain from 10/03/15 were all personally reviewed and were unremarkable.  MRI/MRA/MRV of head from 02/28/13 were unremarkable.  Carotid doppler from 06/26/16 showed minor carotid atherosclerosis but no hemodynamically significant stenosis.   She has left sided throbbing headache/left retro-orbital without the weakness as well.  It is severe and associated with nausea and photophobia.  Sometimes preceded by a visual aura of either spotty vision or a yellow strip in her visual field. They last 1 to 2 days. They occur 2 to 3 days per week.  She has 25 headache days per month. Triggers:  Dairy, cigarette smoke Relieving factors:  rest Activity:  Cannot function with severe headache.   She had a hemiplegic migraine on 10/11/17 with left sided  Facial weakness as well as numbness and tingling of the left face, arm and leg.  She went to the ED as a stroke code.  MRI of brain was normal.  CT perfusion was negative.  CTA of head and neck showed mild right ICA origin soft and calcified plaque but no intracranial or extracranial large vessel stenosis or occlusion.   Past NSAIDS:  Naproxen (GI upset), ibuprofen, Cambia (GI upset), Sprix NS (effective but no longer available) Past analgesics:  Excedrin, Tylenol, Percocet (hives), Norco, tramadol (hot flashes) Past abortive triptans:  Relpax Past muscle relaxants: no Past anti-emetic:  Phenergan '25mg'$ , Thorazine (effective for migraine abortive therapy but exacerbated hemiplegic migraines) Past antihypertensive medications:  verapamil 24hr '180mg'$ , HCTZ, lisinopril, atenolol '50mg'$  Past antidepressant medications:  nortriptyline '50mg'$  Past anticonvulsant medications:  Depakote, zonisamide, topiramate '100mg'$  twice daily (effective but caused memory problems and paresthesias), gabapentin '300mg'$  three times daily Past anti-CGRP:  None Past  vitamins/Herbal/Supplements:  no Other past therapy:  Botox    Family history of headache:  mother  Past Medical History: Past Medical History:  Diagnosis Date   Acne    Anemia    Anxiety    Asthma    Chronic bronchitis ([place])    Depression    Diabetes mellitus without complication ([place]) 6387   type 2   Family history of adverse reaction to anesthesia    pt's mother has hx. of post-op N/V   Fibromyalgia    GERD (gastroesophageal reflux disease)    History of blood transfusion    History of TIA (transient ischemic attack)    x 2; left-sided weakness   Hypercholesterolemia    no current med.   Hypertension    states under control with meds., has been on med. since 06/2011   Keloid 56/4332   umbilical   Migraines    Osteoarthritis    Osteoarthritis    Paget disease of bone    PONV (postoperative nausea and vomiting)    states severe   Seasonal allergies    Sleep apnea 2021   Stroke ([place]) 10/27/2012   TIA   1- 10/29/11-weakness left side    Medications: Outpatient Encounter Medications as of 08/10/2022  Medication Sig   acetaminophen-codeine (TYLENOL #3) 300-30 MG tablet Take 1-2 tablets by mouth every 6 (six) hours as needed for moderate pain.   albuterol (PROVENTIL) (2.5 MG/3ML) 0.083% nebulizer solution Take 2.5 mg by nebulization every 6 (six) hours as needed for wheezing or shortness of  breath.   albuterol (VENTOLIN HFA) 108 (90 Base) MCG/ACT inhaler Inhale 2 puffs into the lungs every 6 (six) hours as needed for wheezing or shortness of breath.   aspirin EC 81 MG tablet Take 1 tablet (81 mg total) by mouth 2 (two) times daily. (Patient taking differently: Take 81 mg by mouth daily. One a day)   benzonatate (TESSALON) 100 MG capsule Take 1 capsule (100 mg total) by mouth every 8 (eight) hours.   bisoprolol (ZEBETA) 5 MG tablet TAKE 1 TABLET(5 MG) BY MOUTH DAILY   busPIRone (BUSPAR) 15 MG tablet Take 15 mg by mouth 2 (two) times daily.    Cholecalciferol (VITAMIN D3)  125 MCG (5000 UT) CAPS Take 10,000 Units by mouth every Monday, Tuesday, Wednesday, Thursday, and Friday.   gabapentin (NEURONTIN) 100 MG capsule Take 300 mg by mouth in the morning, at noon, and at bedtime.   Galcanezumab-gnlm (EMGALITY) 120 MG/ML SOAJ INJECT 17m UNDER THE SKIN every 30 DAYS   hydrochlorothiazide (MICROZIDE) 12.5 MG capsule Take 12.5 mg by mouth in the morning.   Ketorolac Tromethamine (SPRIX) 15.75 MG/SPRAY SOLN Place 1 spray into the nose every 6 (six) hours as needed. Max daily dose of 4 doses   ketotifen (ZADITOR) 0.025 % ophthalmic solution 1 drop every 12 (twelve) hours as needed (watery eyes/itching eyes).   LATUDA 20 MG TABS tablet Take 20 mg by mouth at bedtime.   levocetirizine (XYZAL) 5 MG tablet Take 5 mg by mouth at bedtime.   lidocaine (LIDODERM) 5 % Place 1 patch onto the skin daily. Remove & Discard patch within 12 hours or as directed by MD   metFORMIN (GLUCOPHAGE-XR) 500 MG 24 hr tablet Take 500 mg by mouth every evening.   mometasone (NASONEX) 50 MCG/ACT nasal spray Place 2 sprays into the nose daily.   montelukast (SINGULAIR) 10 MG tablet Take 10 mg by mouth at bedtime.   pantoprazole (PROTONIX) 40 MG tablet Take 40 mg by mouth 2 (two) times daily before a meal.    rosuvastatin (CRESTOR) 20 MG tablet Take 20 mg by mouth at bedtime.   Semaglutide, 1 MG/DOSE, (OZEMPIC, 1 MG/DOSE,) 4 MG/3ML SOPN Inject 1 mg into the skin once a week.   sertraline (ZOLOFT) 100 MG tablet Take 100-200 mg by mouth See admin instructions. Take 1 tablet (100 mg) by mouth in the morning at breakfast & take 2 tablets (200 mg) by mouth at bedtime.   tiZANidine (ZANAFLEX) 4 MG tablet TAKE TWO TABLETS BY MOUTH EVERYDAY AT BEDTIME   TROKENDI XR 100 MG CP24 TAKE ONE CAPSULE BY MOUTH EVERYDAY AT BEDTIME   TROKENDI XR 50 MG CP24 TAKE ONE CAPSULE BY MOUTH EVERYDAY AT BEDTIME   No facility-administered encounter medications on file as of 08/10/2022.    Allergies: Allergies  Allergen  Reactions   Lisinopril Swelling and Other (See Comments)    SWELLING FACE/LIPS/NECK   Latex Rash    Skin peeling   Tape Rash    Skin tears/burns.    Family History: Family History  Problem Relation Age of Onset   Anesthesia problems Mother        post-op N/V   Anxiety disorder Father    Depression Father    Cervical cancer Maternal Grandmother    Cancer Maternal Grandmother        mouth   Heart attack Maternal Grandmother 575  Hypertension Brother    Heart failure Brother        ICD    Observations/Objective:  No acute distress.  Alert and oriented.  Speech fluent and not dysarthric.  Language intact.     Follow Up Instructions:    -I discussed the assessment and treatment plan with the patient. The patient was provided an opportunity to ask questions and all were answered. The patient agreed with the plan and demonstrated an understanding of the instructions.   The patient was advised to call back or seek an in-person evaluation if the symptoms worsen or if the condition fails to improve as anticipated. Metta Clines, DO  CC: Marda Stalker, PA-C

## 2022-08-09 ENCOUNTER — Encounter: Payer: Self-pay | Admitting: Neurology

## 2022-08-10 ENCOUNTER — Telehealth (INDEPENDENT_AMBULATORY_CARE_PROVIDER_SITE_OTHER): Payer: Medicare Other | Admitting: Neurology

## 2022-08-10 ENCOUNTER — Encounter: Payer: Self-pay | Admitting: Neurology

## 2022-08-10 DIAGNOSIS — G43409 Hemiplegic migraine, not intractable, without status migrainosus: Secondary | ICD-10-CM

## 2022-08-10 DIAGNOSIS — G43109 Migraine with aura, not intractable, without status migrainosus: Secondary | ICD-10-CM | POA: Diagnosis not present

## 2022-08-13 DIAGNOSIS — Z0000 Encounter for general adult medical examination without abnormal findings: Secondary | ICD-10-CM | POA: Diagnosis not present

## 2022-08-13 DIAGNOSIS — Z23 Encounter for immunization: Secondary | ICD-10-CM | POA: Diagnosis not present

## 2022-08-17 DIAGNOSIS — E7800 Pure hypercholesterolemia, unspecified: Secondary | ICD-10-CM | POA: Diagnosis not present

## 2022-08-17 DIAGNOSIS — G43709 Chronic migraine without aura, not intractable, without status migrainosus: Secondary | ICD-10-CM | POA: Diagnosis not present

## 2022-08-17 DIAGNOSIS — I10 Essential (primary) hypertension: Secondary | ICD-10-CM | POA: Diagnosis not present

## 2022-08-17 DIAGNOSIS — D509 Iron deficiency anemia, unspecified: Secondary | ICD-10-CM | POA: Diagnosis not present

## 2022-08-17 DIAGNOSIS — E559 Vitamin D deficiency, unspecified: Secondary | ICD-10-CM | POA: Diagnosis not present

## 2022-08-17 DIAGNOSIS — E538 Deficiency of other specified B group vitamins: Secondary | ICD-10-CM | POA: Diagnosis not present

## 2022-08-17 DIAGNOSIS — E1169 Type 2 diabetes mellitus with other specified complication: Secondary | ICD-10-CM | POA: Diagnosis not present

## 2022-08-17 DIAGNOSIS — K219 Gastro-esophageal reflux disease without esophagitis: Secondary | ICD-10-CM | POA: Diagnosis not present

## 2022-08-17 DIAGNOSIS — M797 Fibromyalgia: Secondary | ICD-10-CM | POA: Diagnosis not present

## 2022-08-24 DIAGNOSIS — E1169 Type 2 diabetes mellitus with other specified complication: Secondary | ICD-10-CM | POA: Diagnosis not present

## 2022-08-24 DIAGNOSIS — I10 Essential (primary) hypertension: Secondary | ICD-10-CM | POA: Diagnosis not present

## 2022-08-24 DIAGNOSIS — E65 Localized adiposity: Secondary | ICD-10-CM | POA: Diagnosis not present

## 2022-08-26 DIAGNOSIS — E538 Deficiency of other specified B group vitamins: Secondary | ICD-10-CM | POA: Diagnosis not present

## 2022-09-02 DIAGNOSIS — R0789 Other chest pain: Secondary | ICD-10-CM | POA: Diagnosis not present

## 2022-09-04 ENCOUNTER — Other Ambulatory Visit: Payer: Self-pay | Admitting: Family Medicine

## 2022-09-04 DIAGNOSIS — N644 Mastodynia: Secondary | ICD-10-CM

## 2022-09-09 DIAGNOSIS — E538 Deficiency of other specified B group vitamins: Secondary | ICD-10-CM | POA: Diagnosis not present

## 2022-09-13 DIAGNOSIS — G4733 Obstructive sleep apnea (adult) (pediatric): Secondary | ICD-10-CM | POA: Diagnosis not present

## 2022-09-15 ENCOUNTER — Ambulatory Visit
Admission: RE | Admit: 2022-09-15 | Discharge: 2022-09-15 | Disposition: A | Discharge status: Home / Self Care | Payer: Medicare Other | Source: Ambulatory Visit | Attending: Family Medicine | Admitting: Family Medicine

## 2022-09-15 ENCOUNTER — Encounter: Payer: Self-pay | Admitting: Family Medicine

## 2022-09-15 ENCOUNTER — Other Ambulatory Visit: Payer: Self-pay | Admitting: Family Medicine

## 2022-09-15 DIAGNOSIS — N644 Mastodynia: Secondary | ICD-10-CM

## 2022-09-15 DIAGNOSIS — N6489 Other specified disorders of breast: Secondary | ICD-10-CM

## 2022-09-21 DIAGNOSIS — E65 Localized adiposity: Secondary | ICD-10-CM | POA: Diagnosis not present

## 2022-09-21 DIAGNOSIS — E1169 Type 2 diabetes mellitus with other specified complication: Secondary | ICD-10-CM | POA: Diagnosis not present

## 2022-09-21 DIAGNOSIS — I10 Essential (primary) hypertension: Secondary | ICD-10-CM | POA: Diagnosis not present

## 2022-09-22 ENCOUNTER — Ambulatory Visit
Admission: RE | Admit: 2022-09-22 | Discharge: 2022-09-22 | Disposition: A | Discharge status: Home / Self Care | Payer: Medicare Other | Source: Ambulatory Visit | Attending: Family Medicine | Admitting: Family Medicine

## 2022-09-22 DIAGNOSIS — N6489 Other specified disorders of breast: Secondary | ICD-10-CM

## 2022-09-22 DIAGNOSIS — N6011 Diffuse cystic mastopathy of right breast: Secondary | ICD-10-CM | POA: Diagnosis not present

## 2022-09-22 DIAGNOSIS — R928 Other abnormal and inconclusive findings on diagnostic imaging of breast: Secondary | ICD-10-CM | POA: Diagnosis not present

## 2022-09-22 HISTORY — PX: BREAST BIOPSY: SHX20

## 2022-09-24 DIAGNOSIS — E538 Deficiency of other specified B group vitamins: Secondary | ICD-10-CM | POA: Diagnosis not present

## 2022-10-07 DIAGNOSIS — E538 Deficiency of other specified B group vitamins: Secondary | ICD-10-CM | POA: Diagnosis not present

## 2022-10-14 DIAGNOSIS — E1169 Type 2 diabetes mellitus with other specified complication: Secondary | ICD-10-CM | POA: Diagnosis not present

## 2022-10-14 DIAGNOSIS — E65 Localized adiposity: Secondary | ICD-10-CM | POA: Diagnosis not present

## 2022-10-14 DIAGNOSIS — I10 Essential (primary) hypertension: Secondary | ICD-10-CM | POA: Diagnosis not present

## 2022-10-22 DIAGNOSIS — E538 Deficiency of other specified B group vitamins: Secondary | ICD-10-CM | POA: Diagnosis not present

## 2022-10-28 DIAGNOSIS — G4733 Obstructive sleep apnea (adult) (pediatric): Secondary | ICD-10-CM | POA: Diagnosis not present

## 2022-11-04 DIAGNOSIS — E538 Deficiency of other specified B group vitamins: Secondary | ICD-10-CM | POA: Diagnosis not present

## 2022-11-10 ENCOUNTER — Encounter: Payer: Self-pay | Admitting: Neurology

## 2022-11-10 ENCOUNTER — Other Ambulatory Visit: Payer: Self-pay | Admitting: Neurology

## 2022-11-10 MED ORDER — NURTEC 75 MG PO TBDP: 75.0000 mg | ORAL_TABLET | Freq: Every day | ORAL | 3 refills | Status: DC | PRN

## 2022-11-12 ENCOUNTER — Telehealth: Payer: Self-pay

## 2022-11-12 NOTE — Telephone Encounter (Signed)
PA needed for Nurtec 75 mg.  

## 2022-11-17 ENCOUNTER — Other Ambulatory Visit (HOSPITAL_COMMUNITY): Payer: Self-pay | Admitting: Orthopedic Surgery

## 2022-11-17 DIAGNOSIS — M25562 Pain in left knee: Secondary | ICD-10-CM | POA: Diagnosis not present

## 2022-11-17 DIAGNOSIS — Z96652 Presence of left artificial knee joint: Secondary | ICD-10-CM

## 2022-11-18 DIAGNOSIS — E1169 Type 2 diabetes mellitus with other specified complication: Secondary | ICD-10-CM | POA: Diagnosis not present

## 2022-11-18 DIAGNOSIS — E538 Deficiency of other specified B group vitamins: Secondary | ICD-10-CM | POA: Diagnosis not present

## 2022-11-18 DIAGNOSIS — I10 Essential (primary) hypertension: Secondary | ICD-10-CM | POA: Diagnosis not present

## 2022-11-23 DIAGNOSIS — M25562 Pain in left knee: Secondary | ICD-10-CM | POA: Diagnosis not present

## 2022-11-24 NOTE — Telephone Encounter (Signed)
F/u  Patient calling checking the status of Nurtec 75 mg.    Patient calling the office for samples of medication:   1.  What medication and dosage are you requesting samples for? Nurtec 75 mg   2.  Are you currently out of this medication?  Yes, status of prior authorization

## 2022-11-25 NOTE — Telephone Encounter (Signed)
Called and informed pt. Of Dr. Everlena Cooper answer and she will be by to pick up samples.

## 2022-11-26 ENCOUNTER — Encounter (HOSPITAL_COMMUNITY)
Admission: RE | Admit: 2022-11-26 | Discharge: 2022-11-26 | Disposition: A | Discharge status: Home / Self Care | Payer: Medicare Other | Source: Ambulatory Visit | Attending: Orthopedic Surgery | Admitting: Orthopedic Surgery

## 2022-11-26 DIAGNOSIS — Z96652 Presence of left artificial knee joint: Secondary | ICD-10-CM | POA: Diagnosis not present

## 2022-11-26 DIAGNOSIS — Z96653 Presence of artificial knee joint, bilateral: Secondary | ICD-10-CM | POA: Diagnosis not present

## 2022-11-26 MED ORDER — TECHNETIUM TC 99M MEDRONATE IV KIT
20.0000 | PACK | Freq: Once | INTRAVENOUS | Status: AC | PRN
  Administered 2022-11-26: 20.4 via INTRAVENOUS

## 2022-12-01 DIAGNOSIS — M170 Bilateral primary osteoarthritis of knee: Secondary | ICD-10-CM | POA: Diagnosis not present

## 2022-12-01 DIAGNOSIS — M1712 Unilateral primary osteoarthritis, left knee: Secondary | ICD-10-CM | POA: Diagnosis not present

## 2022-12-01 DIAGNOSIS — M1711 Unilateral primary osteoarthritis, right knee: Secondary | ICD-10-CM | POA: Diagnosis not present

## 2022-12-02 ENCOUNTER — Telehealth: Payer: Self-pay | Admitting: Pharmacy Technician

## 2022-12-02 DIAGNOSIS — E538 Deficiency of other specified B group vitamins: Secondary | ICD-10-CM | POA: Diagnosis not present

## 2022-12-02 NOTE — Telephone Encounter (Signed)
Patient Advocate Encounter   Received notification that prior authorization for Nurtec 75MG  dispersible tablets is required.   PA submitted on 12/02/2022 Key [id number] Insurance  [employer] Part D Electronic Prior Authorization Form Status is pending

## 2022-12-02 NOTE — Telephone Encounter (Signed)
Status of PA in separate encounter 

## 2022-12-09 ENCOUNTER — Other Ambulatory Visit: Payer: Self-pay | Admitting: Neurology

## 2022-12-14 NOTE — Telephone Encounter (Signed)
PA has been APPROVED from 12/02/2022-07/27/2023 

## 2022-12-17 DIAGNOSIS — E65 Localized adiposity: Secondary | ICD-10-CM | POA: Diagnosis not present

## 2022-12-17 DIAGNOSIS — E1169 Type 2 diabetes mellitus with other specified complication: Secondary | ICD-10-CM | POA: Diagnosis not present

## 2022-12-17 DIAGNOSIS — I10 Essential (primary) hypertension: Secondary | ICD-10-CM | POA: Diagnosis not present

## 2022-12-22 DIAGNOSIS — M25562 Pain in left knee: Secondary | ICD-10-CM | POA: Diagnosis not present

## 2023-01-05 DIAGNOSIS — M5451 Vertebrogenic low back pain: Secondary | ICD-10-CM | POA: Diagnosis not present

## 2023-01-08 DIAGNOSIS — G4733 Obstructive sleep apnea (adult) (pediatric): Secondary | ICD-10-CM | POA: Diagnosis not present

## 2023-01-14 DIAGNOSIS — E1169 Type 2 diabetes mellitus with other specified complication: Secondary | ICD-10-CM | POA: Diagnosis not present

## 2023-01-14 DIAGNOSIS — I10 Essential (primary) hypertension: Secondary | ICD-10-CM | POA: Diagnosis not present

## 2023-01-14 DIAGNOSIS — E65 Localized adiposity: Secondary | ICD-10-CM | POA: Diagnosis not present

## 2023-01-29 DIAGNOSIS — T8482XA Fibrosis due to internal orthopedic prosthetic devices, implants and grafts, initial encounter: Secondary | ICD-10-CM | POA: Diagnosis not present

## 2023-01-29 DIAGNOSIS — Z9889 Other specified postprocedural states: Secondary | ICD-10-CM | POA: Diagnosis not present

## 2023-01-29 DIAGNOSIS — Z96652 Presence of left artificial knee joint: Secondary | ICD-10-CM | POA: Diagnosis not present

## 2023-02-02 DIAGNOSIS — M25562 Pain in left knee: Secondary | ICD-10-CM | POA: Diagnosis not present

## 2023-02-05 ENCOUNTER — Other Ambulatory Visit: Payer: Self-pay | Admitting: Neurology

## 2023-02-05 DIAGNOSIS — M25562 Pain in left knee: Secondary | ICD-10-CM | POA: Diagnosis not present

## 2023-02-05 DIAGNOSIS — G43409 Hemiplegic migraine, not intractable, without status migrainosus: Secondary | ICD-10-CM

## 2023-02-05 DIAGNOSIS — G450 Vertebro-basilar artery syndrome: Secondary | ICD-10-CM

## 2023-02-05 DIAGNOSIS — G43109 Migraine with aura, not intractable, without status migrainosus: Secondary | ICD-10-CM

## 2023-02-05 DIAGNOSIS — R42 Dizziness and giddiness: Secondary | ICD-10-CM

## 2023-02-09 DIAGNOSIS — M25562 Pain in left knee: Secondary | ICD-10-CM | POA: Diagnosis not present

## 2023-02-16 DIAGNOSIS — M25562 Pain in left knee: Secondary | ICD-10-CM | POA: Diagnosis not present

## 2023-02-18 DIAGNOSIS — M797 Fibromyalgia: Secondary | ICD-10-CM | POA: Diagnosis not present

## 2023-02-18 DIAGNOSIS — I10 Essential (primary) hypertension: Secondary | ICD-10-CM | POA: Diagnosis not present

## 2023-02-18 DIAGNOSIS — E119 Type 2 diabetes mellitus without complications: Secondary | ICD-10-CM | POA: Diagnosis not present

## 2023-02-18 DIAGNOSIS — E538 Deficiency of other specified B group vitamins: Secondary | ICD-10-CM | POA: Diagnosis not present

## 2023-02-18 DIAGNOSIS — G43709 Chronic migraine without aura, not intractable, without status migrainosus: Secondary | ICD-10-CM | POA: Diagnosis not present

## 2023-02-18 DIAGNOSIS — E7800 Pure hypercholesterolemia, unspecified: Secondary | ICD-10-CM | POA: Diagnosis not present

## 2023-02-18 DIAGNOSIS — E559 Vitamin D deficiency, unspecified: Secondary | ICD-10-CM | POA: Diagnosis not present

## 2023-02-18 DIAGNOSIS — Z9989 Dependence on other enabling machines and devices: Secondary | ICD-10-CM | POA: Diagnosis not present

## 2023-02-18 DIAGNOSIS — K219 Gastro-esophageal reflux disease without esophagitis: Secondary | ICD-10-CM | POA: Diagnosis not present

## 2023-02-18 DIAGNOSIS — D509 Iron deficiency anemia, unspecified: Secondary | ICD-10-CM | POA: Diagnosis not present

## 2023-02-18 DIAGNOSIS — E1169 Type 2 diabetes mellitus with other specified complication: Secondary | ICD-10-CM | POA: Diagnosis not present

## 2023-02-23 DIAGNOSIS — E65 Localized adiposity: Secondary | ICD-10-CM | POA: Diagnosis not present

## 2023-02-23 DIAGNOSIS — E1169 Type 2 diabetes mellitus with other specified complication: Secondary | ICD-10-CM | POA: Diagnosis not present

## 2023-02-23 DIAGNOSIS — I10 Essential (primary) hypertension: Secondary | ICD-10-CM | POA: Diagnosis not present

## 2023-02-24 DIAGNOSIS — M25562 Pain in left knee: Secondary | ICD-10-CM | POA: Diagnosis not present

## 2023-03-19 ENCOUNTER — Other Ambulatory Visit: Payer: Self-pay | Admitting: Neurology

## 2023-03-19 DIAGNOSIS — G43409 Hemiplegic migraine, not intractable, without status migrainosus: Secondary | ICD-10-CM

## 2023-03-19 DIAGNOSIS — G43109 Migraine with aura, not intractable, without status migrainosus: Secondary | ICD-10-CM

## 2023-03-19 DIAGNOSIS — R42 Dizziness and giddiness: Secondary | ICD-10-CM

## 2023-03-19 DIAGNOSIS — G450 Vertebro-basilar artery syndrome: Secondary | ICD-10-CM

## 2023-03-19 MED ORDER — TIZANIDINE HCL 4 MG PO TABS: 8.0000 mg | ORAL_TABLET | Freq: Every day | ORAL | 1 refills | Status: DC

## 2023-03-19 MED ORDER — EMGALITY 120 MG/ML ~~LOC~~ SOAJ: SUBCUTANEOUS | 5 refills | Status: DC

## 2023-03-19 MED ORDER — NURTEC 75 MG PO TBDP: 75.0000 mg | ORAL_TABLET | Freq: Every day | ORAL | 3 refills | Status: DC | PRN

## 2023-03-19 NOTE — Telephone Encounter (Signed)
Patient called to state that [facility] is closing , new pharmacy is [facility] on [address] and [address] # [phone]

## 2023-03-19 NOTE — Telephone Encounter (Signed)
Per patient wants to have Tizandine called into [facility]. Just one With no refills for now.   Trying to get her mail in pharmacy to pick up her scripts.

## 2023-03-22 MED ORDER — TOPIRAMATE ER 50 MG PO CAP24: 1.0000 | ORAL_CAPSULE | Freq: Every day | ORAL | 3 refills | Status: DC

## 2023-03-22 MED ORDER — TOPIRAMATE ER 100 MG PO CAP24: 1.0000 | ORAL_CAPSULE | Freq: Every day | ORAL | 3 refills | Status: DC

## 2023-03-22 NOTE — Addendum Note (Signed)
Addended by: Leida Lauth on: 03/22/2023 10:48 AM   Modules accepted: Orders

## 2023-03-22 NOTE — Telephone Encounter (Signed)
Per Patient the tronkendi 50 mg wasn't sent to the [facility] and [place].

## 2023-03-31 DIAGNOSIS — E65 Localized adiposity: Secondary | ICD-10-CM | POA: Diagnosis not present

## 2023-03-31 DIAGNOSIS — I10 Essential (primary) hypertension: Secondary | ICD-10-CM | POA: Diagnosis not present

## 2023-03-31 DIAGNOSIS — E1169 Type 2 diabetes mellitus with other specified complication: Secondary | ICD-10-CM | POA: Diagnosis not present

## 2023-04-07 ENCOUNTER — Other Ambulatory Visit: Payer: Self-pay | Admitting: Obstetrics and Gynecology

## 2023-04-07 DIAGNOSIS — Z1231 Encounter for screening mammogram for malignant neoplasm of breast: Secondary | ICD-10-CM

## 2023-04-08 DIAGNOSIS — G4733 Obstructive sleep apnea (adult) (pediatric): Secondary | ICD-10-CM | POA: Diagnosis not present

## 2023-04-13 ENCOUNTER — Telehealth: Payer: Self-pay

## 2023-04-13 ENCOUNTER — Telehealth: Payer: Self-pay | Admitting: Neurology

## 2023-04-13 MED ORDER — TOPIRAMATE ER 50 MG PO CAP24: 1.0000 | ORAL_CAPSULE | Freq: Every day | ORAL | 3 refills | Status: DC

## 2023-04-13 MED ORDER — TOPIRAMATE ER 100 MG PO CAP24: 1.0000 | ORAL_CAPSULE | Freq: Every day | ORAL | 3 refills | Status: DC

## 2023-04-13 NOTE — Telephone Encounter (Signed)
PA needed for Tronkendi 100 mg/50 mg

## 2023-04-13 NOTE — Telephone Encounter (Signed)
[facility] for now on.

## 2023-04-13 NOTE — Telephone Encounter (Signed)
Pt called in stating [facility] doesn't have a prescription for her Trokendi 100mg  or Torkendi 50 mg. [facility] filled it as the generic topiramate, but she is supposed to take the brand name. She wants to make sure the brand name gets sent.

## 2023-04-19 NOTE — Telephone Encounter (Signed)
Pt called in stating a prior auth is needed for the Trokendi 50 mg, she said the 100 mg was received, but the 50 isn't done yet.

## 2023-04-28 DIAGNOSIS — I10 Essential (primary) hypertension: Secondary | ICD-10-CM | POA: Diagnosis not present

## 2023-04-28 DIAGNOSIS — E1169 Type 2 diabetes mellitus with other specified complication: Secondary | ICD-10-CM | POA: Diagnosis not present

## 2023-05-03 ENCOUNTER — Inpatient Hospital Stay
Admission: RE | Admit: 2023-05-03 | Discharge: 2023-05-03 | Discharge status: Home / Self Care | Payer: Medicare Other | Source: Ambulatory Visit

## 2023-05-03 DIAGNOSIS — Z1231 Encounter for screening mammogram for malignant neoplasm of breast: Secondary | ICD-10-CM | POA: Diagnosis not present

## 2023-05-10 NOTE — Progress Notes (Unsigned)
NEUROLOGY FOLLOW UP OFFICE NOTE  LARETTA PYATT [id number]  Assessment/Plan:   Migraine with aura, without status migrainosus, not intractable Hemiplegic migraine Vertigo- vestibular migraine vs BPPV   Migraine prevention:  Emgality, Trokendi XR 150mg  daily  Migraine rescue:  Nurtec PRN Tizanidine 8mg  at bedtime Limit use of pain relievers to no more than 2 days out of week to prevent risk of rebound or medication-overuse headache. Keep headache diary Follow up one year  Subjective:  Lennie Dunnigan is a 57 year old right-handed woman with Paget's disease, osteoarthritis, fibromyalgia, GERD, IBS, hyperlipidemia, carotid artery disease and bilateral knee replacements who follows up for migraine and hemiplegic migraine.  Due to not feeling well, changed appointment to virtual visit.   UPDATE:  Tried Nurtec and Ubrelvy.  Nurtec was better.  It does give her heart burn but nothing too significant.     Intensity:  moderate-severe Duration:  onset of relief in 10 minutes with Nurtec Frequency:  1 a month but may have increased flares (2-3 a week) when she has triggers such as a fibromyalgia flare.     Rescue:  Nurtec Current NSAIDS: ASA 81mg ; Current analgesics: none Current triptans: None Current ergotamine: None Current anti-emetic: Zofran 8 mg Current muscle relaxants: Tizanidine 8 mg at bedtime (to help with sleep) Current anti-anxiolytic: BuSpar Current sleep aide:  Latuda Current Antihypertensive medications: Zepeta Current Antidepressant medications: Sertraline 300 mg Current Anticonvulsant medications: Trokendi XR 150 mg Current anti-CGRP:  Emgality, Nurtec PRN Current Vitamins/Herbal/Supplements: Ferrous sulfate Current Antihistamines/Decongestants: Xyzal Other therapy: Dry needling Hormone: None     Caffeine: No Alcohol: No Smoker: No Diet: Hydrates, no fried foods or dairy, no sweets or soda.  Working with health and wellness clinic to lose weight.   Exercise: Tries to walk.  Just had back surgery. Depression/Anxiety:   Other pain: Fibromyalgia.  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  Recovering well. Sleep hygiene: Started a CPAP for OSA - sleep much improved.   HISTORY: On 10/28/11, she was hospitalized for severe left-sided headache associated with slurred speech, left facial droop and left sided arm and leg weakness and numbness.  Headache lasted a day and weakness lasted a few days but she reports mild residual weakness.  MRI and MRA of head were personally reviewed and were negative for acute stroke.  Carotid doppler showed 40-59% bilateral ICA stenosis with antegrade flow in the vertebral arteries.  Echo showed EF 60% with no PFO or ASD.  She was diagnosed with TIA and started on ASA.  She has subsequently had several other similar episodes.  CT head and MRI brain from 06/02/12, CT head and MRI of brain from 10/28/12, CT head and MRI brain from 06/02/15, and MRI brain from 10/03/15 were all personally reviewed and were unremarkable.  MRI/MRA/MRV of head from 02/28/13 were unremarkable.  Carotid doppler from 06/26/16 showed minor carotid atherosclerosis but no hemodynamically significant stenosis.   She has left sided throbbing headache/left retro-orbital without the weakness as well.  It is severe and associated with nausea and photophobia.  Sometimes preceded by a visual aura of either spotty vision or a yellow strip in her visual field. They last 1 to 2 days. They occur 2 to 3 days per week.  She has 25 headache days per month. Triggers:  Dairy, cigarette smoke Relieving factors:  rest Activity:  Cannot function with severe headache.   She had a hemiplegic migraine on 10/11/17 with left sided  Facial  weakness as well as numbness and tingling of the left face, arm and leg.  She went to the ED as a stroke code.  MRI of brain was normal.  CT perfusion was negative.  CTA of head and neck showed mild right  ICA origin soft and calcified plaque but no intracranial or extracranial large vessel stenosis or occlusion.  On Memorial Day 2023, she had another episode of severe vertigo.  MRI and MRA of brain on 01/15/2022 were unremarkable.     Past NSAIDS:  Naproxen (GI upset), ibuprofen, Cambia (GI upset), Sprix NS (effective but caused nasal burning) Past analgesics:  Excedrin, Tylenol, Percocet (hives), Norco, tramadol (hot flashes) Past abortive triptans:  Relpax Past muscle relaxants: no Past anti-emetic:  Phenergan 25mg , Thorazine (effective for migraine abortive therapy but exacerbated hemiplegic migraines) Past antihypertensive medications:  verapamil 24hr 180mg , HCTZ, lisinopril, atenolol 50mg  Past antidepressant medications:  nortriptyline 50mg  Past anticonvulsant medications:  Depakote, zonisamide, topiramate 100mg  twice daily (effective but caused memory problems and paresthesias), gabapentin 300mg  three times daily Past anti-CGRP:  None Past vitamins/Herbal/Supplements:  no Other past therapy:  Botox    Family history of headache:  mother  PAST MEDICAL HISTORY: Past Medical History:  Diagnosis Date   Acne    Anemia    Anxiety    Asthma    Chronic bronchitis ([facility])    Depression    Diabetes mellitus without complication ([facility]) 2020   type 2   Family history of adverse reaction to anesthesia    pt's mother has hx. of post-op N/V   Fibromyalgia    GERD (gastroesophageal reflux disease)    History of blood transfusion    History of TIA (transient ischemic attack)    x 2; left-sided weakness   Hypercholesterolemia    no current med.   Hypertension    states under control with meds., has been on med. since 06/2011   Keloid 03/2015   umbilical   Migraines    Osteoarthritis    Osteoarthritis    Paget disease of bone    PONV (postoperative nausea and vomiting)    states severe   Seasonal allergies    Sleep apnea 2021   Stroke ([facility]) 10/27/2012   TIA   1- 10/29/11-weakness left  side    MEDICATIONS: Current Outpatient Medications on File Prior to Visit  Medication Sig Dispense Refill   acetaminophen-codeine (TYLENOL #3) 300-30 MG tablet Take 1-2 tablets by mouth every 6 (six) hours as needed for moderate pain. 15 tablet 0   albuterol (PROVENTIL) (2.5 MG/3ML) 0.083% nebulizer solution Take 2.5 mg by nebulization every 6 (six) hours as needed for wheezing or shortness of breath.     albuterol (VENTOLIN HFA) 108 (90 Base) MCG/ACT inhaler Inhale 2 puffs into the lungs every 6 (six) hours as needed for wheezing or shortness of breath.     aspirin EC 81 MG tablet Take 1 tablet (81 mg total) by mouth 2 (two) times daily. (Patient taking differently: Take 81 mg by mouth daily. One a day) 60 tablet 0   benzonatate (TESSALON) 100 MG capsule Take 1 capsule (100 mg total) by mouth every 8 (eight) hours. 21 capsule 0   bisoprolol (ZEBETA) 5 MG tablet TAKE 1 TABLET(5 MG) BY MOUTH DAILY 30 tablet 1   busPIRone (BUSPAR) 15 MG tablet Take 15 mg by mouth 2 (two) times daily.      Cholecalciferol (VITAMIN D3) 125 MCG (5000 UT) CAPS Take 10,000 Units by mouth every Monday, Tuesday, Wednesday,  Thursday, and Friday.     gabapentin (NEURONTIN) 100 MG capsule Take 300 mg by mouth in the morning, at noon, and at bedtime.     Galcanezumab-gnlm (EMGALITY) 120 MG/ML SOAJ INJECT ONE ML into THE SKIN EVERY 30 DAYS 1 mL 5   hydrochlorothiazide (MICROZIDE) 12.5 MG capsule Take 12.5 mg by mouth in the morning.     Ketorolac Tromethamine (SPRIX) 15.75 MG/SPRAY SOLN Place 1 spray into the nose every 6 (six) hours as needed. Max daily dose of 4 doses 9 each 2   ketotifen (ZADITOR) 0.025 % ophthalmic solution 1 drop every 12 (twelve) hours as needed (watery eyes/itching eyes).     LATUDA 20 MG TABS tablet Take 20 mg by mouth at bedtime.     levocetirizine (XYZAL) 5 MG tablet Take 5 mg by mouth at bedtime.     lidocaine (LIDODERM) 5 % Place 1 patch onto the skin daily. Remove & Discard patch within 12 hours  or as directed by MD 30 patch 0   metFORMIN (GLUCOPHAGE-XR) 500 MG 24 hr tablet Take 500 mg by mouth every evening.     mometasone (NASONEX) 50 MCG/ACT nasal spray Place 2 sprays into the nose daily.     montelukast (SINGULAIR) 10 MG tablet Take 10 mg by mouth at bedtime.     pantoprazole (PROTONIX) 40 MG tablet Take 40 mg by mouth 2 (two) times daily before a meal.      Rimegepant Sulfate (NURTEC) 75 MG TBDP Take 1 tablet (75 mg total) by mouth daily as needed. 24 tablet 3   rosuvastatin (CRESTOR) 20 MG tablet Take 20 mg by mouth at bedtime.     Semaglutide, 1 MG/DOSE, (OZEMPIC, 1 MG/DOSE,) 4 MG/3ML SOPN Inject 1 mg into the skin once a week. 3 mL 1   sertraline (ZOLOFT) 100 MG tablet Take 100-200 mg by mouth See admin instructions. Take 1 tablet (100 mg) by mouth in the morning at breakfast & take 2 tablets (200 mg) by mouth at bedtime.     tiZANidine (ZANAFLEX) 4 MG tablet Take 2 tablets (8 mg total) by mouth at bedtime. 180 tablet 1   Topiramate ER (TROKENDI XR) 100 MG CP24 Take 1 capsule (100 mg total) by mouth at bedtime. 90 capsule 3   Topiramate ER (TROKENDI XR) 50 MG CP24 Take 1 capsule (50 mg total) by mouth at bedtime. 90 capsule 3   No current facility-administered medications on file prior to visit.    ALLERGIES: Allergies  Allergen Reactions   Lisinopril Swelling and Other (See Comments)    SWELLING FACE/LIPS/NECK   Latex Rash    Skin peeling   Tape Rash    Skin tears/burns.    FAMILY HISTORY: Family History  Problem Relation Age of Onset   Anesthesia problems Mother        post-op N/V   Anxiety disorder Father    Depression Father    Cervical cancer Maternal Grandmother    Cancer Maternal Grandmother        mouth   Heart attack Maternal Grandmother 36   Hypertension Brother    Heart failure Brother        ICD      Objective:  Blood pressure 128/84, pulse 85, height 5\' 6"  (1.676 m), weight 219 lb (99.3 kg), last menstrual period 10/14/2002, SpO2  99%. General: No acute distress.  Patient appears well-groomed.   Head:  Normocephalic/atraumatic Eyes:  Fundi examined but not visualized Neck: supple, no paraspinal tenderness, full range  of motion Heart:  Regular rate and rhythm Neurological Exam: alert and oriented.  Speech fluent and not dysarthric, language intact.  CN II-XII intact. Bulk and tone normal, muscle strength 5/5 throughout.  Sensation to light touch intact.  Deep tendon reflexes 2+ throughout, toes downgoing.  Finger to nose testing intact.  Gait normal, Romberg negative.   Shon Millet, DO  CC: Jarrett Soho, PA-C

## 2023-05-11 ENCOUNTER — Encounter: Payer: Self-pay | Admitting: Neurology

## 2023-05-11 ENCOUNTER — Ambulatory Visit: Payer: Medicare Other | Admitting: Neurology

## 2023-05-11 VITALS — BP 128/84 | HR 85 | Ht 66.0 in | Wt 219.0 lb

## 2023-05-11 DIAGNOSIS — G43409 Hemiplegic migraine, not intractable, without status migrainosus: Secondary | ICD-10-CM

## 2023-05-11 DIAGNOSIS — G43109 Migraine with aura, not intractable, without status migrainosus: Secondary | ICD-10-CM

## 2023-05-11 DIAGNOSIS — R42 Dizziness and giddiness: Secondary | ICD-10-CM

## 2023-05-11 NOTE — Patient Instructions (Signed)
Continue Emgality every 4 weeks and Trokendi XR 150mg  daily Nurtec as needed Tizanidine 8mg  at bedtime

## 2023-05-13 ENCOUNTER — Encounter: Payer: Self-pay | Admitting: Hematology

## 2023-05-13 ENCOUNTER — Other Ambulatory Visit (HOSPITAL_COMMUNITY): Payer: Self-pay

## 2023-05-13 NOTE — Telephone Encounter (Signed)
PA is not needed for either strengths. The earliest the insurance will pay for the 50mg  is 05/30/23, and the earliest they will pay for the 100mg  is on 05/31/23

## 2023-05-14 ENCOUNTER — Encounter: Payer: Self-pay | Admitting: Neurology

## 2023-05-14 ENCOUNTER — Other Ambulatory Visit: Payer: Self-pay | Admitting: Neurology

## 2023-05-14 MED ORDER — TOPIRAMATE ER 50 MG PO CAP24: 50.0000 mg | ORAL_CAPSULE | Freq: Every day | ORAL | 3 refills | Status: DC

## 2023-05-14 MED ORDER — TOPIRAMATE ER 100 MG PO CAP24: 100.0000 mg | ORAL_CAPSULE | Freq: Every day | ORAL | 3 refills | Status: DC

## 2023-05-14 NOTE — Telephone Encounter (Signed)
Patient advised.

## 2023-05-14 NOTE — Telephone Encounter (Signed)
LMOVM for patient.  

## 2023-05-19 DIAGNOSIS — E119 Type 2 diabetes mellitus without complications: Secondary | ICD-10-CM | POA: Diagnosis not present

## 2023-05-24 DIAGNOSIS — E65 Localized adiposity: Secondary | ICD-10-CM | POA: Diagnosis not present

## 2023-05-24 DIAGNOSIS — E1169 Type 2 diabetes mellitus with other specified complication: Secondary | ICD-10-CM | POA: Diagnosis not present

## 2023-05-24 DIAGNOSIS — I10 Essential (primary) hypertension: Secondary | ICD-10-CM | POA: Diagnosis not present

## 2023-06-08 NOTE — Progress Notes (Unsigned)
NEUROLOGY FOLLOW UP OFFICE NOTE  Wanda [name] [id number]  Assessment/Plan:   Migraine with aura, without status migrainosus, not intractable Hemiplegic migraine Vertigo- vestibular migraine vs BPPV   Migraine prevention:  Emgality, Trokendi XR 150mg  daily *** Migraine rescue:  Nurtec PRN *** Tizanidine 8mg  at bedtime *** Limit use of pain relievers to no more than 2 days out of week to prevent risk of rebound or medication-overuse headache. Keep headache diary Follow up ***  Subjective:  Wanda [name] is a 57 year old right-handed woman with Paget's disease, osteoarthritis, fibromyalgia, GERD, IBS, hyperlipidemia, carotid artery disease and bilateral knee replacements who follows up for migraine and hemiplegic migraine.  Due to not feeling well, changed appointment to virtual visit.   UPDATE:  Last seen 3 weeks ago.  She was doing well at that time. *** Intensity:  moderate-severe Duration:  onset of relief in 10 minutes with Nurtec Frequency:  ***   Rescue:  Nurtec Current NSAIDS: ASA 81mg ; Current analgesics: none Current triptans: None Current ergotamine: None Current anti-emetic: Zofran 8 mg Current muscle relaxants: Tizanidine 8 mg at bedtime (to help with sleep) Current anti-anxiolytic: BuSpar Current sleep aide:  Latuda Current Antihypertensive medications: Zepeta Current Antidepressant medications: Sertraline 300 mg Current Anticonvulsant medications: Trokendi XR 150 mg Current anti-CGRP:  Emgality, Nurtec PRN Current Vitamins/Herbal/Supplements: Ferrous sulfate Current Antihistamines/Decongestants: Xyzal Other therapy: Dry needling Hormone: None     Caffeine: No Alcohol: No Smoker: No Diet: Hydrates, no fried foods or dairy, no sweets or soda.  Working with health and wellness clinic to lose weight.  Exercise: Tries to walk.  Just had back surgery. Depression/Anxiety:   Other pain: Fibromyalgia.  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  Recovering well. Sleep hygiene: Started a CPAP for OSA - sleep much improved.   HISTORY: On 10/28/11, she was hospitalized for severe left-sided headache associated with slurred speech, left facial droop and left sided arm and leg weakness and numbness.  Headache lasted a day and weakness lasted a few days but she reports mild residual weakness.  MRI and MRA of head were personally reviewed and were negative for acute stroke.  Carotid doppler showed 40-59% bilateral ICA stenosis with antegrade flow in the vertebral arteries.  Echo showed EF 60% with no PFO or ASD.  She was diagnosed with TIA and started on ASA.  She has subsequently had several other similar episodes.  CT head and MRI brain from 06/02/12, CT head and MRI of brain from 10/28/12, CT head and MRI brain from 06/02/15, and MRI brain from 10/03/15 were all personally reviewed and were unremarkable.  MRI/MRA/MRV of head from 02/28/13 were unremarkable.  Carotid doppler from 06/26/16 showed minor carotid atherosclerosis but no hemodynamically significant stenosis.   She has left sided throbbing headache/left retro-orbital without the weakness as well.  It is severe and associated with nausea and photophobia.  Sometimes preceded by a visual aura of either spotty vision or a yellow strip in her visual field. They last 1 to 2 days. They occur 2 to 3 days per week.  She has 25 headache days per month. Triggers:  Dairy, cigarette smoke Relieving factors:  rest Activity:  Cannot function with severe headache.   She had a hemiplegic migraine on 10/11/17 with left sided  Facial weakness as well as numbness and tingling of the left face, arm and leg.  She went to the ED as a stroke code.  MRI of brain was  normal.  CT perfusion was negative.  CTA of head and neck showed mild right ICA origin soft and calcified plaque but no intracranial or extracranial large vessel stenosis or occlusion.  On Memorial Day 2023, she had  another episode of severe vertigo.  MRI and MRA of brain on 01/15/2022 were unremarkable.     Past NSAIDS:  Naproxen (GI upset), ibuprofen, Cambia (GI upset), Sprix NS (effective but caused nasal burning) Past analgesics:  Excedrin, Tylenol, Percocet (hives), Norco, tramadol (hot flashes) Past abortive triptans:  Relpax Past muscle relaxants: no Past anti-emetic:  Phenergan 25mg , Thorazine (effective for migraine abortive therapy but exacerbated hemiplegic migraines) Past antihypertensive medications:  verapamil 24hr 180mg , HCTZ, lisinopril, atenolol 50mg  Past antidepressant medications:  nortriptyline 50mg  Past anticonvulsant medications:  Depakote, zonisamide, topiramate 100mg  twice daily (effective but caused memory problems and paresthesias), gabapentin 300mg  three times daily Past anti-CGRP:  None Past vitamins/Herbal/Supplements:  no Other past therapy:  Botox    Family history of headache:  mother  PAST MEDICAL HISTORY: Past Medical History:  Diagnosis Date   Acne    Anemia    Anxiety    Asthma    Chronic bronchitis (HCC)    Depression    Diabetes mellitus without complication (HCC) 2020   type 2   Family history of adverse reaction to anesthesia    pt's mother has hx. of post-op N/V   Fibromyalgia    GERD (gastroesophageal reflux disease)    History of blood transfusion    History of TIA (transient ischemic attack)    x 2; left-sided weakness   Hypercholesterolemia    no current med.   Hypertension    states under control with meds., has been on med. since 06/2011   Keloid 03/2015   umbilical   Migraines    Osteoarthritis    Osteoarthritis    Paget disease of bone    PONV (postoperative nausea and vomiting)    states severe   Seasonal allergies    Sleep apnea 2021   Stroke (HCC) 10/27/2012   TIA   1- 10/29/11-weakness left side    MEDICATIONS: Current Outpatient Medications on File Prior to Visit  Medication Sig Dispense Refill   albuterol (PROVENTIL) (2.5  MG/3ML) 0.083% nebulizer solution Take 2.5 mg by nebulization every 6 (six) hours as needed for wheezing or shortness of breath.     albuterol (VENTOLIN HFA) 108 (90 Base) MCG/ACT inhaler Inhale 2 puffs into the lungs every 6 (six) hours as needed for wheezing or shortness of breath.     aspirin EC 81 MG tablet Take 1 tablet (81 mg total) by mouth 2 (two) times daily. (Patient taking differently: Take 81 mg by mouth daily. One a day) 60 tablet 0   bisoprolol (ZEBETA) 5 MG tablet TAKE 1 TABLET(5 MG) BY MOUTH DAILY 30 tablet 1   busPIRone (BUSPAR) 15 MG tablet Take 15 mg by mouth 2 (two) times daily.      Galcanezumab-gnlm (EMGALITY) 120 MG/ML SOAJ INJECT ONE ML into THE SKIN EVERY 30 DAYS 1 mL 5   hydrochlorothiazide (MICROZIDE) 12.5 MG capsule Take 12.5 mg by mouth in the morning.     Ketorolac Tromethamine (SPRIX) 15.75 MG/SPRAY SOLN Place 1 spray into the nose every 6 (six) hours as needed. Max daily dose of 4 doses 9 each 2   LATUDA 20 MG TABS tablet Take 20 mg by mouth at bedtime.     levocetirizine (XYZAL) 5 MG tablet Take 5 mg by mouth at bedtime.  metFORMIN (GLUCOPHAGE-XR) 500 MG 24 hr tablet Take 500 mg by mouth every evening.     mometasone (NASONEX) 50 MCG/ACT nasal spray Place 2 sprays into the nose daily.     montelukast (SINGULAIR) 10 MG tablet Take 10 mg by mouth at bedtime.     pantoprazole (PROTONIX) 40 MG tablet Take 40 mg by mouth 2 (two) times daily before a meal.      Rimegepant Sulfate (NURTEC) 75 MG TBDP Take 1 tablet (75 mg total) by mouth daily as needed. 24 tablet 3   rosuvastatin (CRESTOR) 20 MG tablet Take 20 mg by mouth at bedtime.     Semaglutide, 1 MG/DOSE, (OZEMPIC, 1 MG/DOSE,) 4 MG/3ML SOPN Inject 1 mg into the skin once a week. 3 mL 1   sertraline (ZOLOFT) 100 MG tablet Take 100-200 mg by mouth See admin instructions. Take 1 tablet (100 mg) by mouth in the morning at breakfast & take 2 tablets (200 mg) by mouth at bedtime.     tiZANidine (ZANAFLEX) 4 MG tablet  Take 2 tablets (8 mg total) by mouth at bedtime. 180 tablet 1   Topiramate ER (TROKENDI XR) 100 MG CP24 Take 1 capsule (100 mg total) by mouth at bedtime. Take with the 50mg  capsule.  GENERIC 90 capsule 3   Topiramate ER (TROKENDI XR) 50 MG CP24 Take 1 capsule (50 mg total) by mouth at bedtime. Take with the 100mg  capsule.  GENERIC 90 capsule 3   No current facility-administered medications on file prior to visit.    ALLERGIES: Allergies  Allergen Reactions   Lisinopril Swelling and Other (See Comments)    SWELLING FACE/LIPS/NECK   Latex Rash    Skin peeling   Tape Rash    Skin tears/burns.    FAMILY HISTORY: Family History  Problem Relation Age of Onset   Anesthesia problems Mother        post-op N/V   Anxiety disorder Father    Depression Father    Cervical cancer Maternal Grandmother    Cancer Maternal Grandmother        mouth   Heart attack Maternal Grandmother 33   Hypertension Brother    Heart failure Brother        ICD      Objective:  *** General: No acute distress.  Patient appears well-groomed.   Head:  Normocephalic/atraumatic Eyes:  Fundi examined but not visualized Neck: supple, no paraspinal tenderness, full range of motion Heart:  Regular rate and rhythm Neurological Exam: ***   Shon Millet, DO  CC: Jarrett Soho, PA-C

## 2023-06-09 ENCOUNTER — Other Ambulatory Visit (INDEPENDENT_AMBULATORY_CARE_PROVIDER_SITE_OTHER): Payer: Medicare Other

## 2023-06-09 ENCOUNTER — Ambulatory Visit: Payer: Medicare Other | Admitting: Neurology

## 2023-06-09 ENCOUNTER — Encounter: Payer: Self-pay | Admitting: Neurology

## 2023-06-09 VITALS — BP 120/71 | HR 85 | Ht 66.0 in | Wt 218.0 lb

## 2023-06-09 DIAGNOSIS — G43111 Migraine with aura, intractable, with status migrainosus: Secondary | ICD-10-CM

## 2023-06-09 LAB — C-REACTIVE PROTEIN: CRP: <1 mg/dL (ref 0.5–20.0)

## 2023-06-09 LAB — SEDIMENTATION RATE: Sed Rate: 16 mm/h (ref 0–30)

## 2023-06-09 MED ORDER — DIPHENHYDRAMINE HCL 50 MG/ML IJ SOLN
50.0000 mg | Freq: Once | INTRAMUSCULAR | Status: AC
  Administered 2023-06-09: 25 mg via INTRAMUSCULAR

## 2023-06-09 MED ORDER — METOCLOPRAMIDE HCL 5 MG/ML IJ SOLN
10.0000 mg | Freq: Once | INTRAMUSCULAR | Status: AC
  Administered 2023-06-09: 10 mg via INTRAMUSCULAR

## 2023-06-09 MED ORDER — PREDNISONE 10 MG PO TABS: ORAL_TABLET | ORAL | 0 refills | Status: DC

## 2023-06-09 MED ORDER — KETOROLAC TROMETHAMINE 60 MG/2ML IM SOLN
60.0000 mg | Freq: Once | INTRAMUSCULAR | Status: AC
  Administered 2023-06-09: 60 mg via INTRAMUSCULAR

## 2023-06-09 NOTE — Patient Instructions (Signed)
Check MRI of brain with and without contrast Check sed rate and CRP Plan to switch from Emgality to Vyepti 300mg  every 3 months Nurtec once daily as needed. Once I look at the blood tests, I will let you know if it is okay to start the prednisone taper.  Do not start until I say it is okay.

## 2023-06-23 DIAGNOSIS — J069 Acute upper respiratory infection, unspecified: Secondary | ICD-10-CM | POA: Diagnosis not present

## 2023-06-23 DIAGNOSIS — Z03818 Encounter for observation for suspected exposure to other biological agents ruled out: Secondary | ICD-10-CM | POA: Diagnosis not present

## 2023-06-28 DIAGNOSIS — J329 Chronic sinusitis, unspecified: Secondary | ICD-10-CM | POA: Diagnosis not present

## 2023-06-30 ENCOUNTER — Other Ambulatory Visit: Payer: Medicare Other

## 2023-07-01 ENCOUNTER — Telehealth: Payer: Self-pay | Admitting: Pharmacy Technician

## 2023-07-01 NOTE — Telephone Encounter (Signed)
Pharmacy Patient Advocate Encounter   Received notification from CoverMyMeds that prior authorization for Nurtec 75mg  is required/requested.   Insurance verification completed.   The patient is insured through [facility] .   Per test claim: PA required; PA submitted to above mentioned insurance via CoverMyMeds Key/confirmation #/EOC [id number] Status is pending

## 2023-07-02 ENCOUNTER — Telehealth: Payer: Self-pay | Admitting: Neurology

## 2023-07-02 DIAGNOSIS — G43111 Migraine with aura, intractable, with status migrainosus: Secondary | ICD-10-CM | POA: Insufficient documentation

## 2023-07-02 NOTE — Telephone Encounter (Signed)
Patient seen 06/08/23, Vypeti 300 mg to start.   Patient advised her [facility] Medicare plan will change 07/28/23.but the member I.D and everything should be the same. She will send a mychart message with a copy of the card.

## 2023-07-02 NOTE — Telephone Encounter (Signed)
Patient is calling about her PA on medications. Patient and Wanda [name] spoke about infusion Vyepti, that will need a PA. She received word nurtec was approved through 07/26/24. The Vyepti is supposed to replace emgality and will need PA.

## 2023-07-06 DIAGNOSIS — E65 Localized adiposity: Secondary | ICD-10-CM | POA: Diagnosis not present

## 2023-07-06 DIAGNOSIS — E1169 Type 2 diabetes mellitus with other specified complication: Secondary | ICD-10-CM | POA: Diagnosis not present

## 2023-07-06 DIAGNOSIS — I10 Essential (primary) hypertension: Secondary | ICD-10-CM | POA: Diagnosis not present

## 2023-07-12 ENCOUNTER — Other Ambulatory Visit (HOSPITAL_COMMUNITY): Payer: Self-pay

## 2023-07-12 NOTE — Telephone Encounter (Signed)
Pharmacy Patient Advocate Encounter  Received notification from [facility] that Prior Authorization for Nurtec 75MG  dispersible tablets has been APPROVED from 07/01/23 to 07/26/24. Ran test claim, Copay is $0. This test claim was processed through [facility]- copay amounts may vary at other pharmacies due to pharmacy/plan contracts, or as the patient moves through the different stages of their insurance plan.   PA #/Case ID/Reference #: PA Case ID #: [id number]

## 2023-07-14 ENCOUNTER — Telehealth: Payer: Self-pay | Admitting: Pharmacy Technician

## 2023-07-14 NOTE — Telephone Encounter (Addendum)
Dr. Everlena Cooper / Zenon Mayo,  Patient has been approved for [facility].  We will get patient scheduled as soon as possible.  Auth Submission: APPROVED Site of care: Site of care: [facility] Payer: [employer] Medication & CPT/J Code(s) submitted: Vyepti (Eptinezumab) [id number] Route of submission (phone, fax, portal): PORTAL Phone # Fax # Auth type: Buy/Bill PB Units/visits requested: 300MG  Q3MONTHS  (4 DOSES) Reference number: [id number] Approval from: 07/05/23 to 07/09/24    Patient is not eligible for co-pay. She has been added to the [facility] waiting list when it becomes available.  Currently it is closed.

## 2023-07-25 DIAGNOSIS — U071 COVID-19: Secondary | ICD-10-CM | POA: Diagnosis not present

## 2023-07-25 DIAGNOSIS — R509 Fever, unspecified: Secondary | ICD-10-CM | POA: Diagnosis not present

## 2023-07-25 DIAGNOSIS — H66001 Acute suppurative otitis media without spontaneous rupture of ear drum, right ear: Secondary | ICD-10-CM | POA: Diagnosis not present

## 2023-07-25 DIAGNOSIS — R0981 Nasal congestion: Secondary | ICD-10-CM | POA: Diagnosis not present

## 2023-07-29 ENCOUNTER — Ambulatory Visit: Payer: Medicare Other

## 2023-07-29 ENCOUNTER — Inpatient Hospital Stay: Admission: RE | Admit: 2023-07-29 | Payer: Medicare Other | Source: Ambulatory Visit

## 2023-08-02 DIAGNOSIS — M25552 Pain in left hip: Secondary | ICD-10-CM | POA: Diagnosis not present

## 2023-08-02 DIAGNOSIS — M25551 Pain in right hip: Secondary | ICD-10-CM | POA: Diagnosis not present

## 2023-08-10 ENCOUNTER — Ambulatory Visit
Admission: RE | Admit: 2023-08-10 | Discharge: 2023-08-10 | Disposition: A | Discharge status: Home / Self Care | Payer: Medicare Other | Source: Ambulatory Visit | Attending: Neurology | Admitting: Neurology

## 2023-08-10 DIAGNOSIS — G43111 Migraine with aura, intractable, with status migrainosus: Secondary | ICD-10-CM

## 2023-08-10 DIAGNOSIS — H539 Unspecified visual disturbance: Secondary | ICD-10-CM | POA: Diagnosis not present

## 2023-08-10 MED ORDER — GADOPICLENOL 0.5 MMOL/ML IV SOLN
10.0000 mL | Freq: Once | INTRAVENOUS | Status: AC | PRN
  Administered 2023-08-10: 10 mL via INTRAVENOUS

## 2023-08-10 NOTE — Progress Notes (Signed)
 Ebony, MRI tech alerted me that Wanda [name] was having a possible reaction to contrast after her MRI. [name] was seated in a wheelchair when I arrived to the MRI area Dr. Gertrude was present talking with her. [name] was alert and oriented times four she expressed while receiving her scan she felt hot, her arms were burning, she had heaviness in her chest, her heart was racing and her throat tightened, all those symptoms had subsided except her arms were itching and burning and her face was reddened, Dr. Gertrude suggested placing her on oxygen but due to patient driving herself deferred giving Benadryl  and to monitor her to see if her symptoms improved. I brought [name] to the nursing area and placed her in the reclining chair she declined the stretcher stating she did not want to lie down, I placed her on pulse oximetry to monitor her sats.she was 100% on 2L after I decreased her to 1 L and her sats remained 100% I discontinued the oxygen. Dr. Gertrude came back over to evaluate her at approximately 1545, at this time her face was no longer red and the itching and burning ina her arms had ceased. Dr. Gertrude explained to her that this reaction will be added to her allergies and that she was safe to drive home as long as her symptoms had subsided. [name] left the clinic at 1604 accompanied by this nurse she was alert and oriented times four with stable gait.

## 2023-08-11 ENCOUNTER — Ambulatory Visit: Payer: Medicare Other

## 2023-08-11 VITALS — BP 107/65 | HR 76 | Temp 98.4°F | Resp 20 | Ht 66.0 in | Wt 217.4 lb

## 2023-08-11 DIAGNOSIS — G43111 Migraine with aura, intractable, with status migrainosus: Secondary | ICD-10-CM

## 2023-08-11 MED ORDER — SODIUM CHLORIDE 0.9 % IV SOLN
300.0000 mg | Freq: Once | INTRAVENOUS | Status: AC
  Administered 2023-08-11: 300 mg via INTRAVENOUS
  Filled 2023-08-11: qty 3

## 2023-08-11 NOTE — Progress Notes (Signed)
 Diagnosis: Migraines  Provider:  Praveen Mannam MD  Procedure: IV Infusion  IV Type: Peripheral, IV Location: L Antecubital  Vyepti  (Eptinezumab -jjmr), Dose: 300 mg  Infusion Start Time: 1018  Infusion Stop Time: 1050  Post Infusion IV Care: Observation period completed and Peripheral IV Discontinued  Discharge: Condition: Good, Destination: Home . AVS Provided  Performed by:  Shirly Dow, RN

## 2023-08-11 NOTE — Patient Instructions (Signed)
 Eptinezumab Injection What is this medication? EPTINEZUMAB (EP ti NEZ ue mab) prevents migraines. It works by blocking a substance in the body that causes migraines. It is a monoclonal antibody. This medicine may be used for other purposes; ask your health care provider or pharmacist if you have questions. COMMON BRAND NAME(S): Vyepti What should I tell my care team before I take this medication? They need to know if you have any of these conditions: An unusual or allergic reaction to eptinezumab, other medications, foods, dyes, or preservatives Pregnant or trying to get pregnant Breast-feeding How should I use this medication? This medication is injected into a vein. It is given by your care team in a hospital or clinic setting. Talk to your care team about the use of this medication in children. Special care may be needed. Overdosage: If you think you have taken too much of this medicine contact a poison control center or emergency room at once. NOTE: This medicine is only for you. Do not share this medicine with others. What if I miss a dose? Keep appointments for follow-up doses. It is important not to miss your dose. Call your care team if you are unable to keep an appointment. What may interact with this medication? Interactions are not expected. This list may not describe all possible interactions. Give your health care provider a list of all the medicines, herbs, non-prescription drugs, or dietary supplements you use. Also tell them if you smoke, drink alcohol, or use illegal drugs. Some items may interact with your medicine. What should I watch for while using this medication? Your condition will be monitored carefully while you are receiving this medication. What side effects may I notice from receiving this medication? Side effects that you should report to your care team as soon as possible: Allergic reactions or angioedema--skin rash, itching or hives, swelling of the face, eyes,  lips, tongue, arms, or legs, trouble swallowing or breathing Side effects that usually do not require medical attention (report to your care team if they continue or are bothersome): Runny or stuffy nose This list may not describe all possible side effects. Call your doctor for medical advice about side effects. You may report side effects to FDA at 1-800-FDA-1088. Where should I keep my medication? This medication is given in a hospital or clinic. It will not be stored at home. NOTE: This sheet is a summary. It may not cover all possible information. If you have questions about this medicine, talk to your doctor, pharmacist, or health care provider.  2024 Elsevier/Gold Standard (2021-09-08 00:00:00)

## 2023-08-17 DIAGNOSIS — Z0000 Encounter for general adult medical examination without abnormal findings: Secondary | ICD-10-CM | POA: Diagnosis not present

## 2023-08-18 DIAGNOSIS — E7800 Pure hypercholesterolemia, unspecified: Secondary | ICD-10-CM | POA: Diagnosis not present

## 2023-08-18 DIAGNOSIS — E559 Vitamin D deficiency, unspecified: Secondary | ICD-10-CM | POA: Diagnosis not present

## 2023-08-18 DIAGNOSIS — M25551 Pain in right hip: Secondary | ICD-10-CM | POA: Diagnosis not present

## 2023-08-18 DIAGNOSIS — I10 Essential (primary) hypertension: Secondary | ICD-10-CM | POA: Diagnosis not present

## 2023-08-18 DIAGNOSIS — J45909 Unspecified asthma, uncomplicated: Secondary | ICD-10-CM | POA: Diagnosis not present

## 2023-08-18 DIAGNOSIS — E1169 Type 2 diabetes mellitus with other specified complication: Secondary | ICD-10-CM | POA: Diagnosis not present

## 2023-08-18 DIAGNOSIS — G4733 Obstructive sleep apnea (adult) (pediatric): Secondary | ICD-10-CM | POA: Diagnosis not present

## 2023-08-18 DIAGNOSIS — M519 Unspecified thoracic, thoracolumbar and lumbosacral intervertebral disc disorder: Secondary | ICD-10-CM | POA: Diagnosis not present

## 2023-08-19 DIAGNOSIS — E65 Localized adiposity: Secondary | ICD-10-CM | POA: Diagnosis not present

## 2023-08-19 DIAGNOSIS — I10 Essential (primary) hypertension: Secondary | ICD-10-CM | POA: Diagnosis not present

## 2023-08-19 DIAGNOSIS — E1169 Type 2 diabetes mellitus with other specified complication: Secondary | ICD-10-CM | POA: Diagnosis not present

## 2023-08-25 ENCOUNTER — Encounter: Payer: Self-pay | Admitting: Neurology

## 2023-08-25 NOTE — Telephone Encounter (Signed)
Pt came in and dropped off the financial assistance paperwork. Forms are in Dr. Moises Blood box

## 2023-08-27 NOTE — Telephone Encounter (Signed)
 done

## 2023-09-22 ENCOUNTER — Telehealth: Payer: Self-pay

## 2023-09-22 NOTE — Telephone Encounter (Signed)
 Patient needs scripts sent to [employer] for Tronkendi XR

## 2023-09-27 ENCOUNTER — Other Ambulatory Visit: Payer: Self-pay

## 2023-09-27 MED ORDER — TOPIRAMATE ER 50 MG PO CAP24: 50.0000 mg | ORAL_CAPSULE | Freq: Every day | ORAL | 3 refills | Status: DC

## 2023-09-27 MED ORDER — TOPIRAMATE ER 100 MG PO CAP24: 100.0000 mg | ORAL_CAPSULE | Freq: Every day | ORAL | 3 refills | Status: DC

## 2023-09-29 ENCOUNTER — Telehealth: Payer: Self-pay | Admitting: Pharmacy Technician

## 2023-09-29 DIAGNOSIS — L508 Other urticaria: Secondary | ICD-10-CM | POA: Diagnosis not present

## 2023-09-29 DIAGNOSIS — L232 Allergic contact dermatitis due to cosmetics: Secondary | ICD-10-CM | POA: Diagnosis not present

## 2023-09-29 NOTE — Telephone Encounter (Signed)
 Sheena,  I have faxed PAP (Free drug) forms for MD signature to fax: [phone].  Please return forms to me once signed.  Thanks Selena Batten

## 2023-09-30 DIAGNOSIS — E1169 Type 2 diabetes mellitus with other specified complication: Secondary | ICD-10-CM | POA: Diagnosis not present

## 2023-09-30 DIAGNOSIS — E65 Localized adiposity: Secondary | ICD-10-CM | POA: Diagnosis not present

## 2023-09-30 DIAGNOSIS — I10 Essential (primary) hypertension: Secondary | ICD-10-CM | POA: Diagnosis not present

## 2023-10-01 ENCOUNTER — Other Ambulatory Visit (HOSPITAL_COMMUNITY): Payer: Self-pay

## 2023-10-01 ENCOUNTER — Telehealth: Payer: Self-pay

## 2023-10-01 NOTE — Telephone Encounter (Signed)
 Patient advised. Via phone and Mychart messgae

## 2023-10-01 NOTE — Telephone Encounter (Signed)
*  [facility]  Pharmacy Patient Advocate Encounter   Received notification from Pt Calls Messages that prior authorization for Trokendi ER 100mg  is required/requested.   Insurance verification completed.   The patient is insured through [place] .   Per test claim: Refill too soon. PA is not needed at this time. Medication was filled 09/20/2023. Next eligible fill date is 10/13/2023.

## 2023-10-01 NOTE — Telephone Encounter (Signed)
 PA request has been Cancelled. New Encounter has been or will be created for follow up. For additional info see Pharmacy Prior Auth telephone encounter from 03/07.

## 2023-10-01 NOTE — Telephone Encounter (Signed)
 Letter received from [employer] PA needed for Tronkendi XR 50 mg and 100 mg

## 2023-10-01 NOTE — Telephone Encounter (Signed)
*  [facility]  Pharmacy Patient Advocate Encounter   Received notification from Pt Calls Messages that prior authorization for Trokendi ER 50mg   is required/requested.   Insurance verification completed.   The patient is insured through [place] .   Per test claim: Refill too soon. PA is not needed at this time. Medication was filled 09/20/2023. Next eligible fill date is 10/13/2023.

## 2023-10-04 ENCOUNTER — Telehealth: Payer: Self-pay | Admitting: Pharmacy Technician

## 2023-10-04 ENCOUNTER — Other Ambulatory Visit (HOSPITAL_COMMUNITY): Payer: Self-pay

## 2023-10-04 NOTE — Telephone Encounter (Signed)
 Received the message from the pt about wanting to request brand Trokendi. Please provide clinical documentation as to why the generic isn't working or isn't appropriate. Thanks!

## 2023-10-04 NOTE — Telephone Encounter (Signed)
 PA request has been Received. New Encounter has been or will be created for follow up. For additional info see Pharmacy Prior Auth telephone encounter from 10/04/23.

## 2023-10-04 NOTE — Telephone Encounter (Signed)
 A user error has taken place: encounter opened in error, closed for administrative reasons.

## 2023-10-05 ENCOUNTER — Telehealth: Payer: Self-pay | Admitting: Neurology

## 2023-10-05 NOTE — Telephone Encounter (Signed)
 [employer] calling to check on PA, when processed please call back [phone]

## 2023-10-20 ENCOUNTER — Other Ambulatory Visit (HOSPITAL_COMMUNITY): Payer: Self-pay

## 2023-10-20 DIAGNOSIS — J3489 Other specified disorders of nose and nasal sinuses: Secondary | ICD-10-CM | POA: Diagnosis not present

## 2023-10-20 NOTE — Telephone Encounter (Signed)
 PA is not needed for this medication. Pt does have a high copay for this medication of $286.93, however PA is not needed.

## 2023-10-20 NOTE — Telephone Encounter (Signed)
 Please see telephone encounter dated 3.7.25.

## 2023-10-21 ENCOUNTER — Other Ambulatory Visit (HOSPITAL_COMMUNITY): Payer: Self-pay

## 2023-10-21 NOTE — Telephone Encounter (Signed)
 Per patient [employer] asked to have the Prior Auth restarted .

## 2023-10-21 NOTE — Telephone Encounter (Signed)
 Per test claims PA is not needed. Insurance will only allow [place] to bill for 30 days supply.

## 2023-10-22 ENCOUNTER — Other Ambulatory Visit (HOSPITAL_COMMUNITY): Payer: Self-pay

## 2023-10-22 NOTE — Telephone Encounter (Signed)
 Per patient please give the insurance at [phone]

## 2023-10-26 ENCOUNTER — Other Ambulatory Visit (HOSPITAL_COMMUNITY): Payer: Self-pay

## 2023-10-26 ENCOUNTER — Telehealth: Payer: Self-pay | Admitting: Pharmacy Technician

## 2023-10-26 NOTE — Telephone Encounter (Signed)
 Called and left msg for pt to call me back [phone], to get a better understanding of what is needed. PA is not needed for either strength of Trokendi.

## 2023-10-26 NOTE — Telephone Encounter (Signed)
 Pharmacy Patient Advocate Encounter   Received notification from Pt Calls Messages that prior authorization for TROKENDI XR 50MG  & 100MG  is required/requested.   Insurance verification completed.   The patient is insured through [facility] .   Pt is seeking pt assistance through the manufacture and needed a approval/denial letter sent to them. PA was submitted for both 50mg  and 100mg  and got an immediate response that there is an active PA on file for both strengths.  TROKENDI XR 100MG  KEY: [id number] APPROVAL #[id number]  1.1.25 TO 12.31.25 TROKENDI XR 50MG  KEY: [id number] APPROVAL #[id number]  1.1.25 TO 12.31.25  Will fax to number provided [phone] ATTN:Katie, and follow up with pt.

## 2023-10-27 ENCOUNTER — Other Ambulatory Visit (HOSPITAL_COMMUNITY): Payer: Self-pay

## 2023-10-29 ENCOUNTER — Other Ambulatory Visit (HOSPITAL_COMMUNITY): Payer: Self-pay

## 2023-10-29 NOTE — Telephone Encounter (Signed)
 Pt has been pursuing patient assistance with the manufacturer. Pt was denied assistance. I spoke with patient and let her know that I am sending a Tier Exception to [employer].   FAX: [phone]

## 2023-11-02 NOTE — Telephone Encounter (Signed)
 Tier exception has been denied due to tiering requests are not allowed for a non-formulary drug. Patient has been made aware.

## 2023-11-05 DIAGNOSIS — L259 Unspecified contact dermatitis, unspecified cause: Secondary | ICD-10-CM | POA: Diagnosis not present

## 2023-11-09 DIAGNOSIS — E1169 Type 2 diabetes mellitus with other specified complication: Secondary | ICD-10-CM | POA: Diagnosis not present

## 2023-11-09 DIAGNOSIS — I10 Essential (primary) hypertension: Secondary | ICD-10-CM | POA: Diagnosis not present

## 2023-11-10 ENCOUNTER — Ambulatory Visit (INDEPENDENT_AMBULATORY_CARE_PROVIDER_SITE_OTHER): Payer: Medicare Other

## 2023-11-10 VITALS — BP 109/73 | HR 74 | Temp 97.8°F | Resp 16 | Ht 66.0 in | Wt 215.8 lb

## 2023-11-10 DIAGNOSIS — G43111 Migraine with aura, intractable, with status migrainosus: Secondary | ICD-10-CM | POA: Diagnosis not present

## 2023-11-10 MED ORDER — SODIUM CHLORIDE 0.9 % IV SOLN
300.0000 mg | Freq: Once | INTRAVENOUS | Status: AC
  Administered 2023-11-10: 300 mg via INTRAVENOUS
  Filled 2023-11-10: qty 3

## 2023-11-10 NOTE — Progress Notes (Signed)
 Diagnosis: Migraine   Provider:  Phyllis Breeze MD  Procedure: IV Infusion  IV Type: Peripheral, IV Location: L Antecubital  Vyepti (Eptinezumab-jjmr), Dose: 300 mg  Infusion Start Time: 1000  Infusion Stop Time: 1040  Post Infusion IV Care: Peripheral IV Discontinued  Discharge: Condition: Good, Destination: Home . AVS Declined  Performed by:  Star East, LPN

## 2023-11-15 NOTE — Progress Notes (Signed)
 Virtual Visit via Video Note  Consent was obtained for video visit:  Yes.   Answered questions that patient had about telehealth interaction:  Yes.   I discussed the limitations, risks, security and privacy concerns of performing an evaluation and management service by telemedicine. I also discussed with the patient that there may be a patient responsible charge related to this service. The patient expressed understanding and agreed to proceed.  Pt location: Home Physician Location: office Name of referring provider:  Darnelle Elders, PA-C I connected with Laruth Pod at patients initiation/request on 11/16/2023 at 10:10 AM EDT by video enabled telemedicine application and verified that I am speaking with the correct person using two identifiers. Pt MRN:  [id number] Pt DOB:  03/04/1966 Video Participants:  Laruth Pod   Assessment/Plan:   Migraine with aura, with status migrainosus, intractable - typical semiology of previous migraines but now right sided and in status migrainosus.  For these reasons, brain imaging warranted. Hemiplegic migraine     Workup: MRI of brain with and without contrast Sed rate and CRP Migraine prevention:  Plan to change from Emgality  to Vyepti  300mg ; continue topiramate  ER 150mg  daily  Migraine rescue:  Nurtec PRN  Tizanidine  8mg  at bedtime  Limit use of pain relievers to no more than 2 days out of week to prevent risk of rebound or medication-overuse headache. Keep headache diary Will provide migraine cocktail today (she has a driver) as well as prednisone  taper. Follow up 5 months.  Subjective:  Wanda [name] is a 58 year old right-handed woman with Paget's disease, osteoarthritis, fibromyalgia, GERD, IBS, hyperlipidemia, carotid artery disease and bilateral knee replacements who follows up for migraine and hemiplegic migraine.  MRI personally reviewed.   UPDATE:  Due to increased migraines, she had a workup.   Sed rate and  CRP on 06/09/2023 were normal, 16 and <1.0 respectively.  MRI of brain with and without contrast on 08/10/2023 was unremarkable.    Insurance will now no longer cover brand name Trokendi .  ***   Rescue:  Nurtec Current NSAIDS: ASA 81mg ; Current analgesics: none Current triptans: None Current ergotamine: None Current anti-emetic: Zofran  8 mg Current muscle relaxants: Tizanidine  8 mg at bedtime (to help with sleep) Current anti-anxiolytic: BuSpar  Current sleep aide:  Latuda  Current Antihypertensive medications: Zepeta Current Antidepressant medications: Sertraline  300 mg Current Anticonvulsant medications: Topiramate  ER 150 mg  Current anti-CGRP:  Emgality , Nurtec PRN Current Vitamins/Herbal/Supplements: Ferrous sulfate  Current Antihistamines/Decongestants: Xyzal Other therapy: Dry needling Hormone: None     Caffeine: No Alcohol: No Smoker: No Diet: Hydrates, no fried foods or dairy, no sweets or soda.  Working with health and wellness clinic to lose weight.  Exercise: Tries to walk.  Just had back surgery. Depression/Anxiety:   Other pain: Fibromyalgia.  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  Recovering well. Sleep hygiene: Started a CPAP for OSA - sleep much improved.   HISTORY: On 10/28/11, she was hospitalized for severe left-sided headache associated with slurred speech, left facial droop and left sided arm and leg weakness and numbness.  Headache lasted a day and weakness lasted a few days but she reports mild residual weakness.  MRI and MRA of head were personally reviewed and were negative for acute stroke.  Carotid doppler showed 40-59% bilateral ICA stenosis with antegrade flow in the vertebral arteries.  Echo showed EF 60% with no PFO or ASD.  She was diagnosed with TIA and started on ASA.  She has subsequently had several other similar episodes.  CT head and MRI brain from 06/02/12, CT head and MRI of brain from 10/28/12, CT head  and MRI brain from 06/02/15, and MRI brain from 10/03/15 were all personally reviewed and were unremarkable.  MRI/MRA/MRV of head from 02/28/13 were unremarkable.  Carotid doppler from 06/26/16 showed minor carotid atherosclerosis but no hemodynamically significant stenosis.   She has left sided throbbing headache/left retro-orbital without the weakness as well.  It is severe and associated with ptosis, nausea and photophobia.  Sometimes preceded by a visual aura of either spotty vision or a yellow strip in her visual field. They last 1 to 2 days. They occur 2 to 3 days per week.  She has 25 headache days per month. Triggers:  Dairy, cigarette smoke Relieving factors:  rest Activity:  Cannot function with severe headache.   She had a hemiplegic migraine on 10/11/17 with left sided  Facial weakness as well as numbness and tingling of the left face, arm and leg.  She went to the ED as a stroke code.  MRI of brain was normal.  CT perfusion was negative.  CTA of head and neck showed mild right ICA origin soft and calcified plaque but no intracranial or extracranial large vessel stenosis or occlusion.   On Memorial Day 2023, she had another episode of severe vertigo.  MRI and MRA of brain on 01/15/2022 were unremarkable.     Past NSAIDS:  Naproxen (GI upset), ibuprofen , Cambia  (GI upset), Sprix  NS (effective but caused nasal burning) Past analgesics:  Excedrin, Tylenol , Percocet (hives), Norco, tramadol  (hot flashes) Past abortive triptans:  Relpax  Past muscle relaxants: no Past anti-emetic:  Phenergan  25mg , Thorazine  (effective for migraine abortive therapy but exacerbated hemiplegic migraines) Past antihypertensive medications:  verapamil 24hr 180mg , HCTZ, lisinopril , atenolol  50mg  Past antidepressant medications:  nortriptyline  50mg  Past anticonvulsant medications:  Depakote , zonisamide , topiramate  100mg  twice daily (effective but caused memory problems and paresthesias), gabapentin  300mg  three times  daily Past anti-CGRP:  None Past vitamins/Herbal/Supplements:  no Other past therapy:  Botox    Family history of headache:  mother   Past Medical History: Past Medical History:  Diagnosis Date   Acne    Anemia    Anxiety    Asthma    Chronic bronchitis (HCC)    Depression    Diabetes mellitus without complication (HCC) 2020   type 2   Family history of adverse reaction to anesthesia    pt's mother has hx. of post-op N/V   Fibromyalgia    GERD (gastroesophageal reflux disease)    History of blood transfusion    History of TIA (transient ischemic attack)    x 2; left-sided weakness   Hypercholesterolemia    no current med.   Hypertension    states under control with meds., has been on med. since 06/2011   Keloid 03/2015   umbilical   Migraines    Osteoarthritis    Osteoarthritis    Paget disease of bone    PONV (postoperative nausea and vomiting)    states severe   Seasonal allergies    Sleep apnea 2021   Stroke (HCC) 10/27/2012   TIA   1- 10/29/11-weakness left side    Medications: Outpatient Encounter Medications as of 11/16/2023  Medication Sig   albuterol  (PROVENTIL ) (2.5 MG/3ML) 0.083% nebulizer solution Take 2.5 mg by nebulization every 6 (six) hours as needed for wheezing or shortness of breath.   albuterol  (VENTOLIN  HFA) 108 (90 Base) MCG/ACT  inhaler Inhale 2 puffs into the lungs every 6 (six) hours as needed for wheezing or shortness of breath.   aspirin  EC 81 MG tablet Take 1 tablet (81 mg total) by mouth 2 (two) times daily. (Patient taking differently: Take 81 mg by mouth daily. One a day)   bisoprolol  (ZEBETA ) 5 MG tablet TAKE 1 TABLET(5 MG) BY MOUTH DAILY   busPIRone  (BUSPAR ) 15 MG tablet Take 15 mg by mouth 2 (two) times daily.    Galcanezumab -gnlm (EMGALITY ) 120 MG/ML SOAJ INJECT ONE ML into THE SKIN EVERY 30 DAYS   hydrochlorothiazide  (MICROZIDE ) 12.5 MG capsule Take 12.5 mg by mouth in the morning.   Ketorolac  Tromethamine  (SPRIX ) 15.75 MG/SPRAY  SOLN Place 1 spray into the nose every 6 (six) hours as needed. Max daily dose of 4 doses   LATUDA  20 MG TABS tablet Take 20 mg by mouth at bedtime.   levocetirizine (XYZAL) 5 MG tablet Take 5 mg by mouth at bedtime.   metFORMIN  (GLUCOPHAGE -XR) 500 MG 24 hr tablet Take 500 mg by mouth every evening.   mometasone  (NASONEX ) 50 MCG/ACT nasal spray Place 2 sprays into the nose daily.   montelukast  (SINGULAIR ) 10 MG tablet Take 10 mg by mouth at bedtime.   pantoprazole  (PROTONIX ) 40 MG tablet Take 40 mg by mouth 2 (two) times daily before a meal.    predniSONE  (DELTASONE ) 10 MG tablet Take 60mg  on day 1, then 50mg  on day 2, the 40mg  on day 3, then 30mg  on day 4, then 20mg  on day 5, then 10mg  on day 6, then STOP   Rimegepant Sulfate (NURTEC) 75 MG TBDP Take 1 tablet (75 mg total) by mouth daily as needed.   rosuvastatin  (CRESTOR ) 20 MG tablet Take 20 mg by mouth at bedtime.   Semaglutide , 1 MG/DOSE, (OZEMPIC , 1 MG/DOSE,) 4 MG/3ML SOPN Inject 1 mg into the skin once a week.   sertraline  (ZOLOFT ) 100 MG tablet Take 100-200 mg by mouth See admin instructions. Take 1 tablet (100 mg) by mouth in the morning at breakfast & take 2 tablets (200 mg) by mouth at bedtime.   tiZANidine  (ZANAFLEX ) 4 MG tablet Take 2 tablets (8 mg total) by mouth at bedtime.   Topiramate  ER (TROKENDI  XR) 100 MG CP24 Take 1 capsule (100 mg total) by mouth at bedtime. Take with the 50mg  capsule.  GENERIC   Topiramate  ER (TROKENDI  XR) 50 MG CP24 Take 1 capsule (50 mg total) by mouth at bedtime. Take with the 100mg  capsule.  GENERIC   No facility-administered encounter medications on file as of 11/16/2023.    Allergies: Allergies  Allergen Reactions   Gadolinium Derivatives Palpitations and Dermatitis    Pt became red and burning in arms, hot, heaviness in chest, tightness in throat, recommend pre medication with benadryl  and prednisone  per Dr. Redge Cancel    Lisinopril  Swelling and Other (See Comments)    SWELLING FACE/LIPS/NECK    Latex Rash    Skin peeling   Tape Rash    Skin tears/burns.    Family History: Family History  Problem Relation Age of Onset   Anesthesia problems Mother        post-op N/V   Anxiety disorder Father    Depression Father    Cervical cancer Maternal Grandmother    Cancer Maternal Grandmother        mouth   Heart attack Maternal Grandmother 65   Hypertension Brother    Heart failure Brother        ICD  Observations/Objective:   *** No acute distress.  Alert and oriented.  Speech fluent and not dysarthric.  Language intact.  Eyes orthophoric on primary gaze.  Face symmetric.   Follow Up Instructions:    -I discussed the assessment and treatment plan with the patient. The patient was provided an opportunity to ask questions and all were answered. The patient agreed with the plan and demonstrated an understanding of the instructions.   The patient was advised to call back or seek an in-person evaluation if the symptoms worsen or if the condition fails to improve as anticipated.   Nathaniel Bald, DO

## 2023-11-16 ENCOUNTER — Telehealth: Payer: Self-pay | Admitting: Pharmacy Technician

## 2023-11-16 ENCOUNTER — Telehealth: Payer: Medicare Other | Admitting: Neurology

## 2023-11-16 ENCOUNTER — Other Ambulatory Visit (HOSPITAL_COMMUNITY): Payer: Self-pay

## 2023-11-16 ENCOUNTER — Encounter: Payer: Self-pay | Admitting: Neurology

## 2023-11-16 ENCOUNTER — Telehealth: Payer: Self-pay

## 2023-11-16 VITALS — BP 106/66 | HR 89 | Ht 66.0 in | Wt 211.0 lb

## 2023-11-16 DIAGNOSIS — G43111 Migraine with aura, intractable, with status migrainosus: Secondary | ICD-10-CM

## 2023-11-16 MED ORDER — UBRELVY 100 MG PO TABS: 1.0000 | ORAL_TABLET | ORAL | 1 refills | Status: DC | PRN

## 2023-11-16 MED ORDER — TOPIRAMATE ER 150 MG PO SPRINKLE CAP24: 150.0000 mg | EXTENDED_RELEASE_CAPSULE | Freq: Every day | ORAL | 5 refills | Status: DC

## 2023-11-16 NOTE — Telephone Encounter (Signed)
 Faxed office section of PAP forms for Ubrelvy  and Qudexy  to [phone]. Please fax back once completed so that I can attach other needed documents and fax to respective companies. Spoke with patient about required information, and consent to sign on her behalf.

## 2023-11-16 NOTE — Telephone Encounter (Signed)
 No PA is needed. Patient assistance forms have been faxed over for Qudexy  and Ubrelvy  to [phone]. Copay for Qudexy  is $280.44.

## 2023-11-16 NOTE — Telephone Encounter (Signed)
 Pt needs a PA for Qudexy 

## 2023-11-16 NOTE — Telephone Encounter (Signed)
 Pharmacy Patient Advocate Encounter   Received notification from Phone that prior authorization for UBRELVY  100MG  is required/requested.   Insurance verification completed.   The patient is insured through [facility] .   Per test claim: PA required; PA submitted to above mentioned insurance via CoverMyMeds Key/confirmation #/EOC [id number] Status is pending

## 2023-11-16 NOTE — Telephone Encounter (Signed)
 Pharmacy Patient Advocate Encounter  Received notification from [employer] that Prior Authorization for UBRELVY  100MG  has been APPROVED from 4.22.25 to 12.31.25. Ran test claim, Copay is $326.92. This test claim was processed through [facility]- copay amounts may vary at other pharmacies due to pharmacy/plan contracts, or as the patient moves through the different stages of their insurance plan.   PA #/Case ID/Reference #: [id number]

## 2023-11-19 NOTE — Telephone Encounter (Signed)
 Pt had a virtual visit on Tuesday and was started on Quedexy and Ubrelvy . I faxed over the patient assistance forms per the patient to the office [phone]. The provider section just has to be filled out and faxed back to me at [phone]. Do I need to fax it again?

## 2023-11-22 NOTE — Telephone Encounter (Signed)
 Perfect. Thank you!

## 2023-11-22 NOTE — Telephone Encounter (Signed)
 Re-faxed to [phone] just now. Should receive it soon.

## 2023-11-23 NOTE — Telephone Encounter (Signed)
 Signed and faxed back.

## 2023-11-25 NOTE — Telephone Encounter (Signed)
 Submitted Patient Assistance Application to [employer] for UBRELVY  100MG  along with provider portion, patient portion, medication list, and insurance card copy. Will update patient when we receive a response.  FAX: [phone]

## 2023-11-25 NOTE — Telephone Encounter (Signed)
 Submitted Patient Assistance Application to [employer] for QUDEXY  XR 150MG  along with provider portion, patient portion, medication list, and insurance card copy. Will update patient when we receive a response.  FAX: [phone]

## 2023-12-14 ENCOUNTER — Encounter: Payer: Self-pay | Admitting: Neurology

## 2023-12-15 ENCOUNTER — Other Ambulatory Visit (HOSPITAL_COMMUNITY): Payer: Self-pay

## 2023-12-15 ENCOUNTER — Other Ambulatory Visit: Payer: Self-pay | Admitting: Neurology

## 2023-12-15 MED ORDER — TOPIRAMATE ER 150 MG PO SPRINKLE CAP24: 150.0000 mg | EXTENDED_RELEASE_CAPSULE | Freq: Every day | ORAL | 1 refills | Status: DC

## 2023-12-21 ENCOUNTER — Other Ambulatory Visit: Payer: Self-pay | Admitting: Neurology

## 2023-12-21 DIAGNOSIS — G450 Vertebro-basilar artery syndrome: Secondary | ICD-10-CM

## 2023-12-21 DIAGNOSIS — R42 Dizziness and giddiness: Secondary | ICD-10-CM

## 2023-12-21 DIAGNOSIS — G43409 Hemiplegic migraine, not intractable, without status migrainosus: Secondary | ICD-10-CM

## 2023-12-21 DIAGNOSIS — G43109 Migraine with aura, not intractable, without status migrainosus: Secondary | ICD-10-CM

## 2023-12-27 DIAGNOSIS — E65 Localized adiposity: Secondary | ICD-10-CM | POA: Diagnosis not present

## 2023-12-27 DIAGNOSIS — I10 Essential (primary) hypertension: Secondary | ICD-10-CM | POA: Diagnosis not present

## 2023-12-27 DIAGNOSIS — E1169 Type 2 diabetes mellitus with other specified complication: Secondary | ICD-10-CM | POA: Diagnosis not present

## 2023-12-30 ENCOUNTER — Telehealth: Payer: Self-pay | Admitting: Pharmacy Technician

## 2023-12-30 NOTE — Telephone Encounter (Signed)
 Spoke with patient last week about connecting patient with [facility] [phone] or [phone]) regarding generic Quedexy. Pt spoke with them and is agreeable with price. Spoke with [facility] this morning, and they attempted to transfer the script but the pharmacy would not give them the transfer. Please fax to [phone] or for faster service e-scribe to [facility] in [place], [place].

## 2024-01-05 ENCOUNTER — Telehealth: Payer: Self-pay

## 2024-01-05 ENCOUNTER — Other Ambulatory Visit: Payer: Self-pay | Admitting: Neurology

## 2024-01-05 MED ORDER — TOPIRAMATE ER 150 MG PO SPRINKLE CAP24: 150.0000 mg | EXTENDED_RELEASE_CAPSULE | Freq: Every day | ORAL | 1 refills | Status: DC

## 2024-01-05 NOTE — Telephone Encounter (Signed)
 PER PA team, Was the prescription for Quedexy faxed or ePrescribed to [facility]?   ePrescribe Number  Surescripts NCPDP ID [id number]   Fax Number  [phone]    Script sent today.

## 2024-01-18 NOTE — Telephone Encounter (Signed)
 Called [employer] to check status of Ubrelvy  app that I faxed denial letter to on 6.10.25. They said they have not received it. Asked about an alternate fax number from rep. Rep insisted I fax again to number provided. Will follow up on Thursday 6.26.25.

## 2024-01-24 NOTE — Telephone Encounter (Signed)
 Received fax/notification from [employer] for UBRELVY  patient assistance. Patient's application has been APPROVED.  Effective Dates: 6.30.25 to 12.31.25.   Phone: [phone]

## 2024-01-31 ENCOUNTER — Encounter: Payer: Self-pay | Admitting: Neurology

## 2024-01-31 ENCOUNTER — Other Ambulatory Visit: Payer: Self-pay

## 2024-01-31 MED ORDER — UBRELVY 100 MG PO TABS: 1.0000 | ORAL_TABLET | ORAL | 11 refills | Status: DC | PRN

## 2024-01-31 NOTE — Telephone Encounter (Signed)
 Per Patient mychart, Good day. Hope all is well. Apparently [employer] received the prescription for the Ubrelvy  but they're saying it was signed by the nurse and not the doctor. They need a new script which can be verbally submitted by calling them at [phone]. I did leave a message last Wednesday but it was right before closing so I'm not sure if you have had the chance to get to this request yet. I am having frequent headaches and I am out of my PRN medication.    New script sent.

## 2024-02-01 DIAGNOSIS — I10 Essential (primary) hypertension: Secondary | ICD-10-CM | POA: Diagnosis not present

## 2024-02-01 DIAGNOSIS — E65 Localized adiposity: Secondary | ICD-10-CM | POA: Diagnosis not present

## 2024-02-01 DIAGNOSIS — E1169 Type 2 diabetes mellitus with other specified complication: Secondary | ICD-10-CM | POA: Diagnosis not present

## 2024-02-08 DIAGNOSIS — I700 Atherosclerosis of aorta: Secondary | ICD-10-CM | POA: Diagnosis not present

## 2024-02-08 DIAGNOSIS — E538 Deficiency of other specified B group vitamins: Secondary | ICD-10-CM | POA: Diagnosis not present

## 2024-02-08 DIAGNOSIS — Z0000 Encounter for general adult medical examination without abnormal findings: Secondary | ICD-10-CM | POA: Diagnosis not present

## 2024-02-08 DIAGNOSIS — E1169 Type 2 diabetes mellitus with other specified complication: Secondary | ICD-10-CM | POA: Diagnosis not present

## 2024-02-08 DIAGNOSIS — I10 Essential (primary) hypertension: Secondary | ICD-10-CM | POA: Diagnosis not present

## 2024-02-09 ENCOUNTER — Ambulatory Visit

## 2024-02-09 VITALS — BP 119/74 | HR 80 | Temp 98.6°F | Resp 20 | Ht 66.0 in | Wt 219.2 lb

## 2024-02-09 DIAGNOSIS — G43111 Migraine with aura, intractable, with status migrainosus: Secondary | ICD-10-CM

## 2024-02-09 MED ORDER — SODIUM CHLORIDE 0.9 % IV SOLN
300.0000 mg | Freq: Once | INTRAVENOUS | Status: AC
  Administered 2024-02-09: 300 mg via INTRAVENOUS
  Filled 2024-02-09: qty 3

## 2024-02-09 NOTE — Progress Notes (Signed)
 Diagnosis: Migraines  Provider:  Praveen Mannam MD  Procedure: IV Infusion  IV Type: Peripheral, IV Location: L Antecubital  Vyepti  (Eptinezumab -jjmr), Dose: 300 mg  Infusion Start Time: 1018  Infusion Stop Time: 1050  Post Infusion IV Care: Peripheral IV Discontinued  Discharge: Condition: Good, Destination: Home . AVS Declined  Performed by:  Maximiano JONELLE Pouch, LPN

## 2024-02-17 ENCOUNTER — Encounter (HOSPITAL_COMMUNITY): Payer: Self-pay

## 2024-02-17 ENCOUNTER — Emergency Department (HOSPITAL_COMMUNITY)

## 2024-02-17 ENCOUNTER — Other Ambulatory Visit: Payer: Self-pay

## 2024-02-17 ENCOUNTER — Emergency Department (HOSPITAL_COMMUNITY)
Admission: EM | Admit: 2024-02-17 | Discharge: 2024-02-17 | Disposition: A | Discharge status: Home / Self Care | Attending: Emergency Medicine | Admitting: Emergency Medicine

## 2024-02-17 DIAGNOSIS — R1110 Vomiting, unspecified: Secondary | ICD-10-CM | POA: Diagnosis not present

## 2024-02-17 DIAGNOSIS — G43809 Other migraine, not intractable, without status migrainosus: Secondary | ICD-10-CM | POA: Diagnosis not present

## 2024-02-17 DIAGNOSIS — Z8673 Personal history of transient ischemic attack (TIA), and cerebral infarction without residual deficits: Secondary | ICD-10-CM | POA: Diagnosis not present

## 2024-02-17 DIAGNOSIS — I10 Essential (primary) hypertension: Secondary | ICD-10-CM | POA: Insufficient documentation

## 2024-02-17 DIAGNOSIS — R002 Palpitations: Secondary | ICD-10-CM | POA: Insufficient documentation

## 2024-02-17 DIAGNOSIS — J45909 Unspecified asthma, uncomplicated: Secondary | ICD-10-CM | POA: Diagnosis not present

## 2024-02-17 DIAGNOSIS — E119 Type 2 diabetes mellitus without complications: Secondary | ICD-10-CM | POA: Diagnosis not present

## 2024-02-17 LAB — CBC
HCT: 38.3 % (ref 36.0–46.0)
Hemoglobin: 12.5 g/dL (ref 12.0–15.0)
MCH: 29.1 pg (ref 26.0–34.0)
MCHC: 32.6 g/dL (ref 30.0–36.0)
MCV: 89.3 fL (ref 80.0–100.0)
Platelets: 199 K/uL (ref 150–400)
RBC: 4.29 MIL/uL (ref 3.87–5.11)
RDW: 13.3 % (ref 11.5–15.5)
WBC: 9 K/uL (ref 4.0–10.5)
nRBC: 0 % (ref 0.0–0.2)

## 2024-02-17 LAB — TSH: TSH: 2.057 u[IU]/mL (ref 0.350–4.500)

## 2024-02-17 LAB — BASIC METABOLIC PANEL WITH GFR
Anion gap: 9 (ref 5–15)
BUN: 12 mg/dL (ref 6–20)
CO2: 25 mmol/L (ref 22–32)
Calcium: 9.5 mg/dL (ref 8.9–10.3)
Chloride: 109 mmol/L (ref 98–111)
Creatinine, Ser: 1.05 mg/dL — ABNORMAL HIGH (ref 0.44–1.00)
GFR, Estimated: >60 mL/min (ref >60)
Glucose, Bld: 101 mg/dL — ABNORMAL HIGH (ref 70–99)
Potassium: 3.4 mmol/L — ABNORMAL LOW (ref 3.5–5.1)
Sodium: 143 mmol/L (ref 135–145)

## 2024-02-17 LAB — TROPONIN I (HIGH SENSITIVITY)
Troponin I (High Sensitivity): 3 ng/L (ref <18)
Troponin I (High Sensitivity): 3 ng/L (ref <18)

## 2024-02-17 LAB — MAGNESIUM: Magnesium: 2.3 mg/dL (ref 1.7–2.4)

## 2024-02-17 MED ORDER — ONDANSETRON HCL 4 MG/2ML IJ SOLN
4.0000 mg | Freq: Once | INTRAMUSCULAR | Status: AC
  Administered 2024-02-17: 4 mg via INTRAVENOUS
  Filled 2024-02-17: qty 2

## 2024-02-17 MED ORDER — DIPHENHYDRAMINE HCL 50 MG/ML IJ SOLN
25.0000 mg | Freq: Once | INTRAMUSCULAR | Status: AC
  Administered 2024-02-17: 25 mg via INTRAVENOUS
  Filled 2024-02-17: qty 1

## 2024-02-17 MED ORDER — KETOROLAC TROMETHAMINE 30 MG/ML IJ SOLN
30.0000 mg | Freq: Once | INTRAMUSCULAR | Status: AC
  Administered 2024-02-17: 30 mg via INTRAVENOUS
  Filled 2024-02-17: qty 1

## 2024-02-17 NOTE — ED Triage Notes (Signed)
 C/O palpitations for a week and a half. Normal EKG with EMS, EKG showed PACs.  Axox4. VSS. C/O heaviness on chest. Denies SHOB.

## 2024-02-17 NOTE — ED Provider Triage Note (Signed)
 Emergency Medicine Provider Triage Evaluation Note  Wanda [name] , a 58 y.o. female  was evaluated in triage.  Pt complains of worsening heart palpitations. Symptoms worsening for the last week. Saw PCP today without symptoms but palpitations returned on the drive home.   Review of Systems  Positive: Palpitations; chest heaviness Negative:  SOB  Physical Exam  BP (!) 116/56 (BP Location: Left Arm)   Pulse 80   Temp 98.5 F (36.9 C) (Oral)   Resp 15   Ht 5' 6 (1.676 m)   Wt 98 kg   LMP 10/14/2002 (Within Weeks)   SpO2 100%   BMI 34.86 kg/m  Gen:   Awake, no distress   Resp:  Normal effort  MSK:   Moves extremities without difficulty   Medical Decision Making  Medically screening exam initiated at 8:18 PM.  Appropriate orders placed.  Wanda [name] was informed that the remainder of the evaluation will be completed by another provider, this initial triage assessment does not replace that evaluation, and the importance of remaining in the ED until their evaluation is complete.     Wanda Fonda MATSU, MD 02/17/24 2021

## 2024-02-17 NOTE — Discharge Instructions (Signed)
 Please follow-up with the cardiology doctor.  I have put a referral in our system to assist with this.  Return with any new or suddenly worsening symptoms.

## 2024-02-17 NOTE — ED Notes (Signed)
 CCMD called and patient placed on monitor

## 2024-02-17 NOTE — ED Provider Notes (Signed)
 Emergency Department Provider Note   I have reviewed the triage vital signs and the nursing notes.   HISTORY  Chief Complaint Palpitations   HPI Wanda [name] is a 58 y.o. female with past history reviewed below presents to the emergency department for evaluation of heart palpitations.  She has been monitoring symptoms over the past week.  She saw her PCP today who advised monitoring and if symptoms worsen and they would consider referral to cardiology.  She has not experienced syncope or chest pain.  No shortness of breath.  She was not having palpitations at the PCP office but on the ride home began to have more sustained episode of palpitations.  She decided to present to the emergency department for evaluation.  Past Medical History:  Diagnosis Date   Acne    Anemia    Anxiety    Asthma    Chronic bronchitis (HCC)    Depression    Diabetes mellitus without complication (HCC) 2020   type 2   Family history of adverse reaction to anesthesia    pt's mother has hx. of post-op N/V   Fibromyalgia    GERD (gastroesophageal reflux disease)    History of blood transfusion    History of TIA (transient ischemic attack)    x 2; left-sided weakness   Hypercholesterolemia    no current med.   Hypertension    states under control with meds., has been on med. since 06/2011   Keloid 03/2015   umbilical   Migraines    Osteoarthritis    Osteoarthritis    Paget disease of bone    PONV (postoperative nausea and vomiting)    states severe   Seasonal allergies    Sleep apnea 2021   Stroke (HCC) 10/27/2012   TIA   1- 10/29/11-weakness left side    Review of Systems  Constitutional: No fever/chills Cardiovascular: Denies chest pain.  Positive palpitations. Respiratory: Denies shortness of breath. Gastrointestinal: No abdominal pain.  No nausea, no vomiting.  Neurological: Negative for headaches.  ____________________________________________   PHYSICAL EXAM:  VITAL  SIGNS: ED Triage Vitals  Encounter Vitals Group     BP 02/17/24 1843 (!) 116/56     Pulse Rate 02/17/24 1843 80     Resp 02/17/24 1843 15     Temp 02/17/24 1843 98.5 F (36.9 C)     Temp Source 02/17/24 1843 Oral     SpO2 02/17/24 1843 100 %     Weight 02/17/24 1842 216 lb (98 kg)     Height 02/17/24 1842 5' 6 (1.676 m)   Constitutional: Alert and oriented. Well appearing and in no acute distress. Eyes: Conjunctivae are normal. Head: Atraumatic. Nose: No congestion/rhinnorhea. Mouth/Throat: Mucous membranes are moist.  Neck: No stridor.   Cardiovascular: Normal rate, regular rhythm. Good peripheral circulation. Grossly normal heart sounds.   Respiratory: Normal respiratory effort.  No retractions. Lungs CTAB. Gastrointestinal: No distention.  Musculoskeletal: No gross deformities of extremities. Neurologic:  Normal speech and language.  Skin:  Skin is warm, dry and intact. No rash noted.   ____________________________________________   LABS (all labs ordered are listed, but only abnormal results are displayed)  Labs Reviewed  BASIC METABOLIC PANEL WITH GFR - Abnormal; Notable for the following components:      Result Value   Potassium 3.4 (*)    Glucose, Bld 101 (*)    Creatinine, Ser 1.05 (*)    All other components within normal limits  CBC  MAGNESIUM   TSH  TROPONIN I (HIGH SENSITIVITY)  TROPONIN I (HIGH SENSITIVITY)   ____________________________________________  EKG   EKG Interpretation Date/Time:  Thursday February 17 2024 18:51:22 EDT Ventricular Rate:  83 PR Interval:  160 QRS Duration:  84 QT Interval:  376 QTC Calculation: 441 R Axis:   70  Text Interpretation: Normal sinus rhythm Normal ECG When compared with ECG of 07-Jan-2022 08:17, PREVIOUS ECG IS PRESENT Confirmed by Darra Chew [phone]) on 02/17/2024 8:19:34 PM        ____________________________________________  RADIOLOGY  DG Chest 2 View Result Date: 02/17/2024 CLINICAL DATA:   palpitations EXAM: CHEST - 2 VIEW COMPARISON:  Chest x-ray 01/07/2022, CT chest 01/07/2022 FINDINGS: The heart and mediastinal contours are unchanged. No focal consolidation. No pulmonary edema. No pleural effusion. No pneumothorax. No acute osseous abnormality. IMPRESSION: No active cardiopulmonary disease. Electronically Signed   By: Morgane  Naveau M.D.   On: 02/17/2024 20:50    ____________________________________________   PROCEDURES  Procedure(s) performed:   Procedures  None  ____________________________________________   INITIAL IMPRESSION / ASSESSMENT AND PLAN / ED COURSE  Pertinent labs & imaging results that were available during my care of the patient were reviewed by me and considered in my medical decision making (see chart for details).   This patient is Presenting for Evaluation of palpitations, which does require a range of treatment options, and is a complaint that involves a high risk of morbidity and mortality.  The Differential Diagnoses include PACs, PVCs, A-fib, AVNRT, and SVT, etc.  Clinical Laboratory Tests Ordered, included troponin normal.  CBC without anemia.  No acute kidney injury.  Potassium is minimally elevated at 3.4.  TSH and magnesium  normal.  Radiologic Tests Ordered, included CXR. I independently interpreted the images and agree with radiology interpretation.   Cardiac Monitor Tracing which shows NSR.    Social Determinants of Health Risk patient is a non-smoker.   Medical Decision Making: Summary:  Patient presents the emergency department with palpitations.  Normal sinus rhythm on monitor.  I do see occasional PACs but not correlating with symptoms currently.    Reevaluation with update and discussion with patient.  Labs are reassuring.  No ectopy on monitor.  Plan for cardiology referral as outpatient.  Stable for discharge.  Patient's presentation is most consistent with acute presentation with potential threat to life or bodily  function.   Disposition: discharge  ____________________________________________  FINAL CLINICAL IMPRESSION(S) / ED DIAGNOSES  Final diagnoses:  Palpitations  Other migraine without status migrainosus, not intractable     Note:  This document was prepared using Dragon voice recognition software and may include unintentional dictation errors.  Chew Darra, MD, [facility] Emergency Medicine    Kara Melching, Chew MATSU, MD 02/18/24 [phone]

## 2024-02-24 DIAGNOSIS — E7800 Pure hypercholesterolemia, unspecified: Secondary | ICD-10-CM | POA: Diagnosis not present

## 2024-03-01 DIAGNOSIS — I10 Essential (primary) hypertension: Secondary | ICD-10-CM | POA: Diagnosis not present

## 2024-03-01 DIAGNOSIS — E65 Localized adiposity: Secondary | ICD-10-CM | POA: Diagnosis not present

## 2024-03-01 DIAGNOSIS — E1169 Type 2 diabetes mellitus with other specified complication: Secondary | ICD-10-CM | POA: Diagnosis not present

## 2024-03-03 ENCOUNTER — Ambulatory Visit: Attending: Cardiology | Admitting: Physician Assistant

## 2024-03-03 ENCOUNTER — Encounter: Payer: Self-pay | Admitting: Physician Assistant

## 2024-03-03 ENCOUNTER — Ambulatory Visit: Attending: Physician Assistant

## 2024-03-03 VITALS — BP 110/60 | HR 85 | Ht 66.0 in | Wt 221.0 lb

## 2024-03-03 DIAGNOSIS — I10 Essential (primary) hypertension: Secondary | ICD-10-CM | POA: Diagnosis not present

## 2024-03-03 DIAGNOSIS — R002 Palpitations: Secondary | ICD-10-CM | POA: Diagnosis not present

## 2024-03-03 NOTE — Patient Instructions (Signed)
 Medication Instructions:  No changes *If you need a refill on your cardiac medications before your next appointment, please call your pharmacy*  Lab Work: No labs If you have labs (blood work) drawn today and your tests are completely normal, you will receive your results only by: MyChart Message (if you have MyChart) OR A paper copy in the mail If you have any lab test that is abnormal or we need to change your treatment, we will call you to review the results.  Testing/Procedures: Your physician has requested that you have an echocardiogram. Echocardiography is a painless test that uses sound waves to create images of your heart. It provides your doctor with information about the size and shape of your heart and how well your heart's chambers and valves are working. This procedure takes approximately one hour. There are no restrictions for this procedure. Please do NOT wear cologne, perfume, aftershave, or lotions (deodorant is allowed). Please arrive 15 minutes prior to your appointment time.  Please note: We ask at that you not bring children with you during ultrasound (echo/ vascular) testing. Due to room size and safety concerns, children are not allowed in the ultrasound rooms during exams. Our front office staff cannot provide observation of children in our lobby area while testing is being conducted. An adult accompanying a patient to their appointment will only be allowed in the ultrasound room at the discretion of the ultrasound technician under special circumstances. We apologize for any inconvenience.  Follow-Up: At [facility], you and your health needs are our priority.  As part of our continuing mission to provide you with exceptional heart care, our providers are all part of one team.  This team includes your primary Cardiologist (physician) and Advanced Practice Providers or APPs (Physician Assistants and Nurse Practitioners) who all work together to provide you with  the care you need, when you need it.  Your next appointment:   5-6 week(s)  Provider:   Scot Ford, PA-C   We recommend signing up for the patient portal called MyChart.  Sign up information is provided on this After Visit Summary.  MyChart is used to connect with patients for Virtual Visits (Telemedicine).  Patients are able to view lab/test results, encounter notes, upcoming appointments, etc.  Non-urgent messages can be sent to your provider as well.   To learn more about what you can do with MyChart, go to [email or web address].   Other Instructions ZIO AT Long term monitor-Live Telemetry  Your physician has requested you wear a ZIO patch monitor for 14 days.  This is a single patch monitor. [employer] supplies one patch monitor per enrollment. Additional  stickers are not available.  Please do not apply patch if you will be having a Nuclear Stress Test, Echocardiogram, Cardiac CT, MRI,  or Chest Xray during the period you would be wearing the monitor. The patch cannot be worn during  these tests. You cannot remove and re-apply the ZIO AT patch monitor.  Your ZIO patch monitor will be mailed 3 day USPS to your address on file. It may take 3-5 days to  receive your monitor after you have been enrolled.  Once you have received your monitor, please review the enclosed instructions. Your monitor has  already been registered assigning a specific monitor serial # to you.   Billing and Patient Assistance Program information  Meredeth has been supplied with any insurance information on record for billing. [employer] offers a sliding scale Patient Assistance Program for patients without insurance,  or whose  insurance does not completely cover the cost of the ZIO patch monitor. You must apply for the  Patient Assistance Program to qualify for the discounted rate. To apply, call [employer] at [phone],  select option 4, select option 2 , ask to apply for the Patient Assistance Program, (you can  request an  interpreter if needed). [employer] will ask your household income and how many people are in your  household. [employer] will quote your out-of-pocket cost based on this information. They will also be able  to set up a 12 month interest free payment plan if needed.  Applying the monitor   Shave hair from upper left chest.  Hold the abrader disc by orange tab. Rub the abrader in 40 strokes over left upper chest as indicated in  your monitor instructions.  Clean area with 4 enclosed alcohol pads. Use all pads to ensure the area is cleaned thoroughly. Let  dry.  Apply patch as indicated in monitor instructions. Patch will be placed under collarbone on left side of  chest with arrow pointing upward.  Rub patch adhesive wings for 2 minutes. Remove the white label marked 1. Remove the white label  marked 2. Rub patch adhesive wings for 2 additional minutes.  While looking in a mirror, press and release button in center of patch. A small green light will flash 3-4  times. This will be your only indicator that the monitor has been turned on.  Do not shower for the first 24 hours. You may shower after the first 24 hours.  Press the button if you feel a symptom. You will hear a small click. Record Date, Time and Symptom in  the Patient Log.   Starting the Gateway  In your kit there is a Audiological scientist box the size of a cellphone. This is Buyer, retail. It transmits all your  recorded data to [facility]. This box must always stay within 10 feet of you. Open the box and push the *  button. There will be a light that blinks orange and then green a few times. When the light stops  blinking, the Gateway is connected to the ZIO patch. Call [employer] at [phone] to confirm your monitor is transmitting.  Returning your monitor  Remove your patch and place it inside the Gateway. In the lower half of the Gateway there is a white  bag with prepaid postage on it. Place Gateway in bag and seal. Mail  package back to [place] as soon as  possible. Your physician should have your final report approximately 7 days after you have mailed back  your monitor. Call [facility] Customer Care at [phone] if you have questions regarding your ZIO AT  patch monitor. Call them immediately if you see an orange light blinking on your monitor.  If your monitor falls off in less than 4 days, contact our Monitor department at [phone]. If your  monitor becomes loose or falls off after 4 days call [employer] at [phone] for suggestions on  securing your monitor

## 2024-03-03 NOTE — Progress Notes (Signed)
  Cardiology Office Note   Date:  03/03/2024  ID:  [name], Wanda 03-07-1966, MRN [id number] PCP: Katina Pfeiffer, PA-C  [facility] Providers Cardiologist:  [facility] - Dr. Verlin  History of Present Illness Wanda [name] is a 58 y.o. female with past medical history of hypertension, hyperlipidemia, prediabetes, obesity, TIA, chronic migraine, depression, lumbar disc disease.  Patient was previously evaluated by Dr. Verlin in October 2013 for hypertension, shortness of breath and tachycardia.  Echocardiogram in April 2013 showed normal EF.  CTA was negative for PE.  Echocardiogram in 2014 also showed normal EF 60-65%, mild LVH, no valvular abnormality.  24-hour monitor showed sinus rhythm, episode of sinus tach, rare PACs.  TSH had to be made aware okay.  Patient was last seen by Dr. Verlin October 2014, she was instructed to follow-up with cardiology service as needed.  There was no pathological reason behind her tachycardia.  No further workup was recommended.  More recently, patient was seen in the ED on 02/13/2024 for evaluation of heart palpitation.  Symptom has been going on for the past week patient metabolic panel showed creatinine of 1.05, potassium 3.4.  Troponin negative x 2.  Chest x-ray was normal.  CBC showed normal red blood cell count.  TSH was normal.  EKG showed a normal sinus rhythm, no significant ST-T wave changes, heart rate 83 bpm.  Patient presents [facility] for [facility] evaluation of palpitation.  Symptom has been going on for several weeks, she described as a pounding sensation in her chest and a tightness in the chest when she laid down.  It does not happen when she exert herself during the day.  Her brother has a history of heart failure in his 18s and is currently V[facility] patient at [facility].  Therefore, she is quite concerned about her heart.  I recommended a 2-week heart monitor and echocardiogram.  On exam, she does not have significant  heart murmur.  She denies significant shortness of breath with activity.  I plan to see the patient back in about 5 to 6 weeks  ROS:   Patient describes pounding sensation in the chest especially when laying down, this does not occur with exertion.  She denies any chest pain or shortness of breath.  Studies Reviewed          Risk Assessment/Calculations           Physical Exam VS:  BP 110/60   Pulse 85   Ht 5' 6 (1.676 m)   Wt 221 lb (100.2 kg)   LMP 10/14/2002 (Within Weeks)   SpO2 97%   BMI 35.67 kg/m        Wt Readings from Last 3 Encounters:  03/03/24 221 lb (100.2 kg)  02/17/24 216 lb (98 kg)  02/09/24 219 lb 3.2 oz (99.4 kg)    GEN: Well nourished, well developed in no acute distress NECK: No JVD; No carotid bruits CARDIAC: RRR, no murmurs, rubs, gallops RESPIRATORY:  Clear to auscultation without rales, wheezing or rhonchi  ABDOMEN: Soft, non-tender, non-distended EXTREMITIES:  No edema; No deformity   ASSESSMENT AND PLAN  Palpitation: I recommended echocardiogram and a heart monitor.  Will reassess in 5 to 6 weeks  Hypertension: Blood pressure stable.       Dispo: Follow-up in 5 to 6 weeks  Signed, Maleyah Evans, [place]

## 2024-03-03 NOTE — Progress Notes (Unsigned)
 [employer] long term holter monitor serial # [id number] applied to patient using Hydrocolloid strips. Patient also given a bridge with Vermed hypoallergenic electrodes to try if she has a reaction to Hydrocolloid strips.

## 2024-03-10 ENCOUNTER — Other Ambulatory Visit: Payer: Self-pay | Admitting: Medical Genetics

## 2024-03-26 DIAGNOSIS — E7800 Pure hypercholesterolemia, unspecified: Secondary | ICD-10-CM | POA: Diagnosis not present

## 2024-03-27 DIAGNOSIS — R002 Palpitations: Secondary | ICD-10-CM | POA: Diagnosis not present

## 2024-03-31 ENCOUNTER — Ambulatory Visit: Payer: Self-pay | Admitting: Physician Assistant

## 2024-04-05 DIAGNOSIS — B349 Viral infection, unspecified: Secondary | ICD-10-CM | POA: Diagnosis not present

## 2024-04-05 DIAGNOSIS — R051 Acute cough: Secondary | ICD-10-CM | POA: Diagnosis not present

## 2024-04-05 DIAGNOSIS — R0981 Nasal congestion: Secondary | ICD-10-CM | POA: Diagnosis not present

## 2024-04-05 DIAGNOSIS — Z03818 Encounter for observation for suspected exposure to other biological agents ruled out: Secondary | ICD-10-CM | POA: Diagnosis not present

## 2024-04-05 DIAGNOSIS — R5383 Other fatigue: Secondary | ICD-10-CM | POA: Diagnosis not present

## 2024-04-11 ENCOUNTER — Ambulatory Visit (HOSPITAL_COMMUNITY)
Admission: RE | Admit: 2024-04-11 | Discharge: 2024-04-11 | Disposition: A | Discharge status: Home / Self Care | Source: Ambulatory Visit | Attending: Cardiovascular Disease | Admitting: Cardiovascular Disease

## 2024-04-11 DIAGNOSIS — R002 Palpitations: Secondary | ICD-10-CM | POA: Diagnosis not present

## 2024-04-11 DIAGNOSIS — Z8249 Family history of ischemic heart disease and other diseases of the circulatory system: Secondary | ICD-10-CM | POA: Insufficient documentation

## 2024-04-11 DIAGNOSIS — I10 Essential (primary) hypertension: Secondary | ICD-10-CM | POA: Insufficient documentation

## 2024-04-11 LAB — ECHOCARDIOGRAM COMPLETE
Area-P 1/2: 3.78 cm2
S' Lateral: 2.9 cm

## 2024-04-14 ENCOUNTER — Ambulatory Visit

## 2024-04-18 DIAGNOSIS — I10 Essential (primary) hypertension: Secondary | ICD-10-CM | POA: Diagnosis not present

## 2024-04-18 DIAGNOSIS — E1169 Type 2 diabetes mellitus with other specified complication: Secondary | ICD-10-CM | POA: Diagnosis not present

## 2024-04-18 DIAGNOSIS — E65 Localized adiposity: Secondary | ICD-10-CM | POA: Diagnosis not present

## 2024-04-19 ENCOUNTER — Other Ambulatory Visit (HOSPITAL_COMMUNITY)
Admission: RE | Admit: 2024-04-19 | Discharge: 2024-04-19 | Disposition: A | Discharge status: Home / Self Care | Payer: Self-pay | Source: Ambulatory Visit | Attending: Medical Genetics | Admitting: Medical Genetics

## 2024-04-19 ENCOUNTER — Ambulatory Visit: Admitting: Physician Assistant

## 2024-04-25 DIAGNOSIS — E7800 Pure hypercholesterolemia, unspecified: Secondary | ICD-10-CM | POA: Diagnosis not present

## 2024-04-28 LAB — GENECONNECT MOLECULAR SCREEN: Genetic Analysis Overall Interpretation: NEGATIVE

## 2024-05-03 DIAGNOSIS — G4733 Obstructive sleep apnea (adult) (pediatric): Secondary | ICD-10-CM | POA: Diagnosis not present

## 2024-05-10 ENCOUNTER — Ambulatory Visit: Payer: Medicare Other | Admitting: Neurology

## 2024-05-12 ENCOUNTER — Ambulatory Visit

## 2024-05-12 ENCOUNTER — Encounter: Payer: Self-pay | Admitting: Neurology

## 2024-05-12 ENCOUNTER — Telehealth: Payer: Self-pay | Admitting: Pharmacy Technician

## 2024-05-12 ENCOUNTER — Other Ambulatory Visit (HOSPITAL_COMMUNITY): Payer: Self-pay

## 2024-05-12 VITALS — BP 117/72 | HR 75 | Temp 98.1°F | Resp 20 | Ht 66.0 in | Wt 221.6 lb

## 2024-05-12 DIAGNOSIS — G43111 Migraine with aura, intractable, with status migrainosus: Secondary | ICD-10-CM

## 2024-05-12 MED ORDER — SODIUM CHLORIDE 0.9 % IV SOLN
300.0000 mg | Freq: Once | INTRAVENOUS | Status: AC
  Administered 2024-05-12: 300 mg via INTRAVENOUS
  Filled 2024-05-12: qty 3

## 2024-05-12 NOTE — Progress Notes (Signed)
 Diagnosis: Migraines  Provider:  Praveen Mannam MD  Procedure: IV Infusion  IV Type: Peripheral, IV Location: L Antecubital  Vyepti  (Eptinezumab -jjmr), Dose: 300 mg  Infusion Start Time: 1042  Infusion Stop Time: 1116  Post Infusion IV Care: Peripheral IV Discontinued  Discharge: Condition: Good, Destination: Home . AVS Declined  Performed by:  Maximiano JONELLE Pouch, LPN

## 2024-05-12 NOTE — Telephone Encounter (Signed)
 Please see 10.17.25 encounter. Provider section has been faxed to [phone].

## 2024-05-12 NOTE — Telephone Encounter (Signed)
 Received renewal application from [employer] for UBRELVY  patient assistance. Please complete office portion including a wet signature and fax back to [phone].  Pt would also need a new prescription for Topiramate  sent to [facility] to continue with that discount program.

## 2024-05-15 NOTE — Telephone Encounter (Signed)
 You would do the same thing as with the Tizanidine  that you do for the Topirimate. It would be just sending in a #90 day prescription in to the pharmacy with however many refills and they will contact the patient to set up payment and delivery. They are a like a discount savings pharmacy.

## 2024-05-16 ENCOUNTER — Other Ambulatory Visit: Payer: Self-pay | Admitting: Neurology

## 2024-05-16 DIAGNOSIS — G43109 Migraine with aura, not intractable, without status migrainosus: Secondary | ICD-10-CM

## 2024-05-16 DIAGNOSIS — R42 Dizziness and giddiness: Secondary | ICD-10-CM

## 2024-05-16 DIAGNOSIS — G43409 Hemiplegic migraine, not intractable, without status migrainosus: Secondary | ICD-10-CM

## 2024-05-16 DIAGNOSIS — G450 Vertebro-basilar artery syndrome: Secondary | ICD-10-CM

## 2024-05-16 MED ORDER — TIZANIDINE HCL 4 MG PO TABS: 8.0000 mg | ORAL_TABLET | Freq: Every day | ORAL | 1 refills | Status: DC

## 2024-05-24 ENCOUNTER — Ambulatory Visit: Admitting: Neurology

## 2024-06-19 ENCOUNTER — Other Ambulatory Visit (HOSPITAL_COMMUNITY): Payer: Self-pay

## 2024-06-20 NOTE — Telephone Encounter (Signed)
 Ubrelvy  form has been faxed to [employer]. Will follow up next week with pt.

## 2024-06-20 NOTE — Telephone Encounter (Signed)
 The provider section has been faxed again just need to be reviewed and signed. Once received, I can fax back with the patient section and documents pt has provided.

## 2024-07-06 ENCOUNTER — Other Ambulatory Visit: Payer: Self-pay | Admitting: Family Medicine

## 2024-07-06 DIAGNOSIS — Z1231 Encounter for screening mammogram for malignant neoplasm of breast: Secondary | ICD-10-CM

## 2024-07-13 NOTE — Telephone Encounter (Signed)
 Received fax/notification from [employer] for UBRELVY  patient assistance. Patient's application has been APPROVED.  Effective Dates: 12.17.25 to 12.31.26.   Phone: [phone] Fax: [phone]

## 2024-07-24 NOTE — Progress Notes (Signed)
 Wanda [name]                                          MRN: [id number]   07/24/2024   The VBCI Quality Team Specialist reviewed this patient medical record for the purposes of chart review for care gap closure. The following were reviewed: chart review for care gap closure-kidney health evaluation for diabetes:eGFR  and uACR.    VBCI Quality Team

## 2024-07-26 ENCOUNTER — Telehealth: Payer: Self-pay | Admitting: Pharmacy Technician

## 2024-07-26 NOTE — Telephone Encounter (Addendum)
 Auth Submission: APPROVED PAP: APPROVED Site of care: Site of care: [facility] Payer: [employer] MEDICARE Medication & CPT/J Code(s) submitted: Vyepti  (Eptinezumab ) G6967 Diagnosis Code: G43.111 Route of submission (phone, fax, portal): PORTAL Phone # Fax # Auth type: FREE DRUG Units/visits requested: 300MG  Q3 MONTHS Reference number: [id number] Approval from: 07/26/24 to 07/26/25   F/U: 08/30/24  spoke w/patient in regards to a bill she received for 08/14/24. Patient has been added to penny charge list and bill will be adjusted.  Wanda [name]

## 2024-08-01 ENCOUNTER — Inpatient Hospital Stay: Admission: RE | Admit: 2024-08-01 | Discharge: 2024-08-01 | Attending: Family Medicine | Admitting: Family Medicine

## 2024-08-01 ENCOUNTER — Ambulatory Visit

## 2024-08-01 ENCOUNTER — Ambulatory Visit: Admitting: Neurology

## 2024-08-01 DIAGNOSIS — Z1231 Encounter for screening mammogram for malignant neoplasm of breast: Secondary | ICD-10-CM

## 2024-08-04 NOTE — Progress Notes (Unsigned)
 "  NEUROLOGY FOLLOW UP OFFICE NOTE  Wanda [name] [id number]  Assessment/Plan:   Migraine with aura, with status migrainosus, intractable - typical semiology of previous migraines but now right sided and in status migrainosus.  For these reasons, brain imaging warranted. Hemiplegic migraine     Migraine prevention:  Vyepti  100mg ; Qudexy  150mg  daily Migraine rescue:  Ubrelvy  100mg ; Zofran  8mg  Tizanidine  8mg  at bedtime  Limit use of pain relievers to no more than 9 days out of the month to prevent risk of rebound or medication-overuse headache. Keep headache diary Follow up 6 months.  Subjective:  Wanda [name] is a 59 year old right-handed woman with Paget's disease, osteoarthritis, fibromyalgia, GERD, IBS, hyperlipidemia, carotid artery disease and bilateral knee replacements who follows up for migraine and hemiplegic migraine.     UPDATE:  Doing well.   Duration:  within 1 hour with Ubrelvy  Frequency:  1 every 2 weeks. Next infusion scheduled for next week but has had a daily headache for the last week, behind right eye.       Current NSAIDS: ASA 81mg ; Current analgesics: none Current triptans: None Current ergotamine: None Current anti-emetic: Zofran  8 mg Current muscle relaxants: Tizanidine  8 mg at bedtime (to help with sleep), Robaxin  750mg  Current anti-anxiolytic: BuSpar  Current sleep aide:  Latuda  Current Antihypertensive medications: Zepeta, HCTZ Current Antidepressant medications: Sertraline  100mg  to 200 mg Current Anticonvulsant medications: Qudexy  150 mg daily  Current anti-CGRP:  Vyepti  100mg , Ubrelvy  100mg  Other therapy: Dry needling Other medications:  Benadryl , buspirone , Xyzal     Caffeine: No Alcohol: No Smoker: No Diet: Hydrates, no fried foods or dairy, no sweets or soda.  Working with health and wellness clinic to lose weight.  Exercise: in gym at least twice a week for an hour.  Takes a strengthening/yoga/aerobics class.  If can't go to  the gym, will exercise at home.  Depression/Anxiety:   Other pain: Fibromyalgia.  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  Recovering well. Sleep hygiene: Started a CPAP for OSA - sleep much improved.   HISTORY: On 10/28/11, she was hospitalized for severe left-sided headache associated with slurred speech, left facial droop and left sided arm and leg weakness and numbness.  Headache lasted a day and weakness lasted a few days but she reports mild residual weakness.  MRI and MRA of head were personally reviewed and were negative for acute stroke.  Carotid doppler showed 40-59% bilateral ICA stenosis with antegrade flow in the vertebral arteries.  Echo showed EF 60% with no PFO or ASD.  She was diagnosed with TIA and started on ASA.  She has subsequently had several other similar episodes.  CT head and MRI brain from 06/02/12, CT head and MRI of brain from 10/28/12, CT head and MRI brain from 06/02/15, and MRI brain from 10/03/15 were all personally reviewed and were unremarkable.  MRI/MRA/MRV of head from 02/28/13 were unremarkable.  Carotid doppler from 06/26/16 showed minor carotid atherosclerosis but no hemodynamically significant stenosis.   She has left or right sided throbbing headache/left or right retro-orbital without the weakness as well.  It is severe and associated with ptosis, nausea and photophobia.  Sometimes preceded by a visual aura of either spotty vision or a yellow strip in her visual field. They last 1 to 2 days. They occur 2 to 3 days per week.  She has 25 headache days per month. Triggers:  Dairy, cigarette smoke Relieving factors:  rest Activity:  Cannot function  with severe headache.   She had a hemiplegic migraine on 10/11/17 with left sided  Facial weakness as well as numbness and tingling of the left face, arm and leg.  She went to the ED as a stroke code.  MRI of brain was normal.  CT perfusion was negative.  CTA of head and neck showed  mild right ICA origin soft and calcified plaque but no intracranial or extracranial large vessel stenosis or occlusion.   On Memorial Day 2023, she had another episode of severe vertigo.  MRI and MRA of brain on 01/15/2022 were unremarkable.    Due to increased migraines in late 2024, and now right sided, she had a workup.   Sed rate and CRP on 06/09/2023 were normal, 16 and <1.0 respectively.  MRI of brain with and without contrast on 08/10/2023 was unremarkable.   Past NSAIDS:  Naproxen (GI upset), ibuprofen , Cambia  (GI upset), Sprix  NS (effective but caused nasal burning) Past analgesics:  Excedrin, Tylenol , Percocet (hives), Norco, tramadol  (hot flashes) Past abortive triptans:  Relpax  Past muscle relaxants: no Past anti-emetic:  Phenergan  25mg , Thorazine  (effective for migraine abortive therapy but exacerbated hemiplegic migraines) Past antihypertensive medications:  verapamil 24hr 180mg , HCTZ, lisinopril , atenolol  50mg  Past antidepressant medications:  nortriptyline  50mg  Past anticonvulsant medications:  Depakote , zonisamide , topiramate  100mg  twice daily (effective but caused memory problems and paresthesias), Trokendi  XR 150mg  (effective, not covered), gabapentin  300mg  three times daily Past anti-CGRP:  Emgality , Nurtec PRN Past vitamins/Herbal/Supplements:  no Other past therapy:  Botox    Family history of headache:  mother  PAST MEDICAL HISTORY: Past Medical History:  Diagnosis Date   Acne    Anemia    Anxiety    Asthma    Chronic bronchitis (HCC)    Depression    Diabetes mellitus without complication (HCC) 2020   type 2   Family history of adverse reaction to anesthesia    pt's mother has hx. of post-op N/V   Fibromyalgia    GERD (gastroesophageal reflux disease)    History of blood transfusion    History of TIA (transient ischemic attack)    x 2; left-sided weakness   Hypercholesterolemia    no current med.   Hypertension    states under control with meds., has  been on med. since 06/2011   Keloid 03/2015   umbilical   Migraines    Osteoarthritis    Osteoarthritis    Paget disease of bone    PONV (postoperative nausea and vomiting)    states severe   Seasonal allergies    Sleep apnea 2021   Stroke (HCC) 10/27/2012   TIA   1- 10/29/11-weakness left side    MEDICATIONS: Medications Ordered Prior to Encounter[1]  ALLERGIES: Allergies[2]  FAMILY HISTORY: Family History  Problem Relation Age of Onset   Anesthesia problems Mother        post-op N/V   Anxiety disorder Father    Depression Father    Cervical cancer Maternal Grandmother    Cancer Maternal Grandmother        mouth   Heart attack Maternal Grandmother 55   Hypertension Brother    Heart failure Brother        ICD      Objective:  Blood pressure 105/69, pulse 90, height 5' 6 (1.676 m), weight 221 lb (100.2 kg), last menstrual period 10/14/2002, SpO2 99%. General: No acute distress.  Patient appears well-groomed.   Head:  Normocephalic/atraumatic Eyes:  Fundi examined but not visualized Neck: supple,  no paraspinal tenderness, full range of motion Heart:  Regular rate and rhythm Neurological Exam: alert and oriented.  Speech fluent and not dysarthric, language intact.  CN II-XII intact. Bulk and tone normal, muscle strength 5/5 throughout.  Sensation to light touch intact.  Deep tendon reflexes 2+ throughout, toes downgoing.  Finger to nose testing intact.  Gait normal, Romberg negative.   Juliene Dunnings, DO  CC: Charmaine Bright, PA-C          [1]  Current Outpatient Medications on File Prior to Visit  Medication Sig Dispense Refill   albuterol  (PROVENTIL ) (2.5 MG/3ML) 0.083% nebulizer solution Take 2.5 mg by nebulization every 6 (six) hours as needed for wheezing or shortness of breath.     albuterol  (VENTOLIN  HFA) 108 (90 Base) MCG/ACT inhaler Inhale 2 puffs into the lungs every 6 (six) hours as needed for wheezing or shortness of breath.     aspirin  EC 81 MG  tablet Take 1 tablet (81 mg total) by mouth 2 (two) times daily. (Patient taking differently: Take 81 mg by mouth daily. One a day) 60 tablet 0   bisoprolol  (ZEBETA ) 5 MG tablet TAKE 1 TABLET(5 MG) BY MOUTH DAILY 30 tablet 1   busPIRone  (BUSPAR ) 15 MG tablet Take 15 mg by mouth 2 (two) times daily.      Eptinezumab -jjmr (VYEPTI ) 100 MG/ML injection Infuse Intravenous Every 3 months     hydrochlorothiazide  (MICROZIDE ) 12.5 MG capsule Take 12.5 mg by mouth every other day.     LATUDA  20 MG TABS tablet Take 20 mg by mouth at bedtime.     levocetirizine (XYZAL) 5 MG tablet Take 5 mg by mouth at bedtime.     meloxicam (MOBIC) 7.5 MG tablet Take 7.5 mg by mouth.     metFORMIN  (GLUCOPHAGE -XR) 500 MG 24 hr tablet Take 500 mg by mouth every evening.     methocarbamol  (ROBAXIN ) 750 MG tablet 1 tablet in the morning and at lunch time Orally once a day for 30 days As needed     montelukast  (SINGULAIR ) 10 MG tablet Take 10 mg by mouth at bedtime.     pantoprazole  (PROTONIX ) 40 MG tablet Take 40 mg by mouth 2 (two) times daily before a meal.      rosuvastatin  (CRESTOR ) 20 MG tablet Take 20 mg by mouth at bedtime.     sertraline  (ZOLOFT ) 100 MG tablet Take 100-200 mg by mouth See admin instructions. Take 1 tablet (100 mg) by mouth in the morning at breakfast & take 2 tablets (200 mg) by mouth at bedtime.     tiZANidine  (ZANAFLEX ) 4 MG tablet Take 2 tablets (8 mg total) by mouth at bedtime. 180 tablet 1   topiramate  ER (QUDEXY  XR) 150 MG CS24 sprinkle capsule Take 1 capsule (150 mg total) by mouth daily. 90 capsule 1   Ubrogepant  (UBRELVY ) 100 MG TABS Take 1 tablet (100 mg total) by mouth as needed. May repeat after 2 hours.  Maximum 2 tablets in 24 hours. 16 tablet 11   No current facility-administered medications on file prior to visit.  [2]  Allergies Allergen Reactions   Gadolinium Derivatives Palpitations and Dermatitis    Pt became red and burning in arms, hot, heaviness in chest, tightness in throat,  recommend pre medication with benadryl  and prednisone  per Dr. Gertrude    Lisinopril  Swelling and Other (See Comments)    SWELLING FACE/LIPS/NECK   Latex Rash    Skin peeling   Tape Rash    Skin tears/burns.   "

## 2024-08-07 ENCOUNTER — Encounter: Payer: Self-pay | Admitting: Neurology

## 2024-08-07 ENCOUNTER — Other Ambulatory Visit: Payer: Self-pay | Admitting: Neurology

## 2024-08-07 ENCOUNTER — Ambulatory Visit: Admitting: Neurology

## 2024-08-07 VITALS — BP 105/69 | HR 90 | Ht 66.0 in | Wt 221.0 lb

## 2024-08-07 DIAGNOSIS — G43109 Migraine with aura, not intractable, without status migrainosus: Secondary | ICD-10-CM

## 2024-08-07 MED ORDER — ONDANSETRON 8 MG PO TBDP: 8.0000 mg | ORAL_TABLET | Freq: Three times a day (TID) | ORAL | 5 refills | Status: AC | PRN

## 2024-08-07 MED ORDER — UBRELVY 100 MG PO TABS: 1.0000 | ORAL_TABLET | ORAL | 11 refills | Status: AC | PRN

## 2024-08-07 MED ORDER — UBRELVY 100 MG PO TABS: 1.0000 | ORAL_TABLET | ORAL | 11 refills | Status: DC | PRN

## 2024-08-07 MED ORDER — PREDNISONE 10 MG PO TABS: ORAL_TABLET | ORAL | 0 refills | Status: AC

## 2024-08-07 MED ORDER — TOPIRAMATE ER 150 MG PO SPRINKLE CAP24: 150.0000 mg | EXTENDED_RELEASE_CAPSULE | Freq: Every day | ORAL | 1 refills | Status: DC

## 2024-08-07 NOTE — Patient Instructions (Signed)
 Take prednisone  taper to break current intractable migraine. Vyepti , Qudexi Ubrelvy  as needed.  Zofran -ODT for nausea

## 2024-08-09 ENCOUNTER — Encounter: Payer: Self-pay | Admitting: Neurology

## 2024-08-09 DIAGNOSIS — G43109 Migraine with aura, not intractable, without status migrainosus: Secondary | ICD-10-CM

## 2024-08-09 DIAGNOSIS — G450 Vertebro-basilar artery syndrome: Secondary | ICD-10-CM

## 2024-08-09 DIAGNOSIS — G43409 Hemiplegic migraine, not intractable, without status migrainosus: Secondary | ICD-10-CM

## 2024-08-09 DIAGNOSIS — R42 Dizziness and giddiness: Secondary | ICD-10-CM

## 2024-08-10 MED ORDER — TIZANIDINE HCL 4 MG PO TABS: 8.0000 mg | ORAL_TABLET | Freq: Every day | ORAL | 1 refills | Status: AC

## 2024-08-10 MED ORDER — TOPIRAMATE ER 150 MG PO SPRINKLE CAP24: 150.0000 mg | EXTENDED_RELEASE_CAPSULE | Freq: Every day | ORAL | 1 refills | Status: AC

## 2024-08-14 ENCOUNTER — Ambulatory Visit: Payer: Self-pay | Admitting: *Deleted

## 2024-08-14 VITALS — BP 101/62 | HR 75 | Temp 98.0°F | Resp 18 | Ht 66.0 in | Wt 219.4 lb

## 2024-08-14 DIAGNOSIS — G43111 Migraine with aura, intractable, with status migrainosus: Secondary | ICD-10-CM

## 2024-08-14 MED ORDER — SODIUM CHLORIDE 0.9 % IV SOLN
300.0000 mg | Freq: Once | INTRAVENOUS | Status: AC
  Administered 2024-08-14: 300 mg via INTRAVENOUS
  Filled 2024-08-14: qty 3

## 2024-08-14 NOTE — Progress Notes (Signed)
 Diagnosis: Migraine  Provider:  Mannam, Praveen MD  Procedure: IV Infusion  IV Type: Peripheral, IV Location: L Antecubital   Vyepti  (Eptinezumab -jjmr), Dose: 300 mg  Infusion Start Time: 1013 am  Infusion Stop Time: 1046 am  Post Infusion IV Care: Observation period completed and Peripheral IV Discontinued  Discharge: Condition: Good, Destination: Home . AVS Declined  Performed by:  Trudy Lamarr LABOR, RN

## 2024-11-13 ENCOUNTER — Ambulatory Visit

## 2024-12-12 ENCOUNTER — Ambulatory Visit: Admitting: Neurology

## 2025-02-13 ENCOUNTER — Ambulatory Visit: Payer: Self-pay | Admitting: Neurology
